# Patient Record
Sex: Female | Born: 1950 | Race: Black or African American | Hispanic: No | Marital: Single | Smoking: Never smoker
Health system: Southern US, Community
[De-identification: ages and names within clinical notes are randomized; demographics above are authoritative.]

## PROBLEM LIST (undated history)

## (undated) DIAGNOSIS — R197 Diarrhea, unspecified: Secondary | ICD-10-CM

## (undated) DIAGNOSIS — E669 Obesity, unspecified: Secondary | ICD-10-CM

## (undated) DIAGNOSIS — G960 Cerebrospinal fluid leak: Secondary | ICD-10-CM

## (undated) DIAGNOSIS — I509 Heart failure, unspecified: Secondary | ICD-10-CM

## (undated) DIAGNOSIS — J189 Pneumonia, unspecified organism: Secondary | ICD-10-CM

## (undated) DIAGNOSIS — E7800 Pure hypercholesterolemia, unspecified: Secondary | ICD-10-CM

## (undated) DIAGNOSIS — G9601 Cranial cerebrospinal fluid leak, spontaneous: Secondary | ICD-10-CM

## (undated) DIAGNOSIS — D3A00 Benign carcinoid tumor of unspecified site: Secondary | ICD-10-CM

## (undated) DIAGNOSIS — D509 Iron deficiency anemia, unspecified: Secondary | ICD-10-CM

## (undated) DIAGNOSIS — N289 Disorder of kidney and ureter, unspecified: Secondary | ICD-10-CM

## (undated) DIAGNOSIS — E119 Type 2 diabetes mellitus without complications: Secondary | ICD-10-CM

## (undated) DIAGNOSIS — R6510 Systemic inflammatory response syndrome (SIRS) of non-infectious origin without acute organ dysfunction: Secondary | ICD-10-CM

## (undated) DIAGNOSIS — I10 Essential (primary) hypertension: Secondary | ICD-10-CM

## (undated) HISTORY — PX: KNEE SURGERY: SHX244

## (undated) HISTORY — PX: ABDOMINAL HYSTERECTOMY: SHX81

## (undated) HISTORY — PX: KNEE RECONSTRUCTION, MEDIAL PATELLAR FEMORAL LIGAMENT: SHX1898

---

## 2010-07-09 ENCOUNTER — Emergency Department (INDEPENDENT_AMBULATORY_CARE_PROVIDER_SITE_OTHER): Payer: Medicare Other

## 2010-07-09 ENCOUNTER — Emergency Department (HOSPITAL_BASED_OUTPATIENT_CLINIC_OR_DEPARTMENT_OTHER)
Admission: EM | Admit: 2010-07-09 | Discharge: 2010-07-09 | Disposition: A | Discharge status: Home / Self Care | Payer: Medicare Other | Attending: Emergency Medicine | Admitting: Emergency Medicine

## 2010-07-09 DIAGNOSIS — M25569 Pain in unspecified knee: Secondary | ICD-10-CM

## 2010-07-09 DIAGNOSIS — I10 Essential (primary) hypertension: Secondary | ICD-10-CM | POA: Insufficient documentation

## 2010-07-09 DIAGNOSIS — M1710 Unilateral primary osteoarthritis, unspecified knee: Secondary | ICD-10-CM

## 2010-07-09 DIAGNOSIS — E119 Type 2 diabetes mellitus without complications: Secondary | ICD-10-CM | POA: Insufficient documentation

## 2010-07-09 DIAGNOSIS — E785 Hyperlipidemia, unspecified: Secondary | ICD-10-CM | POA: Insufficient documentation

## 2010-07-09 DIAGNOSIS — Y92009 Unspecified place in unspecified non-institutional (private) residence as the place of occurrence of the external cause: Secondary | ICD-10-CM | POA: Insufficient documentation

## 2010-07-09 DIAGNOSIS — Z79899 Other long term (current) drug therapy: Secondary | ICD-10-CM | POA: Insufficient documentation

## 2010-07-09 DIAGNOSIS — S8000XA Contusion of unspecified knee, initial encounter: Secondary | ICD-10-CM | POA: Insufficient documentation

## 2010-07-09 DIAGNOSIS — W19XXXA Unspecified fall, initial encounter: Secondary | ICD-10-CM | POA: Insufficient documentation

## 2011-09-24 ENCOUNTER — Encounter (HOSPITAL_COMMUNITY): Payer: Self-pay | Admitting: Emergency Medicine

## 2011-09-24 ENCOUNTER — Emergency Department (HOSPITAL_COMMUNITY): Payer: Medicare (Managed Care)

## 2011-09-24 ENCOUNTER — Emergency Department (HOSPITAL_COMMUNITY)
Admission: EM | Admit: 2011-09-24 | Discharge: 2011-09-24 | Disposition: A | Discharge status: Home / Self Care | Payer: Medicare (Managed Care) | Attending: Emergency Medicine | Admitting: Emergency Medicine

## 2011-09-24 DIAGNOSIS — E119 Type 2 diabetes mellitus without complications: Secondary | ICD-10-CM | POA: Insufficient documentation

## 2011-09-24 DIAGNOSIS — Z794 Long term (current) use of insulin: Secondary | ICD-10-CM | POA: Insufficient documentation

## 2011-09-24 DIAGNOSIS — R609 Edema, unspecified: Secondary | ICD-10-CM | POA: Insufficient documentation

## 2011-09-24 DIAGNOSIS — I10 Essential (primary) hypertension: Secondary | ICD-10-CM | POA: Insufficient documentation

## 2011-09-24 DIAGNOSIS — R0602 Shortness of breath: Secondary | ICD-10-CM | POA: Insufficient documentation

## 2011-09-24 HISTORY — DX: Pure hypercholesterolemia, unspecified: E78.00

## 2011-09-24 HISTORY — DX: Disorder of kidney and ureter, unspecified: N28.9

## 2011-09-24 HISTORY — DX: Essential (primary) hypertension: I10

## 2011-09-24 HISTORY — DX: Pneumonia, unspecified organism: J18.9

## 2011-09-24 HISTORY — DX: Obesity, unspecified: E66.9

## 2011-09-24 LAB — CBC
HCT: 28.1 % — ABNORMAL LOW (ref 36.0–46.0)
Hemoglobin: 8.6 g/dL — ABNORMAL LOW (ref 12.0–15.0)
MCH: 27.8 pg (ref 26.0–34.0)
MCHC: 30.6 g/dL (ref 30.0–36.0)
MCV: 90.9 fL (ref 78.0–100.0)
Platelets: 341 10*3/uL (ref 150–400)
RBC: 3.09 MIL/uL — ABNORMAL LOW (ref 3.87–5.11)
RDW: 14.3 % (ref 11.5–15.5)
WBC: 10 10*3/uL (ref 4.0–10.5)

## 2011-09-24 LAB — BASIC METABOLIC PANEL
BUN: 19 mg/dL (ref 6–23)
CO2: 26 mEq/L (ref 19–32)
Calcium: 9.8 mg/dL (ref 8.4–10.5)
Chloride: 107 mEq/L (ref 96–112)
Creatinine, Ser: 1.01 mg/dL (ref 0.50–1.10)
GFR calc Af Amer: 68 mL/min — ABNORMAL LOW (ref >90)
GFR calc non Af Amer: 58 mL/min — ABNORMAL LOW (ref >90)
Glucose, Bld: 94 mg/dL (ref 70–99)
Potassium: 4.5 mEq/L (ref 3.5–5.1)
Sodium: 145 mEq/L (ref 135–145)

## 2011-09-24 LAB — URINALYSIS, ROUTINE W REFLEX MICROSCOPIC
Bilirubin Urine: NEGATIVE
Glucose, UA: NEGATIVE mg/dL
Hgb urine dipstick: NEGATIVE
Ketones, ur: NEGATIVE mg/dL
Leukocytes, UA: NEGATIVE
Nitrite: NEGATIVE
Protein, ur: NEGATIVE mg/dL
Specific Gravity, Urine: 1.017 (ref 1.005–1.030)
Urobilinogen, UA: 0.2 mg/dL (ref 0.0–1.0)
pH: 5.5 (ref 5.0–8.0)

## 2011-09-24 LAB — POCT I-STAT TROPONIN I: Troponin i, poc: 0 ng/mL (ref 0.00–0.08)

## 2011-09-24 MED ORDER — MOXIFLOXACIN HCL 400 MG PO TABS
400.0000 mg | ORAL_TABLET | Freq: Once | ORAL | Status: AC
  Administered 2011-09-24: 400 mg via ORAL
  Filled 2011-09-24: qty 1

## 2011-09-24 MED ORDER — IPRATROPIUM BROMIDE 0.02 % IN SOLN
0.5000 mg | Freq: Once | RESPIRATORY_TRACT | Status: AC
  Administered 2011-09-24: 0.5 mg via RESPIRATORY_TRACT
  Filled 2011-09-24: qty 2.5

## 2011-09-24 MED ORDER — ALBUTEROL SULFATE (5 MG/ML) 0.5% IN NEBU
5.0000 mg | INHALATION_SOLUTION | Freq: Once | RESPIRATORY_TRACT | Status: AC
  Administered 2011-09-24: 5 mg via RESPIRATORY_TRACT
  Filled 2011-09-24: qty 1

## 2011-09-24 MED ORDER — FUROSEMIDE 10 MG/ML IJ SOLN
80.0000 mg | Freq: Once | INTRAMUSCULAR | Status: AC
  Administered 2011-09-24: 80 mg via INTRAMUSCULAR
  Filled 2011-09-24: qty 8

## 2011-09-24 MED ORDER — ALBUTEROL SULFATE (5 MG/ML) 0.5% IN NEBU
INHALATION_SOLUTION | RESPIRATORY_TRACT | Status: AC
  Administered 2011-09-24: 12:00:00
  Filled 2011-09-24: qty 1

## 2011-09-24 MED ORDER — MOXIFLOXACIN HCL 400 MG PO TABS: 400.0000 mg | ORAL_TABLET | Freq: Every day | ORAL | Status: DC

## 2011-09-24 NOTE — ED Provider Notes (Signed)
History     CSN: [id number]  Arrival date & time 09/24/11  1114   First MD Initiated Contact with Patient 09/24/11 1121      Chief Complaint  Patient presents with  . Shortness of Breath    (Consider location/radiation/quality/duration/timing/severity/associated sxs/prior treatment) HPI  The patient is a 61 yo morbidly obese female that presents with SOB.  The patient is normally on a oxygen tank at home which stopped working this morning.  The patient has a history of diabetes, hypertension, CHF, and recent acute renal failure.  The patient has only taken her long acting insulin medication today.  Patient reports some abdominal "pressure" today and leg swelling.   She denies fever, chest pain, headache, nausea, vomiting,cough, constipation, urinary symptoms, and leg pain.   Past Medical History  Diagnosis Date  . Pneumonia   . Renal disorder     renal failure  . Obesity   . Hypertension   . Diabetes mellitus   . High cholesterol     History reviewed. No pertinent past surgical history.  History reviewed. No pertinent family history.  History  Substance Use Topics  . Smoking status: Not on file  . Smokeless tobacco: Not on file  . Alcohol Use:     OB History    Grav Para Term Preterm Abortions TAB SAB Ect Mult Living                  Review of Systems  All other systems negative except as documented in the HPI. All pertinent positives and negatives as reviewed in the HPI.   Allergies  Penicillins  Home Medications  No current outpatient prescriptions on file.  There were no vitals taken for this visit.  Physical Exam  Constitutional: She is oriented to person, place, and time.       The patient is morbidly obese.   HENT:  Head: Normocephalic and atraumatic.  Nose: Nose normal.  Mouth/Throat: Oropharynx is clear and moist.  Eyes: Conjunctivae normal and EOM are normal. Pupils are equal, round, and reactive to light.  Neck: Normal range of motion. No  thyromegaly present.  Cardiovascular: Normal rate, regular rhythm and normal heart sounds.   Pulses:      Carotid pulses are 2+ on the right side, and 2+ on the left side.      Radial pulses are 2+ on the right side, and 2+ on the left side.       Posterior tibial pulses are 1+ on the right side, and 1+ on the left side.  Pulmonary/Chest: No accessory muscle usage. Not tachypneic. No respiratory distress.       Unable to auscultate posterior chest due to lack of patient mobility.  Abdominal: Soft. Bowel sounds are normal. She exhibits no distension and no mass. There is no tenderness. There is no rebound and no guarding.  Musculoskeletal:       2+ pitting edema in bilateral lower extremities  Lymphadenopathy:    She has no cervical adenopathy.  Neurological: She is alert and oriented to person, place, and time.  Skin: Skin is warm. No rash noted. No erythema.  Psychiatric: She has a normal mood and affect. Her behavior is normal. Judgment and thought content normal.      ED Course  Procedures (including critical care time)  Patient has been stable during her ER visit.  The patient is given Avelox due to the fact that there is concern that this could be atelectasis versus pneumonia.  Patient, states she feels, that she is at her normal baseline.  Patient's on 3 L of oxygen at home with rest and 5 L when walking.  Patient family is advised to return to the emergency department for any worsening in her condition.    MDM  MDM Reviewed: nursing note and vitals Interpretation: labs, ECG and x-ray    Date: 09/24/2011  Rate:66  Rhythm: normal sinus rhythm  QRS Axis: normal  Intervals: normal  ST/T Wave abnormalities: nonspecific T wave changes  Conduction Disutrbances:none  Narrative Interpretation:   Old EKG Reviewed: none available           Carlyle Dolly, PA-C 09/24/11 2031

## 2011-09-24 NOTE — ED Notes (Signed)
Main lab phlebotomy called to assess pt for lab draw.

## 2011-09-24 NOTE — ED Notes (Signed)
Main lab collected blood. PA aware of delay.

## 2011-09-24 NOTE — ED Notes (Signed)
IV team unable to start IV. PA to be notified.

## 2011-09-24 NOTE — ED Notes (Signed)
Unsuccessfully attempted to obtain blood for labs x2.RN Stark Jock made aware

## 2011-09-24 NOTE — ED Notes (Signed)
EMS reports pt O2 tank at hom  Not working. saO2 at 86%. Pt complaint of  SOB.

## 2011-09-24 NOTE — ED Notes (Signed)
IV team at bedside. Pt having conversation with family at bedside. States that breathing is better. States it gets worse with slight activity.

## 2011-09-24 NOTE — ED Notes (Signed)
ZOX:[id number]<AV> Expected date:<BR> Expected time:<BR> Means of arrival:Ambulance<BR> Comments:<BR> O2 not working this am; sob; pitting edema to BLE; &gt;500lbs

## 2011-09-24 NOTE — ED Notes (Signed)
Pt beginning to have increasing SOB, Thayer Ohm PA notified. Will continue to monitor pt for changes. IV team paged to start IV due to 2 unsuccessful attempts by RN.

## 2011-09-24 NOTE — ED Notes (Signed)
[facility] contacted to transport pt home.  

## 2011-09-24 NOTE — ED Notes (Signed)
IV team with return page. IV RN on way to assess pt for IV.

## 2011-09-26 NOTE — ED Provider Notes (Signed)
Medical screening examination/treatment/procedure(s) were performed by non-physician practitioner and as supervising physician I was immediately available for consultation/collaboration.   Gavin Pound. Oletta Lamas, MD 09/26/11 2133

## 2011-10-29 ENCOUNTER — Emergency Department (HOSPITAL_COMMUNITY): Payer: Medicare (Managed Care)

## 2011-10-29 ENCOUNTER — Emergency Department (HOSPITAL_COMMUNITY)
Admission: EM | Admit: 2011-10-29 | Discharge: 2011-10-29 | Disposition: A | Discharge status: Home / Self Care | Payer: Medicare (Managed Care) | Attending: Emergency Medicine | Admitting: Emergency Medicine

## 2011-10-29 ENCOUNTER — Encounter (HOSPITAL_COMMUNITY): Payer: Self-pay | Admitting: *Deleted

## 2011-10-29 DIAGNOSIS — I10 Essential (primary) hypertension: Secondary | ICD-10-CM | POA: Insufficient documentation

## 2011-10-29 DIAGNOSIS — Z794 Long term (current) use of insulin: Secondary | ICD-10-CM | POA: Insufficient documentation

## 2011-10-29 DIAGNOSIS — Z9889 Other specified postprocedural states: Secondary | ICD-10-CM | POA: Insufficient documentation

## 2011-10-29 DIAGNOSIS — Z862 Personal history of diseases of the blood and blood-forming organs and certain disorders involving the immune mechanism: Secondary | ICD-10-CM | POA: Insufficient documentation

## 2011-10-29 DIAGNOSIS — R609 Edema, unspecified: Secondary | ICD-10-CM | POA: Insufficient documentation

## 2011-10-29 DIAGNOSIS — E7800 Pure hypercholesterolemia, unspecified: Secondary | ICD-10-CM | POA: Insufficient documentation

## 2011-10-29 DIAGNOSIS — Z79899 Other long term (current) drug therapy: Secondary | ICD-10-CM | POA: Insufficient documentation

## 2011-10-29 DIAGNOSIS — Z8701 Personal history of pneumonia (recurrent): Secondary | ICD-10-CM | POA: Insufficient documentation

## 2011-10-29 DIAGNOSIS — Z90710 Acquired absence of both cervix and uterus: Secondary | ICD-10-CM | POA: Insufficient documentation

## 2011-10-29 DIAGNOSIS — R0602 Shortness of breath: Secondary | ICD-10-CM | POA: Insufficient documentation

## 2011-10-29 DIAGNOSIS — Z8639 Personal history of other endocrine, nutritional and metabolic disease: Secondary | ICD-10-CM | POA: Insufficient documentation

## 2011-10-29 DIAGNOSIS — Z9981 Dependence on supplemental oxygen: Secondary | ICD-10-CM | POA: Insufficient documentation

## 2011-10-29 DIAGNOSIS — E119 Type 2 diabetes mellitus without complications: Secondary | ICD-10-CM | POA: Insufficient documentation

## 2011-10-29 LAB — URINALYSIS, MICROSCOPIC ONLY
Leukocytes, UA: NEGATIVE
Nitrite: NEGATIVE
Specific Gravity, Urine: 1.018 (ref 1.005–1.030)
Urobilinogen, UA: 1 mg/dL (ref 0.0–1.0)

## 2011-10-29 MED ORDER — FUROSEMIDE 10 MG/ML IJ SOLN
100.0000 mg | Freq: Once | INTRAMUSCULAR | Status: DC
  Filled 2011-10-29: qty 10

## 2011-10-29 MED ORDER — FUROSEMIDE 40 MG PO TABS: 40.0000 mg | ORAL_TABLET | Freq: Every day | ORAL | Status: AC

## 2011-10-29 MED ORDER — FUROSEMIDE 80 MG PO TABS
100.0000 mg | ORAL_TABLET | Freq: Once | ORAL | Status: AC
  Administered 2011-10-29: 100 mg via ORAL
  Filled 2011-10-29: qty 1

## 2011-10-29 NOTE — ED Notes (Signed)
[facility] here to transfer pt. 5-6 staff assist

## 2011-10-29 NOTE — ED Provider Notes (Signed)
History     CSN: [id number]  Arrival date & time 10/29/11  1145   First MD Initiated Contact with Patient 10/29/11 1310      Chief Complaint  Patient presents with  . Shortness of Breath    (Consider location/radiation/quality/duration/timing/severity/associated sxs/prior treatment) Patient is a 60 y.o. female presenting with shortness of breath. The history is provided by the patient and a friend. No language interpreter was used.  Shortness of Breath  The current episode started 2 days ago. The onset was gradual. The problem occurs continuously. The problem has been gradually worsening. The problem is moderate. The symptoms are relieved by rest. The symptoms are aggravated by activity. Associated symptoms include shortness of breath. Pertinent negatives include no chest pain, no chest pressure, no fever, no cough and no wheezing. She was not exposed to toxic fumes. Urine output has been normal. The last void occurred less than 6 hours ago. Recently, medical care has been given at another facility (home health ).   61 year old morbidly obese female coming in with increased shortness of breath and no chest pain. States that she is on 40 of Lasix daily and reports edema is worsened over the last 4 days. States that she has had none of her Lasix today. States that Dr. Redmond School is her doctor he came to her house 3 weeks ago. She has had PT OT home health  3 times a week. She does have a nurse to come to the house. No acute distress noted we will order labs and x-ray. Past medical history listed below. O2 sats 90 % on r/a.  Patient is on 3L  at home.    Past Medical History  Diagnosis Date  . Pneumonia   . Renal disorder     renal failure  . Obesity   . Hypertension   . Diabetes mellitus   . High cholesterol     Past Surgical History  Procedure Date  . Abdominal hysterectomy   . Knee reconstruction, medial patellar femoral ligament     No family history on file.  History    Substance Use Topics  . Smoking status: Never Smoker   . Smokeless tobacco: Never Used  . Alcohol Use: No    OB History    Grav Para Term Preterm Abortions TAB SAB Ect Mult Living                  Review of Systems  Constitutional: Negative.  Negative for fever.  HENT: Negative.   Eyes: Negative.   Respiratory: Positive for shortness of breath. Negative for cough and wheezing.   Cardiovascular: Positive for leg swelling. Negative for chest pain and palpitations.  Gastrointestinal: Negative.  Negative for abdominal distention.  Musculoskeletal: Negative.   Skin: Negative.   Neurological: Negative.  Negative for dizziness and light-headedness.  Psychiatric/Behavioral: Negative.   All other systems reviewed and are negative.    Allergies  Penicillins  Home Medications   Current Outpatient Rx  Name Route Sig Dispense Refill  . ALBUTEROL SULFATE HFA 108 (90 BASE) MCG/ACT IN AERS Inhalation Inhale 2 puffs into the lungs every 6 (six) hours as needed. breathing    . AMLODIPINE BESY-BENAZEPRIL HCL 10-20 MG PO CAPS Oral Take 1 capsule by mouth daily.    . ATENOLOL 50 MG PO TABS Oral Take 50 mg by mouth daily.    . BECLOMETHASONE DIPROPIONATE 80 MCG/ACT IN AERS Inhalation Inhale 1 puff into the lungs as needed. breathing    . VITAMIN  D3 2000 UNITS PO TABS Oral Take 1 tablet by mouth daily.    . FUROSEMIDE 40 MG PO TABS Oral Take 40 mg by mouth daily.    Marland Kitchen GABAPENTIN 400 MG PO CAPS Oral Take 400 mg by mouth 3 (three) times daily.    Marland Kitchen HYDRALAZINE HCL 25 MG PO TABS Oral Take 25 mg by mouth 3 (three) times daily.    . INSULIN ASPART 100 UNIT/ML [place] SOLN Subcutaneous Inject 40-50 Units into the skin 3 (three) times daily before meals. Inject 50 units at breakfast and 40 units at  lunch and 40 units at dinner. Pt on sliding scale    . INSULIN GLARGINE 100 UNIT/ML [place] SOLN Subcutaneous Inject 50 Units into the skin 2 (two) times daily. 50 units at breakfast and 50 units at bed time    .  PRAVASTATIN SODIUM 20 MG PO TABS Oral Take 20 mg by mouth daily.    Marland Kitchen SPIRONOLACTONE 25 MG PO TABS Oral Take 25 mg by mouth daily.      BP 133/105  Pulse 51  SpO2 96%  Physical Exam  Nursing note and vitals reviewed. Constitutional: She is oriented to person, place, and time. She appears well-developed and well-nourished.       Morbid obese  HENT:  Head: Normocephalic and atraumatic.  Eyes: Conjunctivae normal and EOM are normal. Pupils are equal, round, and reactive to light.  Neck: Normal range of motion. Neck supple.  Cardiovascular: Normal rate.   Pulmonary/Chest: Effort normal and breath sounds normal.  Abdominal: Soft. Bowel sounds are normal.  Musculoskeletal: Normal range of motion. She exhibits no edema and no tenderness.  Neurological: She is alert and oriented to person, place, and time. She has normal reflexes.  Skin: Skin is warm and dry.  Psychiatric: She has a normal mood and affect.    ED Course  Procedures (including critical care time) Patient is maintaining on 3L  in the ER.  100mg  lasix po with good results.  Case manager at bedside discussing home care options.  Will notify Dr. Redmond School of patients visit to er and followup tomorrow.   Labs Reviewed  URINALYSIS, MICROSCOPIC ONLY - Abnormal; Notable for the following:    APPearance CLOUDY (*)     Bacteria, UA FEW (*)     All other components within normal limits   Dg Chest 2 View  10/29/2011  *RADIOLOGY REPORT*  Clinical Data: Short of breath  CHEST - 2 VIEW  Comparison: 09/24/2011  Findings: The study is significantly limited by obesity.  The heart is enlarged.  There is vascular congestion and bilateral airspace disease compatible with pulmonary edema.  This is unchanged.  No significant pleural effusion  IMPRESSION: .  Congestive heart failure with edema, unchanged   Original Report Authenticated By: Camelia Phenes, M.D.      No diagnosis found.    MDM   61yo mobildy obese with SOB.  Chest x-ray  reviewed by myself and Dr. Adriana Simas shows CHF that is unchanged since last ER visit.  Unable to draw labs or insert IV due to body habitus.  Will increase her lasix.  100mg  po in the ER today ( pharmacist does not recommend 100mg  IM) with insertion of foley catheter per patient request.  Home health notified to see tomorrow and dc foley. Orders faxed to home health nurse.  Dr. Redmond School to see tomorrow. Patient and daughter Spoke with Case manager and discussed need for Nursing home placement and possible CHF clinic.  Will increase her home dose of lasix to  80mg  am and 40mg  pm.  U/a unremarkable. EKG unchanged.    Date: 10/30/2011  Rate: 51  Rhythm: sinus bradycardia  QRS Axis: normal  Intervals: normal  ST/T Wave abnormalities: normal  Conduction Disutrbances:none  Narrative Interpretation:   Old EKG Reviewed: unchanged Labs Reviewed  URINALYSIS, MICROSCOPIC ONLY - Abnormal; Notable for the following:    APPearance CLOUDY (*)     Bacteria, UA FEW (*)     All other components within normal limits  LAB REPORT - SCANNED            Remi Haggard, NP 10/30/11 1111

## 2011-10-29 NOTE — ED Notes (Signed)
Daughter has to go back to work and wishes to be contacted at [phone]-Cell Celine Ahr or work-- [phone] ext [phone] with any updates

## 2011-10-29 NOTE — ED Notes (Signed)
ZOX:WR60<AV> Expected date:<BR> Expected time:<BR> Means of arrival:<BR> Comments:<BR> ems 40

## 2011-10-29 NOTE — Progress Notes (Signed)
[facility] ED CM received a call from Rich Fuchs home care coordinator, about concerns with pt d/c home status Debbie reports [place] received referral for pt from [facility] without a pcp [place] assisted pt to get a pcp, henry tripp.  Pt has not been weighed by [place] CM spoke with debbie, Lesli Albee and [facility] therapistat [phone] to hear the concerns about pt. Reviewed EDP impression/evaluation of pt (not finding a reason for admission)  Cm inquired about pt solid and fluid intake and inquired about whether pt in need of higher level of care.  The therapist states pt was walking with minimal to supervision assist but now pt not able to bring hand up to her face or lift arm. Cm was informed that pt has a daughter that works 7a-7p and visits pt in evening [place] staff reporting pt living alone with [facility], PT/OT, aide and SW.  Genevieve Norlander States HH SW has worked with Dr tripp with coordination of care but prsent staff unaware if snf placement/higher level of care has been address [facility] ED Cm request attempt to weigh pt prior to d/c by nursing staff (CNA), informed incoming EDP, Effie Shy and will place a call to Dr tripp office requesting assistance from community for higher level of care

## 2011-10-29 NOTE — Progress Notes (Signed)
Cm consulted by patient advocate and CNA to speak with pt and her daughter.  Ann, NP present during CM interaction with pt and daughter Dewayne Hatch reviewed ED d/c plan of care- increase of lasix removal of foley on 10/30/11 (order written to give to Vernona Rieger, RN) Daughter and Dewayne Hatch reports speaking with Vernona Rieger in pt's room CM reviewed with them the concerns voiced by Eliberto Ivory and [place] including possible need for increase level of care Pt stated she did not want to go to snf.  Cm reviewed different length of stay levels of snf (2-3 weeks, 2-3 months and residential)  Cm reviewed wnl labs and secondary review of labs, imaging by EDP.  Discussed changes and recommendations for nutrition in regards to possible cause of increase in edema Daughter stated [facility] appointment for CHF clinic was cancelled CM and NP encouraged re scheduling this appointment for management of CHF, decrease in edema, medication management and nutrition management.  Daughter confirms she works 7am to 7 pm CM and NP encouraged pt to consider snf placement level of care for brief stay until better.  Pt states when last weigh she was >400 lbs.  Confirmed Dr Redmond School, home visit provider is pcp Cm will send ED visit clinicals and leave message providing ED visit information Cm will request Pcp staff contact Cm for further questions

## 2011-10-29 NOTE — Progress Notes (Signed)
CM received return call from Dr Redmond School and Dondra Spry to review pt information Clinicals faxed to [phone] Dr Redmond School states he will f/u

## 2011-10-29 NOTE — ED Notes (Signed)
[facility] called for pt transportation home 

## 2011-10-29 NOTE — ED Notes (Signed)
Attempted IV start x1. Unsuccessful. Due to extremity swelling, IV team paged. Awaiting return response.

## 2011-10-29 NOTE — ED Notes (Signed)
Pt reports exertional sob and extremity swelling, worse over past weekend. Hx COPD, when asked about CHF "they mentioned something about it last time I was here, but never told me for sure if I had it." Normally on 3L O2 at home, daughter increased it to 4L yesterday. Pt requiring 5L on arrival to dept during moving from ems to ED stretcher.

## 2011-10-29 NOTE — ED Notes (Addendum)
IV RN x2 unable to obtain IV start. Yesenia Lyons, EDP made aware. Verbal order given for PICC line insertion. Order placed. IV RN made aware.

## 2011-10-31 NOTE — ED Provider Notes (Signed)
Medical screening examination/treatment/procedure(s) were conducted as a shared visit with non-physician practitioner(s) and myself.  I personally evaluated the patient during the encounter.  Patient is morbidly obese.  Chest x-ray shows stable congestive heart failure.  IV access difficult to obtain.  Patient is high risk for central line placement.  Will give IM Lasix and increase by mouth Lasix.  Patient is not in extremis  Donnetta Hutching, MD 10/31/11 2309

## 2011-11-12 ENCOUNTER — Encounter (HOSPITAL_COMMUNITY): Payer: Medicare (Managed Care)

## 2013-03-07 DIAGNOSIS — R6510 Systemic inflammatory response syndrome (SIRS) of non-infectious origin without acute organ dysfunction: Secondary | ICD-10-CM

## 2013-03-07 DIAGNOSIS — D509 Iron deficiency anemia, unspecified: Secondary | ICD-10-CM

## 2013-03-07 HISTORY — DX: Iron deficiency anemia, unspecified: D50.9

## 2013-03-07 HISTORY — DX: Systemic inflammatory response syndrome (sirs) of non-infectious origin without acute organ dysfunction: R65.10

## 2013-03-29 ENCOUNTER — Encounter (HOSPITAL_COMMUNITY): Payer: Self-pay | Admitting: Emergency Medicine

## 2013-03-29 ENCOUNTER — Emergency Department (HOSPITAL_COMMUNITY): Payer: Medicare (Managed Care)

## 2013-03-29 ENCOUNTER — Inpatient Hospital Stay (HOSPITAL_COMMUNITY)
Admission: EM | Admit: 2013-03-29 | Discharge: 2013-04-01 | DRG: 643 | Disposition: A | Discharge status: Home Health | Payer: Medicare (Managed Care) | Attending: Internal Medicine | Admitting: Internal Medicine

## 2013-03-29 ENCOUNTER — Inpatient Hospital Stay (HOSPITAL_COMMUNITY): Payer: Medicare (Managed Care)

## 2013-03-29 DIAGNOSIS — R1909 Other intra-abdominal and pelvic swelling, mass and lump: Secondary | ICD-10-CM | POA: Diagnosis present

## 2013-03-29 DIAGNOSIS — R599 Enlarged lymph nodes, unspecified: Secondary | ICD-10-CM | POA: Diagnosis present

## 2013-03-29 DIAGNOSIS — K7689 Other specified diseases of liver: Secondary | ICD-10-CM | POA: Diagnosis present

## 2013-03-29 DIAGNOSIS — I509 Heart failure, unspecified: Secondary | ICD-10-CM

## 2013-03-29 DIAGNOSIS — E669 Obesity, unspecified: Secondary | ICD-10-CM | POA: Diagnosis present

## 2013-03-29 DIAGNOSIS — D509 Iron deficiency anemia, unspecified: Secondary | ICD-10-CM | POA: Diagnosis present

## 2013-03-29 DIAGNOSIS — Z79899 Other long term (current) drug therapy: Secondary | ICD-10-CM

## 2013-03-29 DIAGNOSIS — Z6841 Body Mass Index (BMI) 40.0 and over, adult: Secondary | ICD-10-CM

## 2013-03-29 DIAGNOSIS — E43 Unspecified severe protein-calorie malnutrition: Secondary | ICD-10-CM | POA: Diagnosis present

## 2013-03-29 DIAGNOSIS — R634 Abnormal weight loss: Secondary | ICD-10-CM | POA: Diagnosis present

## 2013-03-29 DIAGNOSIS — Z880 Allergy status to penicillin: Secondary | ICD-10-CM

## 2013-03-29 DIAGNOSIS — Z794 Long term (current) use of insulin: Secondary | ICD-10-CM

## 2013-03-29 DIAGNOSIS — R6510 Systemic inflammatory response syndrome (SIRS) of non-infectious origin without acute organ dysfunction: Secondary | ICD-10-CM | POA: Diagnosis present

## 2013-03-29 DIAGNOSIS — R509 Fever, unspecified: Secondary | ICD-10-CM

## 2013-03-29 DIAGNOSIS — E119 Type 2 diabetes mellitus without complications: Secondary | ICD-10-CM

## 2013-03-29 DIAGNOSIS — I10 Essential (primary) hypertension: Secondary | ICD-10-CM

## 2013-03-29 DIAGNOSIS — R109 Unspecified abdominal pain: Secondary | ICD-10-CM

## 2013-03-29 DIAGNOSIS — E279 Disorder of adrenal gland, unspecified: Principal | ICD-10-CM | POA: Diagnosis present

## 2013-03-29 DIAGNOSIS — D72829 Elevated white blood cell count, unspecified: Secondary | ICD-10-CM | POA: Diagnosis present

## 2013-03-29 DIAGNOSIS — E1169 Type 2 diabetes mellitus with other specified complication: Secondary | ICD-10-CM | POA: Diagnosis present

## 2013-03-29 DIAGNOSIS — E7800 Pure hypercholesterolemia, unspecified: Secondary | ICD-10-CM | POA: Diagnosis present

## 2013-03-29 HISTORY — DX: Heart failure, unspecified: I50.9

## 2013-03-29 LAB — BASIC METABOLIC PANEL
BUN: 19 mg/dL (ref 6–23)
CHLORIDE: 96 meq/L (ref 96–112)
CO2: 29 mEq/L (ref 19–32)
CREATININE: 0.99 mg/dL (ref 0.50–1.10)
Calcium: 9.3 mg/dL (ref 8.4–10.5)
GFR calc Af Amer: 69 mL/min — ABNORMAL LOW (ref >90)
GFR, EST NON AFRICAN AMERICAN: 59 mL/min — AB (ref >90)
GLUCOSE: 272 mg/dL — AB (ref 70–99)
POTASSIUM: 4.4 meq/L (ref 3.7–5.3)
Sodium: 138 mEq/L (ref 137–147)

## 2013-03-29 LAB — I-STAT CG4 LACTIC ACID, ED: Lactic Acid, Venous: 1.13 mmol/L (ref 0.5–2.2)

## 2013-03-29 LAB — CBC WITH DIFFERENTIAL/PLATELET
Basophils Absolute: 0 10*3/uL (ref 0.0–0.1)
Basophils Relative: 0 % (ref 0–1)
EOS ABS: 0 10*3/uL (ref 0.0–0.7)
EOS PCT: 0 % (ref 0–5)
HEMATOCRIT: 28.7 % — AB (ref 36.0–46.0)
HEMOGLOBIN: 8.5 g/dL — AB (ref 12.0–15.0)
LYMPHS ABS: 1.3 10*3/uL (ref 0.7–4.0)
LYMPHS PCT: 7 % — AB (ref 12–46)
MCH: 24.6 pg — AB (ref 26.0–34.0)
MCHC: 29.6 g/dL — ABNORMAL LOW (ref 30.0–36.0)
MCV: 82.9 fL (ref 78.0–100.0)
MONO ABS: 2.6 10*3/uL — AB (ref 0.1–1.0)
MONOS PCT: 14 % — AB (ref 3–12)
Neutro Abs: 14.4 10*3/uL — ABNORMAL HIGH (ref 1.7–7.7)
Neutrophils Relative %: 79 % — ABNORMAL HIGH (ref 43–77)
PLATELETS: 463 10*3/uL — AB (ref 150–400)
RBC: 3.46 MIL/uL — AB (ref 3.87–5.11)
RDW: 15.2 % (ref 11.5–15.5)
WBC: 18.4 10*3/uL — AB (ref 4.0–10.5)

## 2013-03-29 LAB — URINALYSIS, ROUTINE W REFLEX MICROSCOPIC
Glucose, UA: NEGATIVE mg/dL
HGB URINE DIPSTICK: NEGATIVE
KETONES UR: NEGATIVE mg/dL
Leukocytes, UA: NEGATIVE
Nitrite: NEGATIVE
PH: 6 (ref 5.0–8.0)
Protein, ur: NEGATIVE mg/dL
SPECIFIC GRAVITY, URINE: 1.015 (ref 1.005–1.030)
Urobilinogen, UA: 0.2 mg/dL (ref 0.0–1.0)

## 2013-03-29 LAB — GLUCOSE, CAPILLARY
Glucose-Capillary: 197 mg/dL — ABNORMAL HIGH (ref 70–99)
Glucose-Capillary: 202 mg/dL — ABNORMAL HIGH (ref 70–99)

## 2013-03-29 LAB — PRO B NATRIURETIC PEPTIDE: Pro B Natriuretic peptide (BNP): 399.1 pg/mL — ABNORMAL HIGH (ref 0–125)

## 2013-03-29 MED ORDER — GABAPENTIN 400 MG PO CAPS
400.0000 mg | ORAL_CAPSULE | Freq: Three times a day (TID) | ORAL | Status: DC
  Administered 2013-03-29 – 2013-04-01 (×6): 400 mg via ORAL
  Filled 2013-03-29 (×10): qty 1

## 2013-03-29 MED ORDER — FUROSEMIDE 10 MG/ML IJ SOLN
40.0000 mg | Freq: Every day | INTRAMUSCULAR | Status: DC
  Administered 2013-03-29: 40 mg via INTRAVENOUS
  Filled 2013-03-29 (×2): qty 4

## 2013-03-29 MED ORDER — FLUTICASONE PROPIONATE HFA 44 MCG/ACT IN AERO
1.0000 | INHALATION_SPRAY | Freq: Two times a day (BID) | RESPIRATORY_TRACT | Status: DC
  Administered 2013-03-29 – 2013-04-01 (×5): 1 via RESPIRATORY_TRACT
  Filled 2013-03-29: qty 10.6

## 2013-03-29 MED ORDER — ALBUTEROL SULFATE HFA 108 (90 BASE) MCG/ACT IN AERS: 2.0000 | INHALATION_SPRAY | Freq: Four times a day (QID) | RESPIRATORY_TRACT | Status: DC | PRN

## 2013-03-29 MED ORDER — IOHEXOL 300 MG/ML  SOLN
50.0000 mL | Freq: Once | INTRAMUSCULAR | Status: AC | PRN
  Administered 2013-03-29: 50 mL via ORAL

## 2013-03-29 MED ORDER — INSULIN ASPART 100 UNIT/ML ~~LOC~~ SOLN
0.0000 [IU] | Freq: Three times a day (TID) | SUBCUTANEOUS | Status: DC
  Administered 2013-03-29: 3 [IU] via SUBCUTANEOUS
  Administered 2013-03-30: 2 [IU] via SUBCUTANEOUS
  Administered 2013-03-30: 8 [IU] via SUBCUTANEOUS
  Administered 2013-03-31 – 2013-04-01 (×4): 3 [IU] via SUBCUTANEOUS

## 2013-03-29 MED ORDER — ONDANSETRON HCL 4 MG/2ML IJ SOLN
4.0000 mg | Freq: Four times a day (QID) | INTRAMUSCULAR | Status: DC | PRN
Start: 2013-03-29 — End: 2013-04-01

## 2013-03-29 MED ORDER — HEPARIN SODIUM (PORCINE) 5000 UNIT/ML IJ SOLN
5000.0000 [IU] | Freq: Three times a day (TID) | INTRAMUSCULAR | Status: DC
  Administered 2013-03-29 – 2013-04-01 (×3): 5000 [IU] via SUBCUTANEOUS
  Filled 2013-03-29 (×11): qty 1

## 2013-03-29 MED ORDER — ONDANSETRON HCL 4 MG PO TABS
4.0000 mg | ORAL_TABLET | Freq: Four times a day (QID) | ORAL | Status: DC | PRN
Start: 2013-03-29 — End: 2013-04-01

## 2013-03-29 MED ORDER — MORPHINE SULFATE 2 MG/ML IJ SOLN: 2.0000 mg | INTRAMUSCULAR | Status: DC | PRN

## 2013-03-29 MED ORDER — BENAZEPRIL HCL 20 MG PO TABS
20.0000 mg | ORAL_TABLET | Freq: Every day | ORAL | Status: DC
  Administered 2013-03-29 – 2013-04-01 (×3): 20 mg via ORAL
  Filled 2013-03-29 (×4): qty 1

## 2013-03-29 MED ORDER — ACETAMINOPHEN 325 MG PO TABS
650.0000 mg | ORAL_TABLET | Freq: Four times a day (QID) | ORAL | Status: DC | PRN
Start: 2013-03-29 — End: 2013-04-01
  Administered 2013-03-29 – 2013-03-30 (×2): 650 mg via ORAL
  Filled 2013-03-29 (×2): qty 2

## 2013-03-29 MED ORDER — ATENOLOL 50 MG PO TABS
50.0000 mg | ORAL_TABLET | Freq: Every day | ORAL | Status: DC
  Administered 2013-03-29 – 2013-04-01 (×3): 50 mg via ORAL
  Filled 2013-03-29 (×4): qty 1

## 2013-03-29 MED ORDER — AMLODIPINE BESYLATE 10 MG PO TABS
10.0000 mg | ORAL_TABLET | Freq: Every day | ORAL | Status: DC
  Administered 2013-03-29 – 2013-04-01 (×3): 10 mg via ORAL
  Filled 2013-03-29 (×4): qty 1

## 2013-03-29 MED ORDER — ACETAMINOPHEN 650 MG RE SUPP: 650.0000 mg | Freq: Four times a day (QID) | RECTAL | Status: DC | PRN

## 2013-03-29 NOTE — H&P (Signed)
[facility] History and Physical  Yesenia Lyons XHB:[id number] DOB: 09-Dec-1950 DOA: 03/29/2013  Referring physician: Emergency Department PCP: Reymundo Poll, MD  Specialists:   Chief Complaint: Abd pain, fever, weight loss  HPI: Yesenia Lyons is a 63 y.o. female  With a hx of chf, HTN, DM, and obesity who presents to the ED with a hx of progressive wt loss, decreased appetite, and generalized abd pain. In the ed, the patient was noted to have a temp of 100.7 with a leukocytosis of 18k. Urinalysis was unremarkable. A cxr was suggestive of pulm vascular congestion. A ct abd was ordered, currently pending, and the hospitalist service consulted for consideration for admission. Yesenia Lyons's daughter is at bedside, who is POA. Per POA, Yesenia Lyons does appear more confused. Suspicion that Yesenia Lyons has not been taking her meds as she should (including lasix). Yesenia Lyons otherwise reports early satiety, intermittent constipation, decreased appetite, and increased sob from baseline.  Review of Systems:  Per above, the remainder of the 10pt ros reviewed and are neg  Past Medical History  Diagnosis Date  . Pneumonia   . Renal disorder     renal failure  . Obesity   . Hypertension   . Diabetes mellitus   . High cholesterol   . CHF (congestive heart failure)    Past Surgical History  Procedure Laterality Date  . Abdominal hysterectomy    . Knee reconstruction, medial patellar femoral ligament     Social History:  reports that she has never smoked. She has never used smokeless tobacco. She reports that she does not drink alcohol or use illicit drugs.  where does patient live--home, ALF, SNF? and with whom if at home?  Can patient participate in ADLs?  Allergies  Allergen Reactions  . Penicillins Itching and Swelling    History reviewed. No pertinent family history.  (be sure to complete)  Prior to Admission medications   Medication Sig Start Date End Date Taking? Authorizing Provider  acetaminophen (TYLENOL) 650 MG  CR tablet Take 650 mg by mouth every 8 (eight) hours as needed for pain.   Yes Historical Provider, MD  amLODipine-benazepril (LOTREL) 10-20 MG per capsule Take 1 capsule by mouth daily.   Yes Historical Provider, MD  atenolol (TENORMIN) 50 MG tablet Take 50 mg by mouth daily.   Yes Historical Provider, MD  beclomethasone (QVAR) 80 MCG/ACT inhaler Inhale 1 puff into the lungs as needed. breathing   Yes Historical Provider, MD  Chromium-Cinnamon (CINNAMON PLUS CHROMIUM) [phone] MCG-MG CAPS Take 1 capsule by mouth daily.   Yes Historical Provider, MD  Cranberry (HM CRANBERRY SUPER STRENGTH) 300 MG tablet Take 300 mg by mouth 2 (two) times daily.   Yes Historical Provider, MD  furosemide (LASIX) 40 MG tablet Take 1 tablet (40 mg total) by mouth daily. 10/29/11  Yes Julieta Bellini, NP  gabapentin (NEURONTIN) 400 MG capsule Take 400 mg by mouth 3 (three) times daily.   Yes Historical Provider, MD  Ginger, Zingiber officinalis, (GINGER ROOT) 550 MG CAPS Take 1 capsule by mouth daily.   Yes Historical Provider, MD  insulin aspart (NOVOLOG) 100 UNIT/ML injection Inject 40-50 Units into the skin 3 (three) times daily before meals. Inject 50 units at breakfast and 40 units at  lunch and 40 units at dinner. Yesenia Lyons on sliding scale   Yes Historical Provider, MD  spironolactone (ALDACTONE) 25 MG tablet Take 25 mg by mouth daily.   Yes Historical Provider, MD  albuterol (PROVENTIL HFA;VENTOLIN HFA) 108 (90 BASE) MCG/ACT inhaler  Inhale 2 puffs into the lungs every 6 (six) hours as needed for wheezing.     Historical Provider, MD   Physical Exam: Filed Vitals:   03/29/13 1200 03/29/13 1300 03/29/13 1420 03/29/13 1430  BP: 146/84 131/106 151/70 151/69  Pulse: 84 95 82 80  Temp:      TempSrc:      Resp:      SpO2: 100% 98% 100% 100%     General:  Awake, in nad  Eyes:  PERRL B   ENT: membranes moist, dentition fair  Neck: trachea midline, neck supple  Cardiovascular: regular, s1, s2  Respiratory: normal  resp effort, no wheezing  Abdomen: pos bs, diffusely tender, palpable stool  Skin: no abnormal skin lesions seen  Musculoskeletal: perfused, no clubbing  Psychiatric: mood/affect normal//appears slightly confused  Neurologic: cn2-12 grossly intact, strength/sensation intact  Labs on Admission:  Basic Metabolic Panel:  Recent Labs Lab 03/29/13 1340  NA 138  K 4.4  CL 96  CO2 29  GLUCOSE 272*  BUN 19  CREATININE 0.99  CALCIUM 9.3   Liver Function Tests: No results found for this basename: AST, ALT, ALKPHOS, BILITOT, PROT, ALBUMIN,  in the last 168 hours No results found for this basename: LIPASE, AMYLASE,  in the last 168 hours No results found for this basename: AMMONIA,  in the last 168 hours CBC:  Recent Labs Lab 03/29/13 1340  WBC 18.4*  NEUTROABS 14.4*  HGB 8.5*  HCT 28.7*  MCV 82.9  PLT 463*   Cardiac Enzymes: No results found for this basename: CKTOTAL, CKMB, CKMBINDEX, TROPONINI,  in the last 168 hours  BNP (last 3 results) No results found for this basename: PROBNP,  in the last 8760 hours CBG: No results found for this basename: GLUCAP,  in the last 168 hours  Radiological Exams on Admission: Dg Chest Port 1 View  03/29/2013   CLINICAL DATA:  History CHF, diabetes and high blood pressure. Shortness breath and chest tightness.  EXAM: PORTABLE CHEST - 1 VIEW  COMPARISON:  10/29/2011  FINDINGS: Poor inspiration.  Elevated right hemidiaphragm.  Cardiomegaly.  Pulmonary vascular congestion/ mild congestive heart failure.  No segmental consolidation or mass identified. Evaluation of the right lung base limited by the elevated right hemidiaphragm.  The patient would eventually benefit from two view chest.  IMPRESSION: Cardiomegaly.  Pulmonary vascular congestion/mild congestive heart failure.  Please see above.   Electronically Signed   By: Chauncey Cruel M.D.   On: 03/29/2013 13:02    Assessment/Plan Active Problems:   Abdominal pain   CHF (congestive heart  failure)   Diabetes mellitus   Weight loss, non-intentional   HTN (hypertension)   Obesity   1. SIRS 1. Fever with leukocytosis 2. Unclear source 3. Urinalysis unremarkable 4. CXR with findings suggestive of vascular congestion (see below) 5. CT abd pending 6. Consider empiric broad spec abx 7. Admit to med-surg 2. CHF 1. Pulm vascular congestion noted on cxr 2. Cont with IV lasix for now 3. Follow i/o and daily wts closely 3. Abdominal pain/unintentional weight loss 1. Unclear etiology 2. CT abd pending 4. Weakness 1. Will consult Yesenia Lyons/OT 5. DVT prophylaxis 1. Heparin subQ   Code Status: Full (must indicate code status--if unknown or must be presumed, indicate so) Family Communication: Yesenia Lyons in room (indicate person spoken with, if applicable, with phone number if by telephone) Disposition Plan: Pending (indicate anticipated LOS)  Time spent: 18min  Lamichael Youkhana, [facility] Pager [phone]  If 7PM-7AM,  please contact night-coverage [email or web address] Password [facility] 03/29/2013, 3:27 PM

## 2013-03-29 NOTE — ED Notes (Signed)
Report given to [facility]. Awaiting bariatric bed then will transport patient.

## 2013-03-29 NOTE — ED Notes (Signed)
PT from home. Reports 1 week history of generalized weakness, lethargy and lack of appetite. Pt states urine smells different. Denies cough/cold like symptoms. Pt states she threw up last night, but is able to keep down fluids.

## 2013-03-29 NOTE — Progress Notes (Signed)
Utilization Review completed.  Rolen Conger RN CM  

## 2013-03-29 NOTE — ED Notes (Signed)
Unable to obtain IV after two attempts.  Will seek another RN for IV start.

## 2013-03-29 NOTE — ED Provider Notes (Addendum)
CSN: [id number]     Arrival date & time 03/29/13  1104 History   First MD Initiated Contact with Patient 03/29/13 1144     Chief Complaint  Patient presents with  . Fatigue      HPI  Patient presents from home via EMS. Accompanied by her daughters. She is a family. She normally is able to be up and around. Although, this does take her some effort. She weighs 450 pounds. She's not been able to eat for the last several days. Family states she has lost 40 pounds over the last month despite not attempting to. She gets abdominal discomfort when she attempts to eat. For the last week she's had progressive weakness. She was unable to even sit on the edge of the bed today. Temperature 102 yesterday per family.  No specific abdominal pain. Although she states she gets "knots" in her abdomen when she eats . Normal urine output. She has chronic swelling of extremities, this is unchanged. She does not feel any more short of breath and she does on a normal day. Continues to take Lasix, with normal urine output. No falls. No injuries. No trauma. Denies dyspnea. Denies dysuria.  Past Medical History  Diagnosis Date  . Pneumonia   . Renal disorder     renal failure  . Obesity   . Hypertension   . Diabetes mellitus   . High cholesterol   . CHF (congestive heart failure)    Past Surgical History  Procedure Laterality Date  . Abdominal hysterectomy    . Knee reconstruction, medial patellar femoral ligament     History reviewed. No pertinent family history. History  Substance Use Topics  . Smoking status: Never Smoker   . Smokeless tobacco: Never Used  . Alcohol Use: No   OB History   Grav Para Term Preterm Abortions TAB SAB Ect Mult Living                 Review of Systems  Constitutional: Positive for fever, activity change, fatigue and unexpected weight change. Negative for chills, diaphoresis and appetite change.  HENT: Negative for mouth sores, sore throat and trouble swallowing.   Eyes:  Negative for visual disturbance.  Respiratory: Negative for cough, chest tightness, shortness of breath and wheezing.   Cardiovascular: Negative for chest pain.  Gastrointestinal: Negative for nausea, vomiting, abdominal pain, diarrhea and abdominal distention.  Endocrine: Negative for polydipsia, polyphagia and polyuria.  Genitourinary: Negative for dysuria, frequency and hematuria.  Musculoskeletal: Negative for gait problem.  Skin: Negative for color change, pallor and rash.  Neurological: Negative for dizziness, syncope, light-headedness and headaches.  Hematological: Does not bruise/bleed easily.  Psychiatric/Behavioral: Negative for behavioral problems and confusion.      Allergies  Penicillins  Home Medications   Current Outpatient Rx  Name  Route  Sig  Dispense  Refill  . acetaminophen (TYLENOL) 650 MG CR tablet   Oral   Take 650 mg by mouth every 8 (eight) hours as needed for pain.         Marland Kitchen amLODipine-benazepril (LOTREL) 10-20 MG per capsule   Oral   Take 1 capsule by mouth daily.         Marland Kitchen atenolol (TENORMIN) 50 MG tablet   Oral   Take 50 mg by mouth daily.         . beclomethasone (QVAR) 80 MCG/ACT inhaler   Inhalation   Inhale 1 puff into the lungs as needed. breathing         .  Chromium-Cinnamon (CINNAMON PLUS CHROMIUM) [phone] MCG-MG CAPS   Oral   Take 1 capsule by mouth daily.         . Cranberry (HM CRANBERRY SUPER STRENGTH) 300 MG tablet   Oral   Take 300 mg by mouth 2 (two) times daily.         . furosemide (LASIX) 40 MG tablet   Oral   Take 1 tablet (40 mg total) by mouth daily.   10 tablet   0   . gabapentin (NEURONTIN) 400 MG capsule   Oral   Take 400 mg by mouth 3 (three) times daily.         . Ginger, Zingiber officinalis, (GINGER ROOT) 550 MG CAPS   Oral   Take 1 capsule by mouth daily.         . insulin aspart (NOVOLOG) 100 UNIT/ML injection   Subcutaneous   Inject 40-50 Units into the skin 3 (three) times daily  before meals. Inject 50 units at breakfast and 40 units at  lunch and 40 units at dinner. Pt on sliding scale         . spironolactone (ALDACTONE) 25 MG tablet   Oral   Take 25 mg by mouth daily.         Marland Kitchen albuterol (PROVENTIL HFA;VENTOLIN HFA) 108 (90 BASE) MCG/ACT inhaler   Inhalation   Inhale 2 puffs into the lungs every 6 (six) hours as needed for wheezing.           BP 151/69  Pulse 80  Temp(Src) 100.7 F (38.2 C) (Oral)  Resp 25  SpO2 100% Physical Exam  Constitutional: She is oriented to person, place, and time.  Morbidly obese female supine on a hospital bed. She is awake alert.  HENT:  Head: Normocephalic.  Her conjunctiva are not pale  Eyes: Conjunctivae are normal. Pupils are equal, round, and reactive to light. No scleral icterus.  Neck: Normal range of motion. Neck supple. No thyromegaly present.  Cardiovascular: Normal rate and regular rhythm.  Exam reveals no gallop and no friction rub.   No murmur heard. Pulmonary/Chest: Effort normal and breath sounds normal. No respiratory distress. She has no wheezes. She has no rales.  No abnormal breath sounds. No wheezing rales rhonchi or prolongation.  Abdominal: Soft. Bowel sounds are normal. She exhibits no distension. There is no tenderness. There is no rebound.  Obese. Protuberant. Complains of a feeling of "knot" in her upper abdomen is palpated. No peritoneal irritation or localizing tenderness  Musculoskeletal: Normal range of motion.  Neurological: She is alert and oriented to person, place, and time.  Nonfocal weakness. Is not lateralized. No hemiparesis.   Skin: Skin is warm and dry. No rash noted.  No Rash. No erythema.  Psychiatric: She has a normal mood and affect. Her behavior is normal.    ED Course  Procedures (including critical care time) Labs Review Labs Reviewed  CBC WITH DIFFERENTIAL - Abnormal; Notable for the following:    WBC 18.4 (*)    RBC 3.46 (*)    Hemoglobin 8.5 (*)    HCT 28.7  (*)    MCH 24.6 (*)    MCHC 29.6 (*)    Platelets 463 (*)    Neutrophils Relative % 79 (*)    Neutro Abs 14.4 (*)    Lymphocytes Relative 7 (*)    Monocytes Relative 14 (*)    Monocytes Absolute 2.6 (*)    All other components within normal limits  BASIC  METABOLIC PANEL - Abnormal; Notable for the following:    Glucose, Bld 272 (*)    GFR calc non Af Amer 59 (*)    GFR calc Af Amer 69 (*)    All other components within normal limits  URINALYSIS, ROUTINE W REFLEX MICROSCOPIC - Abnormal; Notable for the following:    Bilirubin Urine SMALL (*)    All other components within normal limits  CULTURE, BLOOD (ROUTINE X 2)  CULTURE, BLOOD (ROUTINE X 2)  URINE CULTURE  PRO B NATRIURETIC PEPTIDE  I-STAT CG4 LACTIC ACID, ED   Imaging Review Dg Chest Port 1 View  03/29/2013   CLINICAL DATA:  History CHF, diabetes and high blood pressure. Shortness breath and chest tightness.  EXAM: PORTABLE CHEST - 1 VIEW  COMPARISON:  10/29/2011  FINDINGS: Poor inspiration.  Elevated right hemidiaphragm.  Cardiomegaly.  Pulmonary vascular congestion/ mild congestive heart failure.  No segmental consolidation or mass identified. Evaluation of the right lung base limited by the elevated right hemidiaphragm.  The patient would eventually benefit from two view chest.  IMPRESSION: Cardiomegaly.  Pulmonary vascular congestion/mild congestive heart failure.  Please see above.   Electronically Signed   By: Chauncey Cruel M.D.   On: 03/29/2013 13:02     EKG Interpretation None      MDM   Final diagnoses:  None   Low-grade temp 100.7 on arrival. She is on her typical 3 L of nasal cannula O2 and saturating her baseline. Body habitus changes versus mild CHF on chest x-ray. Urine does not appear infected. No obvious pneumonia. Has a leukocytosis of 18,000, no lactic acidosis.   Abdominal exam remains benign. However, must admit this is a limited exam based on the size of her abdomen. She has abdominal complaints  although her abdomen is palpate with specific tenderness. Orders CT scan of her abdomen considering her symptoms or weight loss and her fever leukocytosis without obvious cause.    Tanna Furry, MD 03/29/13 1500  Tanna Furry, MD 03/29/13 [phone]

## 2013-03-29 NOTE — ED Notes (Signed)
Bed: EK80 Expected date:  Expected time:  Means of arrival:  Comments: ems- 63 yo F, UTI, bariatric

## 2013-03-30 ENCOUNTER — Inpatient Hospital Stay (HOSPITAL_COMMUNITY): Payer: Medicare (Managed Care)

## 2013-03-30 ENCOUNTER — Encounter (HOSPITAL_COMMUNITY): Payer: Self-pay

## 2013-03-30 DIAGNOSIS — E43 Unspecified severe protein-calorie malnutrition: Secondary | ICD-10-CM | POA: Diagnosis present

## 2013-03-30 LAB — CBC
HCT: 27.6 % — ABNORMAL LOW (ref 36.0–46.0)
Hemoglobin: 8.2 g/dL — ABNORMAL LOW (ref 12.0–15.0)
MCH: 24.8 pg — ABNORMAL LOW (ref 26.0–34.0)
MCHC: 29.7 g/dL — ABNORMAL LOW (ref 30.0–36.0)
MCV: 83.6 fL (ref 78.0–100.0)
Platelets: 449 10*3/uL — ABNORMAL HIGH (ref 150–400)
RBC: 3.3 MIL/uL — ABNORMAL LOW (ref 3.87–5.11)
RDW: 15.3 % (ref 11.5–15.5)
WBC: 19.8 10*3/uL — ABNORMAL HIGH (ref 4.0–10.5)

## 2013-03-30 LAB — COMPREHENSIVE METABOLIC PANEL
ALT: 16 U/L (ref 0–35)
AST: 23 U/L (ref 0–37)
Albumin: 2.4 g/dL — ABNORMAL LOW (ref 3.5–5.2)
Alkaline Phosphatase: 232 U/L — ABNORMAL HIGH (ref 39–117)
BUN: 16 mg/dL (ref 6–23)
CALCIUM: 9.3 mg/dL (ref 8.4–10.5)
CO2: 33 meq/L — AB (ref 19–32)
CREATININE: 0.97 mg/dL (ref 0.50–1.10)
Chloride: 100 mEq/L (ref 96–112)
GFR calc Af Amer: 71 mL/min — ABNORMAL LOW (ref >90)
GFR, EST NON AFRICAN AMERICAN: 61 mL/min — AB (ref >90)
Glucose, Bld: 182 mg/dL — ABNORMAL HIGH (ref 70–99)
Potassium: 4.2 mEq/L (ref 3.7–5.3)
SODIUM: 142 meq/L (ref 137–147)
TOTAL PROTEIN: 7.4 g/dL (ref 6.0–8.3)
Total Bilirubin: 0.8 mg/dL (ref 0.3–1.2)

## 2013-03-30 LAB — GLUCOSE, CAPILLARY
GLUCOSE-CAPILLARY: 138 mg/dL — AB (ref 70–99)
GLUCOSE-CAPILLARY: 267 mg/dL — AB (ref 70–99)
Glucose-Capillary: 143 mg/dL — ABNORMAL HIGH (ref 70–99)
Glucose-Capillary: 184 mg/dL — ABNORMAL HIGH (ref 70–99)

## 2013-03-30 LAB — URINE CULTURE
COLONY COUNT: NO GROWTH
CULTURE: NO GROWTH
SPECIAL REQUESTS: NORMAL

## 2013-03-30 MED ORDER — IOHEXOL 300 MG/ML  SOLN: 100.0000 mL | Freq: Once | INTRAMUSCULAR | Status: AC | PRN

## 2013-03-30 NOTE — Consult Note (Signed)
HPI: Yesenia Lyons is an 63 y.o. female with recent hx of fevers, weight loss, weakness, and progressive abdominal pain. She has been admitted and workup has found evidence of significant mesenteric and retroperitoneal adenopathy. She also has suspicious lesion in her liver. This is all concerning for malignant process. IR is asked to eval imaging for possible tissue sampling biopsy. Chart, PMHx, current meds reviewed. She is on chronic nasal canula O2 for 'pulmonary problems' per daughter. Sleeps with one pillow but with head of bed elevated.  Past Medical History:  Past Medical History  Diagnosis Date  . Pneumonia   . Renal disorder     renal failure  . Obesity   . Hypertension   . Diabetes mellitus   . High cholesterol   . CHF (congestive heart failure)     Past Surgical History:  Past Surgical History  Procedure Laterality Date  . Abdominal hysterectomy    . Knee reconstruction, medial patellar femoral ligament      Family History: History reviewed. No pertinent family history.  Social History:  reports that she has never smoked. She has never used smokeless tobacco. She reports that she does not drink alcohol or use illicit drugs.  Allergies:  Allergies  Allergen Reactions  . Penicillins Itching and Swelling    Medications:   Medication List    ASK your doctor about these medications       acetaminophen 650 MG CR tablet  Commonly known as:  TYLENOL  Take 650 mg by mouth every 8 (eight) hours as needed for pain.     albuterol 108 (90 BASE) MCG/ACT inhaler  Commonly known as:  PROVENTIL HFA;VENTOLIN HFA  Inhale 2 puffs into the lungs every 6 (six) hours as needed for wheezing.     amLODipine-benazepril 10-20 MG per capsule  Commonly known as:  LOTREL  Take 1 capsule by mouth daily.     atenolol 50 MG tablet  Commonly known as:  TENORMIN  Take 50 mg by mouth daily.     beclomethasone 80 MCG/ACT inhaler  Commonly known as:  QVAR  Inhale 1 puff into the  lungs as needed. breathing     CINNAMON PLUS CHROMIUM [phone] MCG-MG Caps  Generic drug:  Chromium-Cinnamon  Take 1 capsule by mouth daily.     furosemide 40 MG tablet  Commonly known as:  LASIX  Take 1 tablet (40 mg total) by mouth daily.     gabapentin 400 MG capsule  Commonly known as:  NEURONTIN  Take 400 mg by mouth 3 (three) times daily.     Ginger Root 550 MG Caps  Take 1 capsule by mouth daily.     HM CRANBERRY SUPER STRENGTH 300 MG tablet  Generic drug:  Cranberry  Take 300 mg by mouth 2 (two) times daily.     insulin aspart 100 UNIT/ML injection  Commonly known as:  novoLOG  Inject 40-50 Units into the skin 3 (three) times daily before meals. Inject 50 units at breakfast and 40 units at  lunch and 40 units at dinner. Pt on sliding scale     spironolactone 25 MG tablet  Commonly known as:  ALDACTONE  Take 25 mg by mouth daily.        Please HPI for pertinent positives, otherwise complete 10 system ROS negative.  Physical Exam: BP 138/80  Pulse 69  Temp(Src) 97.9 F (36.6 C) (Oral)  Resp 18  Ht 5' 11" (1.803 m)  Wt 369 lb (167.377 kg)  BMI  51.49 kg/m2  SpO2 96% Body mass index is 51.49 kg/(m^2).   General Appearance:  Morbidly obese AA female, no acute distress. Cooperative.  Head:  Normocephalic, without obvious abnormality, atraumatic  ENT: Unremarkable. [place] O2  Neck: Supple, symmetrical, trachea midline  Lungs:   Clear to auscultation bilaterally, no w/r/r.  Chest Wall:  No tenderness or deformity  Heart:  Regular rate and rhythm, S1, S2 normal, no murmur, rub or gallop.  Abdomen:   Soft, non-tender, non distended.  Extremities: Extremities normal, atraumatic, no cyanosis or edema  Neurologic: Normal affect, no gross deficits.   Results for orders placed during the hospital encounter of 03/29/13 (from the past 48 hour(s))  URINALYSIS, ROUTINE W REFLEX MICROSCOPIC     Status: Abnormal   Collection Time    03/29/13  1:05 PM      Result Value Ref  Range   Color, Urine YELLOW  YELLOW   APPearance CLEAR  CLEAR   Specific Gravity, Urine 1.015  1.005 - 1.030   pH 6.0  5.0 - 8.0   Glucose, UA NEGATIVE  NEGATIVE mg/dL   Hgb urine dipstick NEGATIVE  NEGATIVE   Bilirubin Urine SMALL (*) NEGATIVE   Ketones, ur NEGATIVE  NEGATIVE mg/dL   Protein, ur NEGATIVE  NEGATIVE mg/dL   Urobilinogen, UA 0.2  0.0 - 1.0 mg/dL   Nitrite NEGATIVE  NEGATIVE   Leukocytes, UA NEGATIVE  NEGATIVE   Comment: MICROSCOPIC NOT DONE ON URINES WITH NEGATIVE PROTEIN, BLOOD, LEUKOCYTES, NITRITE, OR GLUCOSE <1000 mg/dL.  URINE CULTURE     Status: None   Collection Time    03/29/13  1:05 PM      Result Value Ref Range   Specimen Description URINE, CATHETERIZED     Special Requests Normal     Culture  Setup Time       Value: 03/29/2013 16:44     Performed at [facility]       Value: NO GROWTH     Performed at [facility]   Culture       Value: NO GROWTH     Performed at [facility]   Report Status 03/30/2013 FINAL    CBC WITH DIFFERENTIAL     Status: Abnormal   Collection Time    03/29/13  1:40 PM      Result Value Ref Range   WBC 18.4 (*) 4.0 - 10.5 K/uL   RBC 3.46 (*) 3.87 - 5.11 MIL/uL   Hemoglobin 8.5 (*) 12.0 - 15.0 g/dL   HCT 28.7 (*) 36.0 - 46.0 %   MCV 82.9  78.0 - 100.0 fL   MCH 24.6 (*) 26.0 - 34.0 pg   MCHC 29.6 (*) 30.0 - 36.0 g/dL   RDW 15.2  11.5 - 15.5 %   Platelets 463 (*) 150 - 400 K/uL   Neutrophils Relative % 79 (*) 43 - 77 %   Neutro Abs 14.4 (*) 1.7 - 7.7 K/uL   Lymphocytes Relative 7 (*) 12 - 46 %   Lymphs Abs 1.3  0.7 - 4.0 K/uL   Monocytes Relative 14 (*) 3 - 12 %   Monocytes Absolute 2.6 (*) 0.1 - 1.0 K/uL   Eosinophils Relative 0  0 - 5 %   Eosinophils Absolute 0.0  0.0 - 0.7 K/uL   Basophils Relative 0  0 - 1 %   Basophils Absolute 0.0  0.0 - 0.1 K/uL  CULTURE, BLOOD (ROUTINE X 2)  Status: None   Collection Time    03/29/13  1:40 PM      Result Value Ref Range   Specimen  Description BLOOD RIGHT ANTECUBITAL     Special Requests BOTTLES DRAWN AEROBIC AND ANAEROBIC 5 CC EA     Culture  Setup Time       Value: 03/29/2013 16:28     Performed at [facility]   Culture       Value:        BLOOD CULTURE RECEIVED NO GROWTH TO DATE CULTURE WILL BE HELD FOR 5 DAYS BEFORE ISSUING A FINAL NEGATIVE REPORT     Performed at [facility]   Report Status PENDING    BASIC METABOLIC PANEL     Status: Abnormal   Collection Time    03/29/13  1:40 PM      Result Value Ref Range   Sodium 138  137 - 147 mEq/L   Potassium 4.4  3.7 - 5.3 mEq/L   Chloride 96  96 - 112 mEq/L   CO2 29  19 - 32 mEq/L   Glucose, Bld 272 (*) 70 - 99 mg/dL   BUN 19  6 - 23 mg/dL   Creatinine, Ser 0.99  0.50 - 1.10 mg/dL   Calcium 9.3  8.4 - 10.5 mg/dL   GFR calc non Af Amer 59 (*) >90 mL/min   GFR calc Af Amer 69 (*) >90 mL/min   Comment: (NOTE)     The eGFR has been calculated using the CKD EPI equation.     This calculation has not been validated in all clinical situations.     eGFR's persistently <90 mL/min signify possible Chronic Kidney     Disease.  PRO B NATRIURETIC PEPTIDE     Status: Abnormal   Collection Time    03/29/13  1:40 PM      Result Value Ref Range   Pro B Natriuretic peptide (BNP) 399.1 (*) 0 - 125 pg/mL  I-STAT CG4 LACTIC ACID, ED     Status: None   Collection Time    03/29/13  1:49 PM      Result Value Ref Range   Lactic Acid, Venous 1.13  0.5 - 2.2 mmol/L  CULTURE, BLOOD (ROUTINE X 2)     Status: None   Collection Time    03/29/13  1:50 PM      Result Value Ref Range   Specimen Description BLOOD LEFT ANTECUBITAL     Special Requests BOTTLES DRAWN AEROBIC AND ANAEROBIC 5ML     Culture  Setup Time       Value: 03/29/2013 16:29     Performed at [facility]   Culture       Value:        BLOOD CULTURE RECEIVED NO GROWTH TO DATE CULTURE WILL BE HELD FOR 5 DAYS BEFORE ISSUING A FINAL NEGATIVE REPORT     Performed at [facility]    Report Status PENDING    GLUCOSE, CAPILLARY     Status: Abnormal   Collection Time    03/29/13  5:30 PM      Result Value Ref Range   Glucose-Capillary 197 (*) 70 - 99 mg/dL  GLUCOSE, CAPILLARY     Status: Abnormal   Collection Time    03/29/13 11:02 PM      Result Value Ref Range   Glucose-Capillary 202 (*) 70 - 99 mg/dL  COMPREHENSIVE METABOLIC PANEL     Status: Abnormal  Collection Time    03/30/13  4:35 AM      Result Value Ref Range   Sodium 142  137 - 147 mEq/L   Potassium 4.2  3.7 - 5.3 mEq/L   Chloride 100  96 - 112 mEq/L   CO2 33 (*) 19 - 32 mEq/L   Glucose, Bld 182 (*) 70 - 99 mg/dL   BUN 16  6 - 23 mg/dL   Creatinine, Ser 0.97  0.50 - 1.10 mg/dL   Calcium 9.3  8.4 - 10.5 mg/dL   Total Protein 7.4  6.0 - 8.3 g/dL   Albumin 2.4 (*) 3.5 - 5.2 g/dL   AST 23  0 - 37 U/L   ALT 16  0 - 35 U/L   Alkaline Phosphatase 232 (*) 39 - 117 U/L   Total Bilirubin 0.8  0.3 - 1.2 mg/dL   GFR calc non Af Amer 61 (*) >90 mL/min   GFR calc Af Amer 71 (*) >90 mL/min   Comment: (NOTE)     The eGFR has been calculated using the CKD EPI equation.     This calculation has not been validated in all clinical situations.     eGFR's persistently <90 mL/min signify possible Chronic Kidney     Disease.  CBC     Status: Abnormal   Collection Time    03/30/13  4:35 AM      Result Value Ref Range   WBC 19.8 (*) 4.0 - 10.5 K/uL   Comment: WHITE COUNT CONFIRMED ON SMEAR   RBC 3.30 (*) 3.87 - 5.11 MIL/uL   Hemoglobin 8.2 (*) 12.0 - 15.0 g/dL   HCT 27.6 (*) 36.0 - 46.0 %   MCV 83.6  78.0 - 100.0 fL   MCH 24.8 (*) 26.0 - 34.0 pg   MCHC 29.7 (*) 30.0 - 36.0 g/dL   RDW 15.3  11.5 - 15.5 %   Platelets 449 (*) 150 - 400 K/uL  GLUCOSE, CAPILLARY     Status: Abnormal   Collection Time    03/30/13  7:53 AM      Result Value Ref Range   Glucose-Capillary 143 (*) 70 - 99 mg/dL  GLUCOSE, CAPILLARY     Status: Abnormal   Collection Time    03/30/13 11:51 AM      Result Value Ref Range    Glucose-Capillary 138 (*) 70 - 99 mg/dL   Ct Abdomen Pelvis Wo Contrast  03/29/2013   CLINICAL DATA:  Shortness of breath. Weight loss. Abdominal pain. Fever. Leukocytosis.  EXAM: CT ABDOMEN AND PELVIS WITHOUT CONTRAST  TECHNIQUE: Multidetector CT imaging of the abdomen and pelvis was performed following the standard protocol without intravenous contrast.  COMPARISON:  None.  FINDINGS: Body habitus reduces diagnostic sensitivity and specificity.  Subsegmental atelectasis and indistinct ground-glass opacities in both lower lobes. Mild cardiomegaly. Indistinct abnormal areas of hypodensity are present in the liver, including a potential 5.1 cm region of hypodensity in the right hepatic lobe on image 22 of series 2.  The spleen, pancreas, and right adrenal gland appear normal. In the expected vicinity of the left adrenal gland, a 4.7 x 4.9 by 4.2 cm mildly hyperdense mass is present. Internal density 38 Hounsfield units. This is tangential to the upper pole of the left kidney.  The kidneys appear otherwise unremarkable. Gastrohepatic ligament lymph node short axis diameter 1.4 cm. Suspected porta hepatis adenopathy. Celiac trunk node 1.1 cm, image 32 of series 2.  Left perirenal lymph node along the  retroperitoneal, 1.4 cm in short axis, image 41 of series 2. Aortic caval lymph node 1.4 cm in short axis, image 42 of series 2.  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 Multiple loops of adjacent small bowel are present along the margin of this lesion.  Trace free pelvic fluid eccentric to the right. Mild pelvic floor laxity with the anorectal junction well below the pubococcygeal line. Lower lumbar facet arthropathy.  No renal calculi or hydronephrosis.  No ureteral calculus observed.  IMPRESSION: 1. Right lower quadrant mass with  internal calcification and probable cicatricial response, confluent with central mesenteric adenopathy. Scattered retroperitoneal, mesenteric, porta hepatis, and gastrohepatic ligament adenopathy. Faint hypodensities in the liver suspicious for liver masses. There is also a mass in the vicinity of the left adrenal gland measuring up to 4.9 cm. Top differential diagnostic considerations include the carcinoid tumor with adenopathy and hepatic metastatic disease; lymphoma; colon cancer ; or gastrointestinal stromal tumor. Consider correlation with markers such is 5-HIAA and chromogranin A. Tissue diagnosis suggested. 2. The left adrenal / suprarenal lesion is nonspecific and could represent confluent adenopathy, adrenal mass, or less likely exophytic renal cell carcinoma from the upper pole of the left kidney. 3. Ground-glass opacities in both lower lobes suggest edema. Mild cardiomegaly noted. There is also mild bibasilar subsegmental atelectasis. 4. Trace free pelvic fluid.  Mild pelvic floor laxity.   Electronically Signed   By: Sherryl Barters M.D.   On: 03/29/2013 17:24   Dg Chest Port 1 View  03/29/2013   CLINICAL DATA:  History CHF, diabetes and high blood pressure. Shortness breath and chest tightness.  EXAM: PORTABLE CHEST - 1 VIEW  COMPARISON:  10/29/2011  FINDINGS: Poor inspiration.  Elevated right hemidiaphragm.  Cardiomegaly.  Pulmonary vascular congestion/ mild congestive heart failure.  No segmental consolidation or mass identified. Evaluation of the right lung base limited by the elevated right hemidiaphragm.  The patient would eventually benefit from two view chest.  IMPRESSION: Cardiomegaly.  Pulmonary vascular congestion/mild congestive heart failure.  Please see above.   Electronically Signed   By: Chauncey Cruel M.D.   On: 03/29/2013 13:02    Assessment/Plan Mesenteric adenopathy. Retroperitoneal adenopathy and left peri-adrenal mass of uncertain etiology. Liver lesions noted on CT. Dr.  Anselm Pancoast has reviewed imaging, can try for US guided liver lesion biopsy. But given body habitus, there is possibility this might be visualized on [place]. May need to convert to CT guided biopsy of liver vs retroperitoneal mass. May also need to proceed with CT chest first to see if there is axillary or neck adenopathy amenable to biopsy. Discussed the above, including procedure details, risks, complications, use of sedation with pt and daughter. She hasn't eaten today and is very hungry, She elects to defer procedure until tomorrow. Will make her NPO p MN. Hold am Heparin SQ Checking coags. Consent signed in front of chart   Ascencion Dike PA-C 03/30/2013, 2:16 PM

## 2013-03-30 NOTE — Clinical Documentation Improvement (Signed)
PLEASE SPECIFY TYPE AND ACUITY OF CHF: Possible Clinical Conditions?  Acute Systolic Congestive Heart Failure Acute Diastolic Congestive Heart Failure Acute Systolic & Diastolic Congestive Heart Failure Acute on Chronic Systolic Congestive Heart Failure Acute on Chronic Diastolic Congestive Heart Failure Acute on Chronic Systolic & Diastolic Congestive Heart Failure Chronic Systolic Congestive Heart Failure Chronic Diastolic Congestive Heart Failure Chronic Systolic & Diastolic Congestive Heart Failure Other Condition Cannot Clinically Determine  Supporting Information:(As per notes) "CHF Pulm vascular congestion noted on cxr " 1. Cont with IV lasix for now  2. Follow i/o and daily wts closely   Thank You, Alessandra Grout, RN, BSN, CCDS, Clinical Documentation Specialist:  [phone]   Cell= [phone] [facility]

## 2013-03-30 NOTE — Progress Notes (Signed)
Clinical Social Work Department BRIEF PSYCHOSOCIAL ASSESSMENT 03/30/2013  Patient:  Yesenia Lyons, Yesenia Lyons     Account Number:  [id number]     Admit date:  03/29/2013  Clinical Social Worker:  Earlie Server  Date/Time:  03/30/2013 02:00 PM  Referred by:  Physician  Date Referred:  03/30/2013 Referred for  SNF Placement   Other Referral:   Interview type:  Family Other interview type:    PSYCHOSOCIAL DATA Living Status:  FAMILY Admitted from facility:   Level of care:   Primary support name:  [name] Primary support relationship to patient:  CHILD, ADULT Degree of support available:   Strong    CURRENT CONCERNS Current Concerns  Post-Acute Placement   Other Concerns:    SOCIAL WORK ASSESSMENT / PLAN CSW received referral in order to complete psychosocial assessment. CSW reviewed chart which stated that PT/OT completed evaluations and recommend HH vs SNF depending on patient's progress. CSW went to room in order to meet with patient. When CSW arrived patient was out of room for procedure but dtr present. CSW introduced myself and explained role.    Patient was living in [place] but moved to [place] in order to live with dtr. Dtr reports move went well and she assists patient as needed. Dtr reports that she works during the day so patient is home alone. Dtr is aware of PT/OT recommendations and reports that patient has used [place] in the past and has been to SNF in [facility]. Dtr aware and agreeable for CSW to continue to follow in order to assist with DC planning.   Assessment/plan status:  Psychosocial Support/Ongoing Assessment of Needs Other assessment/ plan:   Information/referral to community resources:   SNF vs [facility] information    PATIENT'S/FAMILY'S RESPONSE TO PLAN OF CARE: Patient not in room for assessment. Dtr present and engaged. Dtr reports she is trying to care for patient but has to continue to work. Dtr reports she is juggling work and family life but  trying to remain positive. Dtr thanked CSW for visit and agreeable for CSW to follow up at later time in order to talk with patient re: her feelings for care at DC.       [place], [place] [phone]

## 2013-03-30 NOTE — Progress Notes (Signed)
INITIAL NUTRITION ASSESSMENT  Pt meets criteria for severe MALNUTRITION in the context of chronic illness as evidenced by <75% estimated energy intake with 9.8% weight loss in the past month per pt report.  DOCUMENTATION CODES Per approved criteria  -Severe malnutrition in the context of chronic illness -Morbid Obesity   INTERVENTION: - Resource Breeze TID - Diet advancement per MD - Spiritual care consult  - Will continue to monitor   NUTRITION DIAGNOSIS: Inadequate oral intake related to clear liquid diet as evidenced by diet order.    Goal: Advance diet as tolerated to diabetic diet  Monitor:  Weights, labs, diet advancement  Reason for Assessment: Malnutrition screening tool   63 y.o. female  Admitting Dx: Abdominal pain, fever, and weight loss  ASSESSMENT: Pt discussed during multidisciplinary rounds. Pt with hx of chf, HTN, DM, and obesity who presents to the ED with a hx of progressive wt loss, decreased appetite, and generalized abdominal pain. CT of abdomen/pelvis yesterday suspicious of metastatic CA per RN report that MD discussed with pt yesterday.   Met with pt who reports poor appetite for the past week with pt consuming only 2 Ensure Plus/day. Reports 40 pound unintended weight loss in the past month. C/o abdominal pain and bloating after eating/drinking that has been going on for the past week.   Alk phos elevated  Height: Ht Readings from Last 1 Encounters:  03/29/13 _0  (1.803 m)    Weight: Wt Readings from Last 1 Encounters:  03/30/13 369 lb (167.377 kg)    Ideal Body Weight: 155 lb   % Ideal Body Weight: 238%  Wt Readings from Last 10 Encounters:  03/30/13 369 lb (167.377 kg)  10/29/11 453 lb 4.3 oz (205.6 kg)    Usual Body Weight: 409 lb 1 month ago per pt  % Usual Body Weight: 90%  BMI:  Body mass index is 51.49 kg/(m^2). Class III extreme obesity   Estimated Nutritional Needs: Kcal: 1750-1950 Protein: 85-100g Fluid:  1.7-1.9L/day  Skin: Non-pitting RLE, LLE edema  Diet Order: Clear Liquid  EDUCATION NEEDS: -No education needs identified at this time   Intake/Output Summary (Last 24 hours) at 03/30/13 1112 Last data filed at 03/29/13 1900  Gross per 24 hour  Intake     10 ml  Output    400 ml  Net   -390 ml    Last BM: 3/23  Labs:   Recent Labs Lab 03/29/13 1340 03/30/13 0435  NA 138 142  K 4.4 4.2  CL 96 100  CO2 29 33*  BUN 19 16  CREATININE 0.99 0.97  CALCIUM 9.3 9.3  GLUCOSE 272* 182*    CBG (last 3)   Recent Labs  03/29/13 1730 03/29/13 2302 03/30/13 0753  GLUCAP 197* 202* 143*    Scheduled Meds: . amLODipine  10 mg Oral Daily  . atenolol  50 mg Oral Daily  . benazepril  20 mg Oral Daily  . fluticasone  1 puff Inhalation BID  . furosemide  40 mg Intravenous Daily  . gabapentin  400 mg Oral TID  . heparin  5,000 Units Subcutaneous 3 times per day  . insulin aspart  0-15 Units Subcutaneous TID WC    Continuous Infusions:   Past Medical History  Diagnosis Date  . Pneumonia   . Renal disorder     renal failure  . Obesity   . Hypertension   . Diabetes mellitus   . High cholesterol   . CHF (congestive heart failure)  Past Surgical History  Procedure Laterality Date  . Abdominal hysterectomy    . Knee reconstruction, medial patellar femoral ligament      Mikey College MS, RD, LDN [phone] Pager [phone] After Hours Pager

## 2013-03-30 NOTE — Progress Notes (Addendum)
PATIENT DETAILS Name: Yesenia Lyons Age: 63 y.o. Sex: female Date of Birth: [birth year] Admit Date: 03/29/2013 Admitting Physician Donne Hazel, MD [id number], Mallie Mussel, MD  Subjective: Admitted with several months hx of weakness, weight loss, subjective fever with night sweats. Started having worsening abdominal pain a few weeks prior to admission.  Assessment/Plan: Active Problems:   Abdominal pain  -likely secondary to Right lower quadrant mass that was seen on the CT Abdomen done on admission -Supportive care for now-will consult IR to see if we can biopsy liver lesion-hold further CT's work up till seen by radiology  Fever -given overall clinical picture- with weight loss, Abd mass and lymphadenopathy-highly suspicion for Lymphoma with B symptoms -no foci of infection apparent-however leukocytosis increasing-but looks non toxic-continue to monitor off antibiotics-blood and urine culture on 3/23 negative. If any worsening, will start antibiotics.  Abd Mass, Lymphadenopathy, fever with weight loss -malignancy unless proven otherwise-await IR eval-for liver mass bx. -suspect Lymphoma at this time-check LDH  SIR's -as above. -?B symptoms-all cultures neg so far  ?Pul Edema on CXR -not sure if this CHF at this time-lying flat-hold Lasix-clinically euvolemic-check Echo -if needed will pursue CT Chest to delineate if any chest lesions-await IR eval first  Anemia -likely secondary to chronic illness-?malignancy -check anemia panel  HTN -controlled with Atenolol, Benazepril and Amlodipine  DM -CBG's controlled with SSI  Morbid Obesity -counseled regarding weight loss-apparently has lost 40 lbs since Aug 2014!!  Severe malnutrition - in the context of chronic illness -c/w supplements  Disposition: Remain inpatient  DVT Prophylaxis: Prophylactic Heparin  Code Status: Full code  Family Communication Daughter at bedside  Procedures:  None  CONSULTS:   IR  Time spent 40 minutes-which includes 50% of the time with face-to-face with patient/ family and coordinating care related to the above assessment and plan.    MEDICATIONS: Scheduled Meds: . amLODipine  10 mg Oral Daily  . atenolol  50 mg Oral Daily  . benazepril  20 mg Oral Daily  . fluticasone  1 puff Inhalation BID  . gabapentin  400 mg Oral TID  . heparin  5,000 Units Subcutaneous 3 times per day  . insulin aspart  0-15 Units Subcutaneous TID WC   Continuous Infusions:  PRN Meds:.acetaminophen, acetaminophen, albuterol, iohexol, morphine injection, ondansetron (ZOFRAN) IV, ondansetron  Antibiotics: Anti-infectives   None       PHYSICAL EXAM: Vital signs in last 24 hours: Filed Vitals:   03/30/13 0301 03/30/13 0647 03/30/13 0746 03/30/13 1100  BP:  146/82  138/80  Pulse:  72  69  Temp: 98 F (36.7 C) 98.7 F (37.1 C)  97.9 F (36.6 C)  TempSrc: Oral Oral  Oral  Resp:  20  18  Height:      Weight:  167.377 kg (369 lb)    SpO2:  99% 93% 96%    Weight change:  Filed Weights   03/29/13 1712 03/30/13 0647  Weight: 167.831 kg (370 lb) 167.377 kg (369 lb)   Body mass index is 51.49 kg/(m^2).   Gen Exam: Awake and alert with clear speech.   Neck: Supple, No JVD.   Chest: B/L Clear.   CVS: S1 S2 Regular, no murmurs.  Abdomen: soft, BS +, non tender, non distended.  Extremities: no edema, lower extremities warm to touch. Neurologic: Non Focal.  Skin: No Rash.   Wounds: N/A.    Intake/Output from previous day:  Intake/Output Summary (Last 24 hours) at  03/30/13 1339 Last data filed at 03/29/13 1900  Gross per 24 hour  Intake     10 ml  Output      0 ml  Net     10 ml     LAB RESULTS: CBC  Recent Labs Lab 03/29/13 1340 03/30/13 0435  WBC 18.4* 19.8*  HGB 8.5* 8.2*  HCT 28.7* 27.6*  PLT 463* 449*  MCV 82.9 83.6  MCH 24.6* 24.8*  MCHC 29.6* 29.7*  RDW 15.2 15.3  LYMPHSABS 1.3  --   MONOABS 2.6*  --   EOSABS 0.0  --   BASOSABS 0.0  --      Chemistries   Recent Labs Lab 03/29/13 1340 03/30/13 0435  NA 138 142  K 4.4 4.2  CL 96 100  CO2 29 33*  GLUCOSE 272* 182*  BUN 19 16  CREATININE 0.99 0.97  CALCIUM 9.3 9.3    CBG:  Recent Labs Lab 03/29/13 1730 03/29/13 2302 03/30/13 0753 03/30/13 1151  GLUCAP 197* 202* 143* 138*    GFR Estimated Creatinine Clearance: 102.5 ml/min (by C-G formula based on Cr of 0.97).  Coagulation profile No results found for this basename: INR, PROTIME,  in the last 168 hours  Cardiac Enzymes No results found for this basename: CK, CKMB, TROPONINI, MYOGLOBIN,  in the last 168 hours  No components found with this basename: POCBNP,  No results found for this basename: DDIMER,  in the last 72 hours No results found for this basename: HGBA1C,  in the last 72 hours No results found for this basename: CHOL, HDL, LDLCALC, TRIG, CHOLHDL, LDLDIRECT,  in the last 72 hours No results found for this basename: TSH, T4TOTAL, FREET3, T3FREE, THYROIDAB,  in the last 72 hours No results found for this basename: VITAMINB12, FOLATE, FERRITIN, TIBC, IRON, RETICCTPCT,  in the last 72 hours No results found for this basename: LIPASE, AMYLASE,  in the last 72 hours  Urine Studies No results found for this basename: UACOL, UAPR, USPG, UPH, UTP, UGL, UKET, UBIL, UHGB, UNIT, UROB, ULEU, UEPI, UWBC, URBC, UBAC, CAST, CRYS, UCOM, BILUA,  in the last 72 hours  MICROBIOLOGY: Recent Results (from the past 240 hour(s))  URINE CULTURE     Status: None   Collection Time    03/29/13  1:05 PM      Result Value Ref Range Status   Specimen Description URINE, CATHETERIZED   Final   Special Requests Normal   Final   Culture  Setup Time     Final   Value: 03/29/2013 16:44     Performed at [place]     Final   Value: NO GROWTH     Performed at [facility]   Culture     Final   Value: NO GROWTH     Performed at [facility]   Report Status 03/30/2013 FINAL    Final  CULTURE, BLOOD (ROUTINE X 2)     Status: None   Collection Time    03/29/13  1:40 PM      Result Value Ref Range Status   Specimen Description BLOOD RIGHT ANTECUBITAL   Final   Special Requests BOTTLES DRAWN AEROBIC AND ANAEROBIC 5 CC EA   Final   Culture  Setup Time     Final   Value: 03/29/2013 16:28     Performed at [facility]   Culture     Final   Value:  BLOOD CULTURE RECEIVED NO GROWTH TO DATE CULTURE WILL BE HELD FOR 5 DAYS BEFORE ISSUING A FINAL NEGATIVE REPORT     Performed at [facility]   Report Status PENDING   Incomplete  CULTURE, BLOOD (ROUTINE X 2)     Status: None   Collection Time    03/29/13  1:50 PM      Result Value Ref Range Status   Specimen Description BLOOD LEFT ANTECUBITAL   Final   Special Requests BOTTLES DRAWN AEROBIC AND ANAEROBIC 5ML   Final   Culture  Setup Time     Final   Value: 03/29/2013 16:29     Performed at [facility]   Culture     Final   Value:        BLOOD CULTURE RECEIVED NO GROWTH TO DATE CULTURE WILL BE HELD FOR 5 DAYS BEFORE ISSUING A FINAL NEGATIVE REPORT     Performed at [facility]   Report Status PENDING   Incomplete    RADIOLOGY STUDIES/RESULTS: Ct Abdomen Pelvis Wo Contrast  03/29/2013   CLINICAL DATA:  Shortness of breath. Weight loss. Abdominal pain. Fever. Leukocytosis.  EXAM: CT ABDOMEN AND PELVIS WITHOUT CONTRAST  TECHNIQUE: Multidetector CT imaging of the abdomen and pelvis was performed following the standard protocol without intravenous contrast.  COMPARISON:  None.  FINDINGS: Body habitus reduces diagnostic sensitivity and specificity.  Subsegmental atelectasis and indistinct ground-glass opacities in both lower lobes. Mild cardiomegaly. Indistinct abnormal areas of hypodensity are present in the liver, including a potential 5.1 cm region of hypodensity in the right hepatic lobe on image 22 of series 2.  The spleen, pancreas, and right adrenal gland appear normal. In the  expected vicinity of the left adrenal gland, a 4.7 x 4.9 by 4.2 cm mildly hyperdense mass is present. Internal density 38 Hounsfield units. This is tangential to the upper pole of the left kidney.  The kidneys appear otherwise unremarkable. Gastrohepatic ligament lymph node short axis diameter 1.4 cm. Suspected porta hepatis adenopathy. Celiac trunk node 1.1 cm, image 32 of series 2.  Left perirenal lymph node along the retroperitoneal, 1.4 cm in short axis, image 41 of series 2. Aortic caval lymph node 1.4 cm in short axis, image 42 of series 2.  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 Multiple loops of adjacent small bowel are present along the margin of this lesion.  Trace free pelvic fluid eccentric to the right. Mild pelvic floor laxity with the anorectal junction well below the pubococcygeal line. Lower lumbar facet arthropathy.  No renal calculi or hydronephrosis.  No ureteral calculus observed.  IMPRESSION: 1. Right lower quadrant mass with internal calcification and probable cicatricial response, confluent with central mesenteric adenopathy. Scattered retroperitoneal, mesenteric, porta hepatis, and gastrohepatic ligament adenopathy. Faint hypodensities in the liver suspicious for liver masses. There is also a mass in the vicinity of the left adrenal gland measuring up to 4.9 cm. Top differential diagnostic considerations include the carcinoid tumor with adenopathy and hepatic metastatic disease; lymphoma; colon cancer ; or gastrointestinal stromal tumor. Consider correlation with markers such is 5-HIAA and chromogranin A. Tissue diagnosis suggested. 2. The left adrenal / suprarenal lesion is nonspecific and could represent confluent adenopathy, adrenal mass, or less likely exophytic renal cell carcinoma from the upper  pole of the left kidney. 3.  Ground-glass opacities in both lower lobes suggest edema. Mild cardiomegaly noted. There is also mild bibasilar subsegmental atelectasis. 4. Trace free pelvic fluid.  Mild pelvic floor laxity.   Electronically Signed   By: Sherryl Barters M.D.   On: 03/29/2013 17:24   Dg Chest Port 1 View  03/29/2013   CLINICAL DATA:  History CHF, diabetes and high blood pressure. Shortness breath and chest tightness.  EXAM: PORTABLE CHEST - 1 VIEW  COMPARISON:  10/29/2011  FINDINGS: Poor inspiration.  Elevated right hemidiaphragm.  Cardiomegaly.  Pulmonary vascular congestion/ mild congestive heart failure.  No segmental consolidation or mass identified. Evaluation of the right lung base limited by the elevated right hemidiaphragm.  The patient would eventually benefit from two view chest.  IMPRESSION: Cardiomegaly.  Pulmonary vascular congestion/mild congestive heart failure.  Please see above.   Electronically Signed   By: Chauncey Cruel M.D.   On: 03/29/2013 13:02    Oren Binet, MD  [facility] Pager:[phone]  If 7PM-7AM, please contact night-coverage [email or web address] Password [email or web address] 03/30/2013, 1:39 PM   LOS: 1 day

## 2013-03-30 NOTE — Evaluation (Signed)
Occupational Therapy Evaluation Patient Details Name: Yesenia Lyons MRN: [id number] DOB: January 19, 1950 Today's Date: 03/30/2013    History of Present Illness 63 yo female admitted with abdominal pain, UTI. hx of morbid obesity, HTN, DM, CHF.    Clinical Impression   Pt was admitted for the above.  She will benefit from skilled OT with goals focusing on toilet transfers/toileting. Pt does have RUE shoulder pain which has been going on for a few weeks.  She is right dominant.  Will introduce AE for toileting, if needed.    Follow Up Recommendations  Home health OT (pt does not have 24/7.  needs to be mod I with toileting) If pt does not reach this level, she may need STSNF   Equipment Recommendations    none recommended by OT   Recommendations for Other Services       Precautions / Restrictions Precautions Precautions: Fall Restrictions Weight Bearing Restrictions: No      Mobility Bed Mobility Overal bed mobility: Needs Assistance Bed Mobility: Supine to Sit     Supine to sit: Min assist;HOB elevated     General bed mobility comments: Increased time. Small amount of assist for trunk.   Transfers Overall transfer level: Needs assistance Equipment used: Rolling walker (2 wheeled) Transfers: Sit to/from [facility] Sit to Stand: Min assist;From elevated surface Stand pivot transfers: Min assist       General transfer comment: Assist to rise, stabilize, control descent. VCs safety, technique, hand placement    Balance                                    ADL           Lower Body Dressing: Maximal assistance;Sit to/from stand Toilet Transfer: +2 for physical assistance;Minimal assistance;Stand-pivot Toileting- Clothing Manipulation and Hygiene: Maximal assistance;Sit to/from stand     General ADL Comments: Had planned to stand at sink for teeth, but pt had urgency so performed while sitting on commode.  Pt had difficulty wiping but  states at home, she doesn't have any difficulty.  Pt needs to be able to get to bathroom and complete toileting at mod I level.  Pt can perform UB adls with set up      Vision                     Perception     Praxis      Pertinent Vitals/Pain R shoulder with movement; modified evaluation     Hand Dominance     Extremity/Trunk Assessment Upper Extremity Assessment Upper Extremity Assessment: RUE deficits/detail RUE Deficits / Details: painful R shoulder for several weeks.  AAROM to 90   Lower Extremity Assessment Lower Extremity Assessment: Generalized weakness   Cervical / Trunk Assessment Cervical / Trunk Assessment: Normal   Communication Communication Communication: No difficulties   Cognition Arousal/Alertness: Awake/alert Behavior During Therapy: WFL for tasks assessed/performed Overall Cognitive Status: Within Functional Limits for tasks assessed                     General Comments       Exercises      Home Living Family/patient expects to be discharged to:: Private residence Living Arrangements: Children Available Help at Discharge: Available PRN/intermittently (daughter works days) Type of Home: Apartment Home Access: Level entry     Home Layout: One level     Bathroom Shower/Tub:  Tub/shower unit Shower/tub characteristics: Architectural technologist: Standard     Home Equipment: Bedside commode;Walker - 4 wheels;Shower seat          Prior Functioning/Environment Level of Independence: Independent with assistive device(s)        Comments: doesn't wear socks    OT Diagnosis:     OT Problem List: Decreased strength;Pain;Obesity;Decreased knowledge of use of DME or AE   OT Treatment/Interventions: Self-care/ADL training;DME and/or AE instruction;Patient/family education    OT Goals(Current goals can be found in the care plan section) Acute Rehab OT Goals Patient Stated Goal: home OT Goal Formulation: With patient Time  For Goal Achievement: 04/13/13 Potential to Achieve Goals: Good ADL Goals Pt Will Transfer to Toilet: with modified independence;bedside commode;ambulating Pt Will Perform Toileting - Clothing Manipulation and hygiene: with modified independence;with adaptive equipment;sit to/from stand  OT Frequency: Min 2X/week   Barriers to D/C:            End of Session:    Activity Tolerance: Patient tolerated treatment well Patient left: in chair;with family/visitor present (maximove pad placed)   Time: 2836-6294 OT Time Calculation (min): 40 min Charges:  OT General Charges $OT Visit: 1 Procedure OT Evaluation $Initial OT Evaluation Tier I: 1 Procedure OT Treatments $Self Care/Home Management : 8-22 mins G-Codes:    Kayle Passarelli 04/14/13, 1:16 PM   Lesle Chris, OTR/L [phone] 04/14/13

## 2013-03-30 NOTE — Evaluation (Signed)
Physical Therapy Evaluation Patient Details Name: Yesenia Lyons MRN: [id number] DOB: November 12, 1950 Today's Date: 03/30/2013   History of Present Illness  63 yo female admitted with abdominal pain, UTI. hx of morbid obesity, HTN, DM, CHF.   Clinical Impression  On eval, pt required Min-Mod assist for mobility (+2 for safety due to size)-able to take a few steps in room and pivot to recliner with walker. Pt and daughter state plan is for home however daughter works during day.    Follow Up Recommendations Home health PT;SNF (depending on progress. HHPT, HHOT, home health aide if pt discharges home. )    Equipment Recommendations  None recommended by PT    Recommendations for Other Services OT consult     Precautions / Restrictions Precautions Precautions: Fall Restrictions Weight Bearing Restrictions: No      Mobility  Bed Mobility Overal bed mobility: Needs Assistance Bed Mobility: Supine to Sit     Supine to sit: Min assist;HOB elevated     General bed mobility comments: Increased time. Small amount of assist for trunk.   Transfers Overall transfer level: Needs assistance Equipment used: Rolling walker (2 wheeled) Transfers: Sit to/from [facility] Sit to Stand: Mod assist;From elevated surface Stand pivot transfers: Min assist       General transfer comment: Assist to rise, stabilize, control descent. VCs safety, technique, hand placement  Ambulation/Gait Ambulation/Gait assistance: Min assist Ambulation Distance (Feet): 4 Feet Assistive device: Rolling walker (2 wheeled) Gait Pattern/deviations: Step-through pattern;Decreased stride length     General Gait Details: slow gait speed. Remained on O2 during session-pt is O2 dependent. 4 steps fwd, 3 steps backward.   Stairs            Wheelchair Mobility    Modified Rankin (Stroke Patients Only)       Balance                                     Pertinent Vitals/Pain No  c/o pain    Home Living Family/patient expects to be discharged to:: Private residence Living Arrangements: Children Available Help at Discharge: Available PRN/intermittently (daughter works days) Type of Home: Apartment Home Access: Level entry     Home Layout: One level [place]: Bedside commode;Walker - 4 wheels;Shower seat      Prior Function Level of Independence: Independent with assistive device(s)         Comments: doesn't wear socks     Hand Dominance        Extremity/Trunk Assessment   Upper Extremity Assessment: Defer to OT evaluation           Lower Extremity Assessment: Generalized weakness      Cervical / Trunk Assessment: Normal  Communication   Communication: No difficulties  Cognition Arousal/Alertness: Awake/alert Behavior During Therapy: WFL for tasks assessed/performed Overall Cognitive Status: Within Functional Limits for tasks assessed                      General Comments      Exercises        Assessment/Plan    PT Assessment Patient needs continued PT services  PT Diagnosis Difficulty walking;Generalized weakness   PT Problem List Decreased strength;Decreased activity tolerance;Decreased balance;Obesity;Decreased knowledge of use of DME;Decreased mobility  PT Treatment Interventions DME instruction;Gait training;Functional mobility training;Therapeutic activities;Therapeutic exercise;Patient/family education;Balance training   PT Goals (Current goals can be found in the Care  Plan section) Acute Rehab PT Goals Patient Stated Goal: home PT Goal Formulation: With patient/family Time For Goal Achievement: 04/13/13 Potential to Achieve Goals: Good    Frequency Min 3X/week   Barriers to discharge        End of Session Equipment Utilized During Treatment: Oxygen Activity Tolerance: Patient tolerated treatment well Patient left: in chair;with call bell/phone within reach         Time: 1107-1147 PT Time  Calculation (min): 40 min   Charges:   PT Evaluation $Initial PT Evaluation Tier I: 1 Procedure PT Treatments $Gait Training: 8-22 mins   PT G Codes:          Weston Anna, MPT Pager: [phone]

## 2013-03-31 ENCOUNTER — Inpatient Hospital Stay (HOSPITAL_COMMUNITY): Payer: Medicare (Managed Care)

## 2013-03-31 ENCOUNTER — Encounter (HOSPITAL_COMMUNITY): Payer: Self-pay | Admitting: Radiology

## 2013-03-31 LAB — COMPREHENSIVE METABOLIC PANEL
ALBUMIN: 2.4 g/dL — AB (ref 3.5–5.2)
ALT: 15 U/L (ref 0–35)
AST: 16 U/L (ref 0–37)
Alkaline Phosphatase: 245 U/L — ABNORMAL HIGH (ref 39–117)
BUN: 14 mg/dL (ref 6–23)
CO2: 34 mEq/L — ABNORMAL HIGH (ref 19–32)
CREATININE: 0.99 mg/dL (ref 0.50–1.10)
Calcium: 9.2 mg/dL (ref 8.4–10.5)
Chloride: 100 mEq/L (ref 96–112)
GFR calc Af Amer: 69 mL/min — ABNORMAL LOW (ref >90)
GFR calc non Af Amer: 59 mL/min — ABNORMAL LOW (ref >90)
Glucose, Bld: 145 mg/dL — ABNORMAL HIGH (ref 70–99)
Potassium: 4.2 mEq/L (ref 3.7–5.3)
Sodium: 143 mEq/L (ref 137–147)
Total Bilirubin: 0.7 mg/dL (ref 0.3–1.2)
Total Protein: 7.5 g/dL (ref 6.0–8.3)

## 2013-03-31 LAB — RETICULOCYTES
RBC.: 3.59 MIL/uL — ABNORMAL LOW (ref 3.87–5.11)
Retic Count, Absolute: 39.5 10*3/uL (ref 19.0–186.0)
Retic Ct Pct: 1.1 % (ref 0.4–3.1)

## 2013-03-31 LAB — GLUCOSE, CAPILLARY
GLUCOSE-CAPILLARY: 158 mg/dL — AB (ref 70–99)
Glucose-Capillary: 143 mg/dL — ABNORMAL HIGH (ref 70–99)
Glucose-Capillary: 191 mg/dL — ABNORMAL HIGH (ref 70–99)
Glucose-Capillary: 192 mg/dL — ABNORMAL HIGH (ref 70–99)

## 2013-03-31 LAB — CBC WITH DIFFERENTIAL/PLATELET
Basophils Absolute: 0 10*3/uL (ref 0.0–0.1)
Basophils Relative: 0 % (ref 0–1)
EOS PCT: 1 % (ref 0–5)
Eosinophils Absolute: 0.2 10*3/uL (ref 0.0–0.7)
HEMATOCRIT: 30.7 % — AB (ref 36.0–46.0)
Hemoglobin: 8.8 g/dL — ABNORMAL LOW (ref 12.0–15.0)
LYMPHS ABS: 2 10*3/uL (ref 0.7–4.0)
LYMPHS PCT: 13 % (ref 12–46)
MCH: 24.5 pg — ABNORMAL LOW (ref 26.0–34.0)
MCHC: 28.7 g/dL — ABNORMAL LOW (ref 30.0–36.0)
MCV: 85.5 fL (ref 78.0–100.0)
MONO ABS: 1.5 10*3/uL — AB (ref 0.1–1.0)
Monocytes Relative: 10 % (ref 3–12)
Neutro Abs: 12 10*3/uL — ABNORMAL HIGH (ref 1.7–7.7)
Neutrophils Relative %: 76 % (ref 43–77)
Platelets: 402 10*3/uL — ABNORMAL HIGH (ref 150–400)
RBC: 3.59 MIL/uL — AB (ref 3.87–5.11)
RDW: 15.2 % (ref 11.5–15.5)
WBC: 15.8 10*3/uL — AB (ref 4.0–10.5)

## 2013-03-31 LAB — PROTIME-INR
INR: 1.29 (ref 0.00–1.49)
PROTHROMBIN TIME: 15.8 s — AB (ref 11.6–15.2)

## 2013-03-31 LAB — IRON AND TIBC
IRON: 10 ug/dL — AB (ref 42–135)
Saturation Ratios: 6 % — ABNORMAL LOW (ref 20–55)
TIBC: 161 ug/dL — ABNORMAL LOW (ref 250–470)
UIBC: 151 ug/dL (ref 125–400)

## 2013-03-31 LAB — FOLATE: Folate: 6 ng/mL

## 2013-03-31 LAB — FERRITIN: FERRITIN: 313 ng/mL — AB (ref 10–291)

## 2013-03-31 LAB — VITAMIN B12: Vitamin B-12: 507 pg/mL (ref 211–911)

## 2013-03-31 LAB — LACTATE DEHYDROGENASE: LDH: 194 U/L (ref 94–250)

## 2013-03-31 LAB — APTT: aPTT: 41 seconds — ABNORMAL HIGH (ref 24–37)

## 2013-03-31 MED ORDER — MIDAZOLAM HCL 2 MG/2ML IJ SOLN
INTRAMUSCULAR | Status: AC
  Filled 2013-03-31: qty 6

## 2013-03-31 MED ORDER — FENTANYL CITRATE 0.05 MG/ML IJ SOLN
INTRAMUSCULAR | Status: AC | PRN
  Administered 2013-03-31: 50 ug via INTRAVENOUS

## 2013-03-31 MED ORDER — HYDROCODONE-ACETAMINOPHEN 5-325 MG PO TABS: 1.0000 | ORAL_TABLET | ORAL | Status: DC | PRN

## 2013-03-31 MED ORDER — MIDAZOLAM HCL 2 MG/2ML IJ SOLN
INTRAMUSCULAR | Status: DC | PRN
  Administered 2013-03-31: 1 mg via INTRAVENOUS

## 2013-03-31 MED ORDER — MIDAZOLAM HCL 2 MG/2ML IJ SOLN
INTRAMUSCULAR | Status: AC
  Filled 2013-03-31: qty 8

## 2013-03-31 MED ORDER — FENTANYL CITRATE 0.05 MG/ML IJ SOLN
INTRAMUSCULAR | Status: AC
  Filled 2013-03-31: qty 6

## 2013-03-31 MED ORDER — MIDAZOLAM HCL 2 MG/2ML IJ SOLN
INTRAMUSCULAR | Status: AC | PRN
  Administered 2013-03-31: 1 mg via INTRAVENOUS

## 2013-03-31 NOTE — Procedures (Signed)
L adrenal mass Bx 18 g core times 5 No comp

## 2013-03-31 NOTE — Progress Notes (Signed)
[facility] PROGRESS NOTE  Yesenia Lyons ERX:[id number] DOB: 08-05-1950 DOA: 03/29/2013 PCP: Reymundo Poll, MD   Brief narrative 63 year old morbidly obese female with history of hypertension, diabetes mellitus admitted to the hospital with several months of weakness, subjective weight loss with fever and night sweats along with a right lower quadrant abdominal pain. Patient found to have right lower quadrant mass seen on CT scan of the abdomen.   Assessment/Plan: Abdominal pain Secondary to right lower quadrant mass seen on CT scan of the abdomen. Symptoms improving. Discussed with IR. Liver lesions are difficult to be his allies and ultrasound. Recommended getting biopsy of the left adrenal which was done today. Tolerated procedure well. Followup with biopsy results as outpatient.  Fever with night sweats and weight loss Given constitutional symptoms with weight loss and lymphadenopathy on imaging symptoms are highly suspicious for lymphoma. She has leukocytosis but without any source of infection. Cultures are so far negative. Patient afebrile for 24 hours and leukocytosis improving. Need to follow biopsy results as outpatient  Pulmonary vascular congestion on chest x-ray Appears euvolemic clinically ProBNP mildly elevated. asymptomatic at present. Hold  further workup.  Anemia Iron panel suggestive of iron deficiency. Possibly secondary to ? underlying malignancy. Check stool for occult blood. Needs GI eval as outpatient.  Hypertension Stable. Continue home medications  Diabetes mellitus Monitor with sliding scale insulin  Morbid obesity Patient reports losing almost 40 pounds since August of 2014  Malnutrition Continue  supplements   Diet: Diabetic DVT prophylaxis: Subcutaneous heparin  Code Status: Full code Family Communication: Daughter at bedside Disposition Plan: home  in a.m.if no further symptoms   Consultants:  IR  Procedures:  Biopsy of left  adrenal mass on 3/25  Antibiotics:  None  HPI/Subjective: Patient seen and examined this morning. Denies any abdominal pain at this time.  Objective: Filed Vitals:   03/31/13 1524  BP: 135/68  Pulse: 68  Temp:   Resp: 20    Intake/Output Summary (Last 24 hours) at 03/31/13 1630 Last data filed at 03/31/13 1440  Gross per 24 hour  Intake    660 ml  Output    753 ml  Net    -93 ml   Filed Weights   03/30/13 0647 03/31/13 0500 03/31/13 0810  Weight: 167.377 kg (369 lb) 168.284 kg (371 lb) 157.081 kg (346 lb 4.8 oz)    Exam:   General:  Elderly morbidly obese female in no acute distress  HEENT: No pallor, moist oral mucosa  Chest: Clear to auscultation bilaterally, no added sounds  CVS: Normal S1 and S2, no murmurs or gallop  Abdomen: Soft, nontender, nondistended, bowel sounds present  Extremities: Warm, no edema  CNS: AAO x3  Data Reviewed: Basic Metabolic Panel:  Recent Labs Lab 03/29/13 1340 03/30/13 0435 03/31/13 0440  NA 138 142 143  K 4.4 4.2 4.2  CL 96 100 100  CO2 29 33* 34*  GLUCOSE 272* 182* 145*  BUN 19 16 14   CREATININE 0.99 0.97 0.99  CALCIUM 9.3 9.3 9.2   Liver Function Tests:  Recent Labs Lab 03/30/13 0435 03/31/13 0440  AST 23 16  ALT 16 15  ALKPHOS 232* 245*  BILITOT 0.8 0.7  PROT 7.4 7.5  ALBUMIN 2.4* 2.4*   No results found for this basename: LIPASE, AMYLASE,  in the last 168 hours No results found for this basename: AMMONIA,  in the last 168 hours CBC:  Recent Labs Lab 03/29/13 1340 03/30/13 0435 03/31/13 0440  WBC 18.4*  19.8* 15.8*  NEUTROABS 14.4*  --  12.0*  HGB 8.5* 8.2* 8.8*  HCT 28.7* 27.6* 30.7*  MCV 82.9 83.6 85.5  PLT 463* 449* 402*   Cardiac Enzymes: No results found for this basename: CKTOTAL, CKMB, CKMBINDEX, TROPONINI,  in the last 168 hours BNP (last 3 results)  Recent Labs  03/29/13 1340  PROBNP 399.1*   CBG:  Recent Labs Lab 03/30/13 1151 03/30/13 1720 03/30/13 2132  03/31/13 0746 03/31/13 1143  GLUCAP 138* 267* 184* 158* 143*    Recent Results (from the past 240 hour(s))  URINE CULTURE     Status: None   Collection Time    03/29/13  1:05 PM      Result Value Ref Range Status   Specimen Description URINE, CATHETERIZED   Final   Special Requests Normal   Final   Culture  Setup Time     Final   Value: 03/29/2013 16:44     Performed at [facility]     Final   Value: NO GROWTH     Performed at [facility]   Culture     Final   Value: NO GROWTH     Performed at [facility]   Report Status 03/30/2013 FINAL   Final  CULTURE, BLOOD (ROUTINE X 2)     Status: None   Collection Time    03/29/13  1:40 PM      Result Value Ref Range Status   Specimen Description BLOOD RIGHT ANTECUBITAL   Final   Special Requests BOTTLES DRAWN AEROBIC AND ANAEROBIC 5 CC EA   Final   Culture  Setup Time     Final   Value: 03/29/2013 16:28     Performed at [facility]   Culture     Final   Value:        BLOOD CULTURE RECEIVED NO GROWTH TO DATE CULTURE WILL BE HELD FOR 5 DAYS BEFORE ISSUING A FINAL NEGATIVE REPORT     Performed at [facility]   Report Status PENDING   Incomplete  CULTURE, BLOOD (ROUTINE X 2)     Status: None   Collection Time    03/29/13  1:50 PM      Result Value Ref Range Status   Specimen Description BLOOD LEFT ANTECUBITAL   Final   Special Requests BOTTLES DRAWN AEROBIC AND ANAEROBIC 5ML   Final   Culture  Setup Time     Final   Value: 03/29/2013 16:29     Performed at [facility]   Culture     Final   Value:        BLOOD CULTURE RECEIVED NO GROWTH TO DATE CULTURE WILL BE HELD FOR 5 DAYS BEFORE ISSUING A FINAL NEGATIVE REPORT     Performed at [facility]   Report Status PENDING   Incomplete     Studies: Ct Abdomen Pelvis Wo Contrast  03/29/2013   CLINICAL DATA:  Shortness of breath. Weight loss. Abdominal pain. Fever. Leukocytosis.  EXAM: CT ABDOMEN AND PELVIS  WITHOUT CONTRAST  TECHNIQUE: Multidetector CT imaging of the abdomen and pelvis was performed following the standard protocol without intravenous contrast.  COMPARISON:  None.  FINDINGS: Body habitus reduces diagnostic sensitivity and specificity.  Subsegmental atelectasis and indistinct ground-glass opacities in both lower lobes. Mild cardiomegaly. Indistinct abnormal areas of hypodensity are present in the liver, including a potential 5.1 cm region of hypodensity in the right hepatic lobe  on image 22 of series 2.  The spleen, pancreas, and right adrenal gland appear normal. In the expected vicinity of the left adrenal gland, a 4.7 x 4.9 by 4.2 cm mildly hyperdense mass is present. Internal density 38 Hounsfield units. This is tangential to the upper pole of the left kidney.  The kidneys appear otherwise unremarkable. Gastrohepatic ligament lymph node short axis diameter 1.4 cm. Suspected porta hepatis adenopathy. Celiac trunk node 1.1 cm, image 32 of series 2.  Left perirenal lymph node along the retroperitoneal, 1.4 cm in short axis, image 41 of series 2. Aortic caval lymph node 1.4 cm in short axis, image 42 of series 2.  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 Multiple loops of adjacent small bowel are present along the margin of this lesion.  Trace free pelvic fluid eccentric to the right. Mild pelvic floor laxity with the anorectal junction well below the pubococcygeal line. Lower lumbar facet arthropathy.  No renal calculi or hydronephrosis.  No ureteral calculus observed.  IMPRESSION: 1. Right lower quadrant mass with internal calcification and probable cicatricial response, confluent with central mesenteric adenopathy. Scattered retroperitoneal, mesenteric, porta hepatis, and gastrohepatic ligament adenopathy. Faint  hypodensities in the liver suspicious for liver masses. There is also a mass in the vicinity of the left adrenal gland measuring up to 4.9 cm. Top differential diagnostic considerations include the carcinoid tumor with adenopathy and hepatic metastatic disease; lymphoma; colon cancer ; or gastrointestinal stromal tumor. Consider correlation with markers such is 5-HIAA and chromogranin A. Tissue diagnosis suggested. 2. The left adrenal / suprarenal lesion is nonspecific and could represent confluent adenopathy, adrenal mass, or less likely exophytic renal cell carcinoma from the upper pole of the left kidney. 3. Ground-glass opacities in both lower lobes suggest edema. Mild cardiomegaly noted. There is also mild bibasilar subsegmental atelectasis. 4. Trace free pelvic fluid.  Mild pelvic floor laxity.   Electronically Signed   By: Sherryl Barters M.D.   On: 03/29/2013 17:24    Scheduled Meds: . amLODipine  10 mg Oral Daily  . atenolol  50 mg Oral Daily  . benazepril  20 mg Oral Daily  . fluticasone  1 puff Inhalation BID  . gabapentin  400 mg Oral TID  . heparin  5,000 Units Subcutaneous 3 times per day  . insulin aspart  0-15 Units Subcutaneous TID WC   Continuous Infusions:     Time spent: 25 minutes    Yesenia Lyons  [facility] Pager [phone] If 7PM-7AM, please contact night-coverage at [email or web address], password [email or web address] 03/31/2013, 4:30 PM  LOS: 2 days

## 2013-03-31 NOTE — Progress Notes (Signed)
PT Cancellation Note  Patient Details Name: Yesenia Lyons MRN: [id number] DOB: 08-09-1950   Cancelled Treatment:    Reason Eval/Treat Not Completed: Patient at procedure or test/unavailable (will check back another time/day. )   Weston Anna, MPT Pager: [phone]

## 2013-03-31 NOTE — Progress Notes (Signed)
Patient ID: Yesenia Lyons, female   DOB: [birth year], 63 y.o.   MRN: [id number] Pt's recent imaging studies were reviewed by Dr. Barbie Banner today. Liver lesions are difficult to visualize by [place]. Will plan to perform CT guided left RP/? adrenal mass biopsy today instead. Above d/w pt/daughter with their understanding and consent.

## 2013-03-31 NOTE — Progress Notes (Signed)
OT Cancellation Note  Patient Details Name: Sophira Rumler MRN: [id number] DOB: Aug 17, 1950   Cancelled Treatment:    Reason Eval/Treat Not Completed: Other (comment)  Noted biopsy planned for today.  Will check back tomorrow.  Laveah Gloster 03/31/2013, 2:02 PM Lesle Chris, OTR/L [phone] 03/31/2013

## 2013-04-01 DIAGNOSIS — R6510 Systemic inflammatory response syndrome (SIRS) of non-infectious origin without acute organ dysfunction: Secondary | ICD-10-CM

## 2013-04-01 LAB — CBC
HCT: 27.6 % — ABNORMAL LOW (ref 36.0–46.0)
Hemoglobin: 7.8 g/dL — ABNORMAL LOW (ref 12.0–15.0)
MCH: 24.5 pg — ABNORMAL LOW (ref 26.0–34.0)
MCHC: 28.3 g/dL — AB (ref 30.0–36.0)
MCV: 86.5 fL (ref 78.0–100.0)
Platelets: 446 10*3/uL — ABNORMAL HIGH (ref 150–400)
RBC: 3.19 MIL/uL — ABNORMAL LOW (ref 3.87–5.11)
RDW: 15.3 % (ref 11.5–15.5)
WBC: 11.1 10*3/uL — AB (ref 4.0–10.5)

## 2013-04-01 LAB — GLUCOSE, CAPILLARY
Glucose-Capillary: 159 mg/dL — ABNORMAL HIGH (ref 70–99)
Glucose-Capillary: 183 mg/dL — ABNORMAL HIGH (ref 70–99)
Glucose-Capillary: 180 mg/dL — ABNORMAL HIGH (ref 70–99)

## 2013-04-01 MED ORDER — HYDROCODONE-ACETAMINOPHEN 5-325 MG PO TABS: 2.0000 | ORAL_TABLET | Freq: Four times a day (QID) | ORAL | Status: DC | PRN

## 2013-04-01 MED ORDER — ENSURE COMPLETE PO LIQD
237.0000 mL | Freq: Two times a day (BID) | ORAL | Status: DC
  Administered 2013-04-01: 237 mL via ORAL

## 2013-04-01 MED ORDER — FERROUS GLUCONATE 324 (38 FE) MG PO TABS
324.0000 mg | ORAL_TABLET | Freq: Two times a day (BID) | ORAL | Status: DC
  Administered 2013-04-01 (×2): 324 mg via ORAL
  Filled 2013-04-01 (×3): qty 1

## 2013-04-01 MED ORDER — FERROUS GLUCONATE 324 (38 FE) MG PO TABS: 324.0000 mg | ORAL_TABLET | Freq: Two times a day (BID) | ORAL | Status: DC

## 2013-04-01 MED ORDER — ENSURE COMPLETE PO LIQD
237.0000 mL | Freq: Two times a day (BID) | ORAL | Status: AC
Start: 2013-04-01 — End: ?

## 2013-04-01 NOTE — Progress Notes (Signed)
Notified that patient and daughter upset regarding discharge. Upon entering room, patient stated that she was fine. Daughter states that her concerns were that the cover sheet from information that she'd previously sent was on the desk of one of the computer stations. In addition, her mother was being discharged and no one explained any findings to her. She also stated that the MD had called medications in to [facility] in [place]. I apologized for their experience and answered all questions that they had regarding discharge plans. Pt. Still feels that she should not go home tonight because they are awaiting biopsy results. MD text paged, awaiting response.

## 2013-04-01 NOTE — Discharge Summary (Signed)
Physician Discharge Summary  Yesenia Lyons [id number] DOB: [birth year] DOA: 03/29/2013  PCP: Reymundo Poll, MD  Admit date: 03/29/2013 Discharge date: 04/01/2013  Time spent: 40 minutes  Recommendations for Outpatient Follow-up:  1. Home with HHPT and RN. Follow up with PCP in 1 week 2. Please folow left adrenal biopsy result and make necessary follow up and test arrangements as outpatient. 3. Please refer patient to outpatient GI for evaluation of her iron deficiency anemia.  Discharge Diagnoses:  Principal Problem:   SIRS (systemic inflammatory response syndrome)  Active Problems:   Abdominal pain   CHF (congestive heart failure)   Diabetes mellitus   Weight loss, non-intentional   HTN (hypertension)   Obesity   Fever   Protein-calorie malnutrition, severe   Discharge Condition: FAIR  Diet recommendation: diabetic  Filed Weights   03/31/13 0500 03/31/13 0810 04/01/13 0600  Weight: 168.284 kg (371 lb) 157.081 kg (346 lb 4.8 oz) 346.9 kg (764 lb 12.4 oz)    History of present illness:  Please refer to admission H&P for details, but in brief, 63 year old morbidly obese female with history of hypertension, diabetes mellitus admitted to the hospital with several months of weakness, subjective weight loss with fever and night sweats along with a right lower quadrant abdominal pain. Patient found to have right lower quadrant mass seen on CT scan of the abdomen.   Hospital Course:  Abdominal pain  Secondary to right lower quadrant mass seen on CT scan of the abdomen. Symptoms improving. Discussed with IR. Liver lesions are difficult to visualize ultrasound. Recommended getting biopsy of the left adrenal which was done on 3/25 by IR. Tolerated procedure well.  Followup with biopsy results as outpatient.    Fever with night sweats and weight loss  Given constitutional symptoms with weight loss and lymphadenopathy on imaging symptoms are highly suspicious for lymphoma.  She has  leukocytosis but without any source of infection.  . Cultures are so far negative.  Patient afebrile for past 48 hours and leukocytosis improving to normal .  Need to follow biopsy results as outpatient   Pulmonary vascular congestion on chest x-ray  Appears euvolemic clinically  ProBNP mildly elevated. asymptomatic at present. Hold further workup.   Anemia  Iron panel suggestive of iron deficiency. Possibly secondary to ? underlying malignancy. Needs GI eval as outpatient. Started on ferrous gluconate.  Hypertension  Stable. Continue home medications   Diabetes mellitus  Resume home insulin  Morbid obesity  Patient reports losing almost 40 pounds since August of 2014   Malnutrition  Continue supplements   Diet: Diabetic   Code Status: Full code  Family Communication: Daughter Adonis Huguenin over the phone  Disposition Plan: recommended SNF by PT given patient;s poor ambulatory status adn being homebound alone all day while daughter is at work. Patient and daughter refused SNF. Will discharge home with [facility] and PT  Consultants:  IR   Procedures:  Biopsy of left adrenal mass on 3/25   Antibiotics:  None   Discharge Exam: Filed Vitals:   04/01/13 1013  BP: 113/73  Pulse: 66  Temp:   Resp:     General: Elderly morbidly obese female in no acute distress  HEENT: No pallor, moist oral mucosa  Chest: Clear to auscultation bilaterally, no added sounds  CVS: Normal S1 and S2, no murmurs or gallop  Abdomen: Soft, nontender, nondistended, bowel sounds present  Extremities: Warm, no edema  CNS: AAO x3   Discharge Instructions     Medication List  acetaminophen 650 MG CR tablet  Commonly known as:  TYLENOL  Take 650 mg by mouth every 8 (eight) hours as needed for pain.     albuterol 108 (90 BASE) MCG/ACT inhaler  Commonly known as:  PROVENTIL HFA;VENTOLIN HFA  Inhale 2 puffs into the lungs every 6 (six) hours as needed for wheezing.      amLODipine-benazepril 10-20 MG per capsule  Commonly known as:  LOTREL  Take 1 capsule by mouth daily.     atenolol 50 MG tablet  Commonly known as:  TENORMIN  Take 50 mg by mouth daily.     beclomethasone 80 MCG/ACT inhaler  Commonly known as:  QVAR  Inhale 1 puff into the lungs as needed. breathing     CINNAMON PLUS CHROMIUM [phone] MCG-MG Caps  Generic drug:  Chromium-Cinnamon  Take 1 capsule by mouth daily.     feeding supplement (ENSURE COMPLETE) Liqd  Take 237 mLs by mouth 2 (two) times daily between meals.     ferrous gluconate 324 MG tablet  Commonly known as:  FERGON  Take 1 tablet (324 mg total) by mouth 2 (two) times daily with a meal.     furosemide 40 MG tablet  Commonly known as:  LASIX  Take 1 tablet (40 mg total) by mouth daily.     gabapentin 400 MG capsule  Commonly known as:  NEURONTIN  Take 400 mg by mouth 3 (three) times daily.     Ginger Root 550 MG Caps  Take 1 capsule by mouth daily.     HM CRANBERRY SUPER STRENGTH 300 MG tablet  Generic drug:  Cranberry  Take 300 mg by mouth 2 (two) times daily.     HYDROcodone-acetaminophen 5-325 MG per tablet  Commonly known as:  NORCO/VICODIN  Take 2 tablets by mouth every 6 (six) hours as needed for moderate pain.     insulin aspart 100 UNIT/ML injection  Commonly known as:  novoLOG  Inject 40-50 Units into the skin 3 (three) times daily before meals. Inject 50 units at breakfast and 40 units at  lunch and 40 units at dinner. Pt on sliding scale     spironolactone 25 MG tablet  Commonly known as:  ALDACTONE  Take 25 mg by mouth daily.       Allergies  Allergen Reactions  . Penicillins Itching and Swelling       Follow-up Information   Follow up with Reymundo Poll, MD In 1 week. (ned to follow biopsy result)    Specialty:  Family Medicine   Contact information:   [address] [place] [address] [phone]        The results of significant diagnostics from this  hospitalization (including imaging, microbiology, ancillary and laboratory) are listed below for reference.    Significant Diagnostic Studies: Ct Abdomen Pelvis Wo Contrast  03/29/2013   CLINICAL DATA:  Shortness of breath. Weight loss. Abdominal pain. Fever. Leukocytosis.  EXAM: CT ABDOMEN AND PELVIS WITHOUT CONTRAST  TECHNIQUE: Multidetector CT imaging of the abdomen and pelvis was performed following the standard protocol without intravenous contrast.  COMPARISON:  None.  FINDINGS: Body habitus reduces diagnostic sensitivity and specificity.  Subsegmental atelectasis and indistinct ground-glass opacities in both lower lobes. Mild cardiomegaly. Indistinct abnormal areas of hypodensity are present in the liver, including a potential 5.1 cm region of hypodensity in the right hepatic lobe on image 22 of series 2.  The spleen, pancreas, and right adrenal gland appear normal. In the  expected vicinity of the left adrenal gland, a 4.7 x 4.9 by 4.2 cm mildly hyperdense mass is present. Internal density 38 Hounsfield units. This is tangential to the upper pole of the left kidney.  The kidneys appear otherwise unremarkable. Gastrohepatic ligament lymph node short axis diameter 1.4 cm. Suspected porta hepatis adenopathy. Celiac trunk node 1.1 cm, image 32 of series 2.  Left perirenal lymph node along the retroperitoneal, 1.4 cm in short axis, image 41 of series 2. Aortic caval lymph node 1.4 cm in short axis, image 42 of series 2.  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 Multiple loops of adjacent small bowel are present along the margin of this lesion.  Trace free pelvic fluid eccentric to the right. Mild pelvic floor laxity with the anorectal junction well below the pubococcygeal line. Lower lumbar facet arthropathy.  No renal  calculi or hydronephrosis.  No ureteral calculus observed.  IMPRESSION: 1. Right lower quadrant mass with internal calcification and probable cicatricial response, confluent with central mesenteric adenopathy. Scattered retroperitoneal, mesenteric, porta hepatis, and gastrohepatic ligament adenopathy. Faint hypodensities in the liver suspicious for liver masses. There is also a mass in the vicinity of the left adrenal gland measuring up to 4.9 cm. Top differential diagnostic considerations include the carcinoid tumor with adenopathy and hepatic metastatic disease; lymphoma; colon cancer ; or gastrointestinal stromal tumor. Consider correlation with markers such is 5-HIAA and chromogranin A. Tissue diagnosis suggested. 2. The left adrenal / suprarenal lesion is nonspecific and could represent confluent adenopathy, adrenal mass, or less likely exophytic renal cell carcinoma from the upper pole of the left kidney. 3. Ground-glass opacities in both lower lobes suggest edema. Mild cardiomegaly noted. There is also mild bibasilar subsegmental atelectasis. 4. Trace free pelvic fluid.  Mild pelvic floor laxity.   Electronically Signed   By: Sherryl Barters M.D.   On: 03/29/2013 17:24   US Abdomen Limited  03/31/2013   CLINICAL DATA:  Possible liver lesions  EXAM: LIMITED ABDOMINAL ULTRASOUND  COMPARISON:  CT ABD/PELV WO CM dated 03/29/2013  FINDINGS: Liver lesions cannot be identified. The liver is diffusely heterogeneous.  IMPRESSION: Focal liver lesions could not be identified by ultrasound.   Electronically Signed   By: Maryclare Bean M.D.   On: 03/31/2013 16:41   Ct Biopsy  03/31/2013   CLINICAL DATA:  Left adrenal mass  EXAM: CT-GUIDED BIOPSY OF A LEFT ADRENAL MASS.  CORE.  MEDICATIONS AND MEDICAL HISTORY: Versed two mg, Fentanyl 50 mcg.  Additional Medications: None.  ANESTHESIA/SEDATION: Moderate sedation time: 8 minutes  PROCEDURE: The procedure, risks, benefits, and alternatives were explained to the patient.  Questions regarding the procedure were encouraged and answered. The patient understands and consents to the procedure.  The back was prepped with beta in a sterile fashion, and a sterile drape was applied covering the operative field. A sterile gown and sterile gloves were used for the procedure.  Under CT guidance, a 17 gauge needle was inserted into the left adrenal mass. Five 18 gauge core biopsies were obtained. The guide needle was rim. Final imaging was performed.  Patient tolerated the procedure well without complication. Vital sign monitoring by nursing staff during the procedure will continue as patient is in the special procedures unit for post procedure observation.  FINDINGS: The images document guide needle placement within the left  adrenal mass. Post biopsy images demonstrate no hemorrhage.  IMPRESSION: Successful CT-guided core biopsy of a left adrenal mass.   Electronically Signed   By: Maryclare Bean M.D.   On: 03/31/2013 16:55   Dg Chest Port 1 View  03/29/2013   CLINICAL DATA:  History CHF, diabetes and high blood pressure. Shortness breath and chest tightness.  EXAM: PORTABLE CHEST - 1 VIEW  COMPARISON:  10/29/2011  FINDINGS: Poor inspiration.  Elevated right hemidiaphragm.  Cardiomegaly.  Pulmonary vascular congestion/ mild congestive heart failure.  No segmental consolidation or mass identified. Evaluation of the right lung base limited by the elevated right hemidiaphragm.  The patient would eventually benefit from two view chest.  IMPRESSION: Cardiomegaly.  Pulmonary vascular congestion/mild congestive heart failure.  Please see above.   Electronically Signed   By: Chauncey Cruel M.D.   On: 03/29/2013 13:02    Microbiology: Recent Results (from the past 240 hour(s))  URINE CULTURE     Status: None   Collection Time    03/29/13  1:05 PM      Result Value Ref Range Status   Specimen Description URINE, CATHETERIZED   Final   Special Requests Normal   Final   Culture  Setup Time     Final    Value: 03/29/2013 16:44     Performed at [facility]     Final   Value: NO GROWTH     Performed at [facility]   Culture     Final   Value: NO GROWTH     Performed at [facility]   Report Status 03/30/2013 FINAL   Final  CULTURE, BLOOD (ROUTINE X 2)     Status: None   Collection Time    03/29/13  1:40 PM      Result Value Ref Range Status   Specimen Description BLOOD RIGHT ANTECUBITAL   Final   Special Requests BOTTLES DRAWN AEROBIC AND ANAEROBIC 5 CC EA   Final   Culture  Setup Time     Final   Value: 03/29/2013 16:28     Performed at [facility]   Culture     Final   Value:        BLOOD CULTURE RECEIVED NO GROWTH TO DATE CULTURE WILL BE HELD FOR 5 DAYS BEFORE ISSUING A FINAL NEGATIVE REPORT     Performed at [facility]   Report Status PENDING   Incomplete  CULTURE, BLOOD (ROUTINE X 2)     Status: None   Collection Time    03/29/13  1:50 PM      Result Value Ref Range Status   Specimen Description BLOOD LEFT ANTECUBITAL   Final   Special Requests BOTTLES DRAWN AEROBIC AND ANAEROBIC 5ML   Final   Culture  Setup Time     Final   Value: 03/29/2013 16:29     Performed at [facility]   Culture     Final   Value:        BLOOD CULTURE RECEIVED NO GROWTH TO DATE CULTURE WILL BE HELD FOR 5 DAYS BEFORE ISSUING A FINAL NEGATIVE REPORT     Performed at [facility]   Report Status PENDING   Incomplete     Labs: Basic Metabolic Panel:  Recent Labs Lab 03/29/13 1340 03/30/13 0435 03/31/13 0440  NA 138 142 143  K 4.4 4.2 4.2  CL 96 100 100  CO2 29 33* 34*  GLUCOSE 272* 182* 145*  BUN 19 16 14   CREATININE 0.99 0.97 0.99  CALCIUM 9.3 9.3 9.2   Liver Function Tests:  Recent Labs Lab 03/30/13 0435 03/31/13 0440  AST 23 16  ALT 16 15  ALKPHOS 232* 245*  BILITOT 0.8 0.7  PROT 7.4 7.5  ALBUMIN 2.4* 2.4*   No results found for this basename: LIPASE, AMYLASE,  in the last 168 hours No results  found for this basename: AMMONIA,  in the last 168 hours CBC:  Recent Labs Lab 03/29/13 1340 03/30/13 0435 03/31/13 0440 04/01/13 0450  WBC 18.4* 19.8* 15.8* 11.1*  NEUTROABS 14.4*  --  12.0*  --   HGB 8.5* 8.2* 8.8* 7.8*  HCT 28.7* 27.6* 30.7* 27.6*  MCV 82.9 83.6 85.5 86.5  PLT 463* 449* 402* 446*   Cardiac Enzymes: No results found for this basename: CKTOTAL, CKMB, CKMBINDEX, TROPONINI,  in the last 168 hours BNP: BNP (last 3 results)  Recent Labs  03/29/13 1340  PROBNP 399.1*   CBG:  Recent Labs Lab 03/31/13 1143 03/31/13 1709 03/31/13 2235 04/01/13 0739 04/01/13 1137  GLUCAP 143* 191* 192* 159* 183*       Signed:  Zephyr Lyons  [facility] 04/01/2013, 3:23 PM

## 2013-04-01 NOTE — Progress Notes (Signed)
Clinical Social Work  CSW met with patient at bedside. CSW introduced myself and explained that CSW had met with dtr when patient was out of room. Patient reports she is feeling better and hopeful to get to DC home soon. Patient reports that she has been living with dtr and things are going well. Patient reports she does well during the day when dtr is at work and uses her walker to ambulate. Patient states that she would never return to SNF again and does not need HH. Patient states that she feels most comfortable at home and does not need placement again. CSW inquired about patient reconsidering HH to at least have evaluation. Patient reports she will consider HH. CSW made CM aware of possible needs.    , LCSW [phone] 

## 2013-04-01 NOTE — Care Management Note (Signed)
    Page 1 of 1   04/01/2013     1:48:24 PM   CARE MANAGEMENT NOTE 04/01/2013  Patient:  Yesenia Lyons, Yesenia Lyons   Account Number:  [id number]  Date Initiated:  04/01/2013  Documentation initiated by:  [facility]  Subjective/Objective Assessment:   63 year old female admitted with abd pain, fever and weight loss.     Action/Plan:   From home with daughter. Final PT evaluation pending to help determine HH vs SNF at d/c.   Anticipated DC Date:  04/04/2013   Anticipated DC Plan:  [place]  CM consult      Choice offered to / List presented to:             Status of service:  In process, will continue to follow Medicare Important Message given?  NA - LOS <3 / Initial given by admissions (If response is "NO", the following Medicare IM given date fields will be blank) Date Medicare IM given:   Date Additional Medicare IM given:    Discharge Disposition:    Per UR Regulation:  Reviewed for med. necessity/level of care/duration of stay  If discussed at [place] of Stay Meetings, dates discussed:    Comments:  04/01/13 Allene Dillon RN BSN Met with pt at bedside to discuss d/c needs. She is alert and oriented. PT recommended SNF but she declined.  She stated she feels she is functioning at baseline and does not feel she has any d/c needs. I informed her that [facility] services can be ordered by her PCP once she gets home and needs them.

## 2013-04-01 NOTE — Progress Notes (Signed)
Physical Therapy Treatment Patient Details Name: Yesenia Lyons MRN: [id number] DOB: 1950/08/13 Today's Date: 19-Apr-2013    History of Present Illness 63 yo female admitted with abdominal pain, UTI. hx of morbid obesity, HTN, DM, CHF.  s/p biopsy for L adrenal mass 04/20/22    PT Comments    Pt assisted into bathroom and then ambulated short distance in hallway.  Pt required one seated rest break due to fatigue.  Follow Up Recommendations  SNF (pt prefers home however, so HHPT if home)     Equipment Recommendations  None recommended by PT    Recommendations for Other Services       Precautions / Restrictions Precautions Precautions: Fall    Mobility  Bed Mobility Overal bed mobility: Needs Assistance Bed Mobility: Supine to Sit     Supine to sit: Modified independent (Device/Increase time);HOB elevated     General bed mobility comments: increased time, use of bed rail  Transfers Overall transfer level: Needs assistance Equipment used: Rolling walker (2 wheeled) Transfers: Sit to/from Stand Sit to Stand: Min guard         General transfer comment: min/guard for safety, verbal cues for safe technique  Ambulation/Gait Ambulation/Gait assistance: Min guard Ambulation Distance (Feet): 50 Feet Assistive device: Rolling walker (2 wheeled) Gait Pattern/deviations: Step-through pattern;Decreased stride length;Wide base of support     General Gait Details: decreased gait speed, remained on O2 [place] during session, pt ambulated into bathroom to use commode and then ambulated in hallway with seated rest break after 25 feet   Stairs            Wheelchair Mobility    Modified Rankin (Stroke Patients Only)       Balance                                    Cognition Arousal/Alertness: Awake/alert Behavior During Therapy: WFL for tasks assessed/performed Overall Cognitive Status: Within Functional Limits for tasks assessed                      Exercises General Exercises - Lower Extremity Ankle Circles/Pumps: AROM;Both;20 reps Heel Slides: AROM;Both;10 reps    General Comments        Pertinent Vitals/Pain Remained on O2 [place], reports chronic back pain, repositioned in [place]                      Prior Function            PT Goals (current goals can now be found in the care plan section) Progress towards PT goals: Progressing toward goals    Frequency  Min 3X/week    PT Plan Current plan remains appropriate    End of Session Equipment Utilized During Treatment: Oxygen;Gait belt Activity Tolerance: Patient tolerated treatment well Patient left: in chair;with call bell/phone within reach     Time: 0931-0959 PT Time Calculation (min): 28 min  Charges:  $Gait Training: 23-37 mins                    G Codes:      Yesenia Lyons,Yesenia Lyons Apr 19, 2013, 12:46 PM Yesenia Lyons, PT, DPT 04/19/13 Pager: [phone]

## 2013-04-01 NOTE — Progress Notes (Signed)
04/01/2013 A. Sydnee Lamour RNCM 1832pm EDCM consulted by [place] for home health needs at home.  EDCM spoke to patient at bedside on [place].  Patient reports she lives at home with her daughter.  Patient currently does not have home health services at this time.  Patient wears oxygen at home which is supplied by [facility].  Patient reports she has had [facility] in the past and has chosen [facility] for home health RN, PT, OT.  Patient reports she has a walker at home and does not require any more equipment.  EDCM also provided list of private duty nursing services and information regarding senior resources of [place].  Patient is agreeablr to home health services.  [facility] faxed home health orders and patient information  to [place] at 1903pm withi confirmation of receipt at 1906pm.  Patient confirmed her address and phone numbert o be correct on the face sheet.  Patient ocnfirmed her pcp is Dr. Reymundo Poll.  Patient and family thankful for services.  EDCM discussed patient with [place].  No further EDCM needs at this time.

## 2013-04-01 NOTE — Progress Notes (Signed)
DC instructions reviewed with patient and her daughter.  Home med rec carefully reviewed.  AVS marked with the time of next medications due.  Patient to f/u with PCP in 1 week for Biopsy results.  CM came and set pt up with [facility].  3 Rx given- Vicodin, Iron, and Ensure.  Daughter did not bring patient's home O2 tank, however, she reported that she did not have far to go and that the patient would be okay without it until she got home.  RN offered to let daughter go home to get the tank prior to her leaving.  Daughter refused.  Patient to be dc home with daughter.  Patient taken to the car with our O2 tank and walker to assist her in getting in the car.  No changes noted since am assessment.  IV DC'ed.  Dressing to back removed and site appears unremarkable.  Patient and daughter denied questions and reported understanding of all teachings.

## 2013-04-01 NOTE — Progress Notes (Signed)
Chaplain introduced spiritual care as resource and provided support with pt around impending biopsy results.  Pt describes having peace about whatever her next steps are.  Pt's daughter and grandson not present.  Chaplain and pt discussed how pt saw her role in supporting her loved ones.  Will continue to follow for support as needed.  Will attempt to make contact with pt's daughter.   Please page as needs arise.  Jerene Pitch [place]

## 2013-04-01 NOTE — Progress Notes (Signed)
Occupational Therapy Treatment Patient Details Name: Yesenia Lyons MRN: [id number] DOB: [birth year] Today's Date: 04/01/2013    History of present illness 63 yo female admitted with abdominal pain, UTI. hx of morbid obesity, HTN, DM, CHF.  s/p biopsy for L adrenal mass 3/26   OT comments  Pt was mod I ambulating to bathroom with RW.  No cues needed.  Demonstrated sidestepping through tight space, but this was difficult with wider walker (double wheels).  Pt needs to be able to get to bathroom when daughter is working and feels near baseline  Follow Up Recommendations  No OT follow up    Equipment Recommendations  None recommended by OT    Recommendations for Other Services      Precautions / Restrictions Precautions Precautions: Fall       Mobility Bed Mobility Overal bed mobility: Needs Assistance Bed Mobility: Supine to Sit      Sit to supine: Min assist (assist for LLE back into bed.  Mostly lifted RLE herself.  )   General bed mobility comments: increased time flat bed no rails  Transfers Overall transfer level: Needs assistance Equipment used: Rolling walker (2 wheeled) Transfers: Sit to/from Stand Sit to Stand: Modified independent (Device/Increase time)         General transfer comment: safe technique with sit to stand from recliner and bed    Balance                                   ADL             Toilet Transfer: Modified Independent;BSC;Ambulation Toileting- Clothing Manipulation and Hygiene: Set up;Sit to/from stand            Vision                     Perception     Praxis      Cognition   Behavior During Therapy: [facility] for tasks assessed/performed Overall Cognitive Status: Within Functional Limits for tasks assessed                       Extremity/Trunk Assessment               Exercises      General Comments      Pertinent Vitals/ Pain       No c/o pain  Home Living                                           Prior Functioning/Environment              Frequency       Progress Toward Goals  OT Goals(current goals can now be found in the care plan section)  Progress towards OT goals: Goals met/education completed, patient discharged from OT (Pt states she has cloths within reach at home for hygiene)     Plan      End of Session    Activity Tolerance Patient tolerated treatment well   Patient Left in bed;with call bell/phone within reach   Nurse Communication          Time: 7829-5621 OT Time Calculation (min): 28 min  Charges: OT General Charges $OT Visit: 1 Procedure OT Treatments $Self Care/Home Management : 23-37 mins  Daeron Carreno 04/01/2013,  3:49 PM   Lesle Chris, OTR/L [phone] 04/01/2013

## 2013-04-02 NOTE — Progress Notes (Signed)
04/02/2013 A. Carlisle Enke RNCM 1518pm EDCM spoke to [place] home transition specialist from [place] who reports they have received patient referral but didnot receive patient's insurance information.  [facility] faxed patient's face sheet with insurance information again to fax number [phone].  No further EDCM needs at this time.

## 2013-04-04 LAB — CULTURE, BLOOD (ROUTINE X 2)
Culture: NO GROWTH
Culture: NO GROWTH

## 2013-08-07 DIAGNOSIS — D3A00 Benign carcinoid tumor of unspecified site: Secondary | ICD-10-CM

## 2013-08-07 HISTORY — DX: Benign carcinoid tumor of unspecified site: D3A.00

## 2013-08-23 ENCOUNTER — Emergency Department (HOSPITAL_COMMUNITY): Payer: Medicare (Managed Care)

## 2013-08-23 ENCOUNTER — Inpatient Hospital Stay (HOSPITAL_COMMUNITY)
Admission: EM | Admit: 2013-08-23 | Discharge: 2013-08-28 | DRG: 436 | Disposition: A | Discharge status: Home / Self Care | Payer: Medicare (Managed Care) | Attending: Family Medicine | Admitting: Family Medicine

## 2013-08-23 ENCOUNTER — Encounter (HOSPITAL_COMMUNITY): Payer: Self-pay | Admitting: Emergency Medicine

## 2013-08-23 DIAGNOSIS — E785 Hyperlipidemia, unspecified: Secondary | ICD-10-CM | POA: Diagnosis present

## 2013-08-23 DIAGNOSIS — I5030 Unspecified diastolic (congestive) heart failure: Secondary | ICD-10-CM | POA: Diagnosis present

## 2013-08-23 DIAGNOSIS — E119 Type 2 diabetes mellitus without complications: Secondary | ICD-10-CM | POA: Diagnosis present

## 2013-08-23 DIAGNOSIS — Z880 Allergy status to penicillin: Secondary | ICD-10-CM

## 2013-08-23 DIAGNOSIS — R109 Unspecified abdominal pain: Secondary | ICD-10-CM

## 2013-08-23 DIAGNOSIS — Z803 Family history of malignant neoplasm of breast: Secondary | ICD-10-CM

## 2013-08-23 DIAGNOSIS — I498 Other specified cardiac arrhythmias: Secondary | ICD-10-CM | POA: Diagnosis present

## 2013-08-23 DIAGNOSIS — J449 Chronic obstructive pulmonary disease, unspecified: Secondary | ICD-10-CM | POA: Diagnosis present

## 2013-08-23 DIAGNOSIS — K5900 Constipation, unspecified: Secondary | ICD-10-CM | POA: Diagnosis present

## 2013-08-23 DIAGNOSIS — Z6841 Body Mass Index (BMI) 40.0 and over, adult: Secondary | ICD-10-CM

## 2013-08-23 DIAGNOSIS — E1165 Type 2 diabetes mellitus with hyperglycemia: Secondary | ICD-10-CM

## 2013-08-23 DIAGNOSIS — R599 Enlarged lymph nodes, unspecified: Secondary | ICD-10-CM | POA: Diagnosis present

## 2013-08-23 DIAGNOSIS — E7800 Pure hypercholesterolemia, unspecified: Secondary | ICD-10-CM | POA: Diagnosis present

## 2013-08-23 DIAGNOSIS — R6510 Systemic inflammatory response syndrome (SIRS) of non-infectious origin without acute organ dysfunction: Secondary | ICD-10-CM

## 2013-08-23 DIAGNOSIS — Z79899 Other long term (current) drug therapy: Secondary | ICD-10-CM | POA: Diagnosis not present

## 2013-08-23 DIAGNOSIS — G4730 Sleep apnea, unspecified: Secondary | ICD-10-CM | POA: Diagnosis present

## 2013-08-23 DIAGNOSIS — Z9981 Dependence on supplemental oxygen: Secondary | ICD-10-CM

## 2013-08-23 DIAGNOSIS — I10 Essential (primary) hypertension: Secondary | ICD-10-CM

## 2013-08-23 DIAGNOSIS — E860 Dehydration: Secondary | ICD-10-CM | POA: Diagnosis present

## 2013-08-23 DIAGNOSIS — N179 Acute kidney failure, unspecified: Secondary | ICD-10-CM | POA: Diagnosis present

## 2013-08-23 DIAGNOSIS — C7B8 Other secondary neuroendocrine tumors: Secondary | ICD-10-CM | POA: Diagnosis present

## 2013-08-23 DIAGNOSIS — K659 Peritonitis, unspecified: Secondary | ICD-10-CM

## 2013-08-23 DIAGNOSIS — R509 Fever, unspecified: Secondary | ICD-10-CM

## 2013-08-23 DIAGNOSIS — C787 Secondary malignant neoplasm of liver and intrahepatic bile duct: Principal | ICD-10-CM | POA: Diagnosis present

## 2013-08-23 DIAGNOSIS — R1010 Upper abdominal pain, unspecified: Secondary | ICD-10-CM

## 2013-08-23 DIAGNOSIS — D649 Anemia, unspecified: Secondary | ICD-10-CM | POA: Diagnosis present

## 2013-08-23 DIAGNOSIS — E279 Disorder of adrenal gland, unspecified: Secondary | ICD-10-CM | POA: Diagnosis present

## 2013-08-23 DIAGNOSIS — I509 Heart failure, unspecified: Secondary | ICD-10-CM

## 2013-08-23 DIAGNOSIS — E669 Obesity, unspecified: Secondary | ICD-10-CM

## 2013-08-23 DIAGNOSIS — E43 Unspecified severe protein-calorie malnutrition: Secondary | ICD-10-CM

## 2013-08-23 DIAGNOSIS — D490 Neoplasm of unspecified behavior of digestive system: Secondary | ICD-10-CM

## 2013-08-23 DIAGNOSIS — J9819 Other pulmonary collapse: Secondary | ICD-10-CM | POA: Diagnosis present

## 2013-08-23 DIAGNOSIS — K769 Liver disease, unspecified: Secondary | ICD-10-CM

## 2013-08-23 DIAGNOSIS — Z91199 Patient's noncompliance with other medical treatment and regimen due to unspecified reason: Secondary | ICD-10-CM

## 2013-08-23 DIAGNOSIS — E1169 Type 2 diabetes mellitus with other specified complication: Secondary | ICD-10-CM | POA: Diagnosis present

## 2013-08-23 DIAGNOSIS — Z9119 Patient's noncompliance with other medical treatment and regimen: Secondary | ICD-10-CM

## 2013-08-23 DIAGNOSIS — Z794 Long term (current) use of insulin: Secondary | ICD-10-CM | POA: Diagnosis not present

## 2013-08-23 DIAGNOSIS — R634 Abnormal weight loss: Secondary | ICD-10-CM | POA: Diagnosis present

## 2013-08-23 DIAGNOSIS — D72829 Elevated white blood cell count, unspecified: Secondary | ICD-10-CM

## 2013-08-23 DIAGNOSIS — C7A098 Malignant carcinoid tumors of other sites: Secondary | ICD-10-CM | POA: Diagnosis present

## 2013-08-23 DIAGNOSIS — D3A098 Benign carcinoid tumors of other sites: Secondary | ICD-10-CM

## 2013-08-23 DIAGNOSIS — J4489 Other specified chronic obstructive pulmonary disease: Secondary | ICD-10-CM | POA: Diagnosis present

## 2013-08-23 LAB — COMPREHENSIVE METABOLIC PANEL
ALT: 17 U/L (ref 0–35)
AST: 50 U/L — AB (ref 0–37)
Albumin: 2.9 g/dL — ABNORMAL LOW (ref 3.5–5.2)
Alkaline Phosphatase: 264 U/L — ABNORMAL HIGH (ref 39–117)
Anion gap: 18 — ABNORMAL HIGH (ref 5–15)
BILIRUBIN TOTAL: 1.6 mg/dL — AB (ref 0.3–1.2)
BUN: 37 mg/dL — ABNORMAL HIGH (ref 6–23)
CHLORIDE: 94 meq/L — AB (ref 96–112)
CO2: 21 meq/L (ref 19–32)
CREATININE: 1.43 mg/dL — AB (ref 0.50–1.10)
Calcium: 9.5 mg/dL (ref 8.4–10.5)
GFR, EST AFRICAN AMERICAN: 44 mL/min — AB (ref >90)
GFR, EST NON AFRICAN AMERICAN: 38 mL/min — AB (ref >90)
Glucose, Bld: 208 mg/dL — ABNORMAL HIGH (ref 70–99)
Potassium: 3.7 mEq/L (ref 3.7–5.3)
Sodium: 133 mEq/L — ABNORMAL LOW (ref 137–147)
Total Protein: 8.6 g/dL — ABNORMAL HIGH (ref 6.0–8.3)

## 2013-08-23 LAB — CBC WITH DIFFERENTIAL/PLATELET
BASOS ABS: 0 10*3/uL (ref 0.0–0.1)
Basophils Relative: 0 % (ref 0–1)
EOS PCT: 0 % (ref 0–5)
Eosinophils Absolute: 0 10*3/uL (ref 0.0–0.7)
HEMATOCRIT: 35.5 % — AB (ref 36.0–46.0)
Hemoglobin: 11.7 g/dL — ABNORMAL LOW (ref 12.0–15.0)
LYMPHS ABS: 0.9 10*3/uL (ref 0.7–4.0)
Lymphocytes Relative: 4 % — ABNORMAL LOW (ref 12–46)
MCH: 27.4 pg (ref 26.0–34.0)
MCHC: 33 g/dL (ref 30.0–36.0)
MCV: 83.1 fL (ref 78.0–100.0)
MONO ABS: 2.3 10*3/uL — AB (ref 0.1–1.0)
Monocytes Relative: 11 % (ref 3–12)
NEUTROS ABS: 18.6 10*3/uL — AB (ref 1.7–7.7)
Neutrophils Relative %: 85 % — ABNORMAL HIGH (ref 43–77)
Platelets: 253 10*3/uL (ref 150–400)
RBC: 4.27 MIL/uL (ref 3.87–5.11)
RDW: 13.5 % (ref 11.5–15.5)
WBC: 21.9 10*3/uL — AB (ref 4.0–10.5)

## 2013-08-23 LAB — URINALYSIS, ROUTINE W REFLEX MICROSCOPIC
GLUCOSE, UA: NEGATIVE mg/dL
Hgb urine dipstick: NEGATIVE
KETONES UR: NEGATIVE mg/dL
Nitrite: NEGATIVE
PH: 5 (ref 5.0–8.0)
PROTEIN: 30 mg/dL — AB
Specific Gravity, Urine: 1.026 (ref 1.005–1.030)
Urobilinogen, UA: 1 mg/dL (ref 0.0–1.0)

## 2013-08-23 LAB — CREATININE, SERUM
Creatinine, Ser: 1.39 mg/dL — ABNORMAL HIGH (ref 0.50–1.10)
GFR calc Af Amer: 45 mL/min — ABNORMAL LOW (ref >90)
GFR calc non Af Amer: 39 mL/min — ABNORMAL LOW (ref >90)

## 2013-08-23 LAB — CBC
HEMATOCRIT: 35.3 % — AB (ref 36.0–46.0)
Hemoglobin: 11.2 g/dL — ABNORMAL LOW (ref 12.0–15.0)
MCH: 27 pg (ref 26.0–34.0)
MCHC: 31.7 g/dL (ref 30.0–36.0)
MCV: 85.1 fL (ref 78.0–100.0)
Platelets: 238 10*3/uL (ref 150–400)
RBC: 4.15 MIL/uL (ref 3.87–5.11)
RDW: 13.6 % (ref 11.5–15.5)
WBC: 20.2 10*3/uL — ABNORMAL HIGH (ref 4.0–10.5)

## 2013-08-23 LAB — URINE MICROSCOPIC-ADD ON

## 2013-08-23 LAB — LIPASE, BLOOD: LIPASE: 15 U/L (ref 11–59)

## 2013-08-23 LAB — HEMOGLOBIN A1C
Hgb A1c MFr Bld: 7.7 % — ABNORMAL HIGH (ref <5.7)
MEAN PLASMA GLUCOSE: 174 mg/dL — AB (ref <117)

## 2013-08-23 LAB — GLUCOSE, CAPILLARY: GLUCOSE-CAPILLARY: 165 mg/dL — AB (ref 70–99)

## 2013-08-23 MED ORDER — MORPHINE SULFATE 4 MG/ML IJ SOLN
4.0000 mg | Freq: Once | INTRAMUSCULAR | Status: AC
Start: 2013-08-23 — End: 2013-08-23
  Administered 2013-08-23: 4 mg via INTRAVENOUS
  Filled 2013-08-23: qty 1

## 2013-08-23 MED ORDER — GUAIFENESIN-DM 100-10 MG/5ML PO SYRP: 5.0000 mL | ORAL_SOLUTION | ORAL | Status: DC | PRN

## 2013-08-23 MED ORDER — PIPERACILLIN-TAZOBACTAM 3.375 G IVPB: 3.3750 g | Freq: Once | INTRAVENOUS | Status: DC

## 2013-08-23 MED ORDER — ATENOLOL 50 MG PO TABS
50.0000 mg | ORAL_TABLET | Freq: Every day | ORAL | Status: DC
  Administered 2013-08-23 – 2013-08-26 (×3): 50 mg via ORAL
  Filled 2013-08-23 (×4): qty 1

## 2013-08-23 MED ORDER — ACETAMINOPHEN 325 MG PO TABS
650.0000 mg | ORAL_TABLET | Freq: Four times a day (QID) | ORAL | Status: DC | PRN
  Administered 2013-08-24: 650 mg via ORAL
  Filled 2013-08-23: qty 2

## 2013-08-23 MED ORDER — POLYETHYLENE GLYCOL 3350 17 G PO PACK
17.0000 g | PACK | Freq: Every day | ORAL | Status: DC | PRN
  Filled 2013-08-23: qty 1

## 2013-08-23 MED ORDER — IOHEXOL 300 MG/ML  SOLN
50.0000 mL | Freq: Once | INTRAMUSCULAR | Status: AC | PRN
  Administered 2013-08-23: 50 mL via ORAL

## 2013-08-23 MED ORDER — ONDANSETRON HCL 4 MG/2ML IJ SOLN
4.0000 mg | Freq: Once | INTRAMUSCULAR | Status: AC
  Administered 2013-08-23: 4 mg via INTRAVENOUS
  Filled 2013-08-23: qty 2

## 2013-08-23 MED ORDER — SODIUM CHLORIDE 0.9 % IJ SOLN: 3.0000 mL | Freq: Two times a day (BID) | INTRAMUSCULAR | Status: DC

## 2013-08-23 MED ORDER — MORPHINE SULFATE 2 MG/ML IJ SOLN
2.0000 mg | INTRAMUSCULAR | Status: DC | PRN
  Administered 2013-08-24 (×2): 2 mg via INTRAVENOUS
  Filled 2013-08-23 (×2): qty 1

## 2013-08-23 MED ORDER — HEPARIN SODIUM (PORCINE) 5000 UNIT/ML IJ SOLN
5000.0000 [IU] | Freq: Three times a day (TID) | INTRAMUSCULAR | Status: DC
  Administered 2013-08-23: 5000 [IU] via SUBCUTANEOUS
  Filled 2013-08-23 (×5): qty 1

## 2013-08-23 MED ORDER — SODIUM CHLORIDE 0.9 % IV BOLUS (SEPSIS)
1000.0000 mL | Freq: Once | INTRAVENOUS | Status: AC
  Administered 2013-08-23: 1000 mL via INTRAVENOUS

## 2013-08-23 MED ORDER — ONDANSETRON HCL 4 MG/2ML IJ SOLN: 4.0000 mg | Freq: Four times a day (QID) | INTRAMUSCULAR | Status: DC | PRN

## 2013-08-23 MED ORDER — HYDROMORPHONE HCL PF 1 MG/ML IJ SOLN: 1.0000 mg | INTRAMUSCULAR | Status: DC | PRN

## 2013-08-23 MED ORDER — SODIUM CHLORIDE 0.9 % IV SOLN
1.0000 g | Freq: Three times a day (TID) | INTRAVENOUS | Status: DC
  Administered 2013-08-24 – 2013-08-25 (×5): 1 g via INTRAVENOUS
  Filled 2013-08-23 (×6): qty 1

## 2013-08-23 MED ORDER — ACETAMINOPHEN 650 MG RE SUPP: 650.0000 mg | Freq: Four times a day (QID) | RECTAL | Status: DC | PRN

## 2013-08-23 MED ORDER — KETOROLAC TROMETHAMINE 30 MG/ML IJ SOLN
30.0000 mg | Freq: Once | INTRAMUSCULAR | Status: AC
  Administered 2013-08-23: 30 mg via INTRAVENOUS
  Filled 2013-08-23: qty 1

## 2013-08-23 MED ORDER — SODIUM CHLORIDE 0.9 % IV SOLN
INTRAVENOUS | Status: DC
  Administered 2013-08-23 – 2013-08-24 (×3): via INTRAVENOUS

## 2013-08-23 MED ORDER — PIPERACILLIN-TAZOBACTAM 3.375 G IVPB 30 MIN
3.3750 g | Freq: Once | INTRAVENOUS | Status: DC
  Administered 2013-08-23: 3.375 g via INTRAVENOUS
  Filled 2013-08-23: qty 50

## 2013-08-23 MED ORDER — GABAPENTIN 400 MG PO CAPS
400.0000 mg | ORAL_CAPSULE | Freq: Three times a day (TID) | ORAL | Status: DC
  Administered 2013-08-23 – 2013-08-28 (×14): 400 mg via ORAL
  Filled 2013-08-23 (×17): qty 1

## 2013-08-23 MED ORDER — FLUTICASONE PROPIONATE HFA 44 MCG/ACT IN AERO
2.0000 | INHALATION_SPRAY | Freq: Two times a day (BID) | RESPIRATORY_TRACT | Status: DC
  Administered 2013-08-24 – 2013-08-28 (×9): 2 via RESPIRATORY_TRACT
  Filled 2013-08-23 (×2): qty 10.6

## 2013-08-23 MED ORDER — MEROPENEM 1 G IV SOLR
1.0000 g | INTRAVENOUS | Status: AC
  Administered 2013-08-23: 1 g via INTRAVENOUS
  Filled 2013-08-23: qty 1

## 2013-08-23 MED ORDER — INSULIN ASPART 100 UNIT/ML ~~LOC~~ SOLN
0.0000 [IU] | Freq: Four times a day (QID) | SUBCUTANEOUS | Status: DC
  Administered 2013-08-23: 4 [IU] via SUBCUTANEOUS
  Administered 2013-08-24: 7 [IU] via SUBCUTANEOUS
  Administered 2013-08-24 – 2013-08-25 (×3): 3 [IU] via SUBCUTANEOUS
  Administered 2013-08-25: 4 [IU] via SUBCUTANEOUS
  Administered 2013-08-25: 3 [IU] via SUBCUTANEOUS
  Administered 2013-08-26: 7 [IU] via SUBCUTANEOUS
  Administered 2013-08-26: 4 [IU] via SUBCUTANEOUS
  Administered 2013-08-27: 3 [IU] via SUBCUTANEOUS
  Administered 2013-08-27: 4 [IU] via SUBCUTANEOUS
  Administered 2013-08-27: 3 [IU] via SUBCUTANEOUS
  Administered 2013-08-27 – 2013-08-28 (×2): 4 [IU] via SUBCUTANEOUS
  Administered 2013-08-28 (×2): 3 [IU] via SUBCUTANEOUS

## 2013-08-23 MED ORDER — ONDANSETRON HCL 4 MG PO TABS
4.0000 mg | ORAL_TABLET | Freq: Four times a day (QID) | ORAL | Status: DC | PRN
  Filled 2013-08-23: qty 1

## 2013-08-23 MED ORDER — ONDANSETRON HCL 4 MG/2ML IJ SOLN: 4.0000 mg | Freq: Three times a day (TID) | INTRAMUSCULAR | Status: DC | PRN

## 2013-08-23 MED ORDER — HYDROCODONE-ACETAMINOPHEN 5-325 MG PO TABS
1.0000 | ORAL_TABLET | ORAL | Status: DC | PRN
  Administered 2013-08-25 (×2): 1 via ORAL
  Administered 2013-08-26 – 2013-08-28 (×5): 2 via ORAL
  Filled 2013-08-23: qty 2
  Filled 2013-08-23: qty 1
  Filled 2013-08-23 (×2): qty 2
  Filled 2013-08-23: qty 1
  Filled 2013-08-23 (×2): qty 2

## 2013-08-23 MED ORDER — IOHEXOL 300 MG/ML  SOLN
100.0000 mL | Freq: Once | INTRAMUSCULAR | Status: AC | PRN
  Administered 2013-08-23: 100 mL via INTRAVENOUS

## 2013-08-23 NOTE — ED Provider Notes (Signed)
CSN: [id number]     Arrival date & time 08/23/13  0915 History   First MD Initiated Contact with Patient 08/23/13 [phone]     Chief Complaint  Patient presents with  . Abdominal Pain     (Consider location/radiation/quality/duration/timing/severity/associated sxs/prior Treatment) HPI Comments: Patient is a 63 year old female with a past medical history of hypertension, diabetes, obesity, and CHF who presents with abdominal pain for 1 week. The pain is located in her upper abdomen and does not radiate. The pain is described as aching and severe. The pain started gradually and progressively worsened since the onset. No alleviating/aggravating factors. The patient has tried nothing for symptoms without relief. Associated symptoms include nausea and vomiting. Patient denies fever, headache, NVD, chest pain, SOB, dysuria, constipation, abnormal vaginal bleeding/discharge. Patient has a abdominal surgical history of total hysterectomy. She has a history of adrenal mass that was biopsied 5 months ago-patient is unsure of the results.      Past Medical History  Diagnosis Date  . Pneumonia   . Renal disorder     renal failure  . Obesity   . Hypertension   . Diabetes mellitus   . High cholesterol   . CHF (congestive heart failure)    Past Surgical History  Procedure Laterality Date  . Abdominal hysterectomy    . Knee reconstruction, medial patellar femoral ligament     No family history on file. History  Substance Use Topics  . Smoking status: Never Smoker   . Smokeless tobacco: Never Used  . Alcohol Use: No   OB History   Grav Para Term Preterm Abortions TAB SAB Ect Mult Living                 Review of Systems  Constitutional: Negative for fever, chills and fatigue.  HENT: Negative for trouble swallowing.   Eyes: Negative for visual disturbance.  Respiratory: Negative for shortness of breath.   Cardiovascular: Negative for chest pain and palpitations.  Gastrointestinal: Positive  for nausea, vomiting and abdominal pain. Negative for diarrhea.  Genitourinary: Negative for dysuria and difficulty urinating.  Musculoskeletal: Negative for arthralgias and neck pain.  Skin: Negative for color change.  Neurological: Negative for dizziness and weakness.  Psychiatric/Behavioral: Negative for dysphoric mood.      Allergies  Penicillins  Home Medications   Prior to Admission medications   Medication Sig Start Date End Date Taking? Authorizing Provider  acetaminophen (TYLENOL) 650 MG CR tablet Take 650 mg by mouth every 8 (eight) hours as needed for pain.    Historical Provider, MD  albuterol (PROVENTIL HFA;VENTOLIN HFA) 108 (90 BASE) MCG/ACT inhaler Inhale 2 puffs into the lungs every 6 (six) hours as needed for wheezing.     Historical Provider, MD  amLODipine-benazepril (LOTREL) 10-20 MG per capsule Take 1 capsule by mouth daily.    Historical Provider, MD  atenolol (TENORMIN) 50 MG tablet Take 50 mg by mouth daily.    Historical Provider, MD  beclomethasone (QVAR) 80 MCG/ACT inhaler Inhale 1 puff into the lungs as needed. breathing    Historical Provider, MD  Chromium-Cinnamon (CINNAMON PLUS CHROMIUM) [phone] MCG-MG CAPS Take 1 capsule by mouth daily.    Historical Provider, MD  Cranberry (HM CRANBERRY SUPER STRENGTH) 300 MG tablet Take 300 mg by mouth 2 (two) times daily.    Historical Provider, MD  feeding supplement, ENSURE COMPLETE, (ENSURE COMPLETE) LIQD Take 237 mLs by mouth 2 (two) times daily between meals. 04/01/13   Louellen Molder, MD  ferrous gluconate (FERGON) 324 MG tablet Take 1 tablet (324 mg total) by mouth 2 (two) times daily with a meal. 04/01/13   Thurnell Lose, MD  furosemide (LASIX) 40 MG tablet Take 1 tablet (40 mg total) by mouth daily. 10/29/11   Julieta Bellini, NP  gabapentin (NEURONTIN) 400 MG capsule Take 400 mg by mouth 3 (three) times daily.    Historical Provider, MD  Ginger, Zingiber officinalis, (GINGER ROOT) 550 MG CAPS Take 1 capsule by  mouth daily.    Historical Provider, MD  HYDROcodone-acetaminophen (NORCO/VICODIN) 5-325 MG per tablet Take 2 tablets by mouth every 6 (six) hours as needed for moderate pain. 04/01/13   Nishant Dhungel, MD  insulin aspart (NOVOLOG) 100 UNIT/ML injection Inject 40-50 Units into the skin 3 (three) times daily before meals. Inject 50 units at breakfast and 40 units at  lunch and 40 units at dinner. Pt on sliding scale    Historical Provider, MD  spironolactone (ALDACTONE) 25 MG tablet Take 25 mg by mouth daily.    Historical Provider, MD   BP 128/78  Pulse 100  Temp(Src) 101.9 F (38.8 C) (Oral)  Resp 20  Ht 5\' 10"  (1.778 m)  Wt 325 lb (147.419 kg)  BMI 46.63 kg/m2  SpO2 93% Physical Exam  Nursing note and vitals reviewed. Constitutional: She is oriented to person, place, and time. She appears well-developed and well-nourished. No distress.  HENT:  Head: Normocephalic and atraumatic.  Eyes: Conjunctivae and EOM are normal.  Neck: Normal range of motion.  Cardiovascular: Normal rate and regular rhythm.  Exam reveals no gallop and no friction rub.   No murmur heard. Pulmonary/Chest: Effort normal and breath sounds normal. She has no wheezes. She has no rales. She exhibits no tenderness.  Abdominal: Soft. She exhibits no distension. There is tenderness. There is no rebound and no guarding.  Epigastric tenderness to palpation. No other focal abdominal tenderness.   Musculoskeletal: Normal range of motion.  Neurological: She is alert and oriented to person, place, and time.  Speech is goal-oriented. Moves limbs without ataxia.   Skin: Skin is warm and dry.  Psychiatric: She has a normal mood and affect. Her behavior is normal.    ED Course  Procedures (including critical care time) Labs Review Labs Reviewed  CBC WITH DIFFERENTIAL - Abnormal; Notable for the following:    WBC 21.9 (*)    Hemoglobin 11.7 (*)    HCT 35.5 (*)    Neutrophils Relative % 85 (*)    Neutro Abs 18.6 (*)     Lymphocytes Relative 4 (*)    Monocytes Absolute 2.3 (*)    All other components within normal limits  COMPREHENSIVE METABOLIC PANEL - Abnormal; Notable for the following:    Sodium 133 (*)    Chloride 94 (*)    Glucose, Bld 208 (*)    BUN 37 (*)    Creatinine, Ser 1.43 (*)    Total Protein 8.6 (*)    Albumin 2.9 (*)    AST 50 (*)    Alkaline Phosphatase 264 (*)    Total Bilirubin 1.6 (*)    GFR calc non Af Amer 38 (*)    GFR calc Af Amer 44 (*)    Anion gap 18 (*)    All other components within normal limits  URINALYSIS, ROUTINE W REFLEX MICROSCOPIC - Abnormal; Notable for the following:    Color, Urine ORANGE (*)    APPearance CLOUDY (*)    Bilirubin Urine MODERATE (*)  Protein, ur 30 (*)    Leukocytes, UA TRACE (*)    All other components within normal limits  URINE MICROSCOPIC-ADD ON - Abnormal; Notable for the following:    Squamous Epithelial / LPF MANY (*)    Bacteria, UA MANY (*)    Casts HYALINE CASTS (*)    All other components within normal limits  CULTURE, BLOOD (ROUTINE X 2)  CULTURE, BLOOD (ROUTINE X 2)  LIPASE, BLOOD    Imaging Review Dg Chest 2 View  08/23/2013   CLINICAL DATA:  Cough and congestion.  Shortness of breath.  EXAM: CHEST  2 VIEW  COMPARISON:  03/29/2013.  10/29/2011.  FINDINGS: Mediastinum and hilar structures are normal. Mild cardiomegaly and pulmonary interstitial prominence. Mild component of congestive heart failure cannot be excluded. Mild pneumonitis cannot be excluded. No pleural effusion or pneumothorax. No acute osseus abnormality.  IMPRESSION: Cardiomegaly with mild interstitial prominence. Mild component of congestive heart failure may be present. Interstitial pneumonitis cannot be excluded. These findings are less prominent than on prior chest x-ray 03/29/2013.   Electronically Signed   By: Marcello Moores  Register   On: 08/23/2013 17:05   US Abdomen Complete  08/23/2013   CLINICAL DATA:  Abdominal pain for 1 week, vomiting, question  cholecystitis, history hypertension  EXAM: ULTRASOUND ABDOMEN COMPLETE  COMPARISON:  Ultrasound abdomen 03/31/2013, CT abdomen and pelvis 03/29/2013  FINDINGS: Gallbladder:  Normally distended without stones or wall thickening.  No pericholecystic fluid or sonographic Murphy sign.  Common bile duct:  Diameter: 4 mm diameter, normal  Liver:  Slightly heterogeneous echogenicity. No definite focal mass lesions. Hepatopetal portal venous flow.  IVC:  Normal appearance  Pancreas:  Tail incompletely visualized, visualized portions normal appearance  Spleen:  Normal appearance, 6.6 cm length  Right Kidney:  Length: 10.3 cm.  Normal morphology without mass or hydronephrosis.  Left Kidney:  Length: 11.3 cm.  Normal morphology without mass or hydronephrosis.  Abdominal aorta:  Bifurcation obscured by bowel gas. Visualized portion normal caliber.  Other findings:  No free-fluid  IMPRESSION: Incomplete visualization of distal aorta and pancreatic tail.  Mildly inhomogeneous hepatic echogenicity without discrete mass.  No evidence of cholecystitis or cholelithiasis.   Electronically Signed   By: Lavonia Dana M.D.   On: 08/23/2013 12:26   Ct Abdomen Pelvis W Contrast  08/23/2013   CLINICAL DATA:  Mid abdominal pain.  Vomiting.  EXAM: CT ABDOMEN AND PELVIS WITH CONTRAST  TECHNIQUE: Multidetector CT imaging of the abdomen and pelvis was performed using the standard protocol following bolus administration of intravenous contrast.  CONTRAST:  31mL OMNIPAQUE IOHEXOL 300 MG/ML SOLN, 163mL OMNIPAQUE IOHEXOL 300 MG/ML SOLN  COMPARISON:  Multiple exams, including 08/23/2013 and 03/29/2013  FINDINGS: Volume loss and hazy opacities in both lower lobes favoring atelectasis over aspiration pneumonitis. Cardiomegaly observed favoring the ventricles.  Abnormal heterogeneous lesions throughout the liver favoring diffuse hepatic metastatic disease. Index peripheral right hepatic lobe lesion 4.9 x 3.7 cm, difficult to measure on the prior exam  due the lack of IV contrast but probably the same.  Spleen grossly unremarkable. Left adrenal mass 5.4 by 4.9 cm, previously 4.9 x 4.6 cm.  Mildly progressive porta hepatis sudden adenopathy. An aortocaval node measures 1.5 cm in short axis, previously 1.4 cm.  Mesenteric mass is again noted with some calcification. At the level with the calcification, the mass measures 4.6 x 3.0 cm. When I measure this mass in the same fashion on the prior exam, I get 4.7 x 3.0 cm.  Kidneys and proximal ureters unremarkable. Aside from a mesenteric mass, no pelvic adenopathy is observed.  There is pelvic floor laxity with the urinary bladder extending below the pubococcygeal line and the anorectal junction well below the pubococcygeal line.  IMPRESSION: 1. Partially calcified mesenteric mass with innumerable metastatic lesions in the liver. I am unable to find the pathology report from the prior adrenal biopsy in the [facility] system. 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 Conceivably this could be metastatic lung disease, lymphoma, or other gastrointestinal primary cancer. Correlate with biopsy results. 2. Atelectasis in both lung bases. 3. Cardiomegaly. 4. Pelvic floor laxity.   Electronically Signed   By: Sherryl Barters M.D.   On: 08/23/2013 14:32     EKG Interpretation None      MDM   Final diagnoses:  Carcinoid tumor of abdomen  Pain of upper abdomen  Other specified fever    9:31 AM Labs and urinalysis pending. Patient is febrile at 101.30F and hear trate at 100. Patient will have morphine, toradol, zofran and fluids.   [place] ordered due to elevated liver enzymes and elevated WBC. Will rule out cholecystitis.   3:51 PM US unremarkable. CT abdomen pelvis shows carcinoid tumor of abdomen with mets to the liver. Blood cultures ordered. Zosyn ordered. General surgery will see the  patient. Dr. Merita Norton will admit.     Alvina Chou, PA-C 08/23/13 [place], PA-C 08/23/13 [place], [place] 08/24/13 [phone]

## 2013-08-23 NOTE — H&P (Signed)
Patient Demographics  Yesenia Lyons, is a 63 y.o. female  MRN: [id number]   DOB - 12-Aug-1950  Admit Date - 08/23/2013  Outpatient Primary MD for the patient is Secundino Ginger, PA-C   With History of -  Past Medical History  Diagnosis Date  . Pneumonia   . Renal disorder     renal failure  . Obesity   . Hypertension   . Diabetes mellitus   . High cholesterol   . CHF (congestive heart failure)       Past Surgical History  Procedure Laterality Date  . Abdominal hysterectomy    . Knee reconstruction, medial patellar femoral ligament      in for   Chief Complaint  Patient presents with  . Abdominal Pain     HPI  Yesenia Lyons  is a 63 y.o. female, the epididymitis type II non-insulin-dependent, chronic CHF unknown type no echogram in the chart, morbid obesity, hypertension, unintentional weight loss for the last 3-4 months, abdominal pain for the last 3-4 months, patient was admitted her 4 months ago for abdominal pain at that time her workup was consistent with right lower quadrant mass on CT scan with liver and adrenal lesions, patient underwent CT-guided leftward renal biopsy on 03/31/2013 by IR however patient never received the biopsy report and I am unable to locate the biopsy report in the system as well. She was discharged home and initially felt better but for the last week her abdominal pain has come back it is generally diffuse over the lower quadrants, or abdominal pain is dull, constant, no aggravating relieving factors it is associated with diarrhea, weight loss and low-grade fevers and chills.  In the ER today CT scan again consistent with large mesenteric mass with multiple lesions in the liver, ARF, leukocytosis, low-grade fever, surgery was consulted and I was called to admit the  patient.  Patient denies any headache, no chest pain palpitations or cough, no shortness of breath, no blood or mucus in stool or urine, no focal weakness.    Review of Systems    In addition to the HPI above,  No Fever-chills, No Headache, No changes with Vision or hearing, No problems swallowing food or Liquids, No Chest pain, Cough or Shortness of Breath, Diffuse lower quadrant abdominal pain associated with nausea and diarrhea  No Blood in stool or Urine, No dysuria, No new skin rashes or bruises, No new joints pains-aches,  No new weakness, tingling, numbness in any extremity, No recent weight gain , does have unintentional weight loss No polyuria, polydypsia or polyphagia, No significant Mental Stressors.  A full 10 point Review of Systems was done, except as stated above, all other Review of Systems were negative.   Social History History  Substance Use Topics  . Smoking status: Never Smoker   . Smokeless tobacco: Never Used  . Alcohol Use: No      Family History Breast cancer in sister  Prior  to Admission medications   Medication Sig Start Date End Date Taking? Authorizing Provider  acetaminophen (TYLENOL) 650 MG CR tablet Take 650 mg by mouth every 8 (eight) hours as needed for pain.   Yes Historical Provider, MD  albuterol (PROVENTIL HFA;VENTOLIN HFA) 108 (90 BASE) MCG/ACT inhaler Inhale 2 puffs into the lungs every 6 (six) hours as needed for wheezing.    Yes Historical Provider, MD  amLODipine-benazepril (LOTREL) 10-20 MG per capsule Take 1 capsule by mouth daily.   Yes Historical Provider, MD  atenolol (TENORMIN) 50 MG tablet Take 50 mg by mouth daily.   Yes Historical Provider, MD  beclomethasone (QVAR) 80 MCG/ACT inhaler Inhale 1 puff into the lungs as needed (sob). breathing   Yes Historical Provider, MD  Chromium-Cinnamon (CINNAMON PLUS CHROMIUM) [phone] MCG-MG CAPS Take 1 capsule by mouth daily.   Yes Historical Provider, MD  Cranberry (HM CRANBERRY  SUPER STRENGTH) 300 MG tablet Take 300 mg by mouth 2 (two) times daily.   Yes Historical Provider, MD  feeding supplement, ENSURE COMPLETE, (ENSURE COMPLETE) LIQD Take 237 mLs by mouth 2 (two) times daily between meals. 04/01/13  Yes Nishant Dhungel, MD  ferrous gluconate (FERGON) 324 MG tablet Take 1 tablet (324 mg total) by mouth 2 (two) times daily with a meal. 04/01/13  Yes Thurnell Lose, MD  furosemide (LASIX) 40 MG tablet Take 1 tablet (40 mg total) by mouth daily. 10/29/11  Yes Julieta Bellini, NP  gabapentin (NEURONTIN) 400 MG capsule Take 400 mg by mouth 3 (three) times daily.   Yes Historical Provider, MD  Ginger, Zingiber officinalis, (GINGER ROOT) 550 MG CAPS Take 1 capsule by mouth daily.   Yes Historical Provider, MD  HYDROcodone-acetaminophen (NORCO/VICODIN) 5-325 MG per tablet Take 2 tablets by mouth every 6 (six) hours as needed for moderate pain. 04/01/13  Yes Nishant Dhungel, MD  insulin aspart (NOVOLOG) 100 UNIT/ML injection Inject 40-50 Units into the skin 3 (three) times daily before meals. Inject 50 units at breakfast and 40 units at  lunch and 40 units at dinner. Pt on sliding scale   Yes Historical Provider, MD  spironolactone (ALDACTONE) 25 MG tablet Take 25 mg by mouth daily.   Yes Historical Provider, MD    Allergies  Allergen Reactions  . Penicillins Itching and Swelling    Physical Exam  Vitals  Blood pressure 145/78, pulse 81, temperature 101.9 F (38.8 C), temperature source Oral, resp. rate 17, height 5\' 10"  (1.778 m), weight 147.419 kg (325 lb), SpO2 93.00%.   1. General elderly obese African American female lying in bed in NAD,    2. Normal affect and insight, Not Suicidal or Homicidal, Awake Alert, Oriented X 3.  3. No F.N deficits, ALL C.Nerves Intact, Strength 5/5 all 4 extremities, Sensation intact all 4 extremities, Plantars down going.  4. Ears and Eyes appear Normal, Conjunctivae clear, PERRLA. Moist Oral Mucosa.  5. Supple Neck, No JVD, No  cervical lymphadenopathy appriciated, No Carotid Bruits.  6. Symmetrical Chest wall movement, Good air movement bilaterally, CTAB.  7. RRR, No Gallops, Rubs or Murmurs, No Parasternal Heave.  8. Positive Bowel Sounds, Abdomen Soft, minimal lower quadrant tenderness, No organomegaly appriciated,No rebound -guarding or rigidity. Old suprapubic midline hysterectomy scar stable  9.  No Cyanosis, Normal Skin Turgor, No Skin Rash or Bruise.  10. Good muscle tone,  joints appear normal , no effusions, Normal ROM.  11. No Palpable Lymph Nodes in Neck or Axillae     Data Review  CBC  Recent Labs Lab 08/23/13 1016  WBC 21.9*  HGB 11.7*  HCT 35.5*  PLT 253  MCV 83.1  MCH 27.4  MCHC 33.0  RDW 13.5  LYMPHSABS 0.9  MONOABS 2.3*  EOSABS 0.0  BASOSABS 0.0   ------------------------------------------------------------------------------------------------------------------  Chemistries   Recent Labs Lab 08/23/13 1016  NA 133*  K 3.7  CL 94*  CO2 21  GLUCOSE 208*  BUN 37*  CREATININE 1.43*  CALCIUM 9.5  AST 50*  ALT 17  ALKPHOS 264*  BILITOT 1.6*   ------------------------------------------------------------------------------------------------------------------ estimated creatinine clearance is 62.8 ml/min (by C-G formula based on Cr of 1.43). ------------------------------------------------------------------------------------------------------------------ No results found for this basename: TSH, T4TOTAL, FREET3, T3FREE, THYROIDAB,  in the last 72 hours   Coagulation profile No results found for this basename: INR, PROTIME,  in the last 168 hours ------------------------------------------------------------------------------------------------------------------- No results found for this basename: DDIMER,  in the last 72 hours -------------------------------------------------------------------------------------------------------------------  Cardiac Enzymes No results  found for this basename: CK, CKMB, TROPONINI, MYOGLOBIN,  in the last 168 hours ------------------------------------------------------------------------------------------------------------------ No components found with this basename: POCBNP,    ---------------------------------------------------------------------------------------------------------------  Urinalysis    Component Value Date/Time   COLORURINE ORANGE* 08/23/2013 1244   APPEARANCEUR CLOUDY* 08/23/2013 1244   LABSPEC 1.026 08/23/2013 1244   PHURINE 5.0 08/23/2013 [place] 08/23/2013 [place] 08/23/2013 [place]* 08/23/2013 [place] 08/23/2013 1244   PROTEINUR 30* 08/23/2013 1244   UROBILINOGEN 1.0 08/23/2013 1244   NITRITE NEGATIVE 08/23/2013 1244   LEUKOCYTESUR TRACE* 08/23/2013 1244    ----------------------------------------------------------------------------------------------------------------  Imaging results:   US Abdomen Complete  08/23/2013   CLINICAL DATA:  Abdominal pain for 1 week, vomiting, question cholecystitis, history hypertension  EXAM: ULTRASOUND ABDOMEN COMPLETE  COMPARISON:  Ultrasound abdomen 03/31/2013, CT abdomen and pelvis 03/29/2013  FINDINGS: Gallbladder:  Normally distended without stones or wall thickening.  No pericholecystic fluid or sonographic Murphy sign.  Common bile duct:  Diameter: 4 mm diameter, normal  Liver:  Slightly heterogeneous echogenicity. No definite focal mass lesions. Hepatopetal portal venous flow.  IVC:  Normal appearance  Pancreas:  Tail incompletely visualized, visualized portions normal appearance  Spleen:  Normal appearance, 6.6 cm length  Right Kidney:  Length: 10.3 cm.  Normal morphology without mass or hydronephrosis.  Left Kidney:  Length: 11.3 cm.  Normal morphology without mass or hydronephrosis.  Abdominal aorta:  Bifurcation obscured by bowel gas. Visualized portion normal caliber.  Other findings:  No  free-fluid  IMPRESSION: Incomplete visualization of distal aorta and pancreatic tail.  Mildly inhomogeneous hepatic echogenicity without discrete mass.  No evidence of cholecystitis or cholelithiasis.   Electronically Signed   By: Lavonia Dana M.D.   On: 08/23/2013 12:26   Ct Abdomen Pelvis W Contrast  08/23/2013   CLINICAL DATA:  Mid abdominal pain.  Vomiting.  EXAM: CT ABDOMEN AND PELVIS WITH CONTRAST  TECHNIQUE: Multidetector CT imaging of the abdomen and pelvis was performed using the standard protocol following bolus administration of intravenous contrast.  CONTRAST:  40mL OMNIPAQUE IOHEXOL 300 MG/ML SOLN, 185mL OMNIPAQUE IOHEXOL 300 MG/ML SOLN  COMPARISON:  Multiple exams, including 08/23/2013 and 03/29/2013  FINDINGS: Volume loss and hazy opacities in both lower lobes favoring atelectasis over aspiration pneumonitis. Cardiomegaly observed favoring the ventricles.  Abnormal heterogeneous lesions throughout the liver favoring diffuse hepatic metastatic disease. Index peripheral right hepatic lobe lesion 4.9 x 3.7 cm, difficult to measure on the prior exam due the lack of IV  contrast but probably the same.  Spleen grossly unremarkable. Left adrenal mass 5.4 by 4.9 cm, previously 4.9 x 4.6 cm.  Mildly progressive porta hepatis sudden adenopathy. An aortocaval node measures 1.5 cm in short axis, previously 1.4 cm.  Mesenteric mass is again noted with some calcification. At the level with the calcification, the mass measures 4.6 x 3.0 cm. When I measure this mass in the same fashion on the prior exam, I get 4.7 x 3.0 cm.  Kidneys and proximal ureters unremarkable. Aside from a mesenteric mass, no pelvic adenopathy is observed.  There is pelvic floor laxity with the urinary bladder extending below the pubococcygeal line and the anorectal junction well below the pubococcygeal line.  IMPRESSION: 1. Partially calcified mesenteric mass with innumerable metastatic lesions in the liver. I am unable to find the pathology  report from the prior adrenal biopsy in the [facility] system. 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 Conceivably this could be metastatic lung disease, lymphoma, or other gastrointestinal primary cancer. Correlate with biopsy results. 2. Atelectasis in both lung bases. 3. Cardiomegaly. 4. Pelvic floor laxity.   Electronically Signed   By: Sherryl Barters M.D.   On: 08/23/2013 14:32    Line EKG and echo gram ordered.    Assessment & Plan   1. 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 Based on the biopsy results and clinical course she might require another surgery or evaluation by oncology.   2. DM type II. Takes 25 of NovoLog 3 times a day, for now she is n.p.o. we'll place her on every 6 hour moderate sliding scale check A1c and monitor CBGs.   3. ARF. Likely due to dehydration from diarrhea. Hold diuretics, avoid ACE/ARB and NSAIDs. IV fluids repeat BMP in the morning.   4. Chronically elevated alkaline phosphatase. Number ultrasound nonacute. Outpatient monitor.   5. Hypertension. We'll place on as needed IV hydralazine.   6. History of CHF. Obtain baseline EKG and echogram. For now holding diuretics monitor fluid status closely.   7. History of COPD. Continue nebulizer treatments as needed on a when necessary basis. Compensated no wheezing on exam.     DVT Prophylaxis Heparin    AM Labs Ordered, also please review Full Orders  Family Communication: Admission, patients condition and plan of care including tests being ordered have been discussed  with the patient and daughter who indicate understanding and agree with the plan and Code Status.  Code Status full  Likely DC to  Home  Condition GUARDED     Time spent in minutes : 35    Tupac Jeffus K M.D on 08/23/2013 at 4:15 PM  Between 7am to 7pm - Pager - [phone]  After 7pm go to [email or web address] - password [email or web address]  And look for the night coverage person covering me after hours  [facility] Office  [phone]   **Disclaimer: This note may have been dictated with voice recognition software. Similar sounding words can inadvertently be transcribed and this note may contain transcription errors which may not have been corrected upon publication of note.**

## 2013-08-23 NOTE — ED Notes (Addendum)
Unable to collect second set of cultures. Lab asked to come attempt.

## 2013-08-23 NOTE — ED Notes (Signed)
Took patient to the restroom, was unable to give urine sample. Will try again later

## 2013-08-23 NOTE — ED Notes (Signed)
Will get urine once IV is put in

## 2013-08-23 NOTE — ED Notes (Signed)
Pt c/o abd pain x 1 week, Pt states she vomited a couple of times. Denies diarrhea.

## 2013-08-23 NOTE — ED Notes (Signed)
Unsuccessfully attempted to obtain blood for labs.RN made aware 

## 2013-08-23 NOTE — Progress Notes (Signed)
UR completed 

## 2013-08-23 NOTE — ED Notes (Signed)
Came to get urine sample was unable

## 2013-08-23 NOTE — Progress Notes (Signed)
ANTIBIOTIC CONSULT NOTE - INITIAL  Pharmacy Consult for Meropenem Indication: Intra-abdominal infection  Allergies  Allergen Reactions  . Penicillins Itching and Swelling    Patient Measurements: Height: 5\' 10"  (177.8 cm) Weight: 325 lb (147.419 kg) IBW/kg (Calculated) : 68.5 Adjusted Body Weight:   Vital Signs: Temp: 101.9 F (38.8 C) (08/17 0925) Temp src: Oral (08/17 0925) BP: 145/78 mmHg (08/17 1422) Pulse Rate: Yesenia (08/17 1422) Intake/Output from previous day:   Intake/Output from this shift:    Labs:  Recent Labs  08/23/13 1016  WBC 21.9*  HGB 11.7*  PLT 253  CREATININE 1.43*   Estimated Creatinine Clearance: 62.8 ml/min (by C-G formula based on Cr of 1.43). No results found for this basename: VANCOTROUGH, VANCOPEAK, VANCORANDOM, GENTTROUGH, GENTPEAK, GENTRANDOM, TOBRATROUGH, TOBRAPEAK, TOBRARND, AMIKACINPEAK, AMIKACINTROU, AMIKACIN,  in the last 72 hours   Microbiology: No results found for this or any previous visit (from the past 720 hour(s)).  Medical History: Past Medical History  Diagnosis Date  . Pneumonia   . Renal disorder     renal failure  . Obesity   . Hypertension   . Diabetes mellitus   . High cholesterol   . CHF (congestive heart failure)    Assessment: Yesenia Lyons with PMHx HTN, DM, obesity, and CHF presents with abdominal pain for 1 week, fever, and tachycardia. U/S unremarkable but CT abdomen shows carcinoid tumor with mets to liver. Pharmacy intially consulted to dose zoysn for possible intra-abdominal infection however due to  allergy to penicillins (itching/swelling), MD changing to meropenem.   8/17 >> meropenem >>  Tmax: 101.9 WBCs: Elevated 21.9K Renal: SCr 1.43, CrCl 63 CG (N45)  8/17 blood: ordered  Goal of Therapy:  Eradication of infection  Plan:  Meropenem 1g q8h F/u renal function, clinical course  Ralene Bathe, PharmD, BCPS 08/23/2013, 4:25 PM  Pager: [phone]

## 2013-08-23 NOTE — Consult Note (Signed)
[facility] Surgery Consult Note  Yesenia Lyons 10-30-1950  [id number].    Requesting MD: Dr. Candiss Norse Chief Complaint/Reason for Consult:  Mesenteric mass with liver involvement  HPI:  63 y.o. female, the DM Type 2 on insulin, chronic CHF (cards workup in last 94mo- Dr. DLennice Sites, morbid obesity, [facility] dull abdominal pain over the last 1 week.  The is located over the central abdomen.  She's also experienced unintentional weight loss of 20-30lbs, nausea, anorexia, diarrhea, fatigue, weakness, low grade fevers and chills.  She is able to keep down some liquids, but no real solid food for several days.  Also ROS positive for chronic cough and SOB.  Patient denies any dysuria, headache, no chest pain palpitations, no blood or mucus in stool or urine, no focal weakness.  [facility] at that time her workup was consistent with right lower quadrant mass on CT scan with liver and adrenal lesions, patient underwent CT-guided leftward renal biopsy on 03/31/2013 by IR and result showed rare atypical cell ans abundant blood and fibrin.  She was discharged home and felt fine for the last 5 months, but for the last week her abdominal pain worsened again and is now diffuse over the central abdomen, no aggravating or alleviating factors.  No radicular symptoms.  In the ER today CT scan again consistent with large (5.4cm x 3.0cm) mesenteric mass with multiple lesions in the liver (4.9cm x 4.6cm), ARF, leukocytosis, low-grade fever.   ROS: All systems reviewed and otherwise negative except for as above  History reviewed. No pertinent family history.  Past Medical History  Diagnosis Date  . Pneumonia   . Renal disorder     renal failure  . Obesity   . Hypertension   . Diabetes mellitus   . High cholesterol   . CHF (congestive heart failure)     Past Surgical History  Procedure Laterality Date  .  Abdominal hysterectomy    . Knee reconstruction, medial patellar femoral ligament      Social History:  reports that she has never smoked. She has never used smokeless tobacco. She reports that she does not drink alcohol or use illicit drugs.  Allergies:  Allergies  Allergen Reactions  . Penicillins Itching and Swelling     (Not in a hospital admission)  Blood pressure 145/78, pulse 81, temperature 101.9 F (38.8 C), temperature source Oral, resp. rate 17, height '5\' 10"'  (1.778 m), weight 325 lb (147.419 kg), SpO2 93.00%. Physical Exam: General: Obese, pleasant, WD/WN AA female who is laying in bed in NAD HEENT: head is normocephalic, atraumatic.  Sclera are noninjected.  PERRL.  Ears and nose without any masses or lesions.  Mouth is pink and moist Heart: regular, rate, and rhythm.  No obvious murmurs, gallops, or rubs noted.  Palpable pedal pulses bilaterally Lungs: CTAB, no wheezes, rhonchi, or rales noted.  Respiratory effort nonlabored Abd: Obese, soft, ND, mild tenderness diffusely >central abdomen, +BS, no masses, hernias, or organomegaly, low vertical scar noted in the lower abdomen (hysterectomy scar) MS: all 4 extremities are symmetrical with no cyanosis, clubbing, +1 b/l LE edema Skin: warm and dry with no masses, lesions, or rashes Psych: A&Ox3 with an appropriate affect.   Results for orders placed during the hospital encounter of 08/23/13 (from the past 48 hour(s))  CBC WITH DIFFERENTIAL     Status: Abnormal   Collection Time    08/23/13  10:16 AM      Result Value Ref Range   WBC 21.9 (*) 4.0 - 10.5 K/uL   RBC 4.27  3.87 - 5.11 MIL/uL   Hemoglobin 11.7 (*) 12.0 - 15.0 g/dL   HCT 35.5 (*) 36.0 - 46.0 %   MCV 83.1  78.0 - 100.0 fL   MCH 27.4  26.0 - 34.0 pg   MCHC 33.0  30.0 - 36.0 g/dL   RDW 13.5  11.5 - 15.5 %   Platelets 253  150 - 400 K/uL   Neutrophils Relative % 85 (*) 43 - 77 %   Neutro Abs 18.6 (*) 1.7 - 7.7 K/uL   Lymphocytes Relative 4 (*) 12 - 46 %    Lymphs Abs 0.9  0.7 - 4.0 K/uL   Monocytes Relative 11  3 - 12 %   Monocytes Absolute 2.3 (*) 0.1 - 1.0 K/uL   Eosinophils Relative 0  0 - 5 %   Eosinophils Absolute 0.0  0.0 - 0.7 K/uL   Basophils Relative 0  0 - 1 %   Basophils Absolute 0.0  0.0 - 0.1 K/uL  COMPREHENSIVE METABOLIC PANEL     Status: Abnormal   Collection Time    08/23/13 10:16 AM      Result Value Ref Range   Sodium 133 (*) 137 - 147 mEq/L   Potassium 3.7  3.7 - 5.3 mEq/L   Chloride 94 (*) 96 - 112 mEq/L   CO2 21  19 - 32 mEq/L   Glucose, Bld 208 (*) 70 - 99 mg/dL   BUN 37 (*) 6 - 23 mg/dL   Creatinine, Ser 1.43 (*) 0.50 - 1.10 mg/dL   Calcium 9.5  8.4 - 10.5 mg/dL   Total Protein 8.6 (*) 6.0 - 8.3 g/dL   Albumin 2.9 (*) 3.5 - 5.2 g/dL   AST 50 (*) 0 - 37 U/L   ALT 17  0 - 35 U/L   Alkaline Phosphatase 264 (*) 39 - 117 U/L   Total Bilirubin 1.6 (*) 0.3 - 1.2 mg/dL   GFR calc non Af Amer 38 (*) >90 mL/min   GFR calc Af Amer 44 (*) >90 mL/min   Comment: (NOTE)     The eGFR has been calculated using the CKD EPI equation.     This calculation has not been validated in all clinical situations.     eGFR's persistently <90 mL/min signify possible Chronic Kidney     Disease.   Anion gap 18 (*) 5 - 15  LIPASE, BLOOD     Status: None   Collection Time    08/23/13 10:16 AM      Result Value Ref Range   Lipase 15  11 - 59 U/L  URINALYSIS, ROUTINE W REFLEX MICROSCOPIC     Status: Abnormal   Collection Time    08/23/13 12:44 PM      Result Value Ref Range   Color, Urine ORANGE (*) YELLOW   Comment: BIOCHEMICALS MAY BE AFFECTED BY COLOR   APPearance CLOUDY (*) CLEAR   Specific Gravity, Urine 1.026  1.005 - 1.030   pH 5.0  5.0 - 8.0   Glucose, UA NEGATIVE  NEGATIVE mg/dL   Hgb urine dipstick NEGATIVE  NEGATIVE   Bilirubin Urine MODERATE (*) NEGATIVE   Ketones, ur NEGATIVE  NEGATIVE mg/dL   Protein, ur 30 (*) NEGATIVE mg/dL   Urobilinogen, UA 1.0  0.0 - 1.0 mg/dL   Nitrite NEGATIVE  NEGATIVE   Leukocytes, UA  TRACE (*) NEGATIVE  URINE MICROSCOPIC-ADD ON     Status: Abnormal   Collection Time    08/23/13 12:44 PM      Result Value Ref Range   Squamous Epithelial / LPF MANY (*) RARE   WBC, UA 3-6  <3 WBC/hpf   RBC / HPF 0-2  <3 RBC/hpf   Bacteria, UA MANY (*) RARE   Casts HYALINE CASTS (*) NEGATIVE   Urine-Other MUCOUS PRESENT     US Abdomen Complete  08/23/2013   CLINICAL DATA:  Abdominal pain for 1 week, vomiting, question cholecystitis, history hypertension  EXAM: ULTRASOUND ABDOMEN COMPLETE  COMPARISON:  Ultrasound abdomen 03/31/2013, CT abdomen and pelvis 03/29/2013  FINDINGS: Gallbladder:  Normally distended without stones or wall thickening.  No pericholecystic fluid or sonographic Murphy sign.  Common bile duct:  Diameter: 4 mm diameter, normal  Liver:  Slightly heterogeneous echogenicity. No definite focal mass lesions. Hepatopetal portal venous flow.  IVC:  Normal appearance  Pancreas:  Tail incompletely visualized, visualized portions normal appearance  Spleen:  Normal appearance, 6.6 cm length  Right Kidney:  Length: 10.3 cm.  Normal morphology without mass or hydronephrosis.  Left Kidney:  Length: 11.3 cm.  Normal morphology without mass or hydronephrosis.  Abdominal aorta:  Bifurcation obscured by bowel gas. Visualized portion normal caliber.  Other findings:  No free-fluid  IMPRESSION: Incomplete visualization of distal aorta and pancreatic tail.  Mildly inhomogeneous hepatic echogenicity without discrete mass.  No evidence of cholecystitis or cholelithiasis.   Electronically Signed   By: Lavonia Dana M.D.   On: 08/23/2013 12:26   Ct Abdomen Pelvis W Contrast  08/23/2013   CLINICAL DATA:  Mid abdominal pain.  Vomiting.  EXAM: CT ABDOMEN AND PELVIS WITH CONTRAST  TECHNIQUE: Multidetector CT imaging of the abdomen and pelvis was performed using the standard protocol following bolus administration of intravenous contrast.  CONTRAST:  4m OMNIPAQUE IOHEXOL 300 MG/ML SOLN, 1056mOMNIPAQUE IOHEXOL  300 MG/ML SOLN  COMPARISON:  Multiple exams, including 08/23/2013 and 03/29/2013  FINDINGS: Volume loss and hazy opacities in both lower lobes favoring atelectasis over aspiration pneumonitis. Cardiomegaly observed favoring the ventricles.  Abnormal heterogeneous lesions throughout the liver favoring diffuse hepatic metastatic disease. Index peripheral right hepatic lobe lesion 4.9 x 3.7 cm, difficult to measure on the prior exam due the lack of IV contrast but probably the same.  Spleen grossly unremarkable. Left adrenal mass 5.4 by 4.9 cm, previously 4.9 x 4.6 cm.  Mildly progressive porta hepatis sudden adenopathy. An aortocaval node measures 1.5 cm in short axis, previously 1.4 cm.  Mesenteric mass is again noted with some calcification. At the level with the calcification, the mass measures 4.6 x 3.0 cm. When I measure this mass in the same fashion on the prior exam, I get 4.7 x 3.0 cm.  Kidneys and proximal ureters unremarkable. Aside from a mesenteric mass, no pelvic adenopathy is observed.  There is pelvic floor laxity with the urinary bladder extending below the pubococcygeal line and the anorectal junction well below the pubococcygeal line.  IMPRESSION: 1. Partially calcified mesenteric mass with innumerable metastatic lesions in the liver. I am unable to find the pathology report from the prior adrenal biopsy in the EPIC system. 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 Conceivably this could be metastatic lung disease, lymphoma, or other gastrointestinal primary cancer. Correlate with biopsy results. 2. Atelectasis in both lung  bases. 3. Cardiomegaly. 4. Pelvic floor laxity.   Electronically Signed   By: Sherryl Barters M.D.   On: 08/23/2013 14:32      Assessment/Plan Calcified mesenteric mass with liver involvement ? Lymphoma vs carcinoid Left adrenal gland mass  s/p biopsy - Rare atypical cells and abundant blood and fibrin Elevated alk phos - 264 Elevated bilirubin - 1.6 Leukocytosis - 21.9 Cough/SOB CHF - seen by Dr. Lennice Sites at [facility] DM on Insulin Obesity - Body mass index is 46.63 kg/(m^2). HLD Renal insuff - Cr 1.43   Plan: 1.  Admit to medicine, we will consult 2.  NPO, bowel rest, IVF, pain control, antiemetics 3.  Ambulate and IS 4.  Adrenal biopsy showed rare atypical cells back in March.  Will need image guided biopsy of liver lesions, if malignant will likely Oncology consult 5.  No need for acute surgery at this time 6.  Discussed with the daughter at bedside who is her [place], [place], [facility] 08/23/2013, 4:16 PM Pager: [phone]

## 2013-08-23 NOTE — ED Notes (Signed)
Patient tried to give urine sample again and was to weak

## 2013-08-24 ENCOUNTER — Inpatient Hospital Stay (HOSPITAL_COMMUNITY): Payer: Medicare (Managed Care)

## 2013-08-24 ENCOUNTER — Encounter (HOSPITAL_COMMUNITY): Payer: Self-pay | Admitting: Radiology

## 2013-08-24 DIAGNOSIS — I517 Cardiomegaly: Secondary | ICD-10-CM

## 2013-08-24 DIAGNOSIS — I509 Heart failure, unspecified: Secondary | ICD-10-CM

## 2013-08-24 DIAGNOSIS — R109 Unspecified abdominal pain: Secondary | ICD-10-CM

## 2013-08-24 LAB — CA 125: CA 125: 25.7 U/mL (ref 0.0–30.2)

## 2013-08-24 LAB — GLUCOSE, CAPILLARY
GLUCOSE-CAPILLARY: 216 mg/dL — AB (ref 70–99)
GLUCOSE-CAPILLARY: 128 mg/dL — AB (ref 70–99)
Glucose-Capillary: 123 mg/dL — ABNORMAL HIGH (ref 70–99)
Glucose-Capillary: 123 mg/dL — ABNORMAL HIGH (ref 70–99)
Glucose-Capillary: 145 mg/dL — ABNORMAL HIGH (ref 70–99)
Glucose-Capillary: 89 mg/dL (ref 70–99)

## 2013-08-24 LAB — CEA: CEA: 1.6 ng/mL (ref 0.0–5.0)

## 2013-08-24 LAB — CBC
HCT: 33.6 % — ABNORMAL LOW (ref 36.0–46.0)
HEMOGLOBIN: 10.5 g/dL — AB (ref 12.0–15.0)
MCH: 26.8 pg (ref 26.0–34.0)
MCHC: 31.3 g/dL (ref 30.0–36.0)
MCV: 85.7 fL (ref 78.0–100.0)
Platelets: 237 10*3/uL (ref 150–400)
RBC: 3.92 MIL/uL (ref 3.87–5.11)
RDW: 13.5 % (ref 11.5–15.5)
WBC: 17.1 10*3/uL — AB (ref 4.0–10.5)

## 2013-08-24 LAB — BASIC METABOLIC PANEL
Anion gap: 12 (ref 5–15)
BUN: 33 mg/dL — AB (ref 6–23)
CHLORIDE: 99 meq/L (ref 96–112)
CO2: 24 mEq/L (ref 19–32)
Calcium: 9 mg/dL (ref 8.4–10.5)
Creatinine, Ser: 1.34 mg/dL — ABNORMAL HIGH (ref 0.50–1.10)
GFR calc Af Amer: 47 mL/min — ABNORMAL LOW (ref >90)
GFR calc non Af Amer: 41 mL/min — ABNORMAL LOW (ref >90)
GLUCOSE: 112 mg/dL — AB (ref 70–99)
POTASSIUM: 4 meq/L (ref 3.7–5.3)
SODIUM: 135 meq/L — AB (ref 137–147)

## 2013-08-24 LAB — CANCER ANTIGEN 19-9: CA 19-9: 23.6 U/mL — ABNORMAL LOW (ref <35.0)

## 2013-08-24 LAB — PROTIME-INR
INR: 1.34 (ref 0.00–1.49)
PROTHROMBIN TIME: 16.6 s — AB (ref 11.6–15.2)

## 2013-08-24 MED ORDER — FENTANYL CITRATE 0.05 MG/ML IJ SOLN
INTRAMUSCULAR | Status: AC
  Filled 2013-08-24: qty 6

## 2013-08-24 MED ORDER — HEPARIN SODIUM (PORCINE) 5000 UNIT/ML IJ SOLN
5000.0000 [IU] | Freq: Three times a day (TID) | INTRAMUSCULAR | Status: DC
  Administered 2013-08-24 – 2013-08-28 (×12): 5000 [IU] via SUBCUTANEOUS
  Filled 2013-08-24 (×14): qty 1

## 2013-08-24 MED ORDER — MIDAZOLAM HCL 2 MG/2ML IJ SOLN
INTRAMUSCULAR | Status: AC
  Filled 2013-08-24: qty 6

## 2013-08-24 MED ORDER — FENTANYL CITRATE 0.05 MG/ML IJ SOLN
INTRAMUSCULAR | Status: AC | PRN
  Administered 2013-08-24 (×2): 50 ug via INTRAVENOUS

## 2013-08-24 MED ORDER — MIDAZOLAM HCL 2 MG/2ML IJ SOLN
INTRAMUSCULAR | Status: AC | PRN
  Administered 2013-08-24: 1 mg via INTRAVENOUS
  Administered 2013-08-24 (×2): 0.5 mg via INTRAVENOUS

## 2013-08-24 NOTE — Discharge Instructions (Signed)
Liver Biopsy, Care After °Refer to this sheet in the next few weeks. These instructions provide you with information on caring for yourself after your procedure. Your health care provider may also give you more specific instructions. Your treatment has been planned according to current medical practices, but problems sometimes occur. Call your health care provider if you have any problems or questions after your procedure. °WHAT TO EXPECT AFTER THE PROCEDURE °After your procedure, it is typical to have the following: °· A small amount of discomfort in the area where the biopsy was done and in the right shoulder or shoulder blade. °· A small amount of bruising around the area where the biopsy was done and on the skin over the liver. °· Sleepiness and fatigue for the rest of the day. °HOME CARE INSTRUCTIONS  °· Rest at home for 1-2 days or as directed by your health care provider. °· Have a friend or family member stay with you for at least 24 hours. °· Because of the medicines used during the procedure, you should not do the following things in the first 24 hours: °¨ Drive. °¨ Use machinery. °¨ Be responsible for the care of other people. °¨ Sign legal documents. °¨ Take a bath or shower. °· There are many different ways to close and cover an incision, including stitches, skin glue, and adhesive strips. Follow your health care provider's instructions on: °¨ Incision care. °¨ Bandage (dressing) changes and removal. °¨ Incision closure removal. °· Do not drink alcohol in the first week. °· Do not lift more than 5 pounds or play contact sports for 2 weeks after this test. °· Take medicines only as directed by your health care provider. Do not take medicine containing aspirin or non-steroidal anti-inflammatory medicines such as ibuprofen for 1 week after this test. °· It is your responsibility to get your test results. °SEEK MEDICAL CARE IF:  °· You have increased bleeding from an incision that results in more than a  small spot of blood. °· You have redness, swelling, or increasing pain in any incisions. °· You notice a discharge or a bad smell coming from any of your incisions. °· You have a fever or chills. °SEEK IMMEDIATE MEDICAL CARE IF:  °· You develop swelling, bloating, or pain in your abdomen. °· You become dizzy or faint. °· You develop a rash. °· You are nauseous or vomit. °· You have difficulty breathing, feel short of breath, or feel faint. °· You develop chest pain. °· You have problems with your speech or vision. °· You have trouble balancing or moving your arms or legs. °Document Released: 07/13/2004 Document Revised: 05/10/2013 Document Reviewed: 02/19/2013 °ExitCare® Patient Information ©2015 [employer]. This information is not intended to replace advice given to you by your health care provider. Make sure you discuss any questions you have with your health care provider. ° °

## 2013-08-24 NOTE — Progress Notes (Signed)
Patient ID: Yesenia Lyons, female   DOB: August 28, 1950, 63 y.o.   MRN: [id number]    Subjective: Patient having some abdominal discomfort.  Objective: Vital signs in last 24 hours: Temp:  [99.1 F (37.3 C)-99.6 F (37.6 C)] 99.4 F (37.4 C) (08/18 0504) Pulse Rate:  [63-102] 67 (08/18 0504) Resp:  [17-18] 18 (08/18 0504) BP: (120-145)/(64-82) 135/68 mmHg (08/18 0504) SpO2:  [88 %-100 %] 95 % (08/18 0938) Weight:  [322 lb 6.4 oz (146.24 kg)] 322 lb 6.4 oz (146.24 kg) (08/18 0625) Last BM Date: 08/23/13  Intake/Output from previous day: 08/17 0701 - 08/18 0700 In: 868.8 [I.V.:768.8; IV Piggyback:100] Out: 250 [Urine:250] Intake/Output this shift:    PE: Abd: Soft, tender in the right upper cautery, active bowel sounds, obese  Lab Results:   Recent Labs  08/23/13 1827 08/24/13 0500  WBC 20.2* 17.1*  HGB 11.2* 10.5*  HCT 35.3* 33.6*  PLT 238 237   BMET  Recent Labs  08/23/13 1016 08/23/13 1827 08/24/13 0500  NA 133*  --  135*  K 3.7  --  4.0  CL 94*  --  99  CO2 21  --  24  GLUCOSE 208*  --  112*  BUN 37*  --  33*  CREATININE 1.43* 1.39* 1.34*  CALCIUM 9.5  --  9.0   PT/INR  Recent Labs  08/24/13 0845  LABPROT 16.6*  INR 1.34   CMP     Component Value Date/Time   NA 135* 08/24/2013 0500   K 4.0 08/24/2013 0500   CL 99 08/24/2013 0500   CO2 24 08/24/2013 0500   GLUCOSE 112* 08/24/2013 0500   BUN 33* 08/24/2013 0500   CREATININE 1.34* 08/24/2013 0500   CALCIUM 9.0 08/24/2013 0500   PROT 8.6* 08/23/2013 1016   ALBUMIN 2.9* 08/23/2013 1016   AST 50* 08/23/2013 1016   ALT 17 08/23/2013 1016   ALKPHOS 264* 08/23/2013 1016   BILITOT 1.6* 08/23/2013 1016   GFRNONAA 41* 08/24/2013 0500   GFRAA 47* 08/24/2013 0500   Lipase     Component Value Date/Time   LIPASE 15 08/23/2013 1016       Studies/Results: Dg Chest 2 View  08/23/2013   CLINICAL DATA:  Cough and congestion.  Shortness of breath.  EXAM: CHEST  2 VIEW  COMPARISON:  03/29/2013.  10/29/2011.  FINDINGS:  Mediastinum and hilar structures are normal. Mild cardiomegaly and pulmonary interstitial prominence. Mild component of congestive heart failure cannot be excluded. Mild pneumonitis cannot be excluded. No pleural effusion or pneumothorax. No acute osseus abnormality.  IMPRESSION: Cardiomegaly with mild interstitial prominence. Mild component of congestive heart failure may be present. Interstitial pneumonitis cannot be excluded. These findings are less prominent than on prior chest x-ray 03/29/2013.   Electronically Signed   By: Marcello Moores  Register   On: 08/23/2013 17:05   US Abdomen Complete  08/23/2013   CLINICAL DATA:  Abdominal pain for 1 week, vomiting, question cholecystitis, history hypertension  EXAM: ULTRASOUND ABDOMEN COMPLETE  COMPARISON:  Ultrasound abdomen 03/31/2013, CT abdomen and pelvis 03/29/2013  FINDINGS: Gallbladder:  Normally distended without stones or wall thickening.  No pericholecystic fluid or sonographic Murphy sign.  Common bile duct:  Diameter: 4 mm diameter, normal  Liver:  Slightly heterogeneous echogenicity. No definite focal mass lesions. Hepatopetal portal venous flow.  IVC:  Normal appearance  Pancreas:  Tail incompletely visualized, visualized portions normal appearance  Spleen:  Normal appearance, 6.6 cm length  Right Kidney:  Length: 10.3 cm.  Normal  morphology without mass or hydronephrosis.  Left Kidney:  Length: 11.3 cm.  Normal morphology without mass or hydronephrosis.  Abdominal aorta:  Bifurcation obscured by bowel gas. Visualized portion normal caliber.  Other findings:  No free-fluid  IMPRESSION: Incomplete visualization of distal aorta and pancreatic tail.  Mildly inhomogeneous hepatic echogenicity without discrete mass.  No evidence of cholecystitis or cholelithiasis.   Electronically Signed   By: Lavonia Dana M.D.   On: 08/23/2013 12:26   Ct Abdomen Pelvis W Contrast  08/23/2013   CLINICAL DATA:  Mid abdominal pain.  Vomiting.  EXAM: CT ABDOMEN AND PELVIS WITH  CONTRAST  TECHNIQUE: Multidetector CT imaging of the abdomen and pelvis was performed using the standard protocol following bolus administration of intravenous contrast.  CONTRAST:  59mL OMNIPAQUE IOHEXOL 300 MG/ML SOLN, 1100mL OMNIPAQUE IOHEXOL 300 MG/ML SOLN  COMPARISON:  Multiple exams, including 08/23/2013 and 03/29/2013  FINDINGS: Volume loss and hazy opacities in both lower lobes favoring atelectasis over aspiration pneumonitis. Cardiomegaly observed favoring the ventricles.  Abnormal heterogeneous lesions throughout the liver favoring diffuse hepatic metastatic disease. Index peripheral right hepatic lobe lesion 4.9 x 3.7 cm, difficult to measure on the prior exam due the lack of IV contrast but probably the same.  Spleen grossly unremarkable. Left adrenal mass 5.4 by 4.9 cm, previously 4.9 x 4.6 cm.  Mildly progressive porta hepatis sudden adenopathy. An aortocaval node measures 1.5 cm in short axis, previously 1.4 cm.  Mesenteric mass is again noted with some calcification. At the level with the calcification, the mass measures 4.6 x 3.0 cm. When I measure this mass in the same fashion on the prior exam, I get 4.7 x 3.0 cm.  Kidneys and proximal ureters unremarkable. Aside from a mesenteric mass, no pelvic adenopathy is observed.  There is pelvic floor laxity with the urinary bladder extending below the pubococcygeal line and the anorectal junction well below the pubococcygeal line.  IMPRESSION: 1. Partially calcified mesenteric mass with innumerable metastatic lesions in the liver. I am unable to find the pathology report from the prior adrenal biopsy in the EPIC system. 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 Conceivably this could be metastatic lung disease, lymphoma, or other gastrointestinal primary cancer. Correlate with biopsy results. 2. Atelectasis in both lung  bases. 3. Cardiomegaly. 4. Pelvic floor laxity.   Electronically Signed   By: Sherryl Barters M.D.   On: 08/23/2013 14:32    Anti-infectives: Anti-infectives   Start     Dose/Rate Route Frequency Ordered Stop   08/24/13 0200  meropenem (MERREM) 1 g in sodium chloride 0.9 % 100 mL IVPB     1 g 200 mL/hr over 30 Minutes Intravenous Every 8 hours 08/23/13 1626     08/23/13 1630  meropenem (MERREM) 1 g in sodium chloride 0.9 % 100 mL IVPB     1 g 200 mL/hr over 30 Minutes Intravenous STAT 08/23/13 1625 08/23/13 2115   08/23/13 1545  piperacillin-tazobactam (ZOSYN) IVPB 3.375 g  Status:  Discontinued     3.375 g 12.5 mL/hr over 240 Minutes Intravenous  Once 08/23/13 1536 08/23/13 1539   08/23/13 1545  piperacillin-tazobactam (ZOSYN) IVPB 3.375 g  Status:  Discontinued     3.375 g 100 mL/hr over 30 Minutes Intravenous  Once 08/23/13 1539 08/23/13 1825       Assessment/Plan  1. Mesenteric mass with liver lesions  Plan: 1. I  have contacted interventional radiology today. I have written for an ultrasound-guided liver biopsy to determine the etiology of these masses. After their pathology is determined, if they are malignant, oncology will need to be called.   LOS: 1 day    Addylin Manke E 08/24/2013, 9:56 AM Pager: [phone]

## 2013-08-24 NOTE — Procedures (Signed)
Interventional Radiology Procedure Note  Procedure: US guided core biopsy of liver lesion Complications: None immediate Recommendations: - Bedrest x 3 hrs - Resume diet - Path pending  Signed,  Criselda Peaches, MD [facility]

## 2013-08-24 NOTE — Consult Note (Signed)
Patient seen and examined on 8/17.  Plan as above.

## 2013-08-24 NOTE — H&P (Signed)
Chief Complaint: "Abdominal pain." Referring Physician: CCS HPI: Yesenia Lyons is an 63 y.o. female who is s/p left adrenal mass biopsy 3/15 which revealed atypical cells and inadequate tissue for further testing. The patient presented 8/17 to ED c/o worsening abdominal pain, CT revealed mesenteric mass and multiple liver lesions. IR received request for image guided liver lesion biopsy. The patient admits to mid to LLQ abdominal pain currently. She denies any chest pain, shortness of breath or palpitations. She denies any active signs of bleeding or excessive bruising. The patient admits to history of sleep apnea and chronic oxygen use, however is non-compliant. She has previously tolerated sedation without complications.   Past Medical History:  Past Medical History  Diagnosis Date  . Pneumonia   . Renal disorder     renal failure  . Obesity   . Hypertension   . Diabetes mellitus   . High cholesterol   . CHF (congestive heart failure)     Past Surgical History:  Past Surgical History  Procedure Laterality Date  . Abdominal hysterectomy    . Knee reconstruction, medial patellar femoral ligament      Family History: History reviewed. No pertinent family history.  Social History:  reports that she has never smoked. She has never used smokeless tobacco. She reports that she does not drink alcohol or use illicit drugs.  Allergies:  Allergies  Allergen Reactions  . Penicillins Itching and Swelling    Medications:   Medication List    ASK your doctor about these medications       acetaminophen 650 MG CR tablet  Commonly known as:  TYLENOL  Take 650 mg by mouth every 8 (eight) hours as needed for pain.     albuterol 108 (90 BASE) MCG/ACT inhaler  Commonly known as:  PROVENTIL HFA;VENTOLIN HFA  Inhale 2 puffs into the lungs every 6 (six) hours as needed for wheezing.     amLODipine-benazepril 10-20 MG per capsule  Commonly known as:  LOTREL  Take 1 capsule by mouth daily.      atenolol 50 MG tablet  Commonly known as:  TENORMIN  Take 50 mg by mouth daily.     beclomethasone 80 MCG/ACT inhaler  Commonly known as:  QVAR  Inhale 1 puff into the lungs as needed (sob). breathing     CINNAMON PLUS CHROMIUM [phone] MCG-MG Caps  Generic drug:  Chromium-Cinnamon  Take 1 capsule by mouth daily.     feeding supplement (ENSURE COMPLETE) Liqd  Take 237 mLs by mouth 2 (two) times daily between meals.     ferrous gluconate 324 MG tablet  Commonly known as:  FERGON  Take 1 tablet (324 mg total) by mouth 2 (two) times daily with a meal.     furosemide 40 MG tablet  Commonly known as:  LASIX  Take 1 tablet (40 mg total) by mouth daily.     gabapentin 400 MG capsule  Commonly known as:  NEURONTIN  Take 400 mg by mouth 3 (three) times daily.     Ginger Root 550 MG Caps  Take 1 capsule by mouth daily.     HM CRANBERRY SUPER STRENGTH 300 MG tablet  Generic drug:  Cranberry  Take 300 mg by mouth 2 (two) times daily.     HYDROcodone-acetaminophen 5-325 MG per tablet  Commonly known as:  NORCO/VICODIN  Take 2 tablets by mouth every 6 (six) hours as needed for moderate pain.     insulin aspart 100 UNIT/ML injection  Commonly  known as:  novoLOG  Inject 40-50 Units into the skin 3 (three) times daily before meals. Inject 50 units at breakfast and 40 units at  lunch and 40 units at dinner. Pt on sliding scale     spironolactone 25 MG tablet  Commonly known as:  ALDACTONE  Take 25 mg by mouth daily.       Please HPI for pertinent positives, otherwise complete 10 system ROS negative.  Physical Exam: BP 135/68  Pulse 67  Temp(Src) 99.4 F (37.4 C) (Oral)  Resp 18  Ht 5' 10" (1.778 m)  Wt 322 lb 6.4 oz (146.24 kg)  BMI 46.26 kg/m2  SpO2 95% Body mass index is 46.26 kg/(m^2).  General Appearance:  Alert, cooperative, no distress  Head:  Normocephalic, without obvious abnormality, atraumatic  Neck: Supple, symmetrical, trachea midline  Lungs:   Clear to  auscultation bilaterally, no w/r/r, respirations unlabored without use of accessory muscles.  Chest Wall:  No tenderness or deformity  Heart:  Regular rate and rhythm, S1, S2 normal, no murmur, rub or gallop.  Abdomen:   Soft, LLQ tenderness to soft palpation, RUQ tenderness to deep palpation, non distended, (+) BS  Extremities: Extremities normal, atraumatic, no cyanosis or edema  Neurologic: Normal affect, no gross deficits.   Results for orders placed during the hospital encounter of 08/23/13 (from the past 48 hour(s))  CBC WITH DIFFERENTIAL     Status: Abnormal   Collection Time    08/23/13 10:16 AM      Result Value Ref Range   WBC 21.9 (*) 4.0 - 10.5 K/uL   RBC 4.27  3.87 - 5.11 MIL/uL   Hemoglobin 11.7 (*) 12.0 - 15.0 g/dL   HCT 35.5 (*) 36.0 - 46.0 %   MCV 83.1  78.0 - 100.0 fL   MCH 27.4  26.0 - 34.0 pg   MCHC 33.0  30.0 - 36.0 g/dL   RDW 13.5  11.5 - 15.5 %   Platelets 253  150 - 400 K/uL   Neutrophils Relative % 85 (*) 43 - 77 %   Neutro Abs 18.6 (*) 1.7 - 7.7 K/uL   Lymphocytes Relative 4 (*) 12 - 46 %   Lymphs Abs 0.9  0.7 - 4.0 K/uL   Monocytes Relative 11  3 - 12 %   Monocytes Absolute 2.3 (*) 0.1 - 1.0 K/uL   Eosinophils Relative 0  0 - 5 %   Eosinophils Absolute 0.0  0.0 - 0.7 K/uL   Basophils Relative 0  0 - 1 %   Basophils Absolute 0.0  0.0 - 0.1 K/uL  COMPREHENSIVE METABOLIC PANEL     Status: Abnormal   Collection Time    08/23/13 10:16 AM      Result Value Ref Range   Sodium 133 (*) 137 - 147 mEq/L   Potassium 3.7  3.7 - 5.3 mEq/L   Chloride 94 (*) 96 - 112 mEq/L   CO2 21  19 - 32 mEq/L   Glucose, Bld 208 (*) 70 - 99 mg/dL   BUN 37 (*) 6 - 23 mg/dL   Creatinine, Ser 1.43 (*) 0.50 - 1.10 mg/dL   Calcium 9.5  8.4 - 10.5 mg/dL   Total Protein 8.6 (*) 6.0 - 8.3 g/dL   Albumin 2.9 (*) 3.5 - 5.2 g/dL   AST 50 (*) 0 - 37 U/L   ALT 17  0 - 35 U/L   Alkaline Phosphatase 264 (*) 39 - 117 U/L   Total Bilirubin  1.6 (*) 0.3 - 1.2 mg/dL   GFR calc non Af Amer  38 (*) >90 mL/min   GFR calc Af Amer 44 (*) >90 mL/min   Comment: (NOTE)     The eGFR has been calculated using the CKD EPI equation.     This calculation has not been validated in all clinical situations.     eGFR's persistently <90 mL/min signify possible Chronic Kidney     Disease.   Anion gap 18 (*) 5 - 15  LIPASE, BLOOD     Status: None   Collection Time    08/23/13 10:16 AM      Result Value Ref Range   Lipase 15  11 - 59 U/L  URINALYSIS, ROUTINE W REFLEX MICROSCOPIC     Status: Abnormal   Collection Time    08/23/13 12:44 PM      Result Value Ref Range   Color, Urine ORANGE (*) YELLOW   Comment: BIOCHEMICALS MAY BE AFFECTED BY COLOR   APPearance CLOUDY (*) CLEAR   Specific Gravity, Urine 1.026  1.005 - 1.030   pH 5.0  5.0 - 8.0   Glucose, UA NEGATIVE  NEGATIVE mg/dL   Hgb urine dipstick NEGATIVE  NEGATIVE   Bilirubin Urine MODERATE (*) NEGATIVE   Ketones, ur NEGATIVE  NEGATIVE mg/dL   Protein, ur 30 (*) NEGATIVE mg/dL   Urobilinogen, UA 1.0  0.0 - 1.0 mg/dL   Nitrite NEGATIVE  NEGATIVE   Leukocytes, UA TRACE (*) NEGATIVE  URINE MICROSCOPIC-ADD ON     Status: Abnormal   Collection Time    08/23/13 12:44 PM      Result Value Ref Range   Squamous Epithelial / LPF MANY (*) RARE   WBC, UA 3-6  <3 WBC/hpf   RBC / HPF 0-2  <3 RBC/hpf   Bacteria, UA MANY (*) RARE   Casts HYALINE CASTS (*) NEGATIVE   Urine-Other MUCOUS PRESENT    CULTURE, BLOOD (ROUTINE X 2)     Status: None   Collection Time    08/23/13  3:52 PM      Result Value Ref Range   Specimen Description BLOOD LEFT ANTECUBITAL     Special Requests BOTTLES DRAWN AEROBIC AND ANAEROBIC 3 ML     Culture  Setup Time       Value: 08/23/2013 18:39     Performed at [facility]   Culture       Value:        BLOOD CULTURE RECEIVED NO GROWTH TO DATE CULTURE WILL BE HELD FOR 5 DAYS BEFORE ISSUING A FINAL NEGATIVE REPORT     Performed at [facility]   Report Status PENDING    GLUCOSE, CAPILLARY      Status: Abnormal   Collection Time    08/23/13  6:10 PM      Result Value Ref Range   Glucose-Capillary 165 (*) 70 - 99 mg/dL  CULTURE, BLOOD (ROUTINE X 2)     Status: None   Collection Time    08/23/13  6:27 PM      Result Value Ref Range   Specimen Description BLOOD RIGHT ARM     Special Requests BOTTLES DRAWN AEROBIC AND ANAEROBIC 6CC BOTH     Culture  Setup Time       Value: 08/23/2013 22:36     Performed at [facility]   Culture       Value:        BLOOD CULTURE RECEIVED  NO GROWTH TO DATE CULTURE WILL BE HELD FOR 5 DAYS BEFORE ISSUING A FINAL NEGATIVE REPORT     Performed at [facility]   Report Status PENDING    CANCER ANTIGEN 19-9     Status: Abnormal   Collection Time    08/23/13  6:27 PM      Result Value Ref Range   CA 19-9 23.6 (*) <35.0 U/mL   Comment: Performed at [facility]  CEA     Status: None   Collection Time    08/23/13  6:27 PM      Result Value Ref Range   CEA 1.6  0.0 - 5.0 ng/mL   Comment: Performed at [facility]  CA 125     Status: None   Collection Time    08/23/13  6:27 PM      Result Value Ref Range   CA 125 25.7  0.0 - 30.2 U/mL   Comment: Performed at [facility]  CBC     Status: Abnormal   Collection Time    08/23/13  6:27 PM      Result Value Ref Range   WBC 20.2 (*) 4.0 - 10.5 K/uL   RBC 4.15  3.87 - 5.11 MIL/uL   Hemoglobin 11.2 (*) 12.0 - 15.0 g/dL   HCT 35.3 (*) 36.0 - 46.0 %   MCV 85.1  78.0 - 100.0 fL   MCH 27.0  26.0 - 34.0 pg   MCHC 31.7  30.0 - 36.0 g/dL   RDW 13.6  11.5 - 15.5 %   Platelets 238  150 - 400 K/uL  CREATININE, SERUM     Status: Abnormal   Collection Time    08/23/13  6:27 PM      Result Value Ref Range   Creatinine, Ser 1.39 (*) 0.50 - 1.10 mg/dL   GFR calc non Af Amer 39 (*) >90 mL/min   GFR calc Af Amer 45 (*) >90 mL/min   Comment: (NOTE)     The eGFR has been calculated using the CKD EPI equation.     This calculation has not been validated in all clinical  situations.     eGFR's persistently <90 mL/min signify possible Chronic Kidney     Disease.  HEMOGLOBIN A1C     Status: Abnormal   Collection Time    08/23/13  6:27 PM      Result Value Ref Range   Hemoglobin A1C 7.7 (*) <5.7 %   Comment: (NOTE)                                                                               According to the ADA Clinical Practice Recommendations for 2011, when     HbA1c is used as a screening test:      >=6.5%   Diagnostic of Diabetes Mellitus               (if abnormal result is confirmed)     5.7-6.4%   Increased risk of developing Diabetes Mellitus     References:Diagnosis and Classification of Diabetes Mellitus,Diabetes     ONGE,9528,41(LKGMW 1):S62-S69 and Standards of Medical Care in  Diabetes - 2011,Diabetes Care,2011,34 (Suppl 1):S11-S61.   Mean Plasma Glucose 174 (*) <117 mg/dL   Comment: Performed at [facility], CAPILLARY     Status: Abnormal   Collection Time    08/24/13 12:09 AM      Result Value Ref Range   Glucose-Capillary 123 (*) 70 - 99 mg/dL   Comment 1 Documented in Chart     Comment 2 Notify RN    BASIC METABOLIC PANEL     Status: Abnormal   Collection Time    08/24/13  5:00 AM      Result Value Ref Range   Sodium 135 (*) 137 - 147 mEq/L   Potassium 4.0  3.7 - 5.3 mEq/L   Chloride 99  96 - 112 mEq/L   CO2 24  19 - 32 mEq/L   Glucose, Bld 112 (*) 70 - 99 mg/dL   BUN 33 (*) 6 - 23 mg/dL   Creatinine, Ser 1.34 (*) 0.50 - 1.10 mg/dL   Calcium 9.0  8.4 - 10.5 mg/dL   GFR calc non Af Amer 41 (*) >90 mL/min   GFR calc Af Amer 47 (*) >90 mL/min   Comment: (NOTE)     The eGFR has been calculated using the CKD EPI equation.     This calculation has not been validated in all clinical situations.     eGFR's persistently <90 mL/min signify possible Chronic Kidney     Disease.   Anion gap 12  5 - 15  CBC     Status: Abnormal   Collection Time    08/24/13  5:00 AM      Result Value Ref Range   WBC  17.1 (*) 4.0 - 10.5 K/uL   RBC 3.92  3.87 - 5.11 MIL/uL   Hemoglobin 10.5 (*) 12.0 - 15.0 g/dL   HCT 33.6 (*) 36.0 - 46.0 %   MCV 85.7  78.0 - 100.0 fL   MCH 26.8  26.0 - 34.0 pg   MCHC 31.3  30.0 - 36.0 g/dL   RDW 13.5  11.5 - 15.5 %   Platelets 237  150 - 400 K/uL  GLUCOSE, CAPILLARY     Status: Abnormal   Collection Time    08/24/13  6:19 AM      Result Value Ref Range   Glucose-Capillary 123 (*) 70 - 99 mg/dL   Comment 1 Notify RN     Comment 2 Documented in Chart    PROTIME-INR     Status: Abnormal   Collection Time    08/24/13  8:45 AM      Result Value Ref Range   Prothrombin Time 16.6 (*) 11.6 - 15.2 seconds   INR 1.34  0.00 - 1.49   Dg Chest 2 View  08/23/2013   CLINICAL DATA:  Cough and congestion.  Shortness of breath.  EXAM: CHEST  2 VIEW  COMPARISON:  03/29/2013.  10/29/2011.  FINDINGS: Mediastinum and hilar structures are normal. Mild cardiomegaly and pulmonary interstitial prominence. Mild component of congestive heart failure cannot be excluded. Mild pneumonitis cannot be excluded. No pleural effusion or pneumothorax. No acute osseus abnormality.  IMPRESSION: Cardiomegaly with mild interstitial prominence. Mild component of congestive heart failure may be present. Interstitial pneumonitis cannot be excluded. These findings are less prominent than on prior chest x-ray 03/29/2013.   Electronically Signed   By: [place]   On: 08/23/2013 17:05   US Abdomen Complete  08/23/2013   CLINICAL DATA:  Abdominal  pain for 1 week, vomiting, question cholecystitis, history hypertension  EXAM: ULTRASOUND ABDOMEN COMPLETE  COMPARISON:  Ultrasound abdomen 03/31/2013, CT abdomen and pelvis 03/29/2013  FINDINGS: Gallbladder:  Normally distended without stones or wall thickening.  No pericholecystic fluid or sonographic Murphy sign.  Common bile duct:  Diameter: 4 mm diameter, normal  Liver:  Slightly heterogeneous echogenicity. No definite focal mass lesions. Hepatopetal portal venous  flow.  IVC:  Normal appearance  Pancreas:  Tail incompletely visualized, visualized portions normal appearance  Spleen:  Normal appearance, 6.6 cm length  Right Kidney:  Length: 10.3 cm.  Normal morphology without mass or hydronephrosis.  Left Kidney:  Length: 11.3 cm.  Normal morphology without mass or hydronephrosis.  Abdominal aorta:  Bifurcation obscured by bowel gas. Visualized portion normal caliber.  Other findings:  No free-fluid  IMPRESSION: Incomplete visualization of distal aorta and pancreatic tail.  Mildly inhomogeneous hepatic echogenicity without discrete mass.  No evidence of cholecystitis or cholelithiasis.   Electronically Signed   By: Lavonia Dana M.D.   On: 08/23/2013 12:26   Ct Abdomen Pelvis W Contrast  08/23/2013   CLINICAL DATA:  Mid abdominal pain.  Vomiting.  EXAM: CT ABDOMEN AND PELVIS WITH CONTRAST  TECHNIQUE: Multidetector CT imaging of the abdomen and pelvis was performed using the standard protocol following bolus administration of intravenous contrast.  CONTRAST:  87m OMNIPAQUE IOHEXOL 300 MG/ML SOLN, 1029mOMNIPAQUE IOHEXOL 300 MG/ML SOLN  COMPARISON:  Multiple exams, including 08/23/2013 and 03/29/2013  FINDINGS: Volume loss and hazy opacities in both lower lobes favoring atelectasis over aspiration pneumonitis. Cardiomegaly observed favoring the ventricles.  Abnormal heterogeneous lesions throughout the liver favoring diffuse hepatic metastatic disease. Index peripheral right hepatic lobe lesion 4.9 x 3.7 cm, difficult to measure on the prior exam due the lack of IV contrast but probably the same.  Spleen grossly unremarkable. Left adrenal mass 5.4 by 4.9 cm, previously 4.9 x 4.6 cm.  Mildly progressive porta hepatis sudden adenopathy. An aortocaval node measures 1.5 cm in short axis, previously 1.4 cm.  Mesenteric mass is again noted with some calcification. At the level with the calcification, the mass measures 4.6 x 3.0 cm. When I measure this mass in the same fashion on the  prior exam, I get 4.7 x 3.0 cm.  Kidneys and proximal ureters unremarkable. Aside from a mesenteric mass, no pelvic adenopathy is observed.  There is pelvic floor laxity with the urinary bladder extending below the pubococcygeal line and the anorectal junction well below the pubococcygeal line.  IMPRESSION: 1. Partially calcified mesenteric mass with innumerable metastatic lesions in the liver. I am unable to find the pathology report from the prior adrenal biopsy in the EPIC system. 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 Conceivably this could be metastatic lung disease, lymphoma, or other gastrointestinal primary cancer. Correlate with biopsy results. 2. Atelectasis in both lung bases. 3. Cardiomegaly. 4. Pelvic floor laxity.   Electronically Signed   By: WaSherryl Barters.D.   On: 08/23/2013 14:32    Assessment/Plan Abdominal pain Multiple liver lesions Mesenteric mass S/p left adrenal mass biopsy 3/15, findings atypical cells with inadequate tissue.  Request for image guided liver lesion biopsy with moderate sedation. Patient has been NPO, no blood thinners taken, labs and imaging reviewed.  Risks and Benefits discussed with the patient. All of the patient's questions were answered, patient is agreeable to proceed. Consent signed  and in chart.    Tsosie Billing D PA-C 08/24/2013, 10:50 AM

## 2013-08-24 NOTE — Progress Notes (Signed)
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 -liver biopsy to be attempted today; will follow pathology -continue supportive care -base on biopsy results will need to involve oncology  2-diabetes type 2: continue SSI -A1C 7.7  3-ARF: improving with IVF's -continue holding diuretics and ACE -follow Cr trend  4-HTN: holding diuretics and ACE given renal failure -will continue hydralazine for now -low sodium diet  5-CHF: chronic and compensated at this time, diastolic according to patient; but not echo on records -will follow 2-d echo -currently compensated -follow strict I's and O's and daily weights -no SOB or orthopnea currently  6- COPD: Continue nebulizer treatments as needed on a when necessary basis. Compensated no wheezing on exam.   Code Status: Full Family Communication: no family at bedside Disposition Plan: to be determined   Consultants:  IR  CCS  Procedures:  Liver biopsy (8/18)  Antibiotics:  Meropenem   HPI/Subjective: No fever; AAOX3, denies SOB. Patient complaining of abd pain  Objective: Filed Vitals:   08/24/13 1250  BP: 118/60  Pulse: 72  Temp: 98.6 F (37 C)  Resp: 20    Intake/Output Summary (Last 24 hours) at 08/24/13 1322 Last data filed at 08/24/13 0900  Gross per 24 hour  Intake 868.75 ml  Output    250 ml  Net 618.75 ml   Filed Weights   08/23/13 0925 08/24/13 0625  Weight: 147.419 kg (325 lb) 146.24 kg (322 lb 6.4 oz)    Exam:   General:  AAOX3, no fever; complaining of abd pain  Cardiovascular: S1 and S2, no rubs or gallops  Respiratory: CTA bilaterally  Abdomen: soft, no guarding; tender to palpation (mainly right side, mid-section and RUQ); positive BS  Musculoskeletal: no cyanosis or clubbing   Data Reviewed: Basic Metabolic  Panel:  Recent Labs Lab 08/23/13 1016 08/23/13 1827 08/24/13 0500  NA 133*  --  135*  K 3.7  --  4.0  CL 94*  --  99  CO2 21  --  24  GLUCOSE 208*  --  112*  BUN 37*  --  33*  CREATININE 1.43* 1.39* 1.34*  CALCIUM 9.5  --  9.0   Liver Function Tests:  Recent Labs Lab 08/23/13 1016  AST 50*  ALT 17  ALKPHOS 264*  BILITOT 1.6*  PROT 8.6*  ALBUMIN 2.9*    Recent Labs Lab 08/23/13 1016  LIPASE 15   CBC:  Recent Labs Lab 08/23/13 1016 08/23/13 1827 08/24/13 0500  WBC 21.9* 20.2* 17.1*  NEUTROABS 18.6*  --   --   HGB 11.7* 11.2* 10.5*  HCT 35.5* 35.3* 33.6*  MCV 83.1 85.1 85.7  PLT 253 238 237   BNP (last 3 results)  Recent Labs  03/29/13 1340  PROBNP 399.1*   CBG:  Recent Labs Lab 08/23/13 1810 08/24/13 0009 08/24/13 0619 08/24/13 1230  GLUCAP 165* 123* 123* 89    Recent Results (from the past 240 hour(s))  CULTURE, BLOOD (ROUTINE X 2)     Status: None   Collection Time    08/23/13  3:52 PM      Result Value Ref Range Status   Specimen Description BLOOD LEFT ANTECUBITAL   Final   Special Requests BOTTLES DRAWN AEROBIC AND ANAEROBIC 3 ML   Final   Culture  Setup Time     Final   Value: 08/23/2013 18:39  Performed at [facility]     Final   Value:        BLOOD CULTURE RECEIVED NO GROWTH TO DATE CULTURE WILL BE HELD FOR 5 DAYS BEFORE ISSUING A FINAL NEGATIVE REPORT     Performed at [facility]   Report Status PENDING   Incomplete  CULTURE, BLOOD (ROUTINE X 2)     Status: None   Collection Time    08/23/13  6:27 PM      Result Value Ref Range Status   Specimen Description BLOOD RIGHT ARM   Final   Special Requests BOTTLES DRAWN AEROBIC AND ANAEROBIC [facility] BOTH   Final   Culture  Setup Time     Final   Value: 08/23/2013 22:36     Performed at [facility]   Culture     Final   Value:        BLOOD CULTURE RECEIVED NO GROWTH TO DATE CULTURE WILL BE HELD FOR 5 DAYS BEFORE ISSUING A FINAL NEGATIVE REPORT      Performed at [facility]   Report Status PENDING   Incomplete     Studies: Dg Chest 2 View  08/23/2013   CLINICAL DATA:  Cough and congestion.  Shortness of breath.  EXAM: CHEST  2 VIEW  COMPARISON:  03/29/2013.  10/29/2011.  FINDINGS: Mediastinum and hilar structures are normal. Mild cardiomegaly and pulmonary interstitial prominence. Mild component of congestive heart failure cannot be excluded. Mild pneumonitis cannot be excluded. No pleural effusion or pneumothorax. No acute osseus abnormality.  IMPRESSION: Cardiomegaly with mild interstitial prominence. Mild component of congestive heart failure may be present. Interstitial pneumonitis cannot be excluded. These findings are less prominent than on prior chest x-ray 03/29/2013.   Electronically Signed   By: Marcello Moores  Register   On: 08/23/2013 17:05   US Abdomen Complete  08/23/2013   CLINICAL DATA:  Abdominal pain for 1 week, vomiting, question cholecystitis, history hypertension  EXAM: ULTRASOUND ABDOMEN COMPLETE  COMPARISON:  Ultrasound abdomen 03/31/2013, CT abdomen and pelvis 03/29/2013  FINDINGS: Gallbladder:  Normally distended without stones or wall thickening.  No pericholecystic fluid or sonographic Murphy sign.  Common bile duct:  Diameter: 4 mm diameter, normal  Liver:  Slightly heterogeneous echogenicity. No definite focal mass lesions. Hepatopetal portal venous flow.  IVC:  Normal appearance  Pancreas:  Tail incompletely visualized, visualized portions normal appearance  Spleen:  Normal appearance, 6.6 cm length  Right Kidney:  Length: 10.3 cm.  Normal morphology without mass or hydronephrosis.  Left Kidney:  Length: 11.3 cm.  Normal morphology without mass or hydronephrosis.  Abdominal aorta:  Bifurcation obscured by bowel gas. Visualized portion normal caliber.  Other findings:  No free-fluid  IMPRESSION: Incomplete visualization of distal aorta and pancreatic tail.  Mildly inhomogeneous hepatic echogenicity without discrete  mass.  No evidence of cholecystitis or cholelithiasis.   Electronically Signed   By: Lavonia Dana M.D.   On: 08/23/2013 12:26   Ct Abdomen Pelvis W Contrast  08/23/2013   CLINICAL DATA:  Mid abdominal pain.  Vomiting.  EXAM: CT ABDOMEN AND PELVIS WITH CONTRAST  TECHNIQUE: Multidetector CT imaging of the abdomen and pelvis was performed using the standard protocol following bolus administration of intravenous contrast.  CONTRAST:  7mL OMNIPAQUE IOHEXOL 300 MG/ML SOLN, 136mL OMNIPAQUE IOHEXOL 300 MG/ML SOLN  COMPARISON:  Multiple exams, including 08/23/2013 and 03/29/2013  FINDINGS: Volume loss and hazy opacities in both lower lobes favoring atelectasis over aspiration pneumonitis.  Cardiomegaly observed favoring the ventricles.  Abnormal heterogeneous lesions throughout the liver favoring diffuse hepatic metastatic disease. Index peripheral right hepatic lobe lesion 4.9 x 3.7 cm, difficult to measure on the prior exam due the lack of IV contrast but probably the same.  Spleen grossly unremarkable. Left adrenal mass 5.4 by 4.9 cm, previously 4.9 x 4.6 cm.  Mildly progressive porta hepatis sudden adenopathy. An aortocaval node measures 1.5 cm in short axis, previously 1.4 cm.  Mesenteric mass is again noted with some calcification. At the level with the calcification, the mass measures 4.6 x 3.0 cm. When I measure this mass in the same fashion on the prior exam, I get 4.7 x 3.0 cm.  Kidneys and proximal ureters unremarkable. Aside from a mesenteric mass, no pelvic adenopathy is observed.  There is pelvic floor laxity with the urinary bladder extending below the pubococcygeal line and the anorectal junction well below the pubococcygeal line.  IMPRESSION: 1. Partially calcified mesenteric mass with innumerable metastatic lesions in the liver. I am unable to find the pathology report from the prior adrenal biopsy in the EPIC system. The appearance in the abdomen would be most compatible with metastatic carcinoid tumor  with extends of liver involvement, although the adrenal involvement is mildly atypical. Conceivably this could be metastatic lung disease, lymphoma, or other gastrointestinal primary cancer. Correlate with biopsy results. 2. Atelectasis in both lung bases. 3. Cardiomegaly. 4. Pelvic floor laxity.   Electronically Signed   By: Sherryl Barters M.D.   On: 08/23/2013 14:32    Scheduled Meds: . atenolol  50 mg Oral Daily  . fluticasone  2 puff Inhalation BID  . gabapentin  400 mg Oral TID  . heparin  5,000 Units Subcutaneous 3 times per day  . insulin aspart  0-20 Units Subcutaneous Q6H  . meropenem (MERREM) IV  1 g Intravenous Q8H  . sodium chloride  3 mL Intravenous Q12H   Continuous Infusions: . sodium chloride 75 mL/hr at 08/24/13 1032    Active Problems:   Abdominal pain   CHF (congestive heart failure)   Diabetes mellitus   Weight loss, non-intentional   HTN (hypertension)   Obesity   SIRS (systemic inflammatory response syndrome)   Carcinoid tumor of abdomen   Abdominal infection    Time spent:< 30 minutes    Barton Dubois  [facility] Pager [phone]. If 7PM-7AM, please contact night-coverage at [email or web address], password [email or web address] 08/24/2013, 1:22 PM  LOS: 1 day

## 2013-08-24 NOTE — Progress Notes (Signed)
Patient seen and examined.  Agree with PA's note.  

## 2013-08-24 NOTE — Progress Notes (Signed)
Echocardiogram 2D Echocardiogram has been performed.  Yesenia Lyons 08/24/2013, 2:32 PM

## 2013-08-24 NOTE — ED Provider Notes (Signed)
Medical screening examination/treatment/procedure(s) were conducted as a shared visit with non-physician practitioner(s) and myself.  I personally evaluated the patient during the encounter   .Face to face Exam:  General:  A&Ox3 HEENT:  Atraumatic Resp:  Normal effort Abd:  Nondistended Neuro:No focal deficits     Dot Lanes, MD 08/24/13 [phone]

## 2013-08-25 LAB — GLUCOSE, CAPILLARY
Glucose-Capillary: 158 mg/dL — ABNORMAL HIGH (ref 70–99)
Glucose-Capillary: 150 mg/dL — ABNORMAL HIGH (ref 70–99)
Glucose-Capillary: 117 mg/dL — ABNORMAL HIGH (ref 70–99)

## 2013-08-25 MED ORDER — CIPROFLOXACIN HCL 500 MG PO TABS
500.0000 mg | ORAL_TABLET | Freq: Two times a day (BID) | ORAL | Status: DC
  Administered 2013-08-25 – 2013-08-27 (×4): 500 mg via ORAL
  Filled 2013-08-25 (×7): qty 1

## 2013-08-25 MED ORDER — METRONIDAZOLE 500 MG PO TABS
500.0000 mg | ORAL_TABLET | Freq: Three times a day (TID) | ORAL | Status: DC
  Administered 2013-08-25 – 2013-08-27 (×6): 500 mg via ORAL
  Filled 2013-08-25 (×10): qty 1

## 2013-08-25 NOTE — Progress Notes (Signed)
Pt's IV infiltrated. IV team paged. Unable to get IV access, even when using guided [place]. MD made aware. IV abx switched to po antibiotics. Will continue to monitor pt.

## 2013-08-25 NOTE — Progress Notes (Signed)
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 -liver biopsy completed. Will await results.  -continue supportive care -base on biopsy results will need to involve oncology  2-diabetes type 2:  - continue SSI - A1C 7.7  3-ARF: improving with IVF's -continue holding diuretics and ACE -follow Cr trend, currently trending down  4-HTN: holding diuretics and ACE given renal failure -will continue hydralazine for now -low sodium diet  5-CHF: chronic and compensated at this time, diastolic according to patient; but not echo on records -will follow 2-d echo -currently compensated -follow strict I's and O's and daily weights -no SOB or orthopnea currently  6- COPD: Continue nebulizer treatments as needed on a when necessary basis. Compensated no wheezing on exam.  7. Leukocytosis - Will cover with oral antibiotics cipro and flagyl as patient has lost IV access - Could be related to mesenteric mass listed above or stress reaction. Etiology uncertain at this juncture as such will continue antibiotic regimen currently.  Code Status: Full Family Communication: no family at bedside Disposition Plan: to be determined   Consultants:  IR  CCS  Procedures:  Liver biopsy (8/18)  Antibiotics:  Meropenem   HPI/Subjective: No new complaints. No acute issues reported overnight.  Objective: Filed Vitals:   08/25/13 1512  BP: 140/76  Pulse:   Temp:   Resp:     Intake/Output Summary (Last 24 hours) at 08/25/13 1626 Last data filed at 08/25/13 1300  Gross per 24 hour  Intake 2878.75 ml  Output    350 ml  Net 2528.75 ml   Filed Weights   08/23/13 0925 08/24/13 0625 08/25/13 0553  Weight: 147.419 kg (325 lb) 146.24 kg (322 lb 6.4 oz) 146.603 kg (323 lb 3.2 oz)    Exam:   General:  AAOX3, no fever;  comfortably currently  Cardiovascular: S1 and S2, no rubs or gallops  Respiratory: CTA bilaterally, no wheezes  Abdomen: soft, no guarding; tender to palpation (mainly right side, mid-section and RUQ); positive BS  Musculoskeletal: no cyanosis or clubbing   Data Reviewed: Basic Metabolic Panel:  Recent Labs Lab 08/23/13 1016 08/23/13 1827 08/24/13 0500  NA 133*  --  135*  K 3.7  --  4.0  CL 94*  --  99  CO2 21  --  24  GLUCOSE 208*  --  112*  BUN 37*  --  33*  CREATININE 1.43* 1.39* 1.34*  CALCIUM 9.5  --  9.0   Liver Function Tests:  Recent Labs Lab 08/23/13 1016  AST 50*  ALT 17  ALKPHOS 264*  BILITOT 1.6*  PROT 8.6*  ALBUMIN 2.9*    Recent Labs Lab 08/23/13 1016  LIPASE 15   CBC:  Recent Labs Lab 08/23/13 1016 08/23/13 1827 08/24/13 0500  WBC 21.9* 20.2* 17.1*  NEUTROABS 18.6*  --   --   HGB 11.7* 11.2* 10.5*  HCT 35.5* 35.3* 33.6*  MCV 83.1 85.1 85.7  PLT 253 238 237   BNP (last 3 results)  Recent Labs  03/29/13 1340  PROBNP 399.1*   CBG:  Recent Labs Lab 08/24/13 1745 08/24/13 2105 08/24/13 2350 08/25/13 0528 08/25/13 1203  GLUCAP 216* 128* 145* 158* 150*    Recent Results (from the past 240 hour(s))  CULTURE, BLOOD (ROUTINE X 2)     Status: None   Collection Time    08/23/13  3:52 PM  Result Value Ref Range Status   Specimen Description BLOOD LEFT ANTECUBITAL   Final   Special Requests BOTTLES DRAWN AEROBIC AND ANAEROBIC 3 ML   Final   Culture  Setup Time     Final   Value: 08/23/2013 18:39     Performed at [facility]   Culture     Final   Value:        BLOOD CULTURE RECEIVED NO GROWTH TO DATE CULTURE WILL BE HELD FOR 5 DAYS BEFORE ISSUING A FINAL NEGATIVE REPORT     Performed at [facility]   Report Status PENDING   Incomplete  CULTURE, BLOOD (ROUTINE X 2)     Status: None   Collection Time    08/23/13  6:27 PM      Result Value Ref Range Status   Specimen Description BLOOD RIGHT ARM   Final    Special Requests BOTTLES DRAWN AEROBIC AND ANAEROBIC 6CC BOTH   Final   Culture  Setup Time     Final   Value: 08/23/2013 22:36     Performed at [facility]   Culture     Final   Value:        BLOOD CULTURE RECEIVED NO GROWTH TO DATE CULTURE WILL BE HELD FOR 5 DAYS BEFORE ISSUING A FINAL NEGATIVE REPORT     Performed at [facility]   Report Status PENDING   Incomplete     Studies: Dg Chest 2 View  08/23/2013   CLINICAL DATA:  Cough and congestion.  Shortness of breath.  EXAM: CHEST  2 VIEW  COMPARISON:  03/29/2013.  10/29/2011.  FINDINGS: Mediastinum and hilar structures are normal. Mild cardiomegaly and pulmonary interstitial prominence. Mild component of congestive heart failure cannot be excluded. Mild pneumonitis cannot be excluded. No pleural effusion or pneumothorax. No acute osseus abnormality.  IMPRESSION: Cardiomegaly with mild interstitial prominence. Mild component of congestive heart failure may be present. Interstitial pneumonitis cannot be excluded. These findings are less prominent than on prior chest x-ray 03/29/2013.   Electronically Signed   By: Marcello Moores  Register   On: 08/23/2013 17:05   Ct Biopsy  08/24/2013   CLINICAL DATA:  63 year old female with partially calcified mesenteric mass, numerous hepatic lesions and a left adrenal lesion. Overall, the imaging findings are concerning for metastatic carcinoid. The patient underwent percutaneous biopsy of the left adrenal mass on 03/31/2013. The biopsy demonstrated atypical cells but yielded insufficient material for diagnosis. Patient presents for attempted CT-guided biopsy of one of her hepatic lesions.  EXAM: CT BIOPSY  Date: 08/24/2013  PROCEDURE: 1. CT-guided fine-needle aspiration of segment 2 hepatic lesion 2. CT-guided core biopsy of segment 2 hepatic lesion Interventional Radiologist:  Criselda Peaches, MD  ANESTHESIA/SEDATION: Moderate (conscious) sedation was used. 21 mg Versed, 100 mcg Fentanyl were  administered intravenously. The patient's vital signs were monitored continuously by radiology nursing throughout the procedure.  Sedation Time: 34 minutes  TECHNIQUE: Informed consent was obtained from the patient following explanation of the procedure, risks, benefits and alternatives. The patient understands, agrees and consents for the procedure. All questions were addressed. A time out was performed.  A planning axial CT scan was performed. The hypodense lesion in the posterior aspect of hepatic segment tube was identified. A suitable skin entry site was selected and marked. The region was then sterilely prepped and draped in the standard fashion using Betadine skin prep.  Local anesthesia was attained by infiltration with 1% lidocaine. A small dermatotomy  was made. Using intermittent CT guidance, a 17 gauge trocar needle was advanced into the left hepatic lobe and positioned at the margin of the mass. Given the patient's prior nondiagnostic biopsy, fine needle aspiration biopsies were initially obtained using a 20 gauge Francine needle. On-site cytopathologic evaluation confirmed diagnostic material. To facilitate additional immunohistochemical staining, several 18 gauge core biopsies were then coaxially obtained using the BioPince automated biopsy device.  As the 17 gauge trocar needle was removed, the biopsy tract was embolized with a Gel-Foam slurry. Post biopsy axial CT imaging demonstrates no hematoma or complication. The patient tolerated the procedure well.  COMPLICATIONS: None.  IMPRESSION: 1. Technically successful CT-guided fine needle aspiration of left hepatic lesion. 2. CT-guided core biopsy of left hepatic lesion. Signed,  Criselda Peaches, MD  [facility]  [facility]   Electronically Signed   By: Jacqulynn Cadet M.D.   On: 08/24/2013 14:14    Scheduled Meds: . atenolol  50 mg Oral Daily  . ciprofloxacin  500 mg Oral BID  . fluticasone  2  puff Inhalation BID  . gabapentin  400 mg Oral TID  . heparin  5,000 Units Subcutaneous 3 times per day  . insulin aspart  0-20 Units Subcutaneous Q6H  . metroNIDAZOLE  500 mg Oral 3 times per day  . sodium chloride  3 mL Intravenous Q12H   Continuous Infusions: . sodium chloride Stopped (08/25/13 1606)    Time spent:< 35 minutes   Velvet Bathe  [facility] Pager [phone] If 7PM-7AM, please contact night-coverage at [email or web address], password [facility] 08/25/2013, 4:26 PM  LOS: 2 days

## 2013-08-25 NOTE — Progress Notes (Signed)
Subjective: Still with abdominal pain.  Objective: Vital signs in last 24 hours: Temp:  [98.1 F (36.7 C)-101.2 F (38.4 C)] 98.1 F (36.7 C) (08/19 0517) Pulse Rate:  [59-86] 86 (08/19 0517) Resp:  [18-28] 19 (08/19 0517) BP: (91-135)/(41-77) 103/69 mmHg (08/19 0517) SpO2:  [94 %-100 %] 100 % (08/19 0517) Weight:  [323 lb 3.2 oz (146.603 kg)] 323 lb 3.2 oz (146.603 kg) (08/19 0553) Last BM Date: 08/24/13  Intake/Output from previous day: 08/18 0701 - 08/19 0700 In: 2398.8 [P.O.:690; I.V.:1708.8] Out: 350 [Urine:350] Intake/Output this shift:    PE: General- In NAD Abdomen-soft, tender over needle biopsy site and in mid abdomen  Lab Results:   Recent Labs  08/23/13 1827 08/24/13 0500  WBC 20.2* 17.1*  HGB 11.2* 10.5*  HCT 35.3* 33.6*  PLT 238 237   BMET  Recent Labs  08/23/13 1016 08/23/13 1827 08/24/13 0500  NA 133*  --  135*  K 3.7  --  4.0  CL 94*  --  99  CO2 21  --  24  GLUCOSE 208*  --  112*  BUN 37*  --  33*  CREATININE 1.43* 1.39* 1.34*  CALCIUM 9.5  --  9.0   PT/INR  Recent Labs  08/24/13 0845  LABPROT 16.6*  INR 1.34   Comprehensive Metabolic Panel:    Component Value Date/Time   NA 135* 08/24/2013 0500   NA 133* 08/23/2013 1016   K 4.0 08/24/2013 0500   K 3.7 08/23/2013 1016   CL 99 08/24/2013 0500   CL 94* 08/23/2013 1016   CO2 24 08/24/2013 0500   CO2 21 08/23/2013 1016   BUN 33* 08/24/2013 0500   BUN 37* 08/23/2013 1016   CREATININE 1.34* 08/24/2013 0500   CREATININE 1.39* 08/23/2013 1827   GLUCOSE 112* 08/24/2013 0500   GLUCOSE 208* 08/23/2013 1016   CALCIUM 9.0 08/24/2013 0500   CALCIUM 9.5 08/23/2013 1016   AST 50* 08/23/2013 1016   AST 16 03/31/2013 0440   ALT 17 08/23/2013 1016   ALT 15 03/31/2013 0440   ALKPHOS 264* 08/23/2013 1016   ALKPHOS 245* 03/31/2013 0440   BILITOT 1.6* 08/23/2013 1016   BILITOT 0.7 03/31/2013 0440   PROT 8.6* 08/23/2013 1016   PROT 7.5 03/31/2013 0440   ALBUMIN 2.9* 08/23/2013 1016   ALBUMIN 2.4*  03/31/2013 0440     Studies/Results: Dg Chest 2 View  08/23/2013   CLINICAL DATA:  Cough and congestion.  Shortness of breath.  EXAM: CHEST  2 VIEW  COMPARISON:  03/29/2013.  10/29/2011.  FINDINGS: Mediastinum and hilar structures are normal. Mild cardiomegaly and pulmonary interstitial prominence. Mild component of congestive heart failure cannot be excluded. Mild pneumonitis cannot be excluded. No pleural effusion or pneumothorax. No acute osseus abnormality.  IMPRESSION: Cardiomegaly with mild interstitial prominence. Mild component of congestive heart failure may be present. Interstitial pneumonitis cannot be excluded. These findings are less prominent than on prior chest x-ray 03/29/2013.   Electronically Signed   By: Marcello Moores  Register   On: 08/23/2013 17:05   US Abdomen Complete  08/23/2013   CLINICAL DATA:  Abdominal pain for 1 week, vomiting, question cholecystitis, history hypertension  EXAM: ULTRASOUND ABDOMEN COMPLETE  COMPARISON:  Ultrasound abdomen 03/31/2013, CT abdomen and pelvis 03/29/2013  FINDINGS: Gallbladder:  Normally distended without stones or wall thickening.  No pericholecystic fluid or sonographic Murphy sign.  Common bile duct:  Diameter: 4 mm diameter, normal  Liver:  Slightly heterogeneous echogenicity. No definite focal mass lesions.  Hepatopetal portal venous flow.  IVC:  Normal appearance  Pancreas:  Tail incompletely visualized, visualized portions normal appearance  Spleen:  Normal appearance, 6.6 cm length  Right Kidney:  Length: 10.3 cm.  Normal morphology without mass or hydronephrosis.  Left Kidney:  Length: 11.3 cm.  Normal morphology without mass or hydronephrosis.  Abdominal aorta:  Bifurcation obscured by bowel gas. Visualized portion normal caliber.  Other findings:  No free-fluid  IMPRESSION: Incomplete visualization of distal aorta and pancreatic tail.  Mildly inhomogeneous hepatic echogenicity without discrete mass.  No evidence of cholecystitis or cholelithiasis.    Electronically Signed   By: Lavonia Dana M.D.   On: 08/23/2013 12:26   Ct Abdomen Pelvis W Contrast  08/23/2013   CLINICAL DATA:  Mid abdominal pain.  Vomiting.  EXAM: CT ABDOMEN AND PELVIS WITH CONTRAST  TECHNIQUE: Multidetector CT imaging of the abdomen and pelvis was performed using the standard protocol following bolus administration of intravenous contrast.  CONTRAST:  60mL OMNIPAQUE IOHEXOL 300 MG/ML SOLN, 142mL OMNIPAQUE IOHEXOL 300 MG/ML SOLN  COMPARISON:  Multiple exams, including 08/23/2013 and 03/29/2013  FINDINGS: Volume loss and hazy opacities in both lower lobes favoring atelectasis over aspiration pneumonitis. Cardiomegaly observed favoring the ventricles.  Abnormal heterogeneous lesions throughout the liver favoring diffuse hepatic metastatic disease. Index peripheral right hepatic lobe lesion 4.9 x 3.7 cm, difficult to measure on the prior exam due the lack of IV contrast but probably the same.  Spleen grossly unremarkable. Left adrenal mass 5.4 by 4.9 cm, previously 4.9 x 4.6 cm.  Mildly progressive porta hepatis sudden adenopathy. An aortocaval node measures 1.5 cm in short axis, previously 1.4 cm.  Mesenteric mass is again noted with some calcification. At the level with the calcification, the mass measures 4.6 x 3.0 cm. When I measure this mass in the same fashion on the prior exam, I get 4.7 x 3.0 cm.  Kidneys and proximal ureters unremarkable. Aside from a mesenteric mass, no pelvic adenopathy is observed.  There is pelvic floor laxity with the urinary bladder extending below the pubococcygeal line and the anorectal junction well below the pubococcygeal line.  IMPRESSION: 1. Partially calcified mesenteric mass with innumerable metastatic lesions in the liver. I am unable to find the pathology report from the prior adrenal biopsy in the EPIC system. 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 Conceivably this could be metastatic lung disease, lymphoma, or other gastrointestinal primary cancer. Correlate with biopsy results. 2. Atelectasis in both lung bases. 3. Cardiomegaly. 4. Pelvic floor laxity.   Electronically Signed   By: Sherryl Barters M.D.   On: 08/23/2013 14:32   Ct Biopsy  08/24/2013   CLINICAL DATA:  63 year old female with partially calcified mesenteric mass, numerous hepatic lesions and a left adrenal lesion. Overall, the imaging findings are concerning for metastatic carcinoid. The patient underwent percutaneous biopsy of the left adrenal mass on 03/31/2013. The biopsy demonstrated atypical cells but yielded insufficient material for diagnosis. Patient presents for attempted CT-guided biopsy of one of her hepatic lesions.  EXAM: CT BIOPSY  Date: 08/24/2013  PROCEDURE: 1. CT-guided fine-needle aspiration of segment 2 hepatic lesion 2. CT-guided core biopsy of segment 2 hepatic lesion Interventional Radiologist:  Criselda Peaches, MD  ANESTHESIA/SEDATION: Moderate (conscious) sedation was used. 21 mg Versed, 100 mcg Fentanyl were administered intravenously. The patient's vital signs were monitored continuously by radiology nursing throughout the procedure.  Sedation Time: 34 minutes  TECHNIQUE: Informed consent was obtained from the patient following explanation of the procedure, risks, benefits and alternatives. The patient understands, agrees and consents for the procedure. All questions were addressed. A time out was performed.  A planning axial CT scan was performed. The hypodense lesion in the posterior aspect of hepatic segment tube was identified. A suitable skin entry site was selected and marked. The region was then sterilely prepped and draped in the standard fashion using Betadine skin prep.  Local anesthesia was attained by infiltration with 1% lidocaine. A small dermatotomy was made. Using intermittent CT guidance, a 17 gauge trocar needle  was advanced into the left hepatic lobe and positioned at the margin of the mass. Given the patient's prior nondiagnostic biopsy, fine needle aspiration biopsies were initially obtained using a 20 gauge Francine needle. On-site cytopathologic evaluation confirmed diagnostic material. To facilitate additional immunohistochemical staining, several 18 gauge core biopsies were then coaxially obtained using the BioPince automated biopsy device.  As the 17 gauge trocar needle was removed, the biopsy tract was embolized with a Gel-Foam slurry. Post biopsy axial CT imaging demonstrates no hematoma or complication. The patient tolerated the procedure well.  COMPLICATIONS: None.  IMPRESSION: 1. Technically successful CT-guided fine needle aspiration of left hepatic lesion. 2. CT-guided core biopsy of left hepatic lesion. Signed,  Criselda Peaches, MD  [facility]  [facility]   Electronically Signed   By: Jacqulynn Cadet M.D.   On: 08/24/2013 14:14    Anti-infectives: Anti-infectives   Start     Dose/Rate Route Frequency Ordered Stop   08/24/13 0200  meropenem (MERREM) 1 g in sodium chloride 0.9 % 100 mL IVPB     1 g 200 mL/hr over 30 Minutes Intravenous Every 8 hours 08/23/13 1626     08/23/13 1630  meropenem (MERREM) 1 g in sodium chloride 0.9 % 100 mL IVPB     1 g 200 mL/hr over 30 Minutes Intravenous STAT 08/23/13 1625 08/23/13 2115   08/23/13 1545  piperacillin-tazobactam (ZOSYN) IVPB 3.375 g  Status:  Discontinued     3.375 g 12.5 mL/hr over 240 Minutes Intravenous  Once 08/23/13 1536 08/23/13 1539   08/23/13 1545  piperacillin-tazobactam (ZOSYN) IVPB 3.375 g  Status:  Discontinued     3.375 g 100 mL/hr over 30 Minutes Intravenous  Once 08/23/13 1539 08/23/13 1825      Assessment Active Problems:   Abdominal pain-with multiple lesions in liver, intraabdominal adenopathy and mesenteric tumor-needle biopsy of liver lesion done yesterday.   CHF  (congestive heart failure)   Diabetes mellitus   Weight loss, non-intentional   HTN (hypertension)   Obesity       LOS: 2 days   Plan: Await result of biopsy.   Jasmon Mattice J 08/25/2013

## 2013-08-26 DIAGNOSIS — C787 Secondary malignant neoplasm of liver and intrahepatic bile duct: Principal | ICD-10-CM

## 2013-08-26 DIAGNOSIS — D72829 Elevated white blood cell count, unspecified: Secondary | ICD-10-CM

## 2013-08-26 DIAGNOSIS — C7A029 Malignant carcinoid tumor of the large intestine, unspecified portion: Secondary | ICD-10-CM

## 2013-08-26 DIAGNOSIS — D649 Anemia, unspecified: Secondary | ICD-10-CM

## 2013-08-26 DIAGNOSIS — C7B8 Other secondary neuroendocrine tumors: Secondary | ICD-10-CM

## 2013-08-26 LAB — GLUCOSE, CAPILLARY
GLUCOSE-CAPILLARY: 206 mg/dL — AB (ref 70–99)
Glucose-Capillary: 159 mg/dL — ABNORMAL HIGH (ref 70–99)
Glucose-Capillary: 162 mg/dL — ABNORMAL HIGH (ref 70–99)
Glucose-Capillary: 162 mg/dL — ABNORMAL HIGH (ref 70–99)
Glucose-Capillary: 174 mg/dL — ABNORMAL HIGH (ref 70–99)

## 2013-08-26 LAB — CBC
HEMATOCRIT: 32.5 % — AB (ref 36.0–46.0)
Hemoglobin: 10.2 g/dL — ABNORMAL LOW (ref 12.0–15.0)
MCH: 27 pg (ref 26.0–34.0)
MCHC: 31.4 g/dL (ref 30.0–36.0)
MCV: 86 fL (ref 78.0–100.0)
Platelets: 306 10*3/uL (ref 150–400)
RBC: 3.78 MIL/uL — ABNORMAL LOW (ref 3.87–5.11)
RDW: 13.7 % (ref 11.5–15.5)
WBC: 12.6 10*3/uL — ABNORMAL HIGH (ref 4.0–10.5)

## 2013-08-26 LAB — BASIC METABOLIC PANEL
Anion gap: 11 (ref 5–15)
BUN: 18 mg/dL (ref 6–23)
CALCIUM: 8.8 mg/dL (ref 8.4–10.5)
CO2: 24 mEq/L (ref 19–32)
CREATININE: 0.83 mg/dL (ref 0.50–1.10)
Chloride: 103 mEq/L (ref 96–112)
GFR calc Af Amer: 85 mL/min — ABNORMAL LOW (ref >90)
GFR calc non Af Amer: 73 mL/min — ABNORMAL LOW (ref >90)
Glucose, Bld: 165 mg/dL — ABNORMAL HIGH (ref 70–99)
Potassium: 3.6 mEq/L — ABNORMAL LOW (ref 3.7–5.3)
Sodium: 138 mEq/L (ref 137–147)

## 2013-08-26 MED ORDER — ATENOLOL 25 MG PO TABS
25.0000 mg | ORAL_TABLET | Freq: Every day | ORAL | Status: DC
  Administered 2013-08-27 – 2013-08-28 (×2): 25 mg via ORAL
  Filled 2013-08-26 (×2): qty 1

## 2013-08-26 NOTE — Progress Notes (Addendum)
Patient ID: Yesenia Lyons, female   DOB: 06/12/1950, 63 y.o.   MRN: [id number] Pathology still pending.  Will continue to follow and await pathology results for further recommendations.  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  No role for surgery.  We would recommend an oncology consultation for further recommendations.  We will sign off.

## 2013-08-26 NOTE — Progress Notes (Signed)
I spoke with her regarding her pathology.

## 2013-08-26 NOTE — Progress Notes (Signed)
Agree 

## 2013-08-26 NOTE — Progress Notes (Signed)
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  3-ARF: improving with IVF's - continue holding diuretics and ACE - follow Cr trend, currently trending down  4-HTN: holding diuretics and ACE given renal failure - will continue hydralazine for now - low sodium diet - Given bradycardia we'll decrease atenolol dose  5-CHF: chronic and compensated at this time, diastolic according to patient; but not echo on records - will follow 2-d echo - currently compensated - follow strict I's and O's and daily weights - no SOB or orthopnea currently  6- COPD - Continue nebulizer treatments as needed on a when necessary basis. Compensated no wheezing on exam.  7. Leukocytosis - Will cover with oral antibiotics cipro and flagyl as patient has lost IV access - Could be related to mesenteric mass listed above or stress reaction. Etiology uncertain at this juncture as such will continue antibiotic regimen currently.  Code Status: Full Family Communication: no family at bedside Disposition Plan: to be determined   Consultants:  IR  CCS  Procedures:  Liver biopsy (8/18)  Antibiotics:  Meropenem   HPI/Subjective: No new complaints. No acute issues reported overnight.  Objective: Filed Vitals:   08/26/13 1400  BP: 140/68  Pulse: 56  Temp: 98.2 F (36.8 C)  Resp: 18    Intake/Output Summary (Last 24 hours) at 08/26/13 1434 Last data filed at 08/26/13 1400  Gross per 24 hour  Intake   1080 ml  Output    250 ml  Net    830 ml   Filed Weights   08/24/13 0625 08/25/13 0553 08/26/13 0501   Weight: 146.24 kg (322 lb 6.4 oz) 146.603 kg (323 lb 3.2 oz) 147.9 kg (326 lb 1 oz)    Exam:   General:  AAOX3, no fever; comfortably currently  Cardiovascular: S1 and S2, no rubs or gallops  Respiratory: CTA bilaterally, no wheezes  Abdomen: soft, discomfort with palpation over right side, no rebound tenderness  Musculoskeletal: no cyanosis or clubbing   Data Reviewed: Basic Metabolic Panel:  Recent Labs Lab 08/23/13 1016 08/23/13 1827 08/24/13 0500 08/26/13 0110  NA 133*  --  135* 138  K 3.7  --  4.0 3.6*  CL 94*  --  99 103  CO2 21  --  24 24  GLUCOSE 208*  --  112* 165*  BUN 37*  --  33* 18  CREATININE 1.43* 1.39* 1.34* 0.83  CALCIUM 9.5  --  9.0 8.8   Liver Function Tests:  Recent Labs Lab 08/23/13 1016  AST 50*  ALT 17  ALKPHOS 264*  BILITOT 1.6*  PROT 8.6*  ALBUMIN 2.9*    Recent Labs Lab 08/23/13 1016  LIPASE 15   CBC:  Recent Labs Lab 08/23/13 1016 08/23/13 1827 08/24/13 0500 08/26/13 0110  WBC 21.9* 20.2* 17.1* 12.6*  NEUTROABS 18.6*  --   --   --   HGB 11.7* 11.2* 10.5* 10.2*  HCT 35.5* 35.3* 33.6* 32.5*  MCV 83.1 85.1 85.7 86.0  PLT 253 238 237 306   BNP (last 3 results)  Recent Labs  03/29/13 1340  PROBNP 399.1*   CBG:  Recent Labs Lab 08/25/13 1203 08/25/13 1713 08/25/13  2354 08/26/13 0600 08/26/13 1131  GLUCAP 150* 117* 162* 162* 206*    Recent Results (from the past 240 hour(s))  CULTURE, BLOOD (ROUTINE X 2)     Status: None   Collection Time    08/23/13  3:52 PM      Result Value Ref Range Status   Specimen Description BLOOD LEFT ANTECUBITAL   Final   Special Requests BOTTLES DRAWN AEROBIC AND ANAEROBIC 3 ML   Final   Culture  Setup Time     Final   Value: 08/23/2013 18:39     Performed at [facility]   Culture     Final   Value:        BLOOD CULTURE RECEIVED NO GROWTH TO DATE CULTURE WILL BE HELD FOR 5 DAYS BEFORE ISSUING A FINAL NEGATIVE REPORT     Performed at [facility]    Report Status PENDING   Incomplete  CULTURE, BLOOD (ROUTINE X 2)     Status: None   Collection Time    08/23/13  6:27 PM      Result Value Ref Range Status   Specimen Description BLOOD RIGHT ARM   Final   Special Requests BOTTLES DRAWN AEROBIC AND ANAEROBIC 6CC BOTH   Final   Culture  Setup Time     Final   Value: 08/23/2013 22:36     Performed at [facility]   Culture     Final   Value:        BLOOD CULTURE RECEIVED NO GROWTH TO DATE CULTURE WILL BE HELD FOR 5 DAYS BEFORE ISSUING A FINAL NEGATIVE REPORT     Performed at [facility]   Report Status PENDING   Incomplete     Studies: No results found.  Scheduled Meds: . atenolol  50 mg Oral Daily  . ciprofloxacin  500 mg Oral BID  . fluticasone  2 puff Inhalation BID  . gabapentin  400 mg Oral TID  . heparin  5,000 Units Subcutaneous 3 times per day  . insulin aspart  0-20 Units Subcutaneous Q6H  . metroNIDAZOLE  500 mg Oral 3 times per day  . sodium chloride  3 mL Intravenous Q12H   Continuous Infusions: . sodium chloride Stopped (08/25/13 1606)    Time spent:< 35 minutes   Velvet Bathe  [facility] Pager [phone] If 7PM-7AM, please contact night-coverage at [email or web address], password [facility] 08/26/2013, 2:34 PM  LOS: 3 days

## 2013-08-26 NOTE — Consult Note (Signed)
[place]  [phone]) [phone]   HEMATOLOGY ONCOLOGY CONSULTATION   Yesenia Lyons  DOB: Apr 21, 1950  MR#: [id number]  CSN#: [id number]    Requesting Physician: [facility]      Reason for Consult: Neuroendocrine Cancer of Gastrointestinal Origin  History of present illness: Yesenia Lyons is a  63 y.o. diabetic female who lives in [place], [place]. 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 In review, patient had been seen at the ED on 03/29/2013  with increasing abdominal pain and shortness of breath accompanied by weight loss and fevers. She also had early satiety, intermittent constipation, decreased appetite. Workup included a CT of the abdomen and pelvis without contrast on the same day, revealing a partially calcified mesenteric mass with innumerable metastatic lesions in the liver suspicious for  metastatic carcinoid tumor with extends of liver involvement.  Mesenteric Mass below 5.2 x 4.7 x 3.3 cm     Mesenteric mass with associated retroperitoneal/mesenteric/portahepatis/gastrohepatic adenopathy    Liver lesions Biggest on Right Lobe 5.1 cm seen below    Mass seen in vicinity of left adrenal gland below    She underwent a CT-guided core biopsy of the left adrenal gland which was essentially non -diagnostic ([id number]). The overall material was very limited and composed of rare atypical cells in a background of blood and fibrin.     She was discharged on 3/26 after symptoms were controlled with HHPT and RN, and was instructed to follow up as outpatient with PCP and GI specialty. She failed to do so. She was admitted on 08/23/2013 with progressive, similar symptoms described above, worsening over the last week, with dull,central abdominal pain, a 30 lb unintentional weight loss, nausea, anorexia, diarrhea, fatigue, weakness, low grade fevers and chills. She is able tolerate some liquids, but no real solid  food. She was short of breath, but no chest pain. She denied any GI or GU bleed. She had no confusion .No other symptoms are reported. A new CT scan on 8/18 showed again a mesenteric mass 4.6 x 3 cm (previously 4.7 x 3 cm) with multiple lesions in the liver, largest  4.9cm x 3.7 cm (stable), left adrenal mass 5.4 x 4.9 cm (previously 4.9 x 4.6 cm) Atelectasis in lung bases bilaterally, cardiomegaly, pelvic floor laxity.aortocaval lymph node 1.5 cm (1.4 cm before).  She also had acute renal failure, leukocytosis with WBC of 21.9, and mild anemia with a Hb of 11.7. Her bili was 1.6.        Biopsy of the liver lesion on 8/18 was positive for metastatic well differentiated neuroendocrine tumor (carcinoid)     CXR 08/23/2013 shows Cardiomegaly with mild interstitial prominence. Mild component of congestive heart failure may be present. Interstitial pneumonitis cannot be excluded. These findings are less prominent than on prior chest x-ray 03/29/2013.  Marland Kitchen   The biopsy reveals nests of cells with moderate cytoplasm and round nuclei with neuroendocrine chromatin.There is necrosis. Immunohistochemistry reveals the cells are positive for CD56,chromogranin,synaptophysin, and CDX-2. They are negative for TTF-1. These findings are consistent with a well differentiated neuroendocrine tumor (carcinoid) of gastrointestinal origin.([id number])    Patient has been evaluated by surgery Dr Zella Richer, concluding that patient is not a candidate for resection.  Based on these findings we were requested to see the patient in consultation regarding therapeutic options.  Past medical history:      Past Medical History  Diagnosis Date  . Pneumonia   . Renal  disorder     renal failure  . Obesity   . Hypertension   . Diabetes mellitus 2 1998  . High cholesterol   . CHF (congestive heart failure)     Past surgical history:      Past Surgical History  Procedure Laterality Date  . Abdominal hysterectomy with  oophorectomy secondary to fibroids in 1998    . Knee reconstruction, medial patellar femoral ligament      Medications:  Scheduled Meds: . [START ON 08/27/2013] atenolol  25 mg Oral Daily  . ciprofloxacin  500 mg Oral BID  . fluticasone  2 puff Inhalation BID  . gabapentin  400 mg Oral TID  . heparin  5,000 Units Subcutaneous 3 times per day  . insulin aspart  0-20 Units Subcutaneous Q6H  . metroNIDAZOLE  500 mg Oral 3 times per day  . sodium chloride  3 mL Intravenous Q12H   Continuous Infusions: . sodium chloride Stopped (08/25/13 1606)   PRN Meds:.acetaminophen, acetaminophen, guaiFENesin-dextromethorphan, HYDROcodone-acetaminophen, morphine injection, ondansetron (ZOFRAN) IV, ondansetron, polyethylene glycol Allergies:  Allergies  Allergen Reactions  . Penicillins Itching and Swelling    Family history:  Patient is adopted, family history is not known, except that she has 1 sister with breast cancer.   1 daughter, 1 grandson 50 years old.                             Social history:    Retired. Originally from [place], [place]. Retired. 1 daughter whom she lives now here in [facility]. Never smoked cigarettes. Denies history of ETOH or recreational drugs.   ROS: See HPI for significant positives, rest of the review of systems is negative.  Physical Exam    ECOG PERFORMANCE STATUS: 3  Filed Vitals:   08/26/13 1400  BP: 140/68  Pulse: 56  Temp: 98.2 F (36.8 C)  Resp: 18   Filed Weights   08/24/13 0625 08/25/13 0553 08/26/13 0501  Weight: 322 lb 6.4 oz (146.24 kg) 323 lb 3.2 oz (146.603 kg) 326 lb 1 oz (147.9 kg)    GENERAL:alert, no distress, alert and oriented x 3 SKIN: skin color, texture, turgor are normal, no rashes or significant lesions. No jaundice EYES: normal, conjunctiva are pink and non-injected, sclera clear Head: mild cushingoid appearance. OROPHARYNX:no exudate, no erythema and lips, buccal mucosa, and tongue normal  NECK: supple, thyroid normal size,  non-tender, without nodularity LYMPH:  no palpable lymphadenopathy in the cervical, axillary or inguinal LUNGS: clear to auscultation and percussion with normal breathing effort HEART: regular rate & rhythm and no murmurs and 1 + lower extremity edema ABDOMEN:Obese, soft, with  mild tenderness diffuse, especially at the right to central abdomen, bowel sounds present, no masses, hernias, or organomegaly are palpable,  A well healed hysterectomy scar seen. Musculoskeletal:no cyanosis of digits and no clubbing  PSYCH: alert & oriented x 3 with fluent speech NEURO: no focal motor/sensory deficits EXT: +2 pedal edema. PP+ b/l   Lab results:     CBC                         Studies:     Dg Chest 2 View  08/23/2013 COMPARISON:  03/29/2013.  10/29/2011.  FINDINGS: Mediastinum and hilar structures are normal. Mild cardiomegaly and pulmonary interstitial prominence. Mild component of congestive heart failure cannot be excluded. Mild pneumonitis cannot be excluded. No pleural effusion or pneumothorax. No  acute osseus abnormality.  IMPRESSION: Cardiomegaly with mild interstitial prominence. Mild component of congestive heart failure may be present. Interstitial pneumonitis cannot be excluded. These findings are less prominent than on prior chest x-ray 03/29/2013.   Electronically Signed   By: Marcello Moores  Register   On: 08/23/2013 17:05   US Abdomen Complete  08/23/2013   COMPARISON:  Ultrasound abdomen 03/31/2013, CT abdomen and pelvis 03/29/2013  FINDINGS: Gallbladder:  Normally distended without stones or wall thickening.  No pericholecystic fluid or sonographic Murphy sign.  Common bile duct:  Diameter: 4 mm diameter, normal  Liver:  Slightly heterogeneous echogenicity. No definite focal mass lesions. Hepatopetal portal venous flow.  IVC:  Normal appearance  Pancreas:  Tail incompletely visualized, visualized portions normal appearance  Spleen:  Normal appearance, 6.6 cm length  Right  Kidney:  Length: 10.3 cm.  Normal morphology without mass or hydronephrosis.  Left Kidney:  Length: 11.3 cm.  Normal morphology without mass or hydronephrosis.  Abdominal aorta:  Bifurcation obscured by bowel gas. Visualized portion normal caliber.  Other findings:  No free-fluid  IMPRESSION: Incomplete visualization of distal aorta and pancreatic tail.  Mildly inhomogeneous hepatic echogenicity without discrete mass.  No evidence of cholecystitis or cholelithiasis.   Electronically Signed   By: Lavonia Dana M.D.   On: 08/23/2013 12:26   Ct Abdomen Pelvis W Contrast  08/23/2013   COMPARISON:  Multiple exams, including 08/23/2013 and 03/29/2013  FINDINGS: Volume loss and hazy opacities in both lower lobes favoring atelectasis over aspiration pneumonitis. Cardiomegaly observed favoring the ventricles.  Abnormal heterogeneous lesions throughout the liver favoring diffuse hepatic metastatic disease. Index peripheral right hepatic lobe lesion 4.9 x 3.7 cm, difficult to measure on the prior exam due the lack of IV contrast but probably the same.  Spleen grossly unremarkable. Left adrenal mass 5.4 by 4.9 cm, previously 4.9 x 4.6 cm.  Mildly progressive porta hepatis sudden adenopathy. An aortocaval node measures 1.5 cm in short axis, previously 1.4 cm.  Mesenteric mass is again noted with some calcification. At the level with the calcification, the mass measures 4.6 x 3.0 cm. When I measure this mass in the same fashion on the prior exam, I get 4.7 x 3.0 cm.  Kidneys and proximal ureters unremarkable. Aside from a mesenteric mass, no pelvic adenopathy is observed.  There is pelvic floor laxity with the urinary bladder extending below the pubococcygeal line and the anorectal junction well below the pubococcygeal line.  IMPRESSION: 1. Partially calcified mesenteric mass with innumerable metastatic lesions in the liver. I am unable to find the pathology report from the prior adrenal biopsy in the EPIC system. The appearance  in the abdomen would be most compatible with metastatic carcinoid tumor with extends of liver involvement, although the adrenal involvement is mildly atypical. Conceivably this could be metastatic lung disease, lymphoma, or other gastrointestinal primary cancer. Correlate with biopsy results. 2. Atelectasis in both lung bases. 3. Cardiomegaly. 4. Pelvic floor laxity.   Electronically Signed   By: Sherryl Barters M.D.   On: 08/23/2013 14:32   Ct Biopsy  08/24/2013   CLINICAL DATA:  63 year old female with partially calcified mesenteric mass, numerous hepatic lesions and a left adrenal lesion. Overall, the imaging findings are concerning for metastatic carcinoid. The patient underwent percutaneous biopsy of the left adrenal mass on 03/31/2013. The biopsy demonstrated atypical cells but yielded insufficient material for diagnosis. Patient presents for attempted CT-guided biopsy of one of her hepatic lesions.  EXAM: CT BIOPSY  Date: 08/24/2013  PROCEDURE: 1. CT-guided fine-needle aspiration of segment 2 hepatic lesion 2. CT-guided core biopsy of segment 2 hepatic lesion Interventional Radiologist:  Criselda Peaches, MD  ANESTHESIA/SEDATION: Moderate (conscious) sedation was used. 21 mg Versed, 100 mcg Fentanyl were administered intravenously. The patient's vital signs were monitored continuously by radiology nursing throughout the procedure.  Sedation Time: 34 minutes  TECHNIQUE: Informed consent was obtained from the patient following explanation of the procedure, risks, benefits and alternatives. The patient understands, agrees and consents for the procedure. All questions were addressed. A time out was performed.  A planning axial CT scan was performed. The hypodense lesion in the posterior aspect of hepatic segment tube was identified. A suitable skin entry site was selected and marked. The region was then sterilely prepped and draped in the standard fashion using Betadine skin prep.  Local anesthesia was  attained by infiltration with 1% lidocaine. A small dermatotomy was made. Using intermittent CT guidance, a 17 gauge trocar needle was advanced into the left hepatic lobe and positioned at the margin of the mass. Given the patient's prior nondiagnostic biopsy, fine needle aspiration biopsies were initially obtained using a 20 gauge Francine needle. On-site cytopathologic evaluation confirmed diagnostic material. To facilitate additional immunohistochemical staining, several 18 gauge core biopsies were then coaxially obtained using the BioPince automated biopsy device.  As the 17 gauge trocar needle was removed, the biopsy tract was embolized with a Gel-Foam slurry. Post biopsy axial CT imaging demonstrates no hematoma or complication. The patient tolerated the procedure well.  COMPLICATIONS: None.  IMPRESSION: 1. Technically successful CT-guided fine needle aspiration of left hepatic lesion. 2. CT-guided core biopsy of left hepatic lesion. Signed,  Criselda Peaches, MD  [facility]  [facility]   Electronically Signed   By: Jacqulynn Cadet M.D.   On: 08/24/2013 14:14     Ct Abdomen Pelvis Wo Contrast  03/29/2013   COMPARISON: None. FINDINGS: Body habitus reduces diagnostic sensitivity and specificity. Subsegmental atelectasis and indistinct ground-glass opacities in both lower lobes. Mild cardiomegaly. Indistinct abnormal areas of hypodensity are present in the liver, including a potential 5.1 cm region of hypodensity in the right hepatic lobe on image 22 of series 2. The spleen, pancreas, and right adrenal gland appear normal. In the expected vicinity of the left adrenal gland, a 4.7 x 4.9 by 4.2 cm mildly hyperdense mass is present. Internal density 38 Hounsfield units. This is tangential to the upper pole of the left kidney. The kidneys appear otherwise unremarkable. Gastrohepatic ligament lymph node short axis diameter 1.4 cm. Suspected porta hepatis  adenopathy. Celiac trunk node 1.1 cm, image 32 of series 2. Left perirenal lymph node along the retroperitoneal, 1.4 cm in short axis, image 41 of series 2. Aortic caval lymph node 1.4 cm in short axis, image 42 of series 2. 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 Multiple loops of adjacent small bowel are present along the margin of this lesion. Trace free pelvic fluid eccentric to the right. Mild pelvic floor laxity with the anorectal junction well below the pubococcygeal line. Lower lumbar facet arthropathy. No renal calculi or hydronephrosis. No ureteral calculus observed. IMPRESSION: 1. Right lower quadrant mass with internal calcification and probable cicatricial response, confluent with central mesenteric adenopathy. Scattered retroperitoneal, mesenteric, porta hepatis, and gastrohepatic ligament adenopathy. Faint hypodensities in the  liver suspicious for liver masses. There is also a mass in the vicinity of the left adrenal gland measuring up to 4.9 cm. Top differential diagnostic considerations include the carcinoid tumor with adenopathy and hepatic metastatic disease; lymphoma; colon cancer ; or gastrointestinal stromal tumor. Consider correlation with markers such is 5-HIAA and chromogranin A. Tissue diagnosis suggested. 2. The left adrenal / suprarenal lesion is nonspecific and could represent confluent adenopathy, adrenal mass, or less likely exophytic renal cell carcinoma from the upper pole of the left kidney. 3. Ground-glass opacities in both lower lobes suggest edema. Mild cardiomegaly noted. There is also mild bibasilar subsegmental atelectasis. 4. Trace free pelvic fluid. Mild pelvic floor laxity. Electronically Signed By: Sherryl Barters M.D. On: 03/29/2013 17:24    US Abdomen Limited  03/31/2013   COMPARISON: CT ABD/PELV WO CM dated 03/29/2013 FINDINGS: Liver lesions cannot be identified. The liver is diffusely heterogeneous. IMPRESSION: Focal liver lesions could not be identified by ultrasound. Electronically Signed By: Maryclare Bean M.D. On: 03/31/2013 16:41   Ct Biopsy  03/31/2013 CLINICAL DATA: Left adrenal mass EXAM: CT-GUIDED BIOPSY OF A LEFT ADRENAL MASS. CORE. MEDICATIONS AND MEDICAL HISTORY: Versed two mg, Fentanyl 50 mcg. Additional Medications: None. ANESTHESIA/SEDATION: Moderate sedation time: 8 minutes PROCEDURE: The procedure, risks, benefits, and alternatives were explained to the patient. Questions regarding the procedure were encouraged and answered. The patient understands and consents to the procedure. The back was prepped with beta in a sterile fashion, and a sterile drape was applied covering the operative field. A sterile gown and sterile gloves were used for the procedure. Under CT guidance, a 17 gauge needle was inserted into the left adrenal mass. Five 18 gauge core biopsies were obtained. The guide needle was rim. Final imaging was performed. Patient tolerated the procedure well without complication. Vital sign monitoring by nursing staff during the procedure will continue as patient is in the special procedures unit for post procedure observation. FINDINGS: The images document guide needle placement within the left adrenal mass. Post biopsy images demonstrate no hemorrhage. IMPRESSION: Successful CT-guided core biopsy of a left adrenal mass. Electronically Signed By: Maryclare Bean M.D. On: 03/31/2013 16:55    Dg Chest Port 1 View  03/29/2013 COMPARISON: 10/29/2011 FINDINGS: Poor inspiration. Elevated right hemidiaphragm. Cardiomegaly. Pulmonary vascular congestion/ mild congestive heart failure. No segmental consolidation or mass identified. Evaluation of the right lung base limited by the elevated right hemidiaphragm. The patient would eventually benefit from two view chest. IMPRESSION:  Cardiomegaly. Pulmonary vascular congestion/mild congestive heart failure. Please see above. Electronically Signed By: Chauncey Cruel M.D. On: 03/29/2013 13:02    en Gross and Clinical Information  Assessment/Plan:63 y.o. female from [place]  1. 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  Metastasis includes the liver and  left adrenal gland  with porta hepatis adenopathy.   Looking at the clinical picture, radiologic findings and pathology, we are dealing with a slow growing carcinoid tumor which is positive for neuroendocrine markers. The primary tumor, right hepatic index lesion and adrenal lesion have not changed much in 4-5 months. I will recommend checking her serum Chromogranin level which can serve as a useful prognostic marker if elevated. We can also do a 24 hour urine 5-HIAA test which if elevated can be a useful marker too. I will start her treatment outpatient when I see her in clinic and initial therapy will be Octreotide (Sandostatin) LAR single agent therapy. Although it is advanced stage tumor, patients with  Observation alone or octreotide therapy can still survive for a long time. It also has a predilection to go to bones. It can also cause symptoms of flushing, diarrhea, vasomotor symptoms, fatigue, weight loss and right sided heart failure if it involves the Tricuspid valve.   2. Leukocytosis Likely reactive in the setting of tumor and inflammation. Patient on oral antibiotics as well with Cipro and Flagyl.   3. Anemia: Will recommend checking the iron studies, ferritin, copper,zinc,vitamin C level and LDH/citrate in am  3. Full Code  Other medical issues including DM2, Renal insufficiency, HTN  and COPD as per admitting team  [facility] E, PA-C 08/26/2013  Attending Note:  I have seen and examined this patient and agree with above note. I edited the above note and included labs and imaging studies in that.  This appears to be low grade Carcinoid disease of mesentery with liver and adrenal involvement and retroperitoneal adenopathy as well. We have measurable disease which we can follow on subsequent imaging. I would favor checking some serum and urine tests which I will order for tomorrow  AM and then follow her in outpatient oncology clinic and starting her systemic therapy with single agent Octreotide. For more aggressive tumors, we have other options also available like Xeloda, 5FU, folfox, folfiri regimens, Avastin, Afinitor, temodar+xeloda, option of liver directed therapy with microspheres like Yttrium. Radiotherapy or chemotherapy with streptozotocin, cisplatin, etoposide, and doxorubicin, either alone or in combination, has been used, and reports show some success; a good response occurs in only 20-30% of cases. ONE SHOULD ALSO BE CAREFUL IN PATIENTS LIKE THIS WHERE A SURGICAL PROCEDURE OR EVEN A BIOPSY CAN RESULT IN CARCINOID CRISIS and should be managed by preoperative use of octreotide bolus or octreotide infusion as per protocol.  Bernadene Bell, MD Medical Hematologist/Oncologist [place] Pager: [phone] Office No: [phone]

## 2013-08-27 LAB — FOLATE: FOLATE: 8.4 ng/mL

## 2013-08-27 LAB — GLUCOSE, CAPILLARY
GLUCOSE-CAPILLARY: 158 mg/dL — AB (ref 70–99)
Glucose-Capillary: 141 mg/dL — ABNORMAL HIGH (ref 70–99)
Glucose-Capillary: 127 mg/dL — ABNORMAL HIGH (ref 70–99)

## 2013-08-27 LAB — IRON AND TIBC
Iron: 21 ug/dL — ABNORMAL LOW (ref 42–135)
Saturation Ratios: 11 % — ABNORMAL LOW (ref 20–55)
TIBC: 190 ug/dL — AB (ref 250–470)
UIBC: 169 ug/dL (ref 125–400)

## 2013-08-27 LAB — LACTATE DEHYDROGENASE: LDH: 229 U/L (ref 94–250)

## 2013-08-27 LAB — SEDIMENTATION RATE: SED RATE: 123 mm/h — AB (ref 0–22)

## 2013-08-27 LAB — FERRITIN: Ferritin: 442 ng/mL — ABNORMAL HIGH (ref 10–291)

## 2013-08-27 NOTE — Plan of Care (Signed)
Problem: Phase III Progression Outcomes Goal: IV/normal saline lock discontinued Outcome: Not Applicable Date Met:  92/78/00 Pt has no IV access. MD aware.

## 2013-08-27 NOTE — Progress Notes (Signed)
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  - Oncology consulted for further evaluation and recommendations moving forward. Patient currently undergoing further workup.  2-diabetes type 2:  - continue SSI - A1C 7.7 - diabetic diet.  3-ARF: improving with IVF's - continue holding diuretics and ACE - On last check within normal limits  4-HTN: holding diuretics and ACE given renal failure - will continue hydralazine for now - low sodium diet - Given bradycardia we'll decrease atenolol dose  5-CHF: chronic and compensated at this time, diastolic according to patient; but not echo on records - will follow 2-d echo - currently compensated - follow strict I's and O's and daily weights - no SOB or orthopnea currently  6- COPD - Continue nebulizer treatments as needed on a when necessary basis. Compensated no wheezing on exam.  7. Leukocytosis - Leukocytosis most likely related to oncological process. Will discontinue antibiotic regimen  Code Status: Full Family Communication: no family at bedside Disposition Plan: Once workup completed by oncology we'll plan on discharging with plans for outpatient followup at oncologist clinic for further recommendations are   Consultants:  IR  CCS  Procedures:  Liver biopsy (8/18)  Antibiotics:  Meropenem   HPI/Subjective: No new complaints. No acute issues reported overnight.  Objective: Filed Vitals:   08/27/13 0452  BP: 158/74  Pulse: 52  Temp: 97.9 F (36.6 C)  Resp: 20    Intake/Output Summary (Last 24 hours) at 08/27/13 1331 Last data filed at 08/27/13 0900  Gross per 24 hour  Intake    660 ml  Output    300 ml  Net    360 ml   Filed Weights   08/25/13 0553  08/26/13 0501 08/27/13 0452  Weight: 146.603 kg (323 lb 3.2 oz) 147.9 kg (326 lb 1 oz) 148.7 kg (327 lb 13.2 oz)    Exam:   General:  AAOX3, no fever; comfortably currently  Cardiovascular: S1 and S2, no rubs or gallops  Respiratory: CTA bilaterally, no wheezes, no increased work of breathing, nasal cannula in place  Abdomen: soft, discomfort with palpation over right side, no rebound tenderness  Musculoskeletal: no cyanosis or clubbing   Data Reviewed: Basic Metabolic Panel:  Recent Labs Lab 08/23/13 1016 08/23/13 1827 08/24/13 0500 08/26/13 0110  NA 133*  --  135* 138  K 3.7  --  4.0 3.6*  CL 94*  --  99 103  CO2 21  --  24 24  GLUCOSE 208*  --  112* 165*  BUN 37*  --  33* 18  CREATININE 1.43* 1.39* 1.34* 0.83  CALCIUM 9.5  --  9.0 8.8   Liver Function Tests:  Recent Labs Lab 08/23/13 1016  AST 50*  ALT 17  ALKPHOS 264*  BILITOT 1.6*  PROT 8.6*  ALBUMIN 2.9*    Recent Labs Lab 08/23/13 1016  LIPASE 15   CBC:  Recent Labs Lab 08/23/13 1016 08/23/13 1827 08/24/13 0500 08/26/13 0110  WBC 21.9* 20.2* 17.1* 12.6*  NEUTROABS 18.6*  --   --   --   HGB 11.7* 11.2* 10.5* 10.2*  HCT 35.5* 35.3* 33.6* 32.5*  MCV 83.1 85.1 85.7 86.0  PLT 253 238 237 306   BNP (last 3 results)  Recent Labs  03/29/13 1340  PROBNP 399.1*   CBG:  Recent Labs Lab 08/26/13 0600 08/26/13 1131 08/26/13 1718 08/26/13 2345 08/27/13 0523  GLUCAP 162* 206* 174* 159* 141*    Recent Results (from the past 240 hour(s))  CULTURE, BLOOD (ROUTINE X 2)     Status: None   Collection Time    08/23/13  3:52 PM      Result Value Ref Range Status   Specimen Description BLOOD LEFT ANTECUBITAL   Final   Special Requests BOTTLES DRAWN AEROBIC AND ANAEROBIC 3 ML   Final   Culture  Setup Time     Final   Value: 08/23/2013 18:39     Performed at [facility]   Culture     Final   Value:        BLOOD CULTURE RECEIVED NO GROWTH TO DATE CULTURE WILL BE HELD FOR 5 DAYS  BEFORE ISSUING A FINAL NEGATIVE REPORT     Performed at [facility]   Report Status PENDING   Incomplete  CULTURE, BLOOD (ROUTINE X 2)     Status: None   Collection Time    08/23/13  6:27 PM      Result Value Ref Range Status   Specimen Description BLOOD RIGHT ARM   Final   Special Requests BOTTLES DRAWN AEROBIC AND ANAEROBIC 6CC BOTH   Final   Culture  Setup Time     Final   Value: 08/23/2013 22:36     Performed at [facility]   Culture     Final   Value:        BLOOD CULTURE RECEIVED NO GROWTH TO DATE CULTURE WILL BE HELD FOR 5 DAYS BEFORE ISSUING A FINAL NEGATIVE REPORT     Performed at [facility]   Report Status PENDING   Incomplete     Studies: No results found.  Scheduled Meds: . atenolol  25 mg Oral Daily  . fluticasone  2 puff Inhalation BID  . gabapentin  400 mg Oral TID  . heparin  5,000 Units Subcutaneous 3 times per day  . insulin aspart  0-20 Units Subcutaneous Q6H  . sodium chloride  3 mL Intravenous Q12H   Continuous Infusions: . sodium chloride Stopped (08/25/13 1606)    Time spent:< 35 minutes   Velvet Bathe  [facility] Pager [phone] If 7PM-7AM, please contact night-coverage at [email or web address], password [facility] 08/27/2013, 1:31 PM  LOS: 4 days

## 2013-08-28 LAB — GLUCOSE, CAPILLARY
GLUCOSE-CAPILLARY: 133 mg/dL — AB (ref 70–99)
GLUCOSE-CAPILLARY: 149 mg/dL — AB (ref 70–99)
Glucose-Capillary: 155 mg/dL — ABNORMAL HIGH (ref 70–99)

## 2013-08-28 MED ORDER — HYDROCODONE-ACETAMINOPHEN 5-325 MG PO TABS: 2.0000 | ORAL_TABLET | Freq: Four times a day (QID) | ORAL | Status: DC | PRN

## 2013-08-28 MED ORDER — ONDANSETRON 4 MG PO TBDP: 4.0000 mg | ORAL_TABLET | Freq: Three times a day (TID) | ORAL | Status: AC | PRN

## 2013-08-28 NOTE — Discharge Summary (Signed)
Physician Discharge Summary  Yesenia Lyons ONG:[id number] DOB: 11/29/1950 DOA: 08/23/2013  PCP: Secundino Ginger, PA-C  Admit date: 08/23/2013 Discharge date: 08/28/2013  Time spent: > 35 minutes  Recommendations for Outpatient Follow-up:  1. Pt will need to f/u with oncologist for further evaluation and recommendations after dc for newly diagnosed carcinoid tumor that is not surgically resectable  Discharge Diagnoses:  stable  Discharge Condition: stable  Diet recommendation: Carb modified diet.  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  - Oncology consulted and recommended the following: The biopsy reveals nests of cells with moderate cytoplasm and round nuclei with neuroendocrine chromatin.There is necrosis. Immunohistochemistry reveals the cells are positive for CD56,chromogranin,synaptophysin, and CDX-2. They are negative for TTF-1. 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  Metastasis includes the liver and left adrenal gland with porta hepatis adenopathy.  Looking at the clinical picture, radiologic findings and pathology, we are dealing with a slow growing carcinoid tumor which is positive for neuroendocrine markers. The primary tumor, right hepatic index lesion and adrenal lesion have not changed much in 4-5 months. I will recommend checking her serum Chromogranin level which can  serve as a useful prognostic marker if elevated. We can also do a 24 hour urine 5-HIAA test which if elevated can be a useful marker too. I will start her treatment outpatient when I see her in clinic and initial therapy will be Octreotide (Sandostatin) LAR single agent therapy. Although it is advanced stage tumor, patients with Observation alone or octreotide therapy can still survive for a long time. It also has a predilection to go to bones. It can also cause symptoms of flushing, diarrhea, vasomotor symptoms, fatigue, weight loss and right sided heart failure if it involves the Tricuspid valve.     Procedures:  As listed above  Consultations:  Oncology: Dr. Lona Kettle  Discharge Exam: Filed Vitals:   08/28/13 1357  BP: 140/78  Pulse: 51  Temp: 97.6 F (36.4 C)  Resp: 16    General: pt in nad, alert and awake Cardiovascular: rrr, no mrg Respiratory: cta bl, no wheezes  Discharge Instructions You were cared for by a hospitalist during your hospital stay. If you have any questions about your discharge medications or the care you received while you were in the hospital after you are discharged, you can call the unit and asked to speak with the hospitalist on call if the hospitalist that took care of you is not available. Once you are discharged, your primary care physician will handle any further medical issues. Please note that NO REFILLS for any discharge medications will be authorized once you are discharged, as it is imperative that you return to your primary care physician (or establish a relationship with a primary care physician if you do not have one) for your aftercare needs so that they can reassess your need for medications and monitor your lab values.  Discharge Instructions  Call MD for:  difficulty breathing, headache or visual disturbances    Complete by:  As directed      Call MD for:  persistant nausea and vomiting    Complete by:  As directed      Call MD for:  severe  uncontrolled pain    Complete by:  As directed      Call MD for:  temperature >100.4    Complete by:  As directed      Diet - low sodium heart healthy    Complete by:  As directed      Discharge instructions    Complete by:  As directed   Please be sure to follow up with your oncologist Dr. Lincoln Maxin  His office number is [phone] please call him to set up f/u appointment for further recommendations.     Increase activity slowly    Complete by:  As directed             Medication List         acetaminophen 650 MG CR tablet  Commonly known as:  TYLENOL  Take 650 mg by mouth every 8 (eight) hours as needed for pain.     albuterol 108 (90 BASE) MCG/ACT inhaler  Commonly known as:  PROVENTIL HFA;VENTOLIN HFA  Inhale 2 puffs into the lungs every 6 (six) hours as needed for wheezing.     amLODipine-benazepril 10-20 MG per capsule  Commonly known as:  LOTREL  Take 1 capsule by mouth daily.     atenolol 50 MG tablet  Commonly known as:  TENORMIN  Take 50 mg by mouth daily.     beclomethasone 80 MCG/ACT inhaler  Commonly known as:  QVAR  Inhale 1 puff into the lungs as needed (sob). breathing     CINNAMON PLUS CHROMIUM [phone] MCG-MG Caps  Generic drug:  Chromium-Cinnamon  Take 1 capsule by mouth daily.     feeding supplement (ENSURE COMPLETE) Liqd  Take 237 mLs by mouth 2 (two) times daily between meals.     ferrous gluconate 324 MG tablet  Commonly known as:  FERGON  Take 1 tablet (324 mg total) by mouth 2 (two) times daily with a meal.     furosemide 40 MG tablet  Commonly known as:  LASIX  Take 1 tablet (40 mg total) by mouth daily.     gabapentin 400 MG capsule  Commonly known as:  NEURONTIN  Take 400 mg by mouth 3 (three) times daily.     Ginger Root 550 MG Caps  Take 1 capsule by mouth daily.     HM CRANBERRY SUPER STRENGTH 300 MG tablet  Generic drug:  Cranberry  Take 300 mg by mouth 2 (two) times daily.     HYDROcodone-acetaminophen 5-325 MG per tablet   Commonly known as:  NORCO/VICODIN  Take 2 tablets by mouth every 6 (six) hours as needed for moderate pain.     insulin aspart 100 UNIT/ML injection  Commonly known as:  novoLOG  Inject 40-50 Units into the skin 3 (three) times daily before meals. Inject 50 units at breakfast and 40 units at  lunch and 40 units at dinner. Pt on sliding scale     ondansetron 4 MG disintegrating tablet  Commonly known as:  ZOFRAN ODT  Take 1 tablet (4 mg total) by mouth every 8 (eight) hours as needed for nausea or vomiting.     spironolactone 25 MG tablet  Commonly known as:  ALDACTONE  Take 25 mg by mouth daily.       Allergies  Allergen Reactions  . Penicillins Itching and Swelling      The results of significant diagnostics from this hospitalization (including imaging, microbiology, ancillary and laboratory) are listed below for reference.    Significant Diagnostic Studies: Dg Chest 2 View  08/23/2013   CLINICAL DATA:  Cough and congestion.  Shortness of breath.  EXAM: CHEST  2 VIEW  COMPARISON:  03/29/2013.  10/29/2011.  FINDINGS: Mediastinum and hilar structures are normal. Mild cardiomegaly and pulmonary interstitial prominence. Mild component of congestive heart failure cannot be excluded. Mild pneumonitis cannot be excluded. No pleural effusion or pneumothorax. No acute osseus abnormality.  IMPRESSION: Cardiomegaly with mild interstitial prominence. Mild component of congestive heart failure may be present. Interstitial pneumonitis cannot be excluded. These findings are less prominent than on prior chest x-ray 03/29/2013.   Electronically Signed   By: Marcello Moores  Register   On: 08/23/2013 17:05   US Abdomen Complete  08/23/2013   CLINICAL DATA:  Abdominal pain for 1 week, vomiting, question cholecystitis, history hypertension  EXAM: ULTRASOUND ABDOMEN COMPLETE  COMPARISON:  Ultrasound abdomen 03/31/2013, CT abdomen and pelvis 03/29/2013  FINDINGS: Gallbladder:  Normally distended without stones or  wall thickening.  No pericholecystic fluid or sonographic Murphy sign.  Common bile duct:  Diameter: 4 mm diameter, normal  Liver:  Slightly heterogeneous echogenicity. No definite focal mass lesions. Hepatopetal portal venous flow.  IVC:  Normal appearance  Pancreas:  Tail incompletely visualized, visualized portions normal appearance  Spleen:  Normal appearance, 6.6 cm length  Right Kidney:  Length: 10.3 cm.  Normal morphology without mass or hydronephrosis.  Left Kidney:  Length: 11.3 cm.  Normal morphology without mass or hydronephrosis.  Abdominal aorta:  Bifurcation obscured by bowel gas. Visualized portion normal caliber.  Other findings:  No free-fluid  IMPRESSION: Incomplete visualization of distal aorta and pancreatic tail.  Mildly inhomogeneous hepatic echogenicity without discrete mass.  No evidence of cholecystitis or cholelithiasis.   Electronically Signed   By: Lavonia Dana M.D.   On: 08/23/2013 12:26   Ct Abdomen Pelvis W Contrast  08/23/2013   CLINICAL DATA:  Mid abdominal pain.  Vomiting.  EXAM: CT ABDOMEN AND PELVIS WITH CONTRAST  TECHNIQUE: Multidetector CT imaging of the abdomen and pelvis was performed using the standard protocol following bolus administration of intravenous contrast.  CONTRAST:  12mL OMNIPAQUE IOHEXOL 300 MG/ML SOLN, 12mL OMNIPAQUE IOHEXOL 300 MG/ML SOLN  COMPARISON:  Multiple exams, including 08/23/2013 and 03/29/2013  FINDINGS: Volume loss and hazy opacities in both lower lobes favoring atelectasis over aspiration pneumonitis. Cardiomegaly observed favoring the ventricles.  Abnormal heterogeneous lesions throughout the liver favoring diffuse hepatic metastatic disease. Index peripheral right hepatic lobe lesion 4.9 x 3.7 cm, difficult to measure on the prior exam due the lack of IV contrast but probably the same.  Spleen grossly unremarkable. Left adrenal mass 5.4 by 4.9 cm, previously 4.9 x 4.6 cm.  Mildly progressive porta hepatis sudden adenopathy. An aortocaval node  measures 1.5 cm in short axis, previously 1.4 cm.  Mesenteric mass is again noted with some calcification. At the level with the calcification, the mass measures 4.6 x 3.0 cm. When I measure this mass in the same fashion on the prior exam, I get 4.7 x 3.0 cm.  Kidneys and proximal ureters unremarkable. Aside from a mesenteric mass, no pelvic adenopathy is observed.  There is pelvic floor laxity with the urinary bladder extending below the pubococcygeal  line and the anorectal junction well below the pubococcygeal line.  IMPRESSION: 1. Partially calcified mesenteric mass with innumerable metastatic lesions in the liver. I am unable to find the pathology report from the prior adrenal biopsy in the EPIC system. 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 Conceivably this could be metastatic lung disease, lymphoma, or other gastrointestinal primary cancer. Correlate with biopsy results. 2. Atelectasis in both lung bases. 3. Cardiomegaly. 4. Pelvic floor laxity.   Electronically Signed   By: Sherryl Barters M.D.   On: 08/23/2013 14:32   Ct Biopsy  08/24/2013   CLINICAL DATA:  63 year old female with partially calcified mesenteric mass, numerous hepatic lesions and a left adrenal lesion. Overall, the imaging findings are concerning for metastatic carcinoid. The patient underwent percutaneous biopsy of the left adrenal mass on 03/31/2013. The biopsy demonstrated atypical cells but yielded insufficient material for diagnosis. Patient presents for attempted CT-guided biopsy of one of her hepatic lesions.  EXAM: CT BIOPSY  Date: 08/24/2013  PROCEDURE: 1. CT-guided fine-needle aspiration of segment 2 hepatic lesion 2. CT-guided core biopsy of segment 2 hepatic lesion Interventional Radiologist:  Criselda Peaches, MD  ANESTHESIA/SEDATION: Moderate (conscious) sedation was used. 21 mg Versed,  100 mcg Fentanyl were administered intravenously. The patient's vital signs were monitored continuously by radiology nursing throughout the procedure.  Sedation Time: 34 minutes  TECHNIQUE: Informed consent was obtained from the patient following explanation of the procedure, risks, benefits and alternatives. The patient understands, agrees and consents for the procedure. All questions were addressed. A time out was performed.  A planning axial CT scan was performed. The hypodense lesion in the posterior aspect of hepatic segment tube was identified. A suitable skin entry site was selected and marked. The region was then sterilely prepped and draped in the standard fashion using Betadine skin prep.  Local anesthesia was attained by infiltration with 1% lidocaine. A small dermatotomy was made. Using intermittent CT guidance, a 17 gauge trocar needle was advanced into the left hepatic lobe and positioned at the margin of the mass. Given the patient's prior nondiagnostic biopsy, fine needle aspiration biopsies were initially obtained using a 20 gauge Francine needle. On-site cytopathologic evaluation confirmed diagnostic material. To facilitate additional immunohistochemical staining, several 18 gauge core biopsies were then coaxially obtained using the BioPince automated biopsy device.  As the 17 gauge trocar needle was removed, the biopsy tract was embolized with a Gel-Foam slurry. Post biopsy axial CT imaging demonstrates no hematoma or complication. The patient tolerated the procedure well.  COMPLICATIONS: None.  IMPRESSION: 1. Technically successful CT-guided fine needle aspiration of left hepatic lesion. 2. CT-guided core biopsy of left hepatic lesion. Signed,  Criselda Peaches, MD  [facility]  [facility]   Electronically Signed   By: Jacqulynn Cadet M.D.   On: 08/24/2013 14:14    Microbiology: Recent Results (from the past 240 hour(s))  CULTURE, BLOOD  (ROUTINE X 2)     Status: None   Collection Time    08/23/13  3:52 PM      Result Value Ref Range Status   Specimen Description BLOOD LEFT ANTECUBITAL   Final   Special Requests BOTTLES DRAWN AEROBIC AND ANAEROBIC 3 ML   Final   Culture  Setup Time     Final   Value: 08/23/2013 18:39     Performed at [facility]  Culture     Final   Value:        BLOOD CULTURE RECEIVED NO GROWTH TO DATE CULTURE WILL BE HELD FOR 5 DAYS BEFORE ISSUING A FINAL NEGATIVE REPORT     Performed at [facility]   Report Status PENDING   Incomplete  CULTURE, BLOOD (ROUTINE X 2)     Status: None   Collection Time    08/23/13  6:27 PM      Result Value Ref Range Status   Specimen Description BLOOD RIGHT ARM   Final   Special Requests BOTTLES DRAWN AEROBIC AND ANAEROBIC 6CC BOTH   Final   Culture  Setup Time     Final   Value: 08/23/2013 22:36     Performed at [facility]   Culture     Final   Value:        BLOOD CULTURE RECEIVED NO GROWTH TO DATE CULTURE WILL BE HELD FOR 5 DAYS BEFORE ISSUING A FINAL NEGATIVE REPORT     Performed at [facility]   Report Status PENDING   Incomplete     Labs: Basic Metabolic Panel:  Recent Labs Lab 08/23/13 1016 08/23/13 1827 08/24/13 0500 08/26/13 0110  NA 133*  --  135* 138  K 3.7  --  4.0 3.6*  CL 94*  --  99 103  CO2 21  --  24 24  GLUCOSE 208*  --  112* 165*  BUN 37*  --  33* 18  CREATININE 1.43* 1.39* 1.34* 0.83  CALCIUM 9.5  --  9.0 8.8   Liver Function Tests:  Recent Labs Lab 08/23/13 1016  AST 50*  ALT 17  ALKPHOS 264*  BILITOT 1.6*  PROT 8.6*  ALBUMIN 2.9*    Recent Labs Lab 08/23/13 1016  LIPASE 15   No results found for this basename: AMMONIA,  in the last 168 hours CBC:  Recent Labs Lab 08/23/13 1016 08/23/13 1827 08/24/13 0500 08/26/13 0110  WBC 21.9* 20.2* 17.1* 12.6*  NEUTROABS 18.6*  --   --   --   HGB 11.7* 11.2* 10.5* 10.2*  HCT 35.5* 35.3* 33.6* 32.5*  MCV 83.1 85.1 85.7 86.0  PLT  253 238 237 306   Cardiac Enzymes: No results found for this basename: CKTOTAL, CKMB, CKMBINDEX, TROPONINI,  in the last 168 hours BNP: BNP (last 3 results)  Recent Labs  03/29/13 1340  PROBNP 399.1*   CBG:  Recent Labs Lab 08/27/13 1143 08/27/13 1817 08/28/13 0011 08/28/13 0609 08/28/13 1120  GLUCAP 158* 127* 155* 133* 149*       Signed:  Velvet Bathe  [facility] 08/28/2013, 3:13 PM

## 2013-08-29 LAB — CULTURE, BLOOD (ROUTINE X 2)
CULTURE: NO GROWTH
Culture: NO GROWTH

## 2013-08-30 ENCOUNTER — Telehealth: Payer: Self-pay | Admitting: Hematology

## 2013-08-30 LAB — ZINC: Zinc: 51 ug/dL — ABNORMAL LOW (ref 60–130)

## 2013-08-30 LAB — COPPER, SERUM: COPPER: 164 ug/dL (ref 70–175)

## 2013-08-30 NOTE — Telephone Encounter (Signed)
S/W PATIENT DTR AND GAVE HOSP F/U APPT FOR 08/27 @ 1 W/DR. SEHBAI.

## 2013-08-30 NOTE — Telephone Encounter (Signed)
C/D 08/30/13 for appt. 09/02/13

## 2013-08-31 LAB — CHROMOGRANIN A: CHROMOGRANIN A: 33 ng/mL — AB (ref <=15)

## 2013-09-02 ENCOUNTER — Other Ambulatory Visit: Payer: Self-pay | Admitting: *Deleted

## 2013-09-02 ENCOUNTER — Ambulatory Visit (HOSPITAL_BASED_OUTPATIENT_CLINIC_OR_DEPARTMENT_OTHER): Payer: Medicare (Managed Care)

## 2013-09-02 ENCOUNTER — Ambulatory Visit (HOSPITAL_BASED_OUTPATIENT_CLINIC_OR_DEPARTMENT_OTHER): Payer: Medicare (Managed Care) | Admitting: Hematology

## 2013-09-02 ENCOUNTER — Telehealth: Payer: Self-pay | Admitting: Hematology

## 2013-09-02 ENCOUNTER — Telehealth: Payer: Self-pay | Admitting: *Deleted

## 2013-09-02 VITALS — BP 157/69 | HR 52 | Temp 98.9°F | Resp 18 | Ht 70.0 in | Wt 333.0 lb

## 2013-09-02 DIAGNOSIS — R634 Abnormal weight loss: Secondary | ICD-10-CM

## 2013-09-02 DIAGNOSIS — L293 Anogenital pruritus, unspecified: Secondary | ICD-10-CM

## 2013-09-02 DIAGNOSIS — E119 Type 2 diabetes mellitus without complications: Secondary | ICD-10-CM

## 2013-09-02 DIAGNOSIS — D509 Iron deficiency anemia, unspecified: Secondary | ICD-10-CM

## 2013-09-02 DIAGNOSIS — C787 Secondary malignant neoplasm of liver and intrahepatic bile duct: Secondary | ICD-10-CM

## 2013-09-02 DIAGNOSIS — D3A098 Benign carcinoid tumors of other sites: Secondary | ICD-10-CM

## 2013-09-02 DIAGNOSIS — C7B8 Other secondary neuroendocrine tumors: Secondary | ICD-10-CM

## 2013-09-02 DIAGNOSIS — C7A00 Malignant carcinoid tumor of unspecified site: Secondary | ICD-10-CM

## 2013-09-02 DIAGNOSIS — E618 Deficiency of other specified nutrient elements: Secondary | ICD-10-CM

## 2013-09-02 MED ORDER — VITAMIN C 500 MG PO TABS: 500.0000 mg | ORAL_TABLET | Freq: Every day | ORAL | Status: AC

## 2013-09-02 MED ORDER — FLUCONAZOLE 200 MG PO TABS: ORAL_TABLET | ORAL | Status: DC

## 2013-09-02 MED ORDER — GLUCERNA SHAKE PO LIQD: 237.0000 mL | Freq: Two times a day (BID) | ORAL | Status: DC

## 2013-09-02 MED ORDER — ZINC 50 MG PO TABS: 50.0000 mg | ORAL_TABLET | Freq: Every day | ORAL | Status: DC

## 2013-09-02 NOTE — Telephone Encounter (Signed)
per pof to sch appt-sch & gave pt copy of sch

## 2013-09-02 NOTE — Telephone Encounter (Signed)
Per staff message and POF I have scheduled appts. Advised scheduler of appts. JMW  

## 2013-09-03 LAB — 5 HIAA, QUANTITATIVE, URINE, 24 HOUR
5-HIAA, 24 Hr Urine: 29.9 mg/24 h — ABNORMAL HIGH (ref <=6.0)
Volume, Urine-5HIAA: 600 mL/24 h

## 2013-09-06 ENCOUNTER — Encounter: Payer: Self-pay | Admitting: Hematology

## 2013-09-06 ENCOUNTER — Telehealth: Payer: Self-pay | Admitting: *Deleted

## 2013-09-06 DIAGNOSIS — C7A00 Malignant carcinoid tumor of unspecified site: Secondary | ICD-10-CM | POA: Insufficient documentation

## 2013-09-06 NOTE — Telephone Encounter (Signed)
Per daughters request, I have moved appts to earlier in the day

## 2013-09-06 NOTE — Progress Notes (Signed)
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  HISTORY OF PRESENTING ILLNESS:   Ms Yesenia Lyons is a 63 y.o. diabetic female who lives in [place], [place]. 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 In review, patient had been seen at the ED on 03/29/2013 with increasing abdominal pain and shortness of breath accompanied by weight loss and fevers. She also had early satiety, intermittent constipation, decreased appetite. Workup included a CT of the abdomen and pelvis without contrast on the same day, revealing a partially calcified mesenteric mass with innumerable metastatic lesions in the liver suspicious for metastatic carcinoid tumor with extends of liver involvement.   Mesenteric Mass below 5.2 x 4.7 x 3.3 cm    Mesenteric mass with associated retroperitoneal/mesenteric/portahepatis/gastrohepatic adenopathy    Liver lesions Biggest on Right Lobe 5.1 cm seen below    Mass seen in vicinity of left adrenal gland below    She underwent a CT-guided core biopsy of the left adrenal gland which was essentially non -diagnostic ([id number]). The overall material was very limited and composed of rare atypical cells in a background of blood and fibrin.   She was discharged on 3/26 after symptoms were controlled with HHPT and RN, and was instructed to follow up as outpatient with PCP and GI specialty. She failed to do so. She was admitted on 08/23/2013 with progressive, similar symptoms described above, worsening over the last week, with dull,central abdominal pain, a 30 lb unintentional weight loss, nausea, anorexia, diarrhea, fatigue, weakness, low grade fevers and chills. She is able tolerate some liquids, but no real solid food.  She was short of breath, but no chest pain. She denied any GI or GU bleed. She had no confusion .No other symptoms are reported. A new CT scan on 8/18 showed again a mesenteric mass 4.6 x 3 cm (previously 4.7 x 3 cm) with multiple lesions in the liver, largest 4.9cm x 3.7 cm (stable), left adrenal mass 5.4 x 4.9 cm (previously 4.9 x 4.6 cm) Atelectasis in lung bases bilaterally, cardiomegaly, pelvic floor laxity.aortocaval lymph node 1.5 cm (1.4 cm before). She also had acute renal failure, leukocytosis with WBC of 21.9, and mild anemia with a Hb of 11.7. Her bili was 1.6.    Biopsy of the liver lesion on 8/18 was positive for metastatic well differentiated neuroendocrine tumor (carcinoid)    CXR 08/23/2013 shows Cardiomegaly with mild interstitial prominence. Mild component of congestive heart failure may be present. Interstitial pneumonitis cannot be excluded. These findings are less prominent than on prior chest x-ray 03/29/2013.  Marland Kitchen  The biopsy reveals nests of cells with moderate cytoplasm and round nuclei with neuroendocrine chromatin.There is necrosis. Immunohistochemistry reveals the cells are positive for CD56,chromogranin,synaptophysin, and CDX-2. They are negative for TTF-1. These findings are consistent with a well differentiated neuroendocrine tumor (carcinoid) of gastrointestinal origin.([id number])   Patient has been evaluated by surgery Dr Zella Richer, concluding that patient is not a candidate for resection. Based on these findings we were requested to see the patient in consultation regarding therapeutic options.   This appears to be low grade Carcinoid disease of mesentery with liver and adrenal involvement and retroperitoneal adenopathy as well. We have measurable disease which we can follow on subsequent imaging.  Patient today did  c/o some vaginal irritation and itching like seen with yeast infections.   A 24-HOUR QUANTITATIVE 5 HIAA IN URINE was 29.9 (h) normal <=6.0 mg/24 h  volume of urine 600 ml/24 hrs. Serum LDH was 229 normal. Copper 164 mcg normal, zinc low at 51 mcg, Ferritin 442 high marker of inflammation, serum iron low at 21 microgram, TIBC 190, FOLATE 8.4, B12 507 pg normal, CHROMOGRANIN A 33 elevated (normal <15 ng), ESR 123 high, ca19-9 23 normal, CEA 1.6 normal, CA125 25 normal.  I reviewed her records extensively and collaborated the history with the patient.   MEDICAL HISTORY:  Past Medical History  Diagnosis Date  . Pneumonia   . Renal disorder     renal failure  . Obesity   . Hypertension   . Diabetes mellitus   . High cholesterol   . CHF (congestive heart failure)     SURGICAL HISTORY: Past Surgical History  Procedure Laterality Date  . Abdominal hysterectomy    . Knee reconstruction, medial patellar femoral ligament      SOCIAL HISTORY: History   Social History  . Marital Status: Single    Spouse Name: N/A    Number of Children: N/A  . Years of Education: N/A   Occupational History  . Not on file.   Social History Main Topics  . Smoking status: Never Smoker   . Smokeless tobacco: Never Used  . Alcohol Use: No  . Drug Use: No  . Sexual Activity: No   Other Topics Concern  . Not on file   Social History Narrative  . No narrative on file   Retired. Originally from [place], [place]. Retired. 1 daughter whom she lives now here in [facility]. Never smoked cigarettes. Denies history of ETOH or recreational drugs.   FAMILY HISTORY:  Patient is adopted, family history is not known, except that she has 1 sister with breast cancer. 1 daughter, 1 grandson 67 years old.   ALLERGIES:  is allergic to penicillins. It causes itching and swelling.  MEDICATIONS:  Current Outpatient Prescriptions  Medication Sig Dispense Refill  . acetaminophen (TYLENOL) 650 MG CR tablet Take 650 mg by mouth every 8 (eight) hours as needed for pain.      Marland Kitchen albuterol (PROVENTIL HFA;VENTOLIN HFA) 108 (90 BASE) MCG/ACT inhaler Inhale 2 puffs into  the lungs every 6 (six) hours as needed for wheezing.       Marland Kitchen amLODipine-benazepril (LOTREL) 10-20 MG per capsule Take 1 capsule by mouth daily.      Marland Kitchen atenolol (TENORMIN) 50 MG tablet Take 50 mg by mouth daily.      . beclomethasone (QVAR) 80 MCG/ACT inhaler Inhale 1 puff into the lungs as needed (sob). breathing      . Chromium-Cinnamon (CINNAMON PLUS CHROMIUM) [phone] MCG-MG CAPS Take 1 capsule by mouth daily.      . Cranberry (HM CRANBERRY SUPER STRENGTH) 300 MG tablet Take 300 mg by mouth 2 (two) times daily.      . feeding supplement, ENSURE COMPLETE, (ENSURE COMPLETE) LIQD Take 237 mLs by mouth 2 (two) times daily between meals.  30 Bottle  0  . ferrous gluconate (FERGON) 324 MG tablet Take 1 tablet (324 mg total) by mouth 2 (two) times daily with a meal.  60 tablet  3  . furosemide (LASIX) 40 MG tablet Take 1 tablet (40 mg total) by mouth daily.  10 tablet  0  . gabapentin (NEURONTIN) 400 MG capsule Take 400 mg by mouth 3 (three) times  daily.      . Ginger, Zingiber officinalis, (GINGER ROOT) 550 MG CAPS Take 1 capsule by mouth daily.      Marland Kitchen HYDROcodone-acetaminophen (NORCO/VICODIN) 5-325 MG per tablet Take 2 tablets by mouth every 6 (six) hours as needed for moderate pain.  30 tablet  0  . insulin aspart (NOVOLOG) 100 UNIT/ML injection Inject 40-50 Units into the skin 3 (three) times daily before meals. Inject 50 units at breakfast and 40 units at  lunch and 40 units at dinner. Pt on sliding scale      . ondansetron (ZOFRAN ODT) 4 MG disintegrating tablet Take 1 tablet (4 mg total) by mouth every 8 (eight) hours as needed for nausea or vomiting.  20 tablet  0  . spironolactone (ALDACTONE) 25 MG tablet Take 25 mg by mouth daily.      . feeding supplement, GLUCERNA SHAKE, (GLUCERNA SHAKE) LIQD Take 237 mLs by mouth 2 (two) times daily between meals.  30 Can  3  . fluconazole (DIFLUCAN) 200 MG tablet Take 1 tablet by mouth daily for 5 days.  5 tablet  0  . vitamin C (ASCORBIC ACID) 500 MG  tablet Take 1 tablet (500 mg total) by mouth daily.  30 tablet  5  . Zinc 50 MG TABS Take 1 tablet (50 mg total) by mouth daily.  30 tablet  5   No current facility-administered medications for this visit.    Review of Systems  Constitutional: Positive for malaise/fatigue. Negative for fever, chills, weight loss and diaphoresis.  HENT: Negative for congestion, ear pain, hearing loss, nosebleeds and sore throat.   Eyes: Negative for blurred vision, double vision and pain.  Respiratory: Positive for cough and shortness of breath. Negative for hemoptysis, sputum production and wheezing.   Cardiovascular: Negative for chest pain, palpitations, orthopnea, claudication, leg swelling and PND.  Gastrointestinal: Positive for diarrhea. Negative for heartburn, nausea, vomiting, abdominal pain, constipation and blood in stool.  Genitourinary: Negative for dysuria and frequency.  Musculoskeletal: Negative for back pain, joint pain, myalgias and neck pain.  Skin: Negative for itching and rash.  Neurological: Positive for weakness. Negative for dizziness, sensory change, focal weakness and headaches.  Endo/Heme/Allergies: Does not bruise/bleed easily.  Psychiatric/Behavioral: Negative for depression and memory loss. The patient is not nervous/anxious and does not have insomnia.     PHYSICAL EXAMINATION: ECOG PERFORMANCE STATUS: 1, KPS 80  Filed Vitals:   09/02/13 1326  BP: 157/69  Pulse: 52  Temp: 98.9 F (37.2 C)  Resp: 18   Filed Weights   09/02/13 1326  Weight: 333 lb (151.048 kg)    Physical Exam  Constitutional: She is oriented to person, place, and time. She appears well-developed and well-nourished.  HENT:  Head: Normocephalic and atraumatic.  Mouth/Throat: Oropharynx is clear and moist.  Eyes: Conjunctivae and EOM are normal. Pupils are equal, round, and reactive to light.  Neck: Normal range of motion. Neck supple.  Cardiovascular: Normal rate, regular rhythm and normal heart  sounds.   Pulmonary/Chest: Effort normal and breath sounds normal. She has no wheezes. She has no rales.  Abdominal: Soft. Bowel sounds are normal. She exhibits no mass. There is no tenderness.  Musculoskeletal: Normal range of motion. She exhibits no edema.  Neurological: She is alert and oriented to person, place, and time. She has normal reflexes.  Skin: Skin is warm and dry.  Psychiatric: She has a normal mood and affect. Her behavior is normal. Judgment and thought content normal.  LABORATORY DATA:  I have reviewed the data as listed Lab Results  Component Value Date   WBC 12.6* 08/26/2013   HGB 10.2* 08/26/2013   HCT 32.5* 08/26/2013   MCV 86.0 08/26/2013   PLT 306 08/26/2013   Lab Results  Component Value Date   NA 138 08/26/2013   K 3.6* 08/26/2013   CL 103 08/26/2013   CO2 24 08/26/2013    RADIOGRAPHIC STUDIES:  I have personally reviewed the radiological images as listed and agreed with the findings in the report. Dg Chest 2 View  08/23/2013   CLINICAL DATA:  Cough and congestion.  Shortness of breath.  EXAM: CHEST  2 VIEW  COMPARISON:  03/29/2013.  10/29/2011.  FINDINGS: Mediastinum and hilar structures are normal. Mild cardiomegaly and pulmonary interstitial prominence. Mild component of congestive heart failure cannot be excluded. Mild pneumonitis cannot be excluded. No pleural effusion or pneumothorax. No acute osseus abnormality.  IMPRESSION: Cardiomegaly with mild interstitial prominence. Mild component of congestive heart failure may be present. Interstitial pneumonitis cannot be excluded. These findings are less prominent than on prior chest x-ray 03/29/2013.   Electronically Signed   By: Marcello Moores  Register   On: 08/23/2013 17:05   US Abdomen Complete  08/23/2013   CLINICAL DATA:  Abdominal pain for 1 week, vomiting, question cholecystitis, history hypertension  EXAM: ULTRASOUND ABDOMEN COMPLETE  COMPARISON:  Ultrasound abdomen 03/31/2013, CT abdomen and pelvis 03/29/2013   FINDINGS: Gallbladder:  Normally distended without stones or wall thickening.  No pericholecystic fluid or sonographic Murphy sign.  Common bile duct:  Diameter: 4 mm diameter, normal  Liver:  Slightly heterogeneous echogenicity. No definite focal mass lesions. Hepatopetal portal venous flow.  IVC:  Normal appearance  Pancreas:  Tail incompletely visualized, visualized portions normal appearance  Spleen:  Normal appearance, 6.6 cm length  Right Kidney:  Length: 10.3 cm.  Normal morphology without mass or hydronephrosis.  Left Kidney:  Length: 11.3 cm.  Normal morphology without mass or hydronephrosis.  Abdominal aorta:  Bifurcation obscured by bowel gas. Visualized portion normal caliber.  Other findings:  No free-fluid  IMPRESSION: Incomplete visualization of distal aorta and pancreatic tail.  Mildly inhomogeneous hepatic echogenicity without discrete mass.  No evidence of cholecystitis or cholelithiasis.   Electronically Signed   By: Lavonia Dana M.D.   On: 08/23/2013 12:26   Ct Abdomen Pelvis W Contrast  08/23/2013   CLINICAL DATA:  Mid abdominal pain.  Vomiting.  EXAM: CT ABDOMEN AND PELVIS WITH CONTRAST  TECHNIQUE: Multidetector CT imaging of the abdomen and pelvis was performed using the standard protocol following bolus administration of intravenous contrast.  CONTRAST:  75mL OMNIPAQUE IOHEXOL 300 MG/ML SOLN, 159mL OMNIPAQUE IOHEXOL 300 MG/ML SOLN  COMPARISON:  Multiple exams, including 08/23/2013 and 03/29/2013  FINDINGS: Volume loss and hazy opacities in both lower lobes favoring atelectasis over aspiration pneumonitis. Cardiomegaly observed favoring the ventricles.  Abnormal heterogeneous lesions throughout the liver favoring diffuse hepatic metastatic disease. Index peripheral right hepatic lobe lesion 4.9 x 3.7 cm, difficult to measure on the prior exam due the lack of IV contrast but probably the same.  Spleen grossly unremarkable. Left adrenal mass 5.4 by 4.9 cm, previously 4.9 x 4.6 cm.  Mildly  progressive porta hepatis sudden adenopathy. An aortocaval node measures 1.5 cm in short axis, previously 1.4 cm.  Mesenteric mass is again noted with some calcification. At the level with the calcification, the mass measures 4.6 x 3.0 cm. When I measure this mass in the same fashion  on the prior exam, I get 4.7 x 3.0 cm.  Kidneys and proximal ureters unremarkable. Aside from a mesenteric mass, no pelvic adenopathy is observed.  There is pelvic floor laxity with the urinary bladder extending below the pubococcygeal line and the anorectal junction well below the pubococcygeal line.  IMPRESSION: 1. Partially calcified mesenteric mass with innumerable metastatic lesions in the liver. I am unable to find the pathology report from the prior adrenal biopsy in the EPIC system. 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 Conceivably this could be metastatic lung disease, lymphoma, or other gastrointestinal primary cancer. Correlate with biopsy results. 2. Atelectasis in both lung bases. 3. Cardiomegaly. 4. Pelvic floor laxity.   Electronically Signed   By: Sherryl Barters M.D.   On: 08/23/2013 14:32   Ct Biopsy  08/24/2013   CLINICAL DATA:  63 year old female with partially calcified mesenteric mass, numerous hepatic lesions and a left adrenal lesion. Overall, the imaging findings are concerning for metastatic carcinoid. The patient underwent percutaneous biopsy of the left adrenal mass on 03/31/2013. The biopsy demonstrated atypical cells but yielded insufficient material for diagnosis. Patient presents for attempted CT-guided biopsy of one of her hepatic lesions.  EXAM: CT BIOPSY  Date: 08/24/2013  PROCEDURE: 1. CT-guided fine-needle aspiration of segment 2 hepatic lesion 2. CT-guided core biopsy of segment 2 hepatic lesion Interventional Radiologist:  Criselda Peaches, MD   ANESTHESIA/SEDATION: Moderate (conscious) sedation was used. 21 mg Versed, 100 mcg Fentanyl were administered intravenously. The patient's vital signs were monitored continuously by radiology nursing throughout the procedure.  Sedation Time: 34 minutes  TECHNIQUE: Informed consent was obtained from the patient following explanation of the procedure, risks, benefits and alternatives. The patient understands, agrees and consents for the procedure. All questions were addressed. A time out was performed.  A planning axial CT scan was performed. The hypodense lesion in the posterior aspect of hepatic segment tube was identified. A suitable skin entry site was selected and marked. The region was then sterilely prepped and draped in the standard fashion using Betadine skin prep.  Local anesthesia was attained by infiltration with 1% lidocaine. A small dermatotomy was made. Using intermittent CT guidance, a 17 gauge trocar needle was advanced into the left hepatic lobe and positioned at the margin of the mass. Given the patient's prior nondiagnostic biopsy, fine needle aspiration biopsies were initially obtained using a 20 gauge Francine needle. On-site cytopathologic evaluation confirmed diagnostic material. To facilitate additional immunohistochemical staining, several 18 gauge core biopsies were then coaxially obtained using the BioPince automated biopsy device.  As the 17 gauge trocar needle was removed, the biopsy tract was embolized with a Gel-Foam slurry. Post biopsy axial CT imaging demonstrates no hematoma or complication. The patient tolerated the procedure well.  COMPLICATIONS: None.  IMPRESSION: 1. Technically successful CT-guided fine needle aspiration of left hepatic lesion. 2. CT-guided core biopsy of left hepatic lesion. Signed,  Criselda Peaches, MD  [facility]  [facility]   Electronically Signed   By: Jacqulynn Cadet M.D.   On: 08/24/2013 14:14     ASSESSMENT:   1. Well Differentiated Grade 1 neuroendocrine tumor (Carcinoid) of GI origin with metastasis in liver, left adrenal gland and porta hepatis adenopathy. The overall goal of this therapy is disease control and not curative.  2. Looking at the clinical picture, radiologic findings and pathology, we are dealing  with a slow growing carcinoid tumor which is positive for neuroendocrine markers. The primary tumor, right hepatic index lesion and adrenal lesion have not changed much in 4-5 months. I checked a serum Chromogranin level which can serve as a useful prognostic marker if elevated. It was 33 mildly elevated. A 24 hour urin 5 HIAA level was moderately elevated at 38.1 and we can certainly follow that after 3-4 months of therapy.  3. I will start her initial treatment outpatient with (Sandostatin) LAR also known as Octreotide single agent therapy 30 mg IM once a month. First shot to be given on 09/10/2013 and thereafter once a month. Side effects were discussed and overall it is fairly safe and well tolerated therapy. Although it is advanced stage tumor, patients with Observation alone or octreotide therapy can still survive for a long time. It also has a predilection to go to bones, liver. It can also cause symptoms of flushing, diarrhea, vasomotor symptoms, fatigue, weight loss and right sided heart failure if it involves the Tricuspid valve.  4. For more aggressive tumors, we have other options also available like Xeloda, 5FU, folfox, folfiri regimens, Avastin, Afinitor, temodar+xeloda, option of liver directed therapy with microspheres like Yttrium. Radiotherapy or chemotherapy with streptozotocin, cisplatin, etoposide, and doxorubicin, either alone or in combination, has been used, and reports show some success; a good response occurs in only 20-30% of cases.   5. ONE SHOULD ALSO BE CAREFUL IN PATIENTS LIKE THIS WHERE A SURGICAL PROCEDURE OR EVEN A BIOPSY CAN RESULT IN CARCINOID CRISIS  and should be managed by preoperative use of octreotide bolus or octreotide infusion as per protocol and under supervision of an oncologist.   6. Anemia: two identifiable causes were iron deficiency and zinc deficiency. We will give patient FeraHeme infusion on 09/10/2013 and start her on zinc 50 mg daily. She will also use Vitamin C 500 mg po daily to help with anemia.  7. A script was given today for Glucerna cans because of her diabetes.  8. Diflucan x 5 days was prescribed today for a possible yeast infection.   9. RTC in 1 month for labs, Sandostatin injection and MD visit.    All questions were answered. The patient knows to call the clinic with any problems, questions or concerns. I spent 60 minutes counseling the patient face to face. The total time spent in the appointment was 60 minutes and more than 50% was on counseling.    Bernadene Bell, MD Medical Hematologist/Oncologist [place] Pager: [phone] Office No: [phone]

## 2013-09-10 ENCOUNTER — Other Ambulatory Visit (HOSPITAL_BASED_OUTPATIENT_CLINIC_OR_DEPARTMENT_OTHER): Payer: Medicare (Managed Care)

## 2013-09-10 ENCOUNTER — Other Ambulatory Visit: Payer: Self-pay | Admitting: *Deleted

## 2013-09-10 ENCOUNTER — Ambulatory Visit: Payer: Medicare (Managed Care)

## 2013-09-10 ENCOUNTER — Telehealth: Payer: Self-pay | Admitting: *Deleted

## 2013-09-10 ENCOUNTER — Ambulatory Visit (HOSPITAL_BASED_OUTPATIENT_CLINIC_OR_DEPARTMENT_OTHER): Payer: Medicare (Managed Care)

## 2013-09-10 VITALS — BP 143/62 | HR 63 | Temp 98.1°F | Resp 18

## 2013-09-10 DIAGNOSIS — C7A00 Malignant carcinoid tumor of unspecified site: Secondary | ICD-10-CM

## 2013-09-10 DIAGNOSIS — D3A098 Benign carcinoid tumors of other sites: Secondary | ICD-10-CM

## 2013-09-10 DIAGNOSIS — C787 Secondary malignant neoplasm of liver and intrahepatic bile duct: Secondary | ICD-10-CM

## 2013-09-10 DIAGNOSIS — Z23 Encounter for immunization: Secondary | ICD-10-CM

## 2013-09-10 DIAGNOSIS — E340 Carcinoid syndrome: Secondary | ICD-10-CM

## 2013-09-10 LAB — CBC WITH DIFFERENTIAL/PLATELET
BASO%: 0.5 % (ref 0.0–2.0)
BASOS ABS: 0 10*3/uL (ref 0.0–0.1)
EOS%: 1.3 % (ref 0.0–7.0)
Eosinophils Absolute: 0.1 10*3/uL (ref 0.0–0.5)
HCT: 38.5 % (ref 34.8–46.6)
HEMOGLOBIN: 11.7 g/dL (ref 11.6–15.9)
LYMPH#: 1.4 10*3/uL (ref 0.9–3.3)
LYMPH%: 17.5 % (ref 14.0–49.7)
MCH: 27 pg (ref 25.1–34.0)
MCHC: 30.4 g/dL — ABNORMAL LOW (ref 31.5–36.0)
MCV: 88.7 fL (ref 79.5–101.0)
MONO#: 0.6 10*3/uL (ref 0.1–0.9)
MONO%: 7.9 % (ref 0.0–14.0)
NEUT%: 72.8 % (ref 38.4–76.8)
NEUTROS ABS: 5.7 10*3/uL (ref 1.5–6.5)
Platelets: 319 10*3/uL (ref 145–400)
RBC: 4.34 10*6/uL (ref 3.70–5.45)
RDW: 15.2 % — AB (ref 11.2–14.5)
WBC: 7.9 10*3/uL (ref 3.9–10.3)
nRBC: 0 % (ref 0–0)

## 2013-09-10 MED ORDER — OCTREOTIDE ACETATE 30 MG IM KIT
30.0000 mg | PACK | Freq: Once | INTRAMUSCULAR | Status: AC
  Administered 2013-09-10: 30 mg via INTRAMUSCULAR
  Filled 2013-09-10: qty 1

## 2013-09-10 MED ORDER — INFLUENZA VAC SPLIT QUAD 0.5 ML IM SUSY
0.5000 mL | PREFILLED_SYRINGE | Freq: Once | INTRAMUSCULAR | Status: AC
  Administered 2013-09-10: 0.5 mL via INTRAMUSCULAR
  Filled 2013-09-10: qty 0.5

## 2013-09-10 NOTE — Progress Notes (Signed)
Spoke with Dr.Sebhai to inform him unable to get IV access x 6 attempts. MD aware pt to RTC on September 14,2015 to attempt another IV access, MD aware pt received flu shot and Sandostatin.

## 2013-09-10 NOTE — Patient Instructions (Addendum)

## 2013-09-10 NOTE — Telephone Encounter (Signed)
Per chemo RN I have scheduled appt for 9/14

## 2013-09-20 ENCOUNTER — Ambulatory Visit (HOSPITAL_BASED_OUTPATIENT_CLINIC_OR_DEPARTMENT_OTHER): Payer: Medicare (Managed Care)

## 2013-09-20 VITALS — BP 142/59 | HR 49 | Temp 98.1°F | Resp 20

## 2013-09-20 DIAGNOSIS — D3A098 Benign carcinoid tumors of other sites: Secondary | ICD-10-CM

## 2013-09-20 DIAGNOSIS — D509 Iron deficiency anemia, unspecified: Secondary | ICD-10-CM

## 2013-09-20 MED ORDER — FERUMOXYTOL INJECTION 510 MG/17 ML
1020.0000 mg | Freq: Once | INTRAVENOUS | Status: AC
  Administered 2013-09-20: 1020 mg via INTRAVENOUS
  Filled 2013-09-20: qty 34

## 2013-09-20 NOTE — Progress Notes (Signed)
Pt observed for 30 minutes post feraheme. Tolerated well. Pt aware of potential side effects.

## 2013-09-20 NOTE — Patient Instructions (Signed)

## 2013-10-06 ENCOUNTER — Telehealth: Payer: Self-pay | Admitting: Hematology

## 2013-10-06 NOTE — Telephone Encounter (Signed)
pt called to r/s appt to earlier time..done...pt aware of new d.t

## 2013-10-06 NOTE — Telephone Encounter (Signed)
lvm for pt regarding appt d.t change due to MD on pal..Marland KitchenMarland Kitchen

## 2013-10-07 ENCOUNTER — Other Ambulatory Visit: Payer: Medicare (Managed Care)

## 2013-10-07 ENCOUNTER — Telehealth: Payer: Self-pay | Admitting: Hematology

## 2013-10-07 ENCOUNTER — Ambulatory Visit (HOSPITAL_BASED_OUTPATIENT_CLINIC_OR_DEPARTMENT_OTHER): Payer: Medicare (Managed Care)

## 2013-10-07 ENCOUNTER — Other Ambulatory Visit (HOSPITAL_BASED_OUTPATIENT_CLINIC_OR_DEPARTMENT_OTHER): Payer: Medicare (Managed Care)

## 2013-10-07 ENCOUNTER — Ambulatory Visit (HOSPITAL_BASED_OUTPATIENT_CLINIC_OR_DEPARTMENT_OTHER): Payer: Medicare (Managed Care) | Admitting: Hematology

## 2013-10-07 ENCOUNTER — Ambulatory Visit: Payer: Medicare (Managed Care)

## 2013-10-07 VITALS — BP 144/63 | HR 69 | Temp 98.8°F | Resp 18 | Ht 70.0 in | Wt 336.8 lb

## 2013-10-07 DIAGNOSIS — D3A098 Benign carcinoid tumors of other sites: Secondary | ICD-10-CM

## 2013-10-07 DIAGNOSIS — C7B02 Secondary carcinoid tumors of liver: Secondary | ICD-10-CM

## 2013-10-07 DIAGNOSIS — C7A00 Malignant carcinoid tumor of unspecified site: Secondary | ICD-10-CM

## 2013-10-07 DIAGNOSIS — I509 Heart failure, unspecified: Secondary | ICD-10-CM

## 2013-10-07 DIAGNOSIS — E60 Dietary zinc deficiency: Secondary | ICD-10-CM

## 2013-10-07 DIAGNOSIS — I10 Essential (primary) hypertension: Secondary | ICD-10-CM

## 2013-10-07 DIAGNOSIS — R0602 Shortness of breath: Secondary | ICD-10-CM

## 2013-10-07 DIAGNOSIS — R599 Enlarged lymph nodes, unspecified: Secondary | ICD-10-CM

## 2013-10-07 DIAGNOSIS — R531 Weakness: Secondary | ICD-10-CM

## 2013-10-07 DIAGNOSIS — D509 Iron deficiency anemia, unspecified: Secondary | ICD-10-CM

## 2013-10-07 DIAGNOSIS — E118 Type 2 diabetes mellitus with unspecified complications: Secondary | ICD-10-CM

## 2013-10-07 LAB — CBC WITH DIFFERENTIAL/PLATELET
BASO%: 0.1 % (ref 0.0–2.0)
BASOS ABS: 0 10*3/uL (ref 0.0–0.1)
EOS%: 0.7 % (ref 0.0–7.0)
Eosinophils Absolute: 0.1 10*3/uL (ref 0.0–0.5)
HCT: 38.7 % (ref 34.8–46.6)
HEMOGLOBIN: 12.1 g/dL (ref 11.6–15.9)
LYMPH%: 11 % — AB (ref 14.0–49.7)
MCH: 27.9 pg (ref 25.1–34.0)
MCHC: 31.3 g/dL — ABNORMAL LOW (ref 31.5–36.0)
MCV: 89.2 fL (ref 79.5–101.0)
MONO#: 0.9 10*3/uL (ref 0.1–0.9)
MONO%: 9.4 % (ref 0.0–14.0)
NEUT%: 78.8 % — ABNORMAL HIGH (ref 38.4–76.8)
NEUTROS ABS: 7.5 10*3/uL — AB (ref 1.5–6.5)
NRBC: 0 % (ref 0–0)
PLATELETS: 217 10*3/uL (ref 145–400)
RBC: 4.34 10*6/uL (ref 3.70–5.45)
RDW: 16 % — ABNORMAL HIGH (ref 11.2–14.5)
WBC: 9.5 10*3/uL (ref 3.9–10.3)
lymph#: 1 10*3/uL (ref 0.9–3.3)

## 2013-10-07 MED ORDER — OCTREOTIDE ACETATE 30 MG IM KIT
30.0000 mg | PACK | Freq: Once | INTRAMUSCULAR | Status: AC
  Administered 2013-10-07: 30 mg via INTRAMUSCULAR
  Filled 2013-10-07: qty 1

## 2013-10-07 MED ORDER — LORAZEPAM 0.5 MG PO TABS: 0.5000 mg | ORAL_TABLET | Freq: Every day | ORAL | Status: DC

## 2013-10-07 NOTE — Patient Instructions (Signed)

## 2013-10-07 NOTE — Telephone Encounter (Signed)
, °

## 2013-10-08 ENCOUNTER — Ambulatory Visit: Payer: Medicare (Managed Care)

## 2013-10-08 ENCOUNTER — Other Ambulatory Visit: Payer: Medicare (Managed Care)

## 2013-10-10 ENCOUNTER — Encounter: Payer: Self-pay | Admitting: Hematology

## 2013-10-10 NOTE — Progress Notes (Signed)
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   HISTORY OF PRESENTING ILLNESS:   Yesenia Lyons is a 63 y.o. diabetic female who lives in [place], [place]. 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   In review, patient had been seen at the ED on 03/29/2013 with increasing abdominal pain and shortness of breath accompanied by weight loss and fevers. She also had early satiety, intermittent constipation, decreased appetite. Workup included a CT of the abdomen and pelvis without contrast on the same day, revealing a partially calcified mesenteric mass with innumerable metastatic lesions in the liver suspicious for metastatic carcinoid tumor with extends of liver involvement.   Mesenteric Mass below 5.2 x 4.7 x 3.3 cm    Liver lesions Biggest on Right Lobe 5.1 cm seen below       She underwent a CT-guided core biopsy of the left adrenal gland which was essentially non -diagnostic ([id number]). The overall material was very limited and composed of rare atypical cells in a background of blood and fibrin.   She was discharged on 3/26 after symptoms were controlled with HHPT and RN, and was instructed to follow up as outpatient with PCP and GI specialty. She failed to do so. She was admitted on 08/23/2013 with progressive, similar symptoms described above, worsening over the last week, with dull,central abdominal pain, a 30 lb unintentional weight loss, nausea, anorexia, diarrhea, fatigue, weakness, low grade fevers and chills. She is able tolerate some liquids, but no real solid food. She was short of breath, but no chest pain. She denied any GI or GU bleed.  She had no confusion .No other symptoms are reported. A new CT scan on 8/18 showed again a mesenteric mass 4.6 x 3 cm (previously 4.7 x 3 cm) with multiple lesions in the liver, largest 4.9cm x 3.7 cm (stable), left adrenal mass 5.4 x 4.9 cm (previously 4.9 x 4.6 cm) Atelectasis in lung bases bilaterally, cardiomegaly, pelvic floor laxity.aortocaval lymph node 1.5 cm (1.4 cm before). She also had acute renal failure, leukocytosis with WBC of 21.9, and mild anemia with a Hb of 11.7. Her bili was 1.6.    Biopsy of the liver lesion on 8/18 was positive for metastatic well differentiated neuroendocrine tumor (carcinoid)    CXR 08/23/2013 shows Cardiomegaly with mild interstitial prominence. Mild component of congestive heart failure may be present. Interstitial pneumonitis cannot be excluded. These findings are less prominent than on prior chest x-ray 03/29/2013.  Marland Kitchen  The biopsy reveals nests of cells with moderate cytoplasm and round nuclei with neuroendocrine chromatin.There is necrosis. Immunohistochemistry reveals the cells are positive for CD56,chromogranin,synaptophysin, and CDX-2. They are negative for TTF-1. These findings are consistent with a well differentiated neuroendocrine tumor (carcinoid) of gastrointestinal origin.([id number])   Patient has been evaluated by surgery Dr Zella Richer, concluding that patient is not a candidate for resection. Based on these findings we were requested to see the patient in consultation regarding therapeutic options. This appears to be low grade Carcinoid disease of mesentery with liver and adrenal involvement and retroperitoneal adenopathy as well. We have measurable disease which we can follow on subsequent imaging.A 24-HOUR QUANTITATIVE 5 HIAA  IN URINE was 29.9 (h) normal <=6.0 mg/24 h volume of urine 600 ml/24 hrs. Serum LDH was 229 normal. Copper 164 mcg normal, zinc low at 51 mcg, Ferritin 442 high marker of inflammation, serum iron low at 21 microgram, TIBC 190,  FOLATE 8.4, B12 507 pg normal, CHROMOGRANIN A 33 elevated (normal <15 ng), ESR 123 high, ca19-9 23 normal, CEA 1.6 normal, CA125 25 normal.   Patient started systemic therapy with Octreotide (Sandostatin) 30 mg IM once a month and receive her first shot on 09/10/2013. She was also given Iron replacement with FERAHEME on 09/20/13 1,020 mg.   INTERVAL HISTORY:  Patient is experiencing some shortness of breath. She will follow up with cardiology next month. I told her to cut back on the caffeinated beverages she does drink tea different varieties. Patient denies any diarrhea flushing wheezing weight loss loss of appetite. She does have hypertension diabetes congestive heart failure history.   MEDICAL HISTORY:  Past Medical History  Diagnosis Date  . Pneumonia   . Renal disorder     renal failure  . Obesity   . Hypertension   . Diabetes mellitus   . High cholesterol   . CHF (congestive heart failure)     SURGICAL HISTORY: Past Surgical History  Procedure Laterality Date  . Abdominal hysterectomy    . Knee reconstruction, medial patellar femoral ligament      SOCIAL HISTORY: History   Social History  . Marital Status: Single    Spouse Name: N/A    Number of Children: N/A  . Years of Education: N/A   Occupational History  . Not on file.   Social History Main Topics  . Smoking status: Never Smoker   . Smokeless tobacco: Never Used  . Alcohol Use: No  . Drug Use: No  . Sexual Activity: No   Other Topics Concern  . Not on file   Social History Narrative  . No narrative on file   Retired. Originally from [place], [place]. Retired. 1 daughter whom she lives now here in [facility]. Never smoked cigarettes. Denies history of ETOH or recreational drugs.   FAMILY HISTORY:  Patient is adopted, family history is not known, except that she has 1 sister with breast cancer. 1 daughter, 1 grandson 55 years old.   ALLERGIES:  is allergic to penicillins. It causes itching and  swelling.  MEDICATIONS:  Current Outpatient Prescriptions  Medication Sig Dispense Refill  . acetaminophen (TYLENOL) 650 MG CR tablet Take 650 mg by mouth every 8 (eight) hours as needed for pain.      Marland Kitchen albuterol (PROVENTIL HFA;VENTOLIN HFA) 108 (90 BASE) MCG/ACT inhaler Inhale 2 puffs into the lungs every 6 (six) hours as needed for wheezing.       Marland Kitchen amLODipine-benazepril (LOTREL) 10-20 MG per capsule Take 1 capsule by mouth daily.      Marland Kitchen atenolol (TENORMIN) 50 MG tablet Take 50 mg by mouth daily.      . beclomethasone (QVAR) 80 MCG/ACT inhaler Inhale 1 puff into the lungs as needed (sob). breathing      . Chromium-Cinnamon (CINNAMON PLUS CHROMIUM) [phone] MCG-MG CAPS Take 1 capsule by mouth daily.      . Cranberry (HM CRANBERRY SUPER STRENGTH) 300 MG tablet Take 300 mg by mouth 2 (two) times daily.      . feeding supplement, ENSURE COMPLETE, (ENSURE COMPLETE) LIQD Take 237 mLs by mouth 2 (two) times daily between meals.  30 Bottle  0  . feeding supplement, GLUCERNA  SHAKE, (GLUCERNA SHAKE) LIQD Take 237 mLs by mouth 2 (two) times daily between meals.  30 Can  3  . ferrous gluconate (FERGON) 324 MG tablet Take 1 tablet (324 mg total) by mouth 2 (two) times daily with a meal.  60 tablet  3  . fluconazole (DIFLUCAN) 200 MG tablet Take 1 tablet by mouth daily for 5 days.  5 tablet  0  . furosemide (LASIX) 40 MG tablet Take 1 tablet (40 mg total) by mouth daily.  10 tablet  0  . gabapentin (NEURONTIN) 400 MG capsule Take 400 mg by mouth 3 (three) times daily.      . Ginger, Zingiber officinalis, (GINGER ROOT) 550 MG CAPS Take 1 capsule by mouth daily.      Marland Kitchen HYDROcodone-acetaminophen (NORCO/VICODIN) 5-325 MG per tablet Take 2 tablets by mouth every 6 (six) hours as needed for moderate pain.  30 tablet  0  . insulin aspart (NOVOLOG) 100 UNIT/ML injection Inject 40-50 Units into the skin 3 (three) times daily before meals. Inject 50 units at breakfast and 40 units at  lunch and 40 units at dinner. Pt  on sliding scale      . LORazepam (ATIVAN) 0.5 MG tablet Take 1 tablet (0.5 mg total) by mouth at bedtime.  30 tablet  0  . ondansetron (ZOFRAN ODT) 4 MG disintegrating tablet Take 1 tablet (4 mg total) by mouth every 8 (eight) hours as needed for nausea or vomiting.  20 tablet  0  . spironolactone (ALDACTONE) 25 MG tablet Take 25 mg by mouth daily.      . vitamin C (ASCORBIC ACID) 500 MG tablet Take 1 tablet (500 mg total) by mouth daily.  30 tablet  5  . Zinc 50 MG TABS Take 1 tablet (50 mg total) by mouth daily.  30 tablet  5   No current facility-administered medications for this visit.    Review of Systems  Constitutional: Positive for malaise/fatigue. Negative for fever, chills, weight loss and diaphoresis.  HENT: Negative for congestion, ear pain, hearing loss, nosebleeds and sore throat.   Eyes: Negative for blurred vision, double vision and pain.  Respiratory: Positive for cough and shortness of breath. Negative for hemoptysis, sputum production and wheezing.   Cardiovascular: Negative for chest pain, palpitations, orthopnea, claudication, leg swelling and PND.  Gastrointestinal: Positive for diarrhea. Negative for heartburn, nausea, vomiting, abdominal pain, constipation and blood in stool.  Genitourinary: Negative for dysuria and frequency.  Musculoskeletal: Negative for back pain, joint pain, myalgias and neck pain.  Skin: Negative for itching and rash.  Neurological: Positive for weakness. Negative for dizziness, sensory change, focal weakness and headaches.  Endo/Heme/Allergies: Does not bruise/bleed easily.  Psychiatric/Behavioral: Negative for depression and memory loss. The patient is not nervous/anxious and does not have insomnia.     PHYSICAL EXAMINATION: ECOG PERFORMANCE STATUS: 1, KPS 80  Filed Vitals:   10/07/13 0924  BP: 144/63  Pulse: 69  Temp: 98.8 F (37.1 C)  Resp: 18   Filed Weights   10/07/13 0924  Weight: 336 lb 12.8 oz (152.771 kg)    Physical  Exam  Constitutional: She is oriented to person, place, and time. She appears well-developed and well-nourished.  HENT:  Head: Normocephalic and atraumatic.  Mouth/Throat: Oropharynx is clear and moist.  Eyes: Conjunctivae and EOM are normal. Pupils are equal, round, and reactive to light.  Neck: Normal range of motion. Neck supple.  Cardiovascular: Normal rate, regular rhythm and normal heart sounds.   Pulmonary/Chest:  Effort normal and breath sounds normal. She has no wheezes. She has no rales.  Abdominal: Soft. Bowel sounds are normal. She exhibits no mass. There is no tenderness.  Musculoskeletal: Normal range of motion. She exhibits no edema.  Neurological: She is alert and oriented to person, place, and time. She has normal reflexes.  Skin: Skin is warm and dry.  Psychiatric: She has a normal mood and affect. Her behavior is normal. Judgment and thought content normal.    LABORATORY DATA:       RADIOGRAPHIC STUDIES:  I have personally reviewed the radiological images as listed and agreed with the findings in the report. Dg Chest 2 View  08/23/2013   CLINICAL DATA:  Cough and congestion.  Shortness of breath.  EXAM: CHEST  2 VIEW  COMPARISON:  03/29/2013.  10/29/2011.  FINDINGS: Mediastinum and hilar structures are normal. Mild cardiomegaly and pulmonary interstitial prominence. Mild component of congestive heart failure cannot be excluded. Mild pneumonitis cannot be excluded. No pleural effusion or pneumothorax. No acute osseus abnormality.  IMPRESSION: Cardiomegaly with mild interstitial prominence. Mild component of congestive heart failure may be present. Interstitial pneumonitis cannot be excluded. These findings are less prominent than on prior chest x-ray 03/29/2013.   Electronically Signed   By: Marcello Moores  Register   On: 08/23/2013 17:05   US Abdomen Complete  08/23/2013   CLINICAL DATA:  Abdominal pain for 1 week, vomiting, question cholecystitis, history hypertension  EXAM:  ULTRASOUND ABDOMEN COMPLETE  COMPARISON:  Ultrasound abdomen 03/31/2013, CT abdomen and pelvis 03/29/2013  FINDINGS: Gallbladder:  Normally distended without stones or wall thickening.  No pericholecystic fluid or sonographic Murphy sign.  Common bile duct:  Diameter: 4 mm diameter, normal  Liver:  Slightly heterogeneous echogenicity. No definite focal mass lesions. Hepatopetal portal venous flow.  IVC:  Normal appearance  Pancreas:  Tail incompletely visualized, visualized portions normal appearance  Spleen:  Normal appearance, 6.6 cm length  Right Kidney:  Length: 10.3 cm.  Normal morphology without mass or hydronephrosis.  Left Kidney:  Length: 11.3 cm.  Normal morphology without mass or hydronephrosis.  Abdominal aorta:  Bifurcation obscured by bowel gas. Visualized portion normal caliber.  Other findings:  No free-fluid  IMPRESSION: Incomplete visualization of distal aorta and pancreatic tail.  Mildly inhomogeneous hepatic echogenicity without discrete mass.  No evidence of cholecystitis or cholelithiasis.   Electronically Signed   By: Lavonia Dana M.D.   On: 08/23/2013 12:26   Ct Abdomen Pelvis W Contrast  08/23/2013   CLINICAL DATA:  Mid abdominal pain.  Vomiting.  EXAM: CT ABDOMEN AND PELVIS WITH CONTRAST  TECHNIQUE: Multidetector CT imaging of the abdomen and pelvis was performed using the standard protocol following bolus administration of intravenous contrast.  CONTRAST:  67m OMNIPAQUE IOHEXOL 300 MG/ML SOLN, 1075mOMNIPAQUE IOHEXOL 300 MG/ML SOLN  COMPARISON:  Multiple exams, including 08/23/2013 and 03/29/2013  FINDINGS: Volume loss and hazy opacities in both lower lobes favoring atelectasis over aspiration pneumonitis. Cardiomegaly observed favoring the ventricles.  Abnormal heterogeneous lesions throughout the liver favoring diffuse hepatic metastatic disease. Index peripheral right hepatic lobe lesion 4.9 x 3.7 cm, difficult to measure on the prior exam due the lack of IV contrast but probably the  same.  Spleen grossly unremarkable. Left adrenal mass 5.4 by 4.9 cm, previously 4.9 x 4.6 cm.  Mildly progressive porta hepatis sudden adenopathy. An aortocaval node measures 1.5 cm in short axis, previously 1.4 cm.  Mesenteric mass is again noted with some calcification. At the level  with the calcification, the mass measures 4.6 x 3.0 cm. When I measure this mass in the same fashion on the prior exam, I get 4.7 x 3.0 cm.  Kidneys and proximal ureters unremarkable. Aside from a mesenteric mass, no pelvic adenopathy is observed.  There is pelvic floor laxity with the urinary bladder extending below the pubococcygeal line and the anorectal junction well below the pubococcygeal line.  IMPRESSION: 1. Partially calcified mesenteric mass with innumerable metastatic lesions in the liver. I am unable to find the pathology report from the prior adrenal biopsy in the EPIC system. 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 Conceivably this could be metastatic lung disease, lymphoma, or other gastrointestinal primary cancer. Correlate with biopsy results. 2. Atelectasis in both lung bases. 3. Cardiomegaly. 4. Pelvic floor laxity.   Electronically Signed   By: Sherryl Barters M.D.   On: 08/23/2013 14:32   Ct Biopsy  08/24/2013   CLINICAL DATA:  63 year old female with partially calcified mesenteric mass, numerous hepatic lesions and a left adrenal lesion. Overall, the imaging findings are concerning for metastatic carcinoid. The patient underwent percutaneous biopsy of the left adrenal mass on 03/31/2013. The biopsy demonstrated atypical cells but yielded insufficient material for diagnosis. Patient presents for attempted CT-guided biopsy of one of her hepatic lesions.  EXAM: CT BIOPSY  Date: 08/24/2013  PROCEDURE: 1. CT-guided fine-needle aspiration of segment 2 hepatic lesion 2. CT-guided core  biopsy of segment 2 hepatic lesion Interventional Radiologist:  Criselda Peaches, MD  ANESTHESIA/SEDATION: Moderate (conscious) sedation was used. 21 mg Versed, 100 mcg Fentanyl were administered intravenously. The patient's vital signs were monitored continuously by radiology nursing throughout the procedure.  Sedation Time: 34 minutes  TECHNIQUE: Informed consent was obtained from the patient following explanation of the procedure, risks, benefits and alternatives. The patient understands, agrees and consents for the procedure. All questions were addressed. A time out was performed.  A planning axial CT scan was performed. The hypodense lesion in the posterior aspect of hepatic segment tube was identified. A suitable skin entry site was selected and marked. The region was then sterilely prepped and draped in the standard fashion using Betadine skin prep.  Local anesthesia was attained by infiltration with 1% lidocaine. A small dermatotomy was made. Using intermittent CT guidance, a 17 gauge trocar needle was advanced into the left hepatic lobe and positioned at the margin of the mass. Given the patient's prior nondiagnostic biopsy, fine needle aspiration biopsies were initially obtained using a 20 gauge Francine needle. On-site cytopathologic evaluation confirmed diagnostic material. To facilitate additional immunohistochemical staining, several 18 gauge core biopsies were then coaxially obtained using the BioPince automated biopsy device.  As the 17 gauge trocar needle was removed, the biopsy tract was embolized with a Gel-Foam slurry. Post biopsy axial CT imaging demonstrates no hematoma or complication. The patient tolerated the procedure well.  COMPLICATIONS: None.  IMPRESSION: 1. Technically successful CT-guided fine needle aspiration of left hepatic lesion. 2. CT-guided core biopsy of left hepatic lesion. Signed,  Criselda Peaches, MD  [facility]  [facility]  Radiology   Electronically Signed   By: Jacqulynn Cadet M.D.   On: 08/24/2013 14:14    ASSESSMENT:   1. Well Differentiated Grade 1 neuroendocrine tumor (Carcinoid) of GI origin with metastasis in liver, left adrenal gland and porta hepatis adenopathy. The overall goal of this therapy is  disease control and not curative.  2. We are dealing with a slow growing carcinoid tumor which is positive for neuroendocrine markers. The primary tumor, right hepatic index lesion and adrenal lesion have not changed much in 4-5 months. I checked a serum Chromogranin level which can serve as a useful prognostic marker if elevated. It was 33 mildly elevated. A 24 hour urine 5 HIAA level was moderately elevated at 44.3 and we can certainly follow that after 3-4 months of therapy.  3. I started her initial treatment outpatient with (Sandostatin) LAR also known as Octreotide single agent therapy 30 mg IM once a month. First shot given on 09/10/2013 and today she gets her second shot. Side effects were discussed and overall it is fairly safe and well tolerated therapy. Although it is advanced stage tumor, patients with Observation alone or octreotide therapy can still survive for a long time. It also has a predilection to go to bones, liver. It can also cause symptoms of flushing, diarrhea, vasomotor symptoms, fatigue, weight loss and right sided heart failure if it involves the Tricuspid valve.  4. For more aggressive tumors, we have other options also available like Xeloda, 5FU, folfox, folfiri regimens, Avastin, Afinitor, temodar+xeloda, option of liver directed therapy with microspheres like Yttrium. Radiotherapy or chemotherapy with streptozotocin, cisplatin, etoposide, and doxorubicin, either alone or in combination, has been used, and reports show some success; a good response occurs in only 20-30% of cases.   5. ONE SHOULD ALSO BE CAREFUL IN PATIENTS LIKE THIS WHERE A SURGICAL PROCEDURE OR EVEN A BIOPSY CAN RESULT IN  CARCINOID CRISIS and should be managed by preoperative use of octreotide bolus or octreotide infusion as per protocol and under supervision of an oncologist.   6. Anemia: two identifiable causes were iron deficiency and zinc deficiency. We will give patient FeraHeme infusion on 09/10/2013 and start her on zinc 50 mg daily. She will also use Vitamin C 500 mg po daily to help with anemia.  7. She was told to use Glucerna cans because of her diabetes.  8. RTC in 1 month for labs, Sandostatin injection and MD visit.    All questions were answered. The patient knows to call the clinic with any problems, questions or concerns. I spent 60 minutes counseling the patient face to face. The total time spent in the appointment was 60 minutes and more than 50% was on counseling.    Bernadene Bell, MD Medical Hematologist/Oncologist [place] Pager: [phone] Office No: [phone]

## 2013-10-30 ENCOUNTER — Emergency Department (HOSPITAL_COMMUNITY): Payer: Medicare (Managed Care)

## 2013-10-30 ENCOUNTER — Emergency Department (HOSPITAL_COMMUNITY)
Admission: EM | Admit: 2013-10-30 | Discharge: 2013-10-30 | Disposition: A | Discharge status: Home / Self Care | Payer: Medicare (Managed Care) | Attending: Emergency Medicine | Admitting: Emergency Medicine

## 2013-10-30 ENCOUNTER — Encounter (HOSPITAL_COMMUNITY): Payer: Self-pay | Admitting: Emergency Medicine

## 2013-10-30 DIAGNOSIS — Z880 Allergy status to penicillin: Secondary | ICD-10-CM | POA: Diagnosis not present

## 2013-10-30 DIAGNOSIS — E119 Type 2 diabetes mellitus without complications: Secondary | ICD-10-CM | POA: Diagnosis not present

## 2013-10-30 DIAGNOSIS — R140 Abdominal distension (gaseous): Secondary | ICD-10-CM | POA: Diagnosis not present

## 2013-10-30 DIAGNOSIS — G893 Neoplasm related pain (acute) (chronic): Secondary | ICD-10-CM | POA: Diagnosis not present

## 2013-10-30 DIAGNOSIS — I129 Hypertensive chronic kidney disease with stage 1 through stage 4 chronic kidney disease, or unspecified chronic kidney disease: Secondary | ICD-10-CM | POA: Diagnosis not present

## 2013-10-30 DIAGNOSIS — R110 Nausea: Secondary | ICD-10-CM | POA: Insufficient documentation

## 2013-10-30 DIAGNOSIS — C7B8 Other secondary neuroendocrine tumors: Secondary | ICD-10-CM | POA: Insufficient documentation

## 2013-10-30 DIAGNOSIS — R630 Anorexia: Secondary | ICD-10-CM | POA: Diagnosis not present

## 2013-10-30 DIAGNOSIS — E669 Obesity, unspecified: Secondary | ICD-10-CM | POA: Diagnosis not present

## 2013-10-30 DIAGNOSIS — Z79899 Other long term (current) drug therapy: Secondary | ICD-10-CM | POA: Insufficient documentation

## 2013-10-30 DIAGNOSIS — Z8701 Personal history of pneumonia (recurrent): Secondary | ICD-10-CM | POA: Insufficient documentation

## 2013-10-30 DIAGNOSIS — I509 Heart failure, unspecified: Secondary | ICD-10-CM | POA: Diagnosis not present

## 2013-10-30 DIAGNOSIS — R5383 Other fatigue: Secondary | ICD-10-CM | POA: Insufficient documentation

## 2013-10-30 DIAGNOSIS — R079 Chest pain, unspecified: Secondary | ICD-10-CM | POA: Insufficient documentation

## 2013-10-30 DIAGNOSIS — R531 Weakness: Secondary | ICD-10-CM | POA: Diagnosis not present

## 2013-10-30 DIAGNOSIS — C787 Secondary malignant neoplasm of liver and intrahepatic bile duct: Secondary | ICD-10-CM | POA: Diagnosis not present

## 2013-10-30 DIAGNOSIS — Z794 Long term (current) use of insulin: Secondary | ICD-10-CM | POA: Insufficient documentation

## 2013-10-30 DIAGNOSIS — N189 Chronic kidney disease, unspecified: Secondary | ICD-10-CM | POA: Diagnosis not present

## 2013-10-30 DIAGNOSIS — R109 Unspecified abdominal pain: Secondary | ICD-10-CM | POA: Diagnosis not present

## 2013-10-30 LAB — I-STAT TROPONIN, ED: Troponin i, poc: 0 ng/mL (ref 0.00–0.08)

## 2013-10-30 LAB — CBC
HEMATOCRIT: 40.5 % (ref 36.0–46.0)
HEMOGLOBIN: 12.3 g/dL (ref 12.0–15.0)
MCH: 28 pg (ref 26.0–34.0)
MCHC: 30.4 g/dL (ref 30.0–36.0)
MCV: 92 fL (ref 78.0–100.0)
Platelets: 238 10*3/uL (ref 150–400)
RBC: 4.4 MIL/uL (ref 3.87–5.11)
RDW: 15.8 % — ABNORMAL HIGH (ref 11.5–15.5)
WBC: 13.6 10*3/uL — ABNORMAL HIGH (ref 4.0–10.5)

## 2013-10-30 LAB — BASIC METABOLIC PANEL
Anion gap: 13 (ref 5–15)
BUN: 21 mg/dL (ref 6–23)
CO2: 28 meq/L (ref 19–32)
CREATININE: 1.56 mg/dL — AB (ref 0.50–1.10)
Calcium: 9 mg/dL (ref 8.4–10.5)
Chloride: 100 mEq/L (ref 96–112)
GFR calc Af Amer: 39 mL/min — ABNORMAL LOW (ref >90)
GFR calc non Af Amer: 34 mL/min — ABNORMAL LOW (ref >90)
Glucose, Bld: 117 mg/dL — ABNORMAL HIGH (ref 70–99)
Potassium: 3.9 mEq/L (ref 3.7–5.3)
Sodium: 141 mEq/L (ref 137–147)

## 2013-10-30 LAB — HEPATIC FUNCTION PANEL
ALT: 14 U/L (ref 0–35)
AST: 21 U/L (ref 0–37)
Albumin: 3.7 g/dL (ref 3.5–5.2)
Alkaline Phosphatase: 253 U/L — ABNORMAL HIGH (ref 39–117)
BILIRUBIN INDIRECT: 0.9 mg/dL (ref 0.3–0.9)
Bilirubin, Direct: 0.3 mg/dL (ref 0.0–0.3)
Total Bilirubin: 1.2 mg/dL (ref 0.3–1.2)
Total Protein: 8.2 g/dL (ref 6.0–8.3)

## 2013-10-30 LAB — TROPONIN I: Troponin I: <0.3 ng/mL (ref <0.30)

## 2013-10-30 LAB — MAGNESIUM: MAGNESIUM: 2 mg/dL (ref 1.5–2.5)

## 2013-10-30 MED ORDER — ASPIRIN 81 MG PO CHEW
324.0000 mg | CHEWABLE_TABLET | Freq: Once | ORAL | Status: AC
  Administered 2013-10-30: 324 mg via ORAL
  Filled 2013-10-30: qty 4

## 2013-10-30 MED ORDER — HYDROMORPHONE HCL 1 MG/ML IJ SOLN
1.0000 mg | Freq: Once | INTRAMUSCULAR | Status: AC
  Administered 2013-10-30: 1 mg via INTRAVENOUS
  Filled 2013-10-30: qty 1

## 2013-10-30 MED ORDER — OXYCODONE-ACETAMINOPHEN 10-325 MG PO TABS: 1.0000 | ORAL_TABLET | ORAL | Status: DC | PRN

## 2013-10-30 MED ORDER — OXYCODONE HCL ER 10 MG PO T12A
10.0000 mg | EXTENDED_RELEASE_TABLET | Freq: Two times a day (BID) | ORAL | Status: DC
  Administered 2013-10-30: 10 mg via ORAL
  Filled 2013-10-30: qty 1

## 2013-10-30 NOTE — ED Provider Notes (Addendum)
CSN: [id number]     Arrival date & time 10/30/13  1743 History   First MD Initiated Contact with Patient 10/30/13 1824     Chief Complaint  Patient presents with  . Chest Pain     (Consider location/radiation/quality/duration/timing/severity/associated sxs/prior Treatment) HPI Comments: Pt comes in with cc of dull, cenral chest pain that started y'day. 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 Pain starts in the chest, and is moving to the abdomen. Pain is constant, with no specific aggravating or relieving factors. There is nausea, no emesis, no cough, no diarrhea, fevers or chills. Pt is getting infusion q 1 month.   Patient is a 63 y.o. female presenting with chest pain. The history is provided by the patient.  Chest Pain Associated symptoms: abdominal pain, fatigue, nausea and weakness   Associated symptoms: no fever, no headache, no shortness of breath and not vomiting     Past Medical History  Diagnosis Date  . Pneumonia   . Renal disorder     renal failure  . Obesity   . Hypertension   . Diabetes mellitus   . High cholesterol   . CHF (congestive heart failure)    Past Surgical History  Procedure Laterality Date  . Abdominal hysterectomy    . Knee reconstruction, medial patellar femoral ligament     No family history on file. History  Substance Use Topics  . Smoking status: Never Smoker   . Smokeless tobacco: Never Used  . Alcohol Use: No   OB History   Grav Para Term Preterm Abortions TAB SAB Ect Mult Living                 Review of Systems  Constitutional: Positive for appetite change and fatigue. Negative for fever and chills.  Respiratory: Negative for shortness of breath.   Cardiovascular: Positive for chest pain.  Gastrointestinal: Positive for nausea and abdominal pain. Negative for vomiting.  Genitourinary: Negative for dysuria and flank pain.  Musculoskeletal: Negative for neck pain.  Neurological: Positive  for weakness. Negative for headaches.      Allergies  Penicillins  Home Medications   Prior to Admission medications   Medication Sig Start Date End Date Taking? Authorizing Provider  acetaminophen (TYLENOL) 650 MG CR tablet Take 650 mg by mouth every 8 (eight) hours as needed for pain.   Yes Historical Provider, MD  albuterol (PROVENTIL HFA;VENTOLIN HFA) 108 (90 BASE) MCG/ACT inhaler Inhale 2 puffs into the lungs every 6 (six) hours as needed for wheezing.    Yes Historical Provider, MD  amLODipine-benazepril (LOTREL) 10-20 MG per capsule Take 1 capsule by mouth daily.   Yes Historical Provider, MD  atenolol (TENORMIN) 50 MG tablet Take 50 mg by mouth daily.   Yes Historical Provider, MD  beclomethasone (QVAR) 80 MCG/ACT inhaler Inhale 1 puff into the lungs as needed (sob). breathing   Yes Historical Provider, MD  Cranberry (HM CRANBERRY SUPER STRENGTH) 300 MG tablet Take 300 mg by mouth 2 (two) times daily.   Yes Historical Provider, MD  feeding supplement, ENSURE COMPLETE, (ENSURE COMPLETE) LIQD Take 237 mLs by mouth 2 (two) times daily between meals. 04/01/13  Yes Nishant Dhungel, MD  feeding supplement, GLUCERNA SHAKE, (GLUCERNA SHAKE) LIQD Take 237 mLs by mouth 2 (two) times daily between meals. 09/02/13  Yes Aasim Marla Roe, MD  ferrous gluconate (FERGON) 324 MG tablet Take 324 mg by mouth 2 (two) times daily with a meal.  Yes Historical Provider, MD  furosemide (LASIX) 40 MG tablet Take 1 tablet (40 mg total) by mouth daily. 10/29/11  Yes Julieta Bellini, NP  gabapentin (NEURONTIN) 400 MG capsule Take 400 mg by mouth 3 (three) times daily.   Yes Historical Provider, MD  Ginger, Zingiber officinalis, (GINGER ROOT) 550 MG CAPS Take 1 capsule by mouth daily.   Yes Historical Provider, MD  HYDROcodone-acetaminophen (NORCO/VICODIN) 5-325 MG per tablet Take 2 tablets by mouth every 6 (six) hours as needed for moderate pain. 08/28/13  Yes Velvet Bathe, MD  insulin aspart (NOVOLOG) 100  UNIT/ML injection Inject 40-50 Units into the skin 3 (three) times daily before meals. Inject 50 units at breakfast and 40 units at  lunch and 40 units at dinner. Pt on sliding scale   Yes Historical Provider, MD  insulin glargine (LANTUS) 100 UNIT/ML injection Inject 50 Units into the skin 2 (two) times daily. In the morning and at bedtime.   Yes Historical Provider, MD  LORazepam (ATIVAN) 0.5 MG tablet Take 0.5 mg by mouth at bedtime as needed for anxiety.   Yes Historical Provider, MD  ondansetron (ZOFRAN ODT) 4 MG disintegrating tablet Take 1 tablet (4 mg total) by mouth every 8 (eight) hours as needed for nausea or vomiting. 08/28/13  Yes Velvet Bathe, MD  spironolactone (ALDACTONE) 25 MG tablet Take 25 mg by mouth daily.   Yes Historical Provider, MD  vitamin C (ASCORBIC ACID) 500 MG tablet Take 1 tablet (500 mg total) by mouth daily. 09/02/13  Yes Aasim Marla Roe, MD  Zinc 50 MG TABS Take 1 tablet (50 mg total) by mouth daily. 09/02/13  Yes Aasim Marla Roe, MD  oxyCODONE-acetaminophen (PERCOCET) 10-325 MG per tablet Take 1 tablet by mouth every 4 (four) hours as needed for pain. 10/30/13   Starlina Lapre, MD   BP 113/56  Pulse 58  Temp(Src) 98.5 F (36.9 C) (Oral)  Resp 22  SpO2 96% Physical Exam  Nursing note and vitals reviewed. Constitutional: She is oriented to person, place, and time. She appears well-developed and well-nourished.  HENT:  Head: Normocephalic and atraumatic.  Eyes: EOM are normal. Pupils are equal, round, and reactive to light.  Neck: Neck supple.  Cardiovascular: Normal rate, regular rhythm and normal heart sounds.   No murmur heard. Pulmonary/Chest: Effort normal. No respiratory distress. She exhibits tenderness.  Reproducible tenderness over the chest  Abdominal: Soft. She exhibits distension. There is no tenderness. There is no rebound and no guarding.  Neurological: She is alert and oriented to person, place, and time.  Skin: Skin is warm and dry.     ED Course  Procedures (including critical care time) Labs Review Labs Reviewed  CBC - Abnormal; Notable for the following:    WBC 13.6 (*)    RDW 15.8 (*)    All other components within normal limits  BASIC METABOLIC PANEL - Abnormal; Notable for the following:    Glucose, Bld 117 (*)    Creatinine, Ser 1.56 (*)    GFR calc non Af Amer 34 (*)    GFR calc Af Amer 39 (*)    All other components within normal limits  HEPATIC FUNCTION PANEL - Abnormal; Notable for the following:    Alkaline Phosphatase 253 (*)    All other components within normal limits  MAGNESIUM  TROPONIN I  Randolm Idol, ED    Imaging Review Dg Chest 2 View  10/30/2013   CLINICAL DATA:  Dull ache.  Mild shortness of breath.  EXAM: CHEST  2 VIEW  COMPARISON:  Chest radiograph 08/23/2013.  FINDINGS: Lateral view limited, as the patient could not completely raise their arms. Cardiomegaly is stable. The lung volumes are quite low bilaterally. There is focal atelectasis or scarring at the lateral left lung base. Pulmonary vascularity appears upper normal. No definite airspace disease fusion. Negative for pneumothorax. Soft tissues of the neck partially obscured lung apices. Degenerative changes of the thoracic spine.  IMPRESSION: Lateral views limited by patient on arms. Stable cardiomegaly. Left basilar atelectasis. Otherwise, no acute findings.   Electronically Signed   By: Curlene Dolphin M.D.   On: 10/30/2013 19:23     EKG Interpretation   Date/Time:  Saturday October 30 2013 18:04:42 EDT Ventricular Rate:  54 PR Interval:  168 QRS Duration: 90 QT Interval:  635 QTC Calculation: 602 R Axis:   1 Text Interpretation:  Sinus rhythm Borderline T abnormalities, diffuse  leads Prolonged QT interval No significant changes, except QT prologation  Confirmed by Kathrynn Humble, MD, Thelma Comp [phone]) on 10/30/2013 6:40:16 PM      MDM   Final diagnoses:  Chest pain  Cancer associated pain  Generalized weakness     Pt comes in with cc of weakness and chest pain. No CAD hx. Chest pain, radiates to her epigastrium, and is atypical She has known neuroendocrine cancer, with mets to the liver, and possibly in the lung and other GI reason. I suspect that her weakness and pain is from the cancer. We will still get basic labs and troponin. EKG initial one showed QT prolongation, repeat shows no prolongation. Will email the oncoligist, to see if they can see the patient sooner. I dont see any utility in getting a CT scan of the patient, with 0 SIRS and no surgical abdominal findings, and rather have Oncologist decide if the management changes. I discussed this with the patient and family  - and they are fine with the plan. They want increased pain control - so we will go up to oxy 10 from 5.  Varney Biles, MD 10/30/13 3710  Varney Biles, MD 10/30/13 2316

## 2013-10-30 NOTE — ED Notes (Signed)
She c/o central area "dull ache" since yesterday.  She also c/o lower abd. Pain which she states is not new.  Her skin is normal, warm and dry and she is very mildly short of breath.  EKG performed at triage.

## 2013-10-30 NOTE — Discharge Instructions (Signed)
We saw you in the ER for the weakness and pain. All the results in the ER are normal, labs and imaging. We are not sure what is causing your symptoms. We suspect that the cancer is likely causing the weakness and the pain. The workup in the ER is not complete, and is limited to screening for life threatening and emergent conditions only, so please see cancer doctor as soon as possible.  Please return to the ER if your symptoms worsen; you have increased pain, fevers, chills, inability to keep any medications down, confusion. Otherwise see the outpatient doctor as requested.

## 2013-11-05 ENCOUNTER — Telehealth: Payer: Self-pay | Admitting: Hematology

## 2013-11-05 ENCOUNTER — Other Ambulatory Visit (HOSPITAL_BASED_OUTPATIENT_CLINIC_OR_DEPARTMENT_OTHER): Payer: Medicare (Managed Care)

## 2013-11-05 ENCOUNTER — Ambulatory Visit (HOSPITAL_BASED_OUTPATIENT_CLINIC_OR_DEPARTMENT_OTHER): Payer: Medicare (Managed Care)

## 2013-11-05 ENCOUNTER — Ambulatory Visit (HOSPITAL_BASED_OUTPATIENT_CLINIC_OR_DEPARTMENT_OTHER): Payer: Medicare (Managed Care) | Admitting: Hematology

## 2013-11-05 VITALS — BP 157/64 | HR 52 | Temp 98.3°F | Resp 18 | Ht 70.0 in | Wt 332.5 lb

## 2013-11-05 VITALS — BP 156/76 | HR 48 | Temp 97.4°F | Resp 18

## 2013-11-05 DIAGNOSIS — R8299 Other abnormal findings in urine: Secondary | ICD-10-CM

## 2013-11-05 DIAGNOSIS — C7B02 Secondary carcinoid tumors of liver: Secondary | ICD-10-CM

## 2013-11-05 DIAGNOSIS — D509 Iron deficiency anemia, unspecified: Secondary | ICD-10-CM

## 2013-11-05 DIAGNOSIS — J40 Bronchitis, not specified as acute or chronic: Secondary | ICD-10-CM | POA: Insufficient documentation

## 2013-11-05 DIAGNOSIS — E618 Deficiency of other specified nutrient elements: Secondary | ICD-10-CM

## 2013-11-05 DIAGNOSIS — C7A00 Malignant carcinoid tumor of unspecified site: Secondary | ICD-10-CM

## 2013-11-05 DIAGNOSIS — R9431 Abnormal electrocardiogram [ECG] [EKG]: Secondary | ICD-10-CM

## 2013-11-05 DIAGNOSIS — C7B09 Secondary carcinoid tumors of other sites: Secondary | ICD-10-CM

## 2013-11-05 DIAGNOSIS — D3A098 Benign carcinoid tumors of other sites: Secondary | ICD-10-CM

## 2013-11-05 LAB — CBC WITH DIFFERENTIAL/PLATELET
BASO%: 1.2 % (ref 0.0–2.0)
Basophils Absolute: 0.1 10*3/uL (ref 0.0–0.1)
EOS ABS: 0.1 10*3/uL (ref 0.0–0.5)
EOS%: 1.1 % (ref 0.0–7.0)
HCT: 38.8 % (ref 34.8–46.6)
HGB: 12.4 g/dL (ref 11.6–15.9)
LYMPH%: 17 % (ref 14.0–49.7)
MCH: 28.1 pg (ref 25.1–34.0)
MCHC: 31.9 g/dL (ref 31.5–36.0)
MCV: 88.1 fL (ref 79.5–101.0)
MONO#: 0.9 10*3/uL (ref 0.1–0.9)
MONO%: 10.4 % (ref 0.0–14.0)
NEUT%: 70.3 % (ref 38.4–76.8)
NEUTROS ABS: 6 10*3/uL (ref 1.5–6.5)
PLATELETS: 283 10*3/uL (ref 145–400)
RBC: 4.41 10*6/uL (ref 3.70–5.45)
RDW: 15.6 % — AB (ref 11.2–14.5)
WBC: 8.6 10*3/uL (ref 3.9–10.3)
lymph#: 1.5 10*3/uL (ref 0.9–3.3)

## 2013-11-05 LAB — COMPREHENSIVE METABOLIC PANEL (CC13)
ALBUMIN: 3.1 g/dL — AB (ref 3.5–5.0)
ALT: 13 U/L (ref 0–55)
ANION GAP: 11 meq/L (ref 3–11)
AST: 13 U/L (ref 5–34)
Alkaline Phosphatase: 298 U/L — ABNORMAL HIGH (ref 40–150)
BUN: 7.4 mg/dL (ref 7.0–26.0)
CALCIUM: 9.6 mg/dL (ref 8.4–10.4)
CO2: 28 meq/L (ref 22–29)
Chloride: 102 mEq/L (ref 98–109)
Creatinine: 1.1 mg/dL (ref 0.6–1.1)
Glucose: 235 mg/dl — ABNORMAL HIGH (ref 70–140)
POTASSIUM: 4 meq/L (ref 3.5–5.1)
SODIUM: 141 meq/L (ref 136–145)
TOTAL PROTEIN: 8 g/dL (ref 6.4–8.3)
Total Bilirubin: 1.51 mg/dL — ABNORMAL HIGH (ref 0.20–1.20)

## 2013-11-05 LAB — MAGNESIUM (CC13): Magnesium: 2 mg/dl (ref 1.5–2.5)

## 2013-11-05 MED ORDER — AZITHROMYCIN 250 MG PO TABS: ORAL_TABLET | ORAL | Status: DC

## 2013-11-05 MED ORDER — OCTREOTIDE ACETATE 30 MG IM KIT
30.0000 mg | PACK | Freq: Once | INTRAMUSCULAR | Status: AC
  Administered 2013-11-05: 30 mg via INTRAMUSCULAR
  Filled 2013-11-05: qty 1

## 2013-11-05 NOTE — Telephone Encounter (Signed)
Pt confirmed labs/ov per 10/30 POF, gave pt AVS.... KJ °

## 2013-11-05 NOTE — Progress Notes (Signed)
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   HISTORY OF PRESENTING ILLNESS:   Yesenia Lyons is a 63 y.o. diabetic female who lives in [place], [place]. 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   In review, patient had been seen at the ED on 03/29/2013 with increasing abdominal pain and shortness of breath accompanied by weight loss and fevers. She also had early satiety, intermittent constipation, decreased appetite. Workup included a CT of the abdomen and pelvis without contrast on the same day, revealing a partially calcified mesenteric mass with innumerable metastatic lesions in the liver suspicious for metastatic carcinoid tumor with extends of liver involvement.   Mesenteric Mass below 5.2 x 4.7 x 3.3 cm    Liver lesions Biggest on Right Lobe 5.1 cm seen below       She underwent a CT-guided core biopsy of the left adrenal gland which was essentially non -diagnostic ([id number]). The overall material was very limited and composed of rare atypical cells in a background of blood and fibrin.   She was discharged on 3/26 after symptoms were controlled with HHPT and RN, and was instructed to follow up as outpatient with PCP and GI specialty. She failed to do so. She was admitted on 08/23/2013 with progressive, similar symptoms described above, worsening over the last week, with dull,central abdominal pain, a 30 lb unintentional weight loss, nausea, anorexia, diarrhea, fatigue, weakness, low grade fevers and chills. She is able tolerate some liquids, but no real solid food. She was short of breath, but no chest pain. She denied any GI or GU  bleed. She had no confusion .No other symptoms are reported. A new CT scan on 8/18 showed again a mesenteric mass 4.6 x 3 cm (previously 4.7 x 3 cm) with multiple lesions in the liver, largest 4.9cm x 3.7 cm (stable), left adrenal mass 5.4 x 4.9 cm (previously 4.9 x 4.6 cm) Atelectasis in lung bases bilaterally, cardiomegaly, pelvic floor laxity.aortocaval lymph node 1.5 cm (1.4 cm before). She also had acute renal failure, leukocytosis with WBC of 21.9, and mild anemia with a Hb of 11.7. Her bili was 1.6.    Biopsy of the liver lesion on 8/18 was positive for metastatic well differentiated neuroendocrine tumor (carcinoid)    CXR 08/23/2013 shows Cardiomegaly with mild interstitial prominence. Mild component of congestive heart failure may be present. Interstitial pneumonitis cannot be excluded. These findings are less prominent than on prior chest x-ray 03/29/2013.  Marland Kitchen  The biopsy reveals nests of cells with moderate cytoplasm and round nuclei with neuroendocrine chromatin.There is necrosis. Immunohistochemistry reveals the cells are positive for CD56,chromogranin,synaptophysin, and CDX-2. They are negative for TTF-1. These findings are consistent with a well differentiated neuroendocrine tumor (carcinoid) of gastrointestinal origin.([id number])   Patient has been evaluated by surgery Dr Zella Richer, concluding that patient is not a candidate for resection. Based on these findings we were requested to see the patient in consultation regarding therapeutic options. This appears to be low grade Carcinoid disease of mesentery with liver and adrenal involvement and retroperitoneal adenopathy as well. We have measurable disease which we can follow on subsequent imaging.A 24-HOUR QUANTITATIVE 5 HIAA  IN URINE was 29.9 (h) normal <=6.0 mg/24 h volume of urine 600 ml/24 hrs. Serum LDH was 229 normal. Copper 164 mcg normal, zinc low at 51 mcg, Ferritin 442 high marker of inflammation, serum iron low at 21 microgram,  TIBC 190, FOLATE 8.4, B12 507 pg normal, CHROMOGRANIN A 33 elevated (normal <15 ng), ESR 123 high, ca19-9 23 normal, CEA 1.6 normal, CA125 25 normal.   Patient started systemic therapy with Octreotide (Sandostatin) 30 mg IM once a month and receive her first shot on 09/10/2013. She was also given Iron replacement with FERAHEME on 09/20/13 1,020 mg.   INTERVAL HISTORY:  Patient is experiencing some reproducible chest pain. She was seen in ER on 10/30/13 in evening with chest pain. EKG was fine. Of the three EKGs done 1 of them showed a prolonged QTc interval. Other 2 were fine. I printed her EKGs so she can have her cardiologist review them on 11/15/13. i also checked her serum Magnesium today which came back normal.          She denies any cardiac symptoms now. I told her to start taking a Prilosec OTC and she will ask her PCP to check pneumonia vaccination status and give her the new vaccine if she is due for it. I told her to cut back on the caffeinated beverages she does drink tea different varieties. Patient denies any diarrhea flushing wheezing weight loss loss of appetite. She does have hypertension diabetes congestive heart failure history. She does have sinus congestion and bronchitis symptoms cough so I am calling a script for Z-pak for her today.   MEDICAL HISTORY:  Past Medical History  Diagnosis Date  . Pneumonia   . Renal disorder     renal failure  . Obesity   . Hypertension   . Diabetes mellitus   . High cholesterol   . CHF (congestive heart failure)     SURGICAL HISTORY: Past Surgical History  Procedure Laterality Date  . Abdominal hysterectomy    . Knee reconstruction, medial patellar femoral ligament      SOCIAL HISTORY: History   Social History  . Marital Status: Single    Spouse Name: N/A    Number of Children: N/A  . Years of Education: N/A   Occupational History  . Not on file.   Social History Main Topics  . Smoking status: Never Smoker   .  Smokeless tobacco: Never Used  . Alcohol Use: No  . Drug Use: No  . Sexual Activity: No   Other Topics Concern  . Not on file   Social History Narrative  . No narrative on file   Retired. Originally from [place], [place]. Retired. 1 daughter whom she lives now here in [facility]. Never smoked cigarettes. Denies history of ETOH or recreational drugs.   FAMILY HISTORY:  Patient is adopted, family history is not known, except that she has 1 sister with breast cancer. 1 daughter, 1 grandson 61 years old.   ALLERGIES:  is allergic to penicillins. It causes itching and swelling.  MEDICATIONS:  Current Outpatient Prescriptions  Medication Sig Dispense Refill  . acetaminophen (TYLENOL) 650 MG CR tablet Take 650 mg by mouth every 8 (eight) hours as needed for pain.      Marland Kitchen albuterol (PROVENTIL HFA;VENTOLIN HFA) 108 (90 BASE) MCG/ACT inhaler Inhale 2 puffs into the lungs every 6 (six) hours as needed for wheezing.       Marland Kitchen amLODipine-benazepril (LOTREL) 10-20 MG per capsule Take 1 capsule by  mouth daily.      Marland Kitchen atenolol (TENORMIN) 50 MG tablet Take 50 mg by mouth daily.      . beclomethasone (QVAR) 80 MCG/ACT inhaler Inhale 1 puff into the lungs as needed (sob). breathing      . Cranberry (HM CRANBERRY SUPER STRENGTH) 300 MG tablet Take 300 mg by mouth 2 (two) times daily.      . feeding supplement, ENSURE COMPLETE, (ENSURE COMPLETE) LIQD Take 237 mLs by mouth 2 (two) times daily between meals.  30 Bottle  0  . feeding supplement, GLUCERNA SHAKE, (GLUCERNA SHAKE) LIQD Take 237 mLs by mouth 2 (two) times daily between meals.  30 Can  3  . ferrous gluconate (FERGON) 324 MG tablet Take 324 mg by mouth 2 (two) times daily with a meal.      . furosemide (LASIX) 40 MG tablet Take 1 tablet (40 mg total) by mouth daily.  10 tablet  0  . gabapentin (NEURONTIN) 400 MG capsule Take 400 mg by mouth 3 (three) times daily.      . Ginger, Zingiber officinalis, (GINGER ROOT) 550 MG CAPS Take 1 capsule by mouth  daily.      Marland Kitchen HYDROcodone-acetaminophen (NORCO/VICODIN) 5-325 MG per tablet Take 2 tablets by mouth every 6 (six) hours as needed for moderate pain.  30 tablet  0  . insulin aspart (NOVOLOG) 100 UNIT/ML injection Inject 40-50 Units into the skin 3 (three) times daily before meals. Inject 50 units at breakfast and 40 units at  lunch and 40 units at dinner. Pt on sliding scale      . insulin glargine (LANTUS) 100 UNIT/ML injection Inject 50 Units into the skin 2 (two) times daily. In the morning and at bedtime.      Marland Kitchen LORazepam (ATIVAN) 0.5 MG tablet Take 0.5 mg by mouth at bedtime as needed for anxiety.      . ondansetron (ZOFRAN ODT) 4 MG disintegrating tablet Take 1 tablet (4 mg total) by mouth every 8 (eight) hours as needed for nausea or vomiting.  20 tablet  0  . oxyCODONE-acetaminophen (PERCOCET) 10-325 MG per tablet Take 1 tablet by mouth every 4 (four) hours as needed for pain.  30 tablet  0  . spironolactone (ALDACTONE) 25 MG tablet Take 25 mg by mouth daily.      . vitamin C (ASCORBIC ACID) 500 MG tablet Take 1 tablet (500 mg total) by mouth daily.  30 tablet  5  . Zinc 50 MG TABS Take 1 tablet (50 mg total) by mouth daily.  30 tablet  5  . azithromycin (ZITHROMAX) 250 MG tablet As directed 5 day course z pak  6 each  0   No current facility-administered medications for this visit.    Review of Systems  Constitutional: Positive for malaise/fatigue. Negative for fever, chills, weight loss and diaphoresis.  HENT: Negative for congestion, ear pain, hearing loss, nosebleeds and sore throat.   Eyes: Negative for blurred vision, double vision and pain.  Respiratory: Positive for cough and shortness of breath. Negative for hemoptysis, sputum production and wheezing.   Cardiovascular: Negative for chest pain, palpitations, orthopnea, claudication, leg swelling and PND.  Gastrointestinal: Positive for diarrhea. Negative for heartburn, nausea, vomiting, abdominal pain, constipation and blood in  stool.  Genitourinary: Negative for dysuria and frequency.  Musculoskeletal: Negative for back pain, joint pain, myalgias and neck pain.  Skin: Negative for itching and rash.  Neurological: Positive for weakness. Negative for dizziness, sensory change, focal weakness and  headaches.  Endo/Heme/Allergies: Does not bruise/bleed easily.  Psychiatric/Behavioral: Negative for depression and memory loss. The patient is not nervous/anxious and does not have insomnia.     PHYSICAL EXAMINATION: ECOG PERFORMANCE STATUS: 1, KPS 80  Filed Vitals:   11/05/13 0913  BP: 157/64  Pulse: 52  Temp: 98.3 F (36.8 C)  Resp: 18   Filed Weights   11/05/13 0913  Weight: 332 lb 8 oz (150.821 kg)    Physical Exam  Constitutional: She is oriented to person, place, and time. She appears well-developed and well-nourished.  HENT:  Head: Normocephalic and atraumatic.  Mouth/Throat: Oropharynx is clear and moist.  Eyes: Conjunctivae and EOM are normal. Pupils are equal, round, and reactive to light.  Neck: Normal range of motion. Neck supple.  Cardiovascular: Normal rate, regular rhythm and normal heart sounds.   Pulmonary/Chest: Effort normal and breath sounds normal. She has no wheezes. She has no rales.  Abdominal: Soft. Bowel sounds are normal. She exhibits no mass. There is no tenderness.  Musculoskeletal: Normal range of motion. She exhibits no edema.  Neurological: She is alert and oriented to person, place, and time. She has normal reflexes.  Skin: Skin is warm and dry.  Psychiatric: She has a normal mood and affect. Her behavior is normal. Judgment and thought content normal.    LABORATORY DATA:           RADIOGRAPHIC STUDIES:  I have personally reviewed the radiological images as listed and agreed with the findings in the report. Dg Chest 2 View  08/23/2013   CLINICAL DATA:  Cough and congestion.  Shortness of breath.  EXAM: CHEST  2 VIEW  COMPARISON:  03/29/2013.  10/29/2011.   FINDINGS: Mediastinum and hilar structures are normal. Mild cardiomegaly and pulmonary interstitial prominence. Mild component of congestive heart failure cannot be excluded. Mild pneumonitis cannot be excluded. No pleural effusion or pneumothorax. No acute osseus abnormality.  IMPRESSION: Cardiomegaly with mild interstitial prominence. Mild component of congestive heart failure may be present. Interstitial pneumonitis cannot be excluded. These findings are less prominent than on prior chest x-ray 03/29/2013.   Electronically Signed   By: Marcello Moores  Register   On: 08/23/2013 17:05   US Abdomen Complete  08/23/2013   CLINICAL DATA:  Abdominal pain for 1 week, vomiting, question cholecystitis, history hypertension  EXAM: ULTRASOUND ABDOMEN COMPLETE  COMPARISON:  Ultrasound abdomen 03/31/2013, CT abdomen and pelvis 03/29/2013  FINDINGS: Gallbladder:  Normally distended without stones or wall thickening.  No pericholecystic fluid or sonographic Murphy sign.  Common bile duct:  Diameter: 4 mm diameter, normal  Liver:  Slightly heterogeneous echogenicity. No definite focal mass lesions. Hepatopetal portal venous flow.  IVC:  Normal appearance  Pancreas:  Tail incompletely visualized, visualized portions normal appearance  Spleen:  Normal appearance, 6.6 cm length  Right Kidney:  Length: 10.3 cm.  Normal morphology without mass or hydronephrosis.  Left Kidney:  Length: 11.3 cm.  Normal morphology without mass or hydronephrosis.  Abdominal aorta:  Bifurcation obscured by bowel gas. Visualized portion normal caliber.  Other findings:  No free-fluid  IMPRESSION: Incomplete visualization of distal aorta and pancreatic tail.  Mildly inhomogeneous hepatic echogenicity without discrete mass.  No evidence of cholecystitis or cholelithiasis.   Electronically Signed   By: Lavonia Dana M.D.   On: 08/23/2013 12:26   Ct Abdomen Pelvis W Contrast  08/23/2013   CLINICAL DATA:  Mid abdominal pain.  Vomiting.  EXAM: CT ABDOMEN AND PELVIS  WITH CONTRAST  TECHNIQUE: Multidetector CT  imaging of the abdomen and pelvis was performed using the standard protocol following bolus administration of intravenous contrast.  CONTRAST:  15m OMNIPAQUE IOHEXOL 300 MG/ML SOLN, 1078mOMNIPAQUE IOHEXOL 300 MG/ML SOLN  COMPARISON:  Multiple exams, including 08/23/2013 and 03/29/2013  FINDINGS: Volume loss and hazy opacities in both lower lobes favoring atelectasis over aspiration pneumonitis. Cardiomegaly observed favoring the ventricles.  Abnormal heterogeneous lesions throughout the liver favoring diffuse hepatic metastatic disease. Index peripheral right hepatic lobe lesion 4.9 x 3.7 cm, difficult to measure on the prior exam due the lack of IV contrast but probably the same.  Spleen grossly unremarkable. Left adrenal mass 5.4 by 4.9 cm, previously 4.9 x 4.6 cm.  Mildly progressive porta hepatis sudden adenopathy. An aortocaval node measures 1.5 cm in short axis, previously 1.4 cm.  Mesenteric mass is again noted with some calcification. At the level with the calcification, the mass measures 4.6 x 3.0 cm. When I measure this mass in the same fashion on the prior exam, I get 4.7 x 3.0 cm.  Kidneys and proximal ureters unremarkable. Aside from a mesenteric mass, no pelvic adenopathy is observed.  There is pelvic floor laxity with the urinary bladder extending below the pubococcygeal line and the anorectal junction well below the pubococcygeal line.  IMPRESSION: 1. Partially calcified mesenteric mass with innumerable metastatic lesions in the liver. I am unable to find the pathology report from the prior adrenal biopsy in the EPIC system. 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 Conceivably this could be metastatic lung disease, lymphoma, or other gastrointestinal primary cancer. Correlate with biopsy results. 2. Atelectasis in both lung  bases. 3. Cardiomegaly. 4. Pelvic floor laxity.   Electronically Signed   By: WaSherryl Barters.D.   On: 08/23/2013 14:32   Ct Biopsy  08/24/2013   CLINICAL DATA:  89ear old female with partially calcified mesenteric mass, numerous hepatic lesions and a left adrenal lesion. Overall, the imaging findings are concerning for metastatic carcinoid. The patient underwent percutaneous biopsy of the left adrenal mass on 03/31/2013. The biopsy demonstrated atypical cells but yielded insufficient material for diagnosis. Patient presents for attempted CT-guided biopsy of one of her hepatic lesions.  EXAM: CT BIOPSY  Date: 08/24/2013  PROCEDURE: 1. CT-guided fine-needle aspiration of segment 2 hepatic lesion 2. CT-guided core biopsy of segment 2 hepatic lesion Interventional Radiologist:  HeCriselda PeachesMD  ANESTHESIA/SEDATION: Moderate (conscious) sedation was used. 21 mg Versed, 100 mcg Fentanyl were administered intravenously. The patient's vital signs were monitored continuously by radiology nursing throughout the procedure.  Sedation Time: 34 minutes  TECHNIQUE: Informed consent was obtained from the patient following explanation of the procedure, risks, benefits and alternatives. The patient understands, agrees and consents for the procedure. All questions were addressed. A time out was performed.  A planning axial CT scan was performed. The hypodense lesion in the posterior aspect of hepatic segment tube was identified. A suitable skin entry site was selected and marked. The region was then sterilely prepped and draped in the standard fashion using Betadine skin prep.  Local anesthesia was attained by infiltration with 1% lidocaine. A small dermatotomy was made. Using intermittent CT guidance, a 17 gauge trocar needle was advanced into the left hepatic lobe and positioned at the margin of the mass. Given the patient's prior nondiagnostic biopsy, fine needle aspiration biopsies were initially obtained using a  20 gauge Francine needle. On-site  cytopathologic evaluation confirmed diagnostic material. To facilitate additional immunohistochemical staining, several 18 gauge core biopsies were then coaxially obtained using the BioPince automated biopsy device.  As the 17 gauge trocar needle was removed, the biopsy tract was embolized with a Gel-Foam slurry. Post biopsy axial CT imaging demonstrates no hematoma or complication. The patient tolerated the procedure well.  COMPLICATIONS: None.  IMPRESSION: 1. Technically successful CT-guided fine needle aspiration of left hepatic lesion. 2. CT-guided core biopsy of left hepatic lesion. Signed,  Criselda Peaches, MD  [facility]  [facility]   Electronically Signed   By: Jacqulynn Cadet M.D.   On: 08/24/2013 14:14   CLINICAL DATA: Dull ache. Mild shortness of breath.  EXAM: 10/30/2013 CHEST 2 VIEW  COMPARISON: Chest radiograph 08/23/2013.  FINDINGS:  Lateral view limited, as the patient could not completely raise their arms. Cardiomegaly is stable. The lung volumes are quite low  bilaterally. There is focal atelectasis or scarring at the lateral left lung base. Pulmonary vascularity appears upper normal. No  definite airspace disease fusion. Negative for pneumothorax. Soft tissues of the neck partially obscured lung apices. Degenerative  changes of the thoracic spine.  IMPRESSION:  Lateral views limited by patient on arms. Stable cardiomegaly. Left basilar atelectasis. Otherwise, no acute findings.  Electronically Signed By: Curlene Dolphin M.D. On: 10/30/2013 19:23    ASSESSMENT:   1. Well Differentiated Grade 1 neuroendocrine tumor (Carcinoid) of GI origin with metastasis in liver, left adrenal gland and porta hepatis adenopathy. The overall goal of this therapy is disease control and not curative.  2. We are dealing with a slow growing carcinoid tumor which is positive for neuroendocrine markers. The primary  tumor, right hepatic index lesion and adrenal lesion have not changed much in 4-5 months. I checked a serum Chromogranin level which can serve as a useful prognostic marker if elevated. It was 33 mildly elevated. A 24 hour urine 5 HIAA level was moderately elevated at 89.3 and we can certainly follow that after 3-4 months of therapy.  3. I started her initial treatment outpatient with (Sandostatin) LAR also known as Octreotide single agent therapy 30 mg IM once a month. First shot given on 09/10/2013 and today she gets her third shot. Side effects were discussed and overall it is fairly safe and well tolerated therapy. Although it is advanced stage tumor, patients with Observation alone or octreotide therapy can still survive for a long time. It also has a predilection to go to bones, liver. It can also cause symptoms of flushing, diarrhea, vasomotor symptoms, fatigue, weight loss and right sided heart failure if it involves the Tricuspid valve.  4. For more aggressive tumors, we have other options also available like Xeloda, 5FU, folfox, folfiri regimens, Avastin, Afinitor, temodar+xeloda, option of liver directed therapy with microspheres like Yttrium. Radiotherapy or chemotherapy with streptozotocin, cisplatin, etoposide, and doxorubicin, either alone or in combination, has been used, and reports show some success; a good response occurs in only 20-30% of cases.   5. ONE SHOULD ALSO BE CAREFUL IN PATIENTS LIKE THIS WHERE A SURGICAL PROCEDURE OR EVEN A BIOPSY CAN RESULT IN CARCINOID CRISIS and should be managed by preoperative use of octreotide bolus or octreotide infusion as per protocol and under supervision of an oncologist.   6. Anemia: two identifiable causes were iron deficiency and zinc deficiency. We gave patient FeraHeme infusion on 09/10/2013 and start her on zinc 50 mg daily. She will also use Vitamin C 500 mg po  daily to help with anemia.  7. She was told to use Glucerna cans because of her  diabetes.  8. RTC in 1 month for labs, Sandostatin injection and MD visit.  9. Will ask her Cardiologist to review her EKGs and medications.     All questions were answered. The patient knows to call the clinic with any problems, questions or concerns. I spent 30 minutes counseling the patient face to face. The total time spent in the appointment was 60 minutes and more than 50% was on counseling.    Bernadene Bell, MD Medical Hematologist/Oncologist [place] Pager: [phone] Office No: [phone]

## 2013-11-11 ENCOUNTER — Encounter: Payer: Self-pay | Admitting: Hematology

## 2013-11-11 ENCOUNTER — Encounter: Payer: Self-pay | Admitting: Oncology

## 2013-11-11 NOTE — Progress Notes (Signed)
Put fmla form on nurse's desk °

## 2013-11-11 NOTE — Progress Notes (Signed)
Faxed daughter's fmla form to [phone]

## 2013-11-29 ENCOUNTER — Telehealth: Payer: Self-pay | Admitting: Hematology

## 2013-11-29 NOTE — Telephone Encounter (Signed)
Confirm appt d/t for 12/06/13.

## 2013-12-06 ENCOUNTER — Ambulatory Visit: Payer: Medicare (Managed Care)

## 2013-12-06 ENCOUNTER — Other Ambulatory Visit: Payer: Medicare (Managed Care)

## 2013-12-06 ENCOUNTER — Encounter: Payer: Self-pay | Admitting: Hematology

## 2013-12-06 ENCOUNTER — Other Ambulatory Visit (HOSPITAL_BASED_OUTPATIENT_CLINIC_OR_DEPARTMENT_OTHER): Payer: Medicare (Managed Care)

## 2013-12-06 ENCOUNTER — Ambulatory Visit (HOSPITAL_BASED_OUTPATIENT_CLINIC_OR_DEPARTMENT_OTHER): Payer: Medicare (Managed Care)

## 2013-12-06 ENCOUNTER — Ambulatory Visit (HOSPITAL_BASED_OUTPATIENT_CLINIC_OR_DEPARTMENT_OTHER): Payer: Medicare (Managed Care) | Admitting: Hematology

## 2013-12-06 ENCOUNTER — Telehealth: Payer: Self-pay | Admitting: Hematology

## 2013-12-06 VITALS — Ht 70.0 in | Wt 338.4 lb

## 2013-12-06 DIAGNOSIS — C7B02 Secondary carcinoid tumors of liver: Secondary | ICD-10-CM

## 2013-12-06 DIAGNOSIS — E119 Type 2 diabetes mellitus without complications: Secondary | ICD-10-CM

## 2013-12-06 DIAGNOSIS — I509 Heart failure, unspecified: Secondary | ICD-10-CM

## 2013-12-06 DIAGNOSIS — C7A00 Malignant carcinoid tumor of unspecified site: Secondary | ICD-10-CM

## 2013-12-06 DIAGNOSIS — E340 Carcinoid syndrome: Secondary | ICD-10-CM | POA: Diagnosis not present

## 2013-12-06 DIAGNOSIS — C7B09 Secondary carcinoid tumors of other sites: Secondary | ICD-10-CM

## 2013-12-06 DIAGNOSIS — D3A098 Benign carcinoid tumors of other sites: Secondary | ICD-10-CM

## 2013-12-06 DIAGNOSIS — C7B00 Secondary carcinoid tumors, unspecified site: Secondary | ICD-10-CM

## 2013-12-06 DIAGNOSIS — I10 Essential (primary) hypertension: Secondary | ICD-10-CM

## 2013-12-06 LAB — CBC WITH DIFFERENTIAL/PLATELET
BASO%: 0.9 % (ref 0.0–2.0)
Basophils Absolute: 0.1 10*3/uL (ref 0.0–0.1)
EOS%: 1.5 % (ref 0.0–7.0)
Eosinophils Absolute: 0.1 10*3/uL (ref 0.0–0.5)
HCT: 41.6 % (ref 34.8–46.6)
HGB: 13.3 g/dL (ref 11.6–15.9)
LYMPH#: 1.7 10*3/uL (ref 0.9–3.3)
LYMPH%: 22.2 % (ref 14.0–49.7)
MCH: 28.5 pg (ref 25.1–34.0)
MCHC: 32 g/dL (ref 31.5–36.0)
MCV: 89 fL (ref 79.5–101.0)
MONO#: 0.7 10*3/uL (ref 0.1–0.9)
MONO%: 9 % (ref 0.0–14.0)
NEUT#: 5.1 10*3/uL (ref 1.5–6.5)
NEUT%: 66.4 % (ref 38.4–76.8)
Platelets: 185 10*3/uL (ref 145–400)
RBC: 4.67 10*6/uL (ref 3.70–5.45)
RDW: 15.2 % — AB (ref 11.2–14.5)
WBC: 7.7 10*3/uL (ref 3.9–10.3)

## 2013-12-06 LAB — COMPREHENSIVE METABOLIC PANEL (CC13)
ALBUMIN: 3.5 g/dL (ref 3.5–5.0)
ALK PHOS: 272 U/L — AB (ref 40–150)
ALT: 15 U/L (ref 0–55)
ANION GAP: 11 meq/L (ref 3–11)
AST: 20 U/L (ref 5–34)
BUN: 13.5 mg/dL (ref 7.0–26.0)
CHLORIDE: 103 meq/L (ref 98–109)
CO2: 27 mEq/L (ref 22–29)
Calcium: 9.6 mg/dL (ref 8.4–10.4)
Creatinine: 1 mg/dL (ref 0.6–1.1)
Glucose: 252 mg/dl — ABNORMAL HIGH (ref 70–140)
Potassium: 4.1 mEq/L (ref 3.5–5.1)
SODIUM: 142 meq/L (ref 136–145)
Total Bilirubin: 0.95 mg/dL (ref 0.20–1.20)
Total Protein: 7.5 g/dL (ref 6.4–8.3)

## 2013-12-06 MED ORDER — OCTREOTIDE ACETATE 30 MG IM KIT
30.0000 mg | PACK | Freq: Once | INTRAMUSCULAR | Status: AC
  Administered 2013-12-06: 30 mg via INTRAMUSCULAR
  Filled 2013-12-06: qty 1

## 2013-12-06 NOTE — Telephone Encounter (Signed)
gv and printed appt sched and avs for pt for DEC...gv pt barium

## 2013-12-06 NOTE — Progress Notes (Signed)
[place] FOLLOW UP NOTE Date of Visit: 11/05/2013  Patient Care Team: Yesenia Ginger, PA-C as PCP - General (Cardiology)  ONCOLOGY DIAGNOSIS  Low grade carcinoid tumor, unknown primary site, with mets to liver, left adrenal and mesenteric mets, stage IV, diagnosed on 08/24/13 through liver biopsy.   CURRENT THERAPY:  Octreotide (Sandostatin) 30 mg IM once a month, started on 09/10/13   CHIEF COMPLAIN Follow up  HISTORY OF PRESENTING ILLNESS (from Yesenia. Renella Lyons note):   Patient had been seen at the ED on 03/29/2013 with increasing abdominal pain and shortness of breath accompanied by weight loss and fevers. She also had early satiety, intermittent constipation, decreased appetite. Workup included a CT of the abdomen and pelvis without contrast on the same day, revealing a partially calcified mesenteric mass with innumerable metastatic lesions in the liver suspicious for metastatic carcinoid tumor with extends of liver involvement.   She underwent a CT-guided core biopsy of the left adrenal gland which was essentially non -diagnostic ([id number]). The overall material was very limited and composed of rare atypical cells in a background of blood and fibrin.   She was discharged on 3/26 after symptoms were controlled with HHPT and RN, and was instructed to follow up as outpatient with PCP and GI specialty. She failed to do so. She was admitted on 08/23/2013 with progressive, similar symptoms described above, worsening over the last week, with dull,central abdominal pain, a 30 lb unintentional weight loss, nausea, anorexia, diarrhea, fatigue, weakness, low grade fevers and chills. She is able tolerate some liquids, but no real solid food. She was short of breath, but no chest pain. She denied any GI or GU bleed. She had no confusion .No other symptoms are reported. A new CT scan on 8/18 showed again a mesenteric mass 4.6 x 3 cm (previously 4.7 x 3 cm) with multiple  lesions in the liver, largest 4.9cm x 3.7 cm (stable), left adrenal mass 5.4 x 4.9 cm (previously 4.9 x 4.6 cm) Atelectasis in lung bases bilaterally, cardiomegaly, pelvic floor laxity.aortocaval lymph node 1.5 cm (1.4 cm before). She also had acute renal failure, leukocytosis with WBC of 21.9, and mild anemia with a Hb of 11.7. Her bili was 1.6.    Biopsy of the liver lesion on 8/18 was positive for metastatic well differentiated neuroendocrine tumor (carcinoid)    CXR 08/23/2013 shows Cardiomegaly with mild interstitial prominence. Mild component of congestive heart failure may be present. Interstitial pneumonitis cannot be excluded. These findings are less prominent than on prior chest x-ray 03/29/2013.  Marland Kitchen  The biopsy reveals nests of cells with moderate cytoplasm and round nuclei with neuroendocrine chromatin.There is necrosis. Immunohistochemistry reveals the cells are positive for CD56,chromogranin,synaptophysin, and CDX-2. They are negative for TTF-1. These findings are consistent with a well differentiated neuroendocrine tumor (carcinoid) of gastrointestinal origin.([id number])   Patient has been evaluated by surgery Yesenia Lyons, concluding that patient is not a candidate for resection. Based on these findings we were requested to see the patient in consultation regarding therapeutic options. This appears to be low grade Carcinoid disease of mesentery with liver and adrenal involvement and retroperitoneal adenopathy as well. We have measurable disease which we can follow on subsequent imaging.A 24-HOUR QUANTITATIVE 5 HIAA IN URINE was 29.9 (h) normal <=6.0 mg/24 h volume of urine 600 ml/24 hrs. Serum LDH was 229 normal. Copper 164 mcg normal, zinc low at 51 mcg, Ferritin 442 high marker of inflammation, serum iron low at 21 microgram, TIBC  190, FOLATE 8.4, B12 507 pg normal, CHROMOGRANIN A 33 elevated (normal <15 ng), ESR 123 high, ca19-9 23 normal, CEA 1.6 normal, CA125 25 normal.   Patient  started systemic therapy with Octreotide (Sandostatin) 30 mg IM once a month and receive her first shot on 09/10/2013. She was also given Iron replacement with FERAHEME on 09/20/13 1,020 mg.   INTERVAL HISTORY: Yesenia Lyons returns for follow-up. She was previously seen by Yesenia Lyons, who has left the practice. She came in in a wheelchair with her daughter. She states she feels slightly better since her started octreotide injection. She still has moderate fatigue, able to do most of self care, but not much else activity, she has dyspnea on mild to moderate exertion. No chest pain, abdominal discomfort, or other new symptoms.   MEDICAL HISTORY:  Past Medical History  Diagnosis Date  . Pneumonia   . Renal disorder     renal failure  . Obesity   . Hypertension   . Diabetes mellitus   . High cholesterol   . CHF (congestive heart failure)     SURGICAL HISTORY: Past Surgical History  Procedure Laterality Date  . Abdominal hysterectomy    . Knee reconstruction, medial patellar femoral ligament      SOCIAL HISTORY: History   Social History  . Marital Status: Single    Spouse Name: N/A    Number of Children: N/A  . Years of Education: N/A   Occupational History  . Not on file.   Social History Main Topics  . Smoking status: Never Smoker   . Smokeless tobacco: Never Used  . Alcohol Use: No  . Drug Use: No  . Sexual Activity: No   Other Topics Concern  . Not on file   Social History Narrative  . No narrative on file   Retired. Originally from [place], [place]. Retired. 1 daughter whom she lives now here in [facility]. Never smoked cigarettes. Denies history of ETOH or recreational drugs.   FAMILY HISTORY:  Patient is adopted, family history is not known, except that she has 1 sister with breast cancer. 1 daughter, 1 grandson 12 years old.   ALLERGIES:  is allergic to penicillins. It causes itching and swelling.  MEDICATIONS:  Current Outpatient Prescriptions  Medication Sig  Dispense Refill  . acetaminophen (TYLENOL) 650 MG CR tablet Take 650 mg by mouth every 8 (eight) hours as needed for pain.    Marland Kitchen albuterol (PROVENTIL HFA;VENTOLIN HFA) 108 (90 BASE) MCG/ACT inhaler Inhale 2 puffs into the lungs every 6 (six) hours as needed for wheezing.     Marland Kitchen amLODipine-benazepril (LOTREL) 5-10 MG per capsule Take 1 capsule by mouth daily.    Marland Kitchen atenolol (TENORMIN) 25 MG tablet Take by mouth daily.    . beclomethasone (QVAR) 80 MCG/ACT inhaler Inhale 1 puff into the lungs as needed (sob). breathing    . feeding supplement, ENSURE COMPLETE, (ENSURE COMPLETE) LIQD Take 237 mLs by mouth 2 (two) times daily between meals. 30 Bottle 0  . furosemide (LASIX) 40 MG tablet Take 1 tablet (40 mg total) by mouth daily. 10 tablet 0  . gabapentin (NEURONTIN) 400 MG capsule Take 400 mg by mouth 3 (three) times daily.    . insulin aspart (NOVOLOG) 100 UNIT/ML injection Inject 40-50 Units into the skin 3 (three) times daily before meals. Inject 50 units at breakfast and 40 units at  lunch and 40 units at dinner. Pt on sliding scale    . insulin glargine (LANTUS)  100 UNIT/ML injection Inject 50 Units into the skin 2 (two) times daily. In the morning and at bedtime.    . ondansetron (ZOFRAN ODT) 4 MG disintegrating tablet Take 1 tablet (4 mg total) by mouth every 8 (eight) hours as needed for nausea or vomiting. 20 tablet 0  . oxyCODONE-acetaminophen (PERCOCET) 10-325 MG per tablet Take 1 tablet by mouth every 4 (four) hours as needed for pain. 30 tablet 0  . spironolactone (ALDACTONE) 25 MG tablet Take 25 mg by mouth daily.    . vitamin C (ASCORBIC ACID) 500 MG tablet Take 1 tablet (500 mg total) by mouth daily. 30 tablet 5  . Zinc 50 MG TABS Take 1 tablet (50 mg total) by mouth daily. 30 tablet 5  . Cranberry (HM CRANBERRY SUPER STRENGTH) 300 MG tablet Take 300 mg by mouth 2 (two) times daily.    . ferrous gluconate (FERGON) 324 MG tablet Take 324 mg by mouth 2 (two) times daily with a meal.    .  Lyons, Zingiber officinalis, (Lyons ROOT) 550 MG CAPS Take 1 capsule by mouth daily.    Marland Kitchen HYDROcodone-acetaminophen (NORCO/VICODIN) 5-325 MG per tablet Take 2 tablets by mouth every 6 (six) hours as needed for moderate pain. (Patient not taking: Reported on 12/06/2013) 30 tablet 0  . LORazepam (ATIVAN) 0.5 MG tablet Take 0.5 mg by mouth at bedtime as needed for anxiety.     No current facility-administered medications for this visit.    Review of Systems  Constitutional: Positive for malaise/fatigue. Negative for fever, chills, weight loss and diaphoresis.  HENT: Negative for congestion, ear pain, hearing loss, nosebleeds and sore throat.   Eyes: Negative for blurred vision, double vision and pain.  Respiratory: Positive for cough and shortness of breath. Negative for hemoptysis, sputum production and wheezing.   Cardiovascular: Negative for chest pain, palpitations, orthopnea, claudication, leg swelling and PND.  Gastrointestinal: Positive for diarrhea. Negative for heartburn, nausea, vomiting, abdominal pain, constipation and blood in stool.  Genitourinary: Negative for dysuria and frequency.  Musculoskeletal: Negative for myalgias, back pain, joint pain and neck pain.  Skin: Negative for itching and rash.  Neurological: Positive for weakness. Negative for dizziness, sensory change, focal weakness and headaches.  Endo/Heme/Allergies: Does not bruise/bleed easily.  Psychiatric/Behavioral: Negative for depression and memory loss. The patient is not nervous/anxious and does not have insomnia.     PHYSICAL EXAMINATION: ECOG PERFORMANCE STATUS: 1, KPS 80  There were no vitals filed for this visit. Filed Weights   12/06/13 0851  Weight: 338 lb 6.4 oz (153.497 kg)    Physical Exam  Constitutional: She is oriented to person, place, and time. She appears well-developed and well-nourished.  HENT:  Head: Normocephalic and atraumatic.  Mouth/Throat: Oropharynx is clear and moist.  Eyes:  Conjunctivae and EOM are normal. Pupils are equal, round, and reactive to light.  Neck: Normal range of motion. Neck supple.  Cardiovascular: Normal rate, regular rhythm and normal heart sounds.   Pulmonary/Chest: Effort normal and breath sounds normal. She has no wheezes. She has no rales.  Abdominal: Soft. Bowel sounds are normal. She exhibits no mass. There is no tenderness.  Musculoskeletal: Normal range of motion. She exhibits no edema.  Neurological: She is alert and oriented to person, place, and time. She has normal reflexes.  Skin: Skin is warm and dry.  Psychiatric: She has a normal mood and affect. Her behavior is normal. Judgment and thought content normal.    LABORATORY DATA:  Results for Yesenia Lyons,  Yesenia Lyons (MRN [id number]) as of 12/06/2013 22:16  Ref. Range 11/01/2013 10:18 11/05/2013 08:52 11/05/2013 08:52 11/05/2013 10:03 12/06/2013 08:16  Sodium Latest Range: 136-145 mEq/L  141   142  Potassium Latest Range: 3.5-5.1 mEq/L  4.0   4.1  Chloride Latest Range: 96-112 mEq/L  102   103  CO2 Latest Range: 19-32 mEq/L  28   27  BUN Latest Range: 7.0-26.0 mg/dL  7.4   13.5  Creatinine Latest Range: 0.50-1.10 mg/dL  1.1   1.0  Calcium Latest Range: 8.4-10.5 mg/dL  9.6   9.6  Glucose Latest Range: 70-99 mg/dL  235 (H)   252 (H)  Anion gap Latest Range: 5-$RemoveBeforeD'15   11   11  '[id number]$ Magnesium Latest Range: 1.5-2.5 mg/dl    2.0   Alkaline Phosphatase Latest Range: 39-117 U/L  298 (H)   272 (H)  Albumin Latest Range: 3.5-5.2 g/dL  3.1 (L)   3.5  AST Latest Range: 0-37 U/L  13   20  ALT Latest Range: 0-35 U/L  13   15  Total Protein Latest Range: 6.4-8.3 g/dL  8.0   7.5  Total Bilirubin Latest Range: 0.3-1.2 mg/dL  1.51 (H)   0.95  WBC Latest Range: 4.0-10.5 K/uL   8.6  7.7  RBC Latest Range: 3.87-5.11 MIL/uL   4.41  4.67  Hemoglobin Latest Range: 12.0-15.0 g/dL   12.4  13.3  HCT Latest Range: 36.0-46.0 %   38.8  41.6  MCV Latest Range: 78.0-100.0 fL   88.1  89.0  MCH Latest Range: 26.0-34.0 pg    28.1  28.5  MCHC Latest Range: 30.0-36.0 g/dL   31.9  32.0  RDW Latest Range: 11.5-15.5 %   15.6 (H)  15.2 (H)  Platelets Latest Range: 150-400 K/uL   283  185  NEUT% Latest Range: 38.4-76.8 %   70.3  66.4  LYMPH% Latest Range: 14.0-49.7 %   17.0  22.2  MONO% Latest Range: 0.0-14.0 %   10.4  9.0  EOS% Latest Range: 0.0-7.0 %   1.1  1.5  BASO% Latest Range: 0.0-2.0 %   1.2  0.9  NEUT# Latest Range: 1.7-7.7 K/uL   6.0  5.1  MONO# Latest Range: 0.1-0.9 10e3/uL   0.9  0.7  Eosinophils Absolute Latest Range: 0.0-0.5 10e3/uL   0.1  0.1  Basophils Absolute Latest Range: 0.0-0.1 K/uL   0.1  0.1  lymph# Latest Range: 0.9-3.3 10e3/uL   1.5  1.7  EKG No range found [place]          RADIOGRAPHIC STUDIES: No new scans since last visit    ASSESSMENT:  63 year old female, with history of morbid obese, diabetes, hypertension, congestive heart failure, who was diagnosed with metastases to liver, mesentery and left adrenal gland.  1. 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  -The overall goal of this therapy is disease control and not curative. -She is tolerating Sandostatin well, without significant side effects. She has received her 4th dose today. I recommend to repeat a restaging pan CT scan, including CT chest which was not done at the diagnosis, to evaluate her disease status. I told her Sandostatin is not likely going to shrink the tumor significantly, but more likely stabilize the disease and help her with the symptoms.  2. Diabetes, hypertension, history of heart failure -She will continue follow-up with her primary care physician.   Plan Obtain CT chest, abdomen, pelvis with IV  contrast before next visit. Return to clinic in 4 weeks for Sandostatin injection and follow-up. I'll repeat her chromogranin A every 3 months. CBC and CMP every months.  All questions were answered. The  patient knows to call the clinic with any problems, questions or concerns. I spent 20 minutes counseling the patient face to face. The total time spent in the appointment was 30 minutes and more than 50% was on counseling.    Truitt Merle  12/06/2013

## 2013-12-08 LAB — CHROMOGRANIN A: CHROMOGRANIN A: 39 ng/mL — AB (ref <=15)

## 2013-12-27 ENCOUNTER — Ambulatory Visit (HOSPITAL_BASED_OUTPATIENT_CLINIC_OR_DEPARTMENT_OTHER): Payer: Medicare (Managed Care) | Admitting: Lab

## 2013-12-27 ENCOUNTER — Encounter (HOSPITAL_COMMUNITY): Payer: Self-pay

## 2013-12-27 ENCOUNTER — Ambulatory Visit (HOSPITAL_COMMUNITY)
Admission: RE | Admit: 2013-12-27 | Discharge: 2013-12-27 | Disposition: A | Discharge status: Home / Self Care | Payer: 59 | Source: Ambulatory Visit | Attending: Hematology | Admitting: Hematology

## 2013-12-27 DIAGNOSIS — D369 Benign neoplasm, unspecified site: Secondary | ICD-10-CM | POA: Insufficient documentation

## 2013-12-27 DIAGNOSIS — D509 Iron deficiency anemia, unspecified: Secondary | ICD-10-CM

## 2013-12-27 DIAGNOSIS — C7A00 Malignant carcinoid tumor of unspecified site: Secondary | ICD-10-CM

## 2013-12-27 DIAGNOSIS — D3A098 Benign carcinoid tumors of other sites: Secondary | ICD-10-CM

## 2013-12-27 DIAGNOSIS — C7B02 Secondary carcinoid tumors of liver: Secondary | ICD-10-CM

## 2013-12-27 LAB — COMPREHENSIVE METABOLIC PANEL (CC13)
ALBUMIN: 3.2 g/dL — AB (ref 3.5–5.0)
ALK PHOS: 380 U/L — AB (ref 40–150)
ALT: 19 U/L (ref 0–55)
ANION GAP: 12 meq/L — AB (ref 3–11)
AST: 19 U/L (ref 5–34)
BILIRUBIN TOTAL: 0.76 mg/dL (ref 0.20–1.20)
BUN: 15.2 mg/dL (ref 7.0–26.0)
CALCIUM: 9.2 mg/dL (ref 8.4–10.4)
CHLORIDE: 102 meq/L (ref 98–109)
CO2: 25 meq/L (ref 22–29)
CREATININE: 1 mg/dL (ref 0.6–1.1)
EGFR: 69 mL/min/{1.73_m2} — ABNORMAL LOW (ref >90)
Glucose: 208 mg/dl — ABNORMAL HIGH (ref 70–140)
POTASSIUM: 3.9 meq/L (ref 3.5–5.1)
Sodium: 139 mEq/L (ref 136–145)
Total Protein: 7.8 g/dL (ref 6.4–8.3)

## 2013-12-27 LAB — CBC WITH DIFFERENTIAL/PLATELET
BASO%: 0.8 % (ref 0.0–2.0)
BASOS ABS: 0.1 10*3/uL (ref 0.0–0.1)
EOS ABS: 0.1 10*3/uL (ref 0.0–0.5)
EOS%: 1.2 % (ref 0.0–7.0)
HCT: 39.7 % (ref 34.8–46.6)
HGB: 12.4 g/dL (ref 11.6–15.9)
LYMPH%: 22.3 % (ref 14.0–49.7)
MCH: 28.1 pg (ref 25.1–34.0)
MCHC: 31.4 g/dL — ABNORMAL LOW (ref 31.5–36.0)
MCV: 89.7 fL (ref 79.5–101.0)
MONO#: 0.6 10*3/uL (ref 0.1–0.9)
MONO%: 6.6 % (ref 0.0–14.0)
NEUT#: 6.2 10*3/uL (ref 1.5–6.5)
NEUT%: 69.1 % (ref 38.4–76.8)
PLATELETS: 383 10*3/uL (ref 145–400)
RBC: 4.42 10*6/uL (ref 3.70–5.45)
RDW: 14 % (ref 11.2–14.5)
WBC: 9 10*3/uL (ref 3.9–10.3)
lymph#: 2 10*3/uL (ref 0.9–3.3)

## 2013-12-27 MED ORDER — IOHEXOL 300 MG/ML  SOLN
100.0000 mL | Freq: Once | INTRAMUSCULAR | Status: AC | PRN
  Administered 2013-12-27: 100 mL via INTRAVENOUS

## 2013-12-27 MED ORDER — IOHEXOL 300 MG/ML  SOLN: 100.0000 mL | Freq: Once | INTRAMUSCULAR | Status: AC | PRN

## 2013-12-30 LAB — CHROMOGRANIN A: Chromogranin A: 36 ng/mL — ABNORMAL HIGH (ref <=15)

## 2014-01-03 ENCOUNTER — Ambulatory Visit (HOSPITAL_BASED_OUTPATIENT_CLINIC_OR_DEPARTMENT_OTHER): Payer: Medicare (Managed Care)

## 2014-01-03 ENCOUNTER — Telehealth: Payer: Self-pay | Admitting: Hematology

## 2014-01-03 ENCOUNTER — Encounter: Payer: Self-pay | Admitting: Hematology

## 2014-01-03 ENCOUNTER — Ambulatory Visit (HOSPITAL_BASED_OUTPATIENT_CLINIC_OR_DEPARTMENT_OTHER): Payer: Medicare (Managed Care) | Admitting: Hematology

## 2014-01-03 VITALS — BP 181/62 | HR 51 | Temp 98.0°F | Resp 22 | Ht 70.0 in | Wt 344.1 lb

## 2014-01-03 DIAGNOSIS — E340 Carcinoid syndrome: Secondary | ICD-10-CM

## 2014-01-03 DIAGNOSIS — C7400 Malignant neoplasm of cortex of unspecified adrenal gland: Secondary | ICD-10-CM

## 2014-01-03 DIAGNOSIS — C7B02 Secondary carcinoid tumors of liver: Secondary | ICD-10-CM

## 2014-01-03 DIAGNOSIS — D3A098 Benign carcinoid tumors of other sites: Secondary | ICD-10-CM

## 2014-01-03 DIAGNOSIS — C7A00 Malignant carcinoid tumor of unspecified site: Secondary | ICD-10-CM

## 2014-01-03 DIAGNOSIS — C7B09 Secondary carcinoid tumors of other sites: Secondary | ICD-10-CM

## 2014-01-03 DIAGNOSIS — R630 Anorexia: Secondary | ICD-10-CM

## 2014-01-03 DIAGNOSIS — R0602 Shortness of breath: Secondary | ICD-10-CM

## 2014-01-03 DIAGNOSIS — R531 Weakness: Secondary | ICD-10-CM

## 2014-01-03 MED ORDER — OCTREOTIDE ACETATE 30 MG IM KIT
30.0000 mg | PACK | Freq: Once | INTRAMUSCULAR | Status: AC
  Administered 2014-01-03: 30 mg via INTRAMUSCULAR
  Filled 2014-01-03: qty 1

## 2014-01-03 NOTE — Patient Instructions (Signed)

## 2014-01-03 NOTE — Progress Notes (Signed)
[place] FOLLOW UP NOTE Date of Visit: 11/05/2013  Patient Care Team: Secundino Ginger, PA-C as PCP - General (Cardiology)  ONCOLOGY DIAGNOSIS  Low grade carcinoid tumor, unknown primary site, with mets to liver, left adrenal and mesenteric mets, stage IV, diagnosed on 08/24/13 through liver biopsy.   CURRENT THERAPY:  Octreotide (Sandostatin) 30 mg IM once a month, started on 09/10/13   CHIEF COMPLAIN Follow up  HISTORY OF PRESENTING ILLNESS (from Dr. Renella Cunas note):   Patient had been seen at the ED on 03/29/2013 with increasing abdominal pain and shortness of breath accompanied by weight loss and fevers. She also had early satiety, intermittent constipation, decreased appetite. Workup included a CT of the abdomen and pelvis without contrast on the same day, revealing a partially calcified mesenteric mass with innumerable metastatic lesions in the liver suspicious for metastatic carcinoid tumor with extends of liver involvement.   She underwent a CT-guided core biopsy of the left adrenal gland which was essentially non -diagnostic ([id number]). The overall material was very limited and composed of rare atypical cells in a background of blood and fibrin.   She was discharged on 3/26 after symptoms were controlled with HHPT and RN, and was instructed to follow up as outpatient with PCP and GI specialty. She failed to do so. She was admitted on 08/23/2013 with progressive, similar symptoms described above, worsening over the last week, with dull,central abdominal pain, a 30 lb unintentional weight loss, nausea, anorexia, diarrhea, fatigue, weakness, low grade fevers and chills. She is able tolerate some liquids, but no real solid food. She was short of breath, but no chest pain. She denied any GI or GU bleed. She had no confusion .No other symptoms are reported. A new CT scan on 8/18 showed again a mesenteric mass 4.6 x 3 cm (previously 4.7 x 3 cm) with multiple  lesions in the liver, largest 4.9cm x 3.7 cm (stable), left adrenal mass 5.4 x 4.9 cm (previously 4.9 x 4.6 cm) Atelectasis in lung bases bilaterally, cardiomegaly, pelvic floor laxity.aortocaval lymph node 1.5 cm (1.4 cm before). She also had acute renal failure, leukocytosis with WBC of 21.9, and mild anemia with a Hb of 11.7. Her bili was 1.6.    Biopsy of the liver lesion on 8/18 was positive for metastatic well differentiated neuroendocrine tumor (carcinoid)    CXR 08/23/2013 shows Cardiomegaly with mild interstitial prominence. Mild component of congestive heart failure may be present. Interstitial pneumonitis cannot be excluded. These findings are less prominent than on prior chest x-ray 03/29/2013.  Marland Kitchen  The biopsy reveals nests of cells with moderate cytoplasm and round nuclei with neuroendocrine chromatin.There is necrosis. Immunohistochemistry reveals the cells are positive for CD56,chromogranin,synaptophysin, and CDX-2. They are negative for TTF-1. These findings are consistent with a well differentiated neuroendocrine tumor (carcinoid) of gastrointestinal origin.([id number])   Patient has been evaluated by surgery Dr Zella Richer, concluding that patient is not a candidate for resection. Based on these findings we were requested to see the patient in consultation regarding therapeutic options. This appears to be low grade Carcinoid disease of mesentery with liver and adrenal involvement and retroperitoneal adenopathy as well. We have measurable disease which we can follow on subsequent imaging.A 24-HOUR QUANTITATIVE 5 HIAA IN URINE was 29.9 (h) normal <=6.0 mg/24 h volume of urine 600 ml/24 hrs. Serum LDH was 229 normal. Copper 164 mcg normal, zinc low at 51 mcg, Ferritin 442 high marker of inflammation, serum iron low at 21 microgram, TIBC  190, FOLATE 8.4, B12 507 pg normal, CHROMOGRANIN A 33 elevated (normal <15 ng), ESR 123 high, ca19-9 23 normal, CEA 1.6 normal, CA125 25 normal.   Patient  started systemic therapy with Octreotide (Sandostatin) 30 mg IM once a month and receive her first shot on 09/10/2013. She was also given Iron replacement with FERAHEME on 09/20/13 1,020 mg.   INTERVAL HISTORY: Ms Yesenia Lyons returns for follow-up. She states she feels with the same. She came in in a wheelchair with her daughter.  She still has moderate fatigue, able to do most of self care, but very limited other physical activities. She has mild dyspnea on exertion (walking), no chest pain, mild intermittent cough with small sputum production, no fever or chills.  No abdominal discomfort or diarrhea, or other new symptoms. Her appetite is moderate to low, she some times does not eat much, she drinks Ensure 1-2 bottle a day. Her daughter reports she has intermittent short term memory loss, no other neuro symptoms.    MEDICAL HISTORY:  Past Medical History  Diagnosis Date  . Pneumonia   . Renal disorder     renal failure  . Obesity   . Hypertension   . Diabetes mellitus   . High cholesterol   . CHF (congestive heart failure)     SURGICAL HISTORY: Past Surgical History  Procedure Laterality Date  . Abdominal hysterectomy    . Knee reconstruction, medial patellar femoral ligament      SOCIAL HISTORY: History   Social History  . Marital Status: Single    Spouse Name: N/A    Number of Children: N/A  . Years of Education: N/A   Occupational History  . Not on file.   Social History Main Topics  . Smoking status: Never Smoker   . Smokeless tobacco: Never Used  . Alcohol Use: No  . Drug Use: No  . Sexual Activity: No   Other Topics Concern  . Not on file   Social History Narrative   Retired. Originally from [place], [place]. Retired. 1 daughter whom she lives now here in [facility]. Never smoked cigarettes. Denies history of ETOH or recreational drugs.   FAMILY HISTORY:  Patient is adopted, family history is not known, except that she has 1 sister with breast cancer. 1 daughter, 1  grandson 41 years old.   ALLERGIES:  is allergic to penicillins. It causes itching and swelling.  MEDICATIONS:  Current Outpatient Prescriptions  Medication Sig Dispense Refill  . acetaminophen (TYLENOL) 650 MG CR tablet Take 650 mg by mouth every 8 (eight) hours as needed for pain.    Marland Kitchen albuterol (PROVENTIL HFA;VENTOLIN HFA) 108 (90 BASE) MCG/ACT inhaler Inhale 2 puffs into the lungs every 6 (six) hours as needed for wheezing.     Marland Kitchen amLODipine-benazepril (LOTREL) 5-10 MG per capsule Take 1 capsule by mouth daily.    Marland Kitchen atenolol (TENORMIN) 25 MG tablet Take by mouth daily.    . beclomethasone (QVAR) 80 MCG/ACT inhaler Inhale 1 puff into the lungs as needed (sob). breathing    . Cranberry (HM CRANBERRY SUPER STRENGTH) 300 MG tablet Take 300 mg by mouth 2 (two) times daily.    . feeding supplement, ENSURE COMPLETE, (ENSURE COMPLETE) LIQD Take 237 mLs by mouth 2 (two) times daily between meals. 30 Bottle 0  . ferrous gluconate (FERGON) 324 MG tablet Take 324 mg by mouth 2 (two) times daily with a meal.    . furosemide (LASIX) 40 MG tablet Take 1 tablet (40 mg  total) by mouth daily. 10 tablet 0  . gabapentin (NEURONTIN) 400 MG capsule Take 400 mg by mouth 3 (three) times daily.    . Ginger, Zingiber officinalis, (GINGER ROOT) 550 MG CAPS Take 1 capsule by mouth daily.    Marland Kitchen HYDROcodone-acetaminophen (NORCO/VICODIN) 5-325 MG per tablet Take 2 tablets by mouth every 6 (six) hours as needed for moderate pain. (Patient not taking: Reported on 12/06/2013) 30 tablet 0  . insulin aspart (NOVOLOG) 100 UNIT/ML injection Inject 40-50 Units into the skin 3 (three) times daily before meals. Inject 50 units at breakfast and 40 units at  lunch and 40 units at dinner. Pt on sliding scale    . insulin glargine (LANTUS) 100 UNIT/ML injection Inject 50 Units into the skin 2 (two) times daily. In the morning and at bedtime.    Marland Kitchen LORazepam (ATIVAN) 0.5 MG tablet Take 0.5 mg by mouth at bedtime as needed for anxiety.      . ondansetron (ZOFRAN ODT) 4 MG disintegrating tablet Take 1 tablet (4 mg total) by mouth every 8 (eight) hours as needed for nausea or vomiting. 20 tablet 0  . oxyCODONE-acetaminophen (PERCOCET) 10-325 MG per tablet Take 1 tablet by mouth every 4 (four) hours as needed for pain. 30 tablet 0  . spironolactone (ALDACTONE) 25 MG tablet Take 25 mg by mouth daily.    . vitamin C (ASCORBIC ACID) 500 MG tablet Take 1 tablet (500 mg total) by mouth daily. 30 tablet 5  . Zinc 50 MG TABS Take 1 tablet (50 mg total) by mouth daily. 30 tablet 5   No current facility-administered medications for this visit.    Review of Systems  Constitutional: Positive for malaise/fatigue. Negative for fever, chills, weight loss and diaphoresis.  HENT: Negative for congestion, ear pain, hearing loss, nosebleeds and sore throat.   Eyes: Negative for blurred vision, double vision and pain.  Respiratory: Positive for cough and shortness of breath. Negative for hemoptysis, sputum production and wheezing.   Cardiovascular: Negative for chest pain, palpitations, orthopnea, claudication, leg swelling and PND.  Gastrointestinal: Positive for diarrhea. Negative for heartburn, nausea, vomiting, abdominal pain, constipation and blood in stool.  Genitourinary: Negative for dysuria and frequency.  Musculoskeletal: Negative for myalgias, back pain, joint pain and neck pain.  Skin: Negative for itching and rash.  Neurological: Positive for weakness. Negative for dizziness, sensory change, focal weakness and headaches.  Endo/Heme/Allergies: Does not bruise/bleed easily.  Psychiatric/Behavioral: Negative for depression and memory loss. The patient is not nervous/anxious and does not have insomnia.     PHYSICAL EXAMINATION: ECOG PERFORMANCE STATUS: 1, KPS 80  There were no vitals filed for this visit. There were no vitals filed for this visit.  Physical Exam  Constitutional: She is oriented to person, place, and time. She  appears well-developed and well-nourished.  HENT:  Head: Normocephalic and atraumatic.  Mouth/Throat: Oropharynx is clear and moist.  Eyes: Conjunctivae and EOM are normal. Pupils are equal, round, and reactive to light.  Neck: Normal range of motion. Neck supple.  Cardiovascular: Normal rate, regular rhythm and normal heart sounds.   Pulmonary/Chest: Effort normal and breath sounds normal. She has no wheezes. She has no rales.  Abdominal: Soft. Bowel sounds are normal. She exhibits no mass. There is no tenderness.  Musculoskeletal: Normal range of motion. She exhibits no edema.  Neurological: She is alert and oriented to person, place, and time. She has normal reflexes.  Skin: Skin is warm and dry.  Psychiatric: She has a  normal mood and affect. Her behavior is normal. Judgment and thought content normal.    LABORATORY DATA:  CBC Latest Ref Rng 12/27/2013 12/06/2013 11/05/2013  WBC 3.9 - 10.3 10e3/uL 9.0 7.7 8.6  Hemoglobin 11.6 - 15.9 g/dL 12.4 13.3 12.4  Hematocrit 34.8 - 46.6 % 39.7 41.6 38.8  Platelets 145 - 400 10e3/uL 383 185 283    CMP Latest Ref Rng 12/27/2013 12/06/2013 11/05/2013  Glucose 70 - 140 mg/dl 208(H) 252(H) 235(H)  BUN 7.0 - 26.0 mg/dL 15.2 13.5 7.4  Creatinine 0.6 - 1.1 mg/dL 1.0 1.0 1.1  Sodium 136 - 145 mEq/L 139 142 141  Potassium 3.5 - 5.1 mEq/L 3.9 4.1 4.0  Chloride 96 - 112 mEq/L - - -  CO2 22 - 29 mEq/L $Remove'25 27 28  'lKWyzfH$ Calcium 8.4 - 10.4 mg/dL 9.2 9.6 9.6  Total Protein 6.4 - 8.3 g/dL 7.8 7.5 8.0  Total Bilirubin 0.20 - 1.20 mg/dL 0.76 0.95 1.51(H)  Alkaline Phos 40 - 150 U/L 380(H) 272(H) 298(H)  AST 5 - 34 U/L $Remo'19 20 13  'xYker$ ALT 0 - 55 U/L $Remo'19 15 13   'cUfyS$ Chromogranin A (Order [id number])      Chromogranin A  Status: Finalresult Visible to patient:  Not Released Nextappt: 01/31/2014 at 08:30 AM in Oncology ([facility]) Dx:  Carcinoid tumor of abdomen              Ref Range 7d ago  4wk ago  54mo ago     Chromogranin A <=15  ng/mL 36 (H) 39 (H)CM 33 (H)CM         RADIOGRAPHIC STUDIES: CT chest/abd/pelvis 12/21/20154IMPRESSION: 1. No acute findings within the chest abdomen or pelvis. 2. No evidence for thoracic metastasis. 3. Multi focal liver metastasis are difficult to identify on today's exam. This may reflect diminished exam detail due to patient's body habitus, differences in technique, and/or differences in phase of contrast opacification. When compared with the previous exam the dominant lesion in the right hepatic lobe measures smaller than on the prior study. 4. Partially calcified lesion within the mesenteric is slightly decreased in the interval. 5. No evidence for thoracic metastasis.   ASSESSMENT:  63 year old female, with history of morbid obese, diabetes, hypertension, congestive heart failure, who was diagnosed with metastases to liver, mesentery and left adrenal gland.  1. 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  -The overall goal of this therapy is disease control and not curative. -I reviewed her restaging CT scan. Unfortunately the scan quality was poor, but overall appears no gross progression.  -She is tolerating Sandostatin well, without significant side effects. She will receive her 5th dose today, and continue every 4 weeks.  I told her Sandostatin is not likely going to shrink the tumor significantly, but more likely stabilize the disease and help her with the symptoms.  2. Poorly controlled Diabetes and  hypertension, history of heart failure -I encourage her to monitor her BP and BG at home, and follow-up with her primary care physician.  3. Anorexia -I encourage her to see our dietician, but she declined at this point.   4. Memory loss and confusion -neuro exam intact, likely related to her poorly controlled HTN and DM, she will follow up with PCP.     Plan RTC in 2 month Continue Sandostatin injection every 4 weeks Restaging CT in 4 month  All questions were answered. The patient knows to  call the clinic with any problems, questions or concerns. I spent 20 minutes counseling the patient face to face. The total time spent in the appointment was 30 minutes and more than 50% was on counseling.    Truitt Merle  01/03/2014

## 2014-01-03 NOTE — Telephone Encounter (Signed)
Pt confirmed labs/ov per 12/28 POF, gave pt AVS.... KJ °

## 2014-01-31 ENCOUNTER — Ambulatory Visit: Payer: Medicare (Managed Care)

## 2014-02-01 ENCOUNTER — Ambulatory Visit (HOSPITAL_BASED_OUTPATIENT_CLINIC_OR_DEPARTMENT_OTHER): Payer: Medicare Other

## 2014-02-01 DIAGNOSIS — D3A098 Benign carcinoid tumors of other sites: Secondary | ICD-10-CM

## 2014-02-01 DIAGNOSIS — C7A00 Malignant carcinoid tumor of unspecified site: Secondary | ICD-10-CM

## 2014-02-01 DIAGNOSIS — C7B09 Secondary carcinoid tumors of other sites: Secondary | ICD-10-CM

## 2014-02-01 DIAGNOSIS — C7B02 Secondary carcinoid tumors of liver: Secondary | ICD-10-CM

## 2014-02-01 MED ORDER — OCTREOTIDE ACETATE 30 MG IM KIT
30.0000 mg | PACK | Freq: Once | INTRAMUSCULAR | Status: AC
  Administered 2014-02-01: 30 mg via INTRAMUSCULAR
  Filled 2014-02-01: qty 1

## 2014-02-09 ENCOUNTER — Telehealth: Payer: Self-pay | Admitting: Hematology

## 2014-02-09 NOTE — Telephone Encounter (Signed)
Confirm appointment r/s to 02/25.

## 2014-02-21 ENCOUNTER — Other Ambulatory Visit: Payer: Medicare (Managed Care)

## 2014-02-28 ENCOUNTER — Other Ambulatory Visit: Payer: Medicare (Managed Care)

## 2014-02-28 ENCOUNTER — Ambulatory Visit: Payer: Medicare (Managed Care)

## 2014-02-28 ENCOUNTER — Ambulatory Visit: Payer: Medicare (Managed Care) | Admitting: Hematology

## 2014-03-03 ENCOUNTER — Ambulatory Visit (HOSPITAL_BASED_OUTPATIENT_CLINIC_OR_DEPARTMENT_OTHER): Payer: Medicare Other

## 2014-03-03 ENCOUNTER — Other Ambulatory Visit (HOSPITAL_BASED_OUTPATIENT_CLINIC_OR_DEPARTMENT_OTHER): Payer: Medicare Other

## 2014-03-03 ENCOUNTER — Ambulatory Visit (HOSPITAL_BASED_OUTPATIENT_CLINIC_OR_DEPARTMENT_OTHER): Payer: Medicare Other | Admitting: Hematology

## 2014-03-03 ENCOUNTER — Telehealth: Payer: Self-pay | Admitting: Hematology

## 2014-03-03 ENCOUNTER — Encounter: Payer: Self-pay | Admitting: Hematology

## 2014-03-03 VITALS — BP 162/80 | HR 62 | Temp 97.9°F | Resp 18 | Ht 70.0 in | Wt 331.1 lb

## 2014-03-03 DIAGNOSIS — D3A098 Benign carcinoid tumors of other sites: Secondary | ICD-10-CM

## 2014-03-03 DIAGNOSIS — C7B09 Secondary carcinoid tumors of other sites: Secondary | ICD-10-CM

## 2014-03-03 DIAGNOSIS — I10 Essential (primary) hypertension: Secondary | ICD-10-CM

## 2014-03-03 DIAGNOSIS — C7B02 Secondary carcinoid tumors of liver: Secondary | ICD-10-CM

## 2014-03-03 DIAGNOSIS — E1165 Type 2 diabetes mellitus with hyperglycemia: Secondary | ICD-10-CM

## 2014-03-03 DIAGNOSIS — C7A00 Malignant carcinoid tumor of unspecified site: Secondary | ICD-10-CM

## 2014-03-03 DIAGNOSIS — R630 Anorexia: Secondary | ICD-10-CM

## 2014-03-03 DIAGNOSIS — E340 Carcinoid syndrome: Secondary | ICD-10-CM

## 2014-03-03 LAB — CBC WITH DIFFERENTIAL/PLATELET
BASO%: 0.3 % (ref 0.0–2.0)
Basophils Absolute: 0 10*3/uL (ref 0.0–0.1)
EOS ABS: 0.1 10*3/uL (ref 0.0–0.5)
EOS%: 0.8 % (ref 0.0–7.0)
HCT: 40.3 % (ref 34.8–46.6)
HGB: 13 g/dL (ref 11.6–15.9)
LYMPH%: 18.3 % (ref 14.0–49.7)
MCH: 29.7 pg (ref 25.1–34.0)
MCHC: 32.3 g/dL (ref 31.5–36.0)
MCV: 92.2 fL (ref 79.5–101.0)
MONO#: 0.8 10*3/uL (ref 0.1–0.9)
MONO%: 8.2 % (ref 0.0–14.0)
NEUT%: 72.4 % (ref 38.4–76.8)
NEUTROS ABS: 7.2 10*3/uL — AB (ref 1.5–6.5)
Platelets: 253 10*3/uL (ref 145–400)
RBC: 4.37 10*6/uL (ref 3.70–5.45)
RDW: 14 % (ref 11.2–14.5)
WBC: 9.9 10*3/uL (ref 3.9–10.3)
lymph#: 1.8 10*3/uL (ref 0.9–3.3)

## 2014-03-03 LAB — COMPREHENSIVE METABOLIC PANEL (CC13)
ALBUMIN: 3.3 g/dL — AB (ref 3.5–5.0)
ALK PHOS: 406 U/L — AB (ref 40–150)
ALT: 25 U/L (ref 0–55)
AST: 30 U/L (ref 5–34)
Anion Gap: 11 mEq/L (ref 3–11)
BILIRUBIN TOTAL: 0.83 mg/dL (ref 0.20–1.20)
BUN: 16.1 mg/dL (ref 7.0–26.0)
CO2: 25 mEq/L (ref 22–29)
Calcium: 9.2 mg/dL (ref 8.4–10.4)
Chloride: 105 mEq/L (ref 98–109)
Creatinine: 1.1 mg/dL (ref 0.6–1.1)
EGFR: 60 mL/min/{1.73_m2} — ABNORMAL LOW (ref >90)
GLUCOSE: 170 mg/dL — AB (ref 70–140)
Potassium: 3.7 mEq/L (ref 3.5–5.1)
SODIUM: 142 meq/L (ref 136–145)
TOTAL PROTEIN: 7.7 g/dL (ref 6.4–8.3)

## 2014-03-03 MED ORDER — OXYCODONE-ACETAMINOPHEN 10-325 MG PO TABS: 1.0000 | ORAL_TABLET | Freq: Three times a day (TID) | ORAL | Status: DC | PRN

## 2014-03-03 MED ORDER — OCTREOTIDE ACETATE 30 MG IM KIT
30.0000 mg | PACK | Freq: Once | INTRAMUSCULAR | Status: AC
  Administered 2014-03-03: 30 mg via INTRAMUSCULAR
  Filled 2014-03-03: qty 1

## 2014-03-03 NOTE — Telephone Encounter (Signed)
Gave avs & calendar for March thru May.

## 2014-03-03 NOTE — Progress Notes (Signed)
[place] FOLLOW UP NOTE Date of Visit: 11/05/2013  Patient Care Team: Secundino Ginger, PA-C as PCP - General (Cardiology)  ONCOLOGY DIAGNOSIS  Low grade carcinoid tumor, unknown primary site, with mets to liver, left adrenal and mesenteric mets, stage IV, diagnosed on 08/24/13 through liver biopsy.   CURRENT THERAPY:  Octreotide (Sandostatin) 30 mg IM once a month, started on 09/10/13   CHIEF COMPLAIN Follow up  HISTORY OF PRESENTING ILLNESS (from Dr. Renella Cunas note):   Patient had been seen at the ED on 03/29/2013 with increasing abdominal pain and shortness of breath accompanied by weight loss and fevers. She also had early satiety, intermittent constipation, decreased appetite. Workup included a CT of the abdomen and pelvis without contrast on the same day, revealing a partially calcified mesenteric mass with innumerable metastatic lesions in the liver suspicious for metastatic carcinoid tumor with extends of liver involvement.   She underwent a CT-guided core biopsy of the left adrenal gland which was essentially non -diagnostic ([id number]). The overall material was very limited and composed of rare atypical cells in a background of blood and fibrin.   She was discharged on 3/26 after symptoms were controlled with HHPT and RN, and was instructed to follow up as outpatient with PCP and GI specialty. She failed to do so. She was admitted on 08/23/2013 with progressive, similar symptoms described above, worsening over the last week, with dull,central abdominal pain, a 30 lb unintentional weight loss, nausea, anorexia, diarrhea, fatigue, weakness, low grade fevers and chills. She is able tolerate some liquids, but no real solid food. She was short of breath, but no chest pain. She denied any GI or GU bleed. She had no confusion .No other symptoms are reported. A new CT scan on 8/18 showed again a mesenteric mass 4.6 x 3 cm (previously 4.7 x 3 cm) with multiple  lesions in the liver, largest 4.9cm x 3.7 cm (stable), left adrenal mass 5.4 x 4.9 cm (previously 4.9 x 4.6 cm) Atelectasis in lung bases bilaterally, cardiomegaly, pelvic floor laxity.aortocaval lymph node 1.5 cm (1.4 cm before). She also had acute renal failure, leukocytosis with WBC of 21.9, and mild anemia with a Hb of 11.7. Her bili was 1.6.    Biopsy of the liver lesion on 8/18 was positive for metastatic well differentiated neuroendocrine tumor (carcinoid)    CXR 08/23/2013 shows Cardiomegaly with mild interstitial prominence. Mild component of congestive heart failure may be present. Interstitial pneumonitis cannot be excluded. These findings are less prominent than on prior chest x-ray 03/29/2013.  Marland Kitchen  The biopsy reveals nests of cells with moderate cytoplasm and round nuclei with neuroendocrine chromatin.There is necrosis. Immunohistochemistry reveals the cells are positive for CD56,chromogranin,synaptophysin, and CDX-2. They are negative for TTF-1. These findings are consistent with a well differentiated neuroendocrine tumor (carcinoid) of gastrointestinal origin.([id number])   Patient has been evaluated by surgery Dr Zella Richer, concluding that patient is not a candidate for resection. Based on these findings we were requested to see the patient in consultation regarding therapeutic options. This appears to be low grade Carcinoid disease of mesentery with liver and adrenal involvement and retroperitoneal adenopathy as well. We have measurable disease which we can follow on subsequent imaging.A 24-HOUR QUANTITATIVE 5 HIAA IN URINE was 29.9 (h) normal <=6.0 mg/24 h volume of urine 600 ml/24 hrs. Serum LDH was 229 normal. Copper 164 mcg normal, zinc low at 51 mcg, Ferritin 442 high marker of inflammation, serum iron low at 21 microgram, TIBC  190, FOLATE 8.4, B12 507 pg normal, CHROMOGRANIN A 33 elevated (normal <15 ng), ESR 123 high, ca19-9 23 normal, CEA 1.6 normal, CA125 25 normal.   Patient  started systemic therapy with Octreotide (Sandostatin) 30 mg IM once a month and receive her first shot on 09/10/2013. She was also given Iron replacement with FERAHEME on 09/20/13 1,020 mg.   INTERVAL HISTORY: Ms Schwanz returns for follow-up. She states she feels with the same. She came in in a wheelchair with her daughter. She is doing well overall, feel of a same as before. No new complaints. She reports intermittent up abdominal pain, a few times a week, and she takes Percocet if the pain is severe. Her energy level remains same, moderate, she is not active, comes in with a wheelchair as usual, she does walk around at home. She is able to take advice of muscle time, but not much else.   MEDICAL HISTORY:  Past Medical History  Diagnosis Date  . Pneumonia   . Renal disorder     renal failure  . Obesity   . Hypertension   . Diabetes mellitus   . High cholesterol   . CHF (congestive heart failure)     SURGICAL HISTORY: Past Surgical History  Procedure Laterality Date  . Abdominal hysterectomy    . Knee reconstruction, medial patellar femoral ligament      SOCIAL HISTORY: History   Social History  . Marital Status: Single    Spouse Name: N/A  . Number of Children: N/A  . Years of Education: N/A   Occupational History  . Not on file.   Social History Main Topics  . Smoking status: Never Smoker   . Smokeless tobacco: Never Used  . Alcohol Use: No  . Drug Use: No  . Sexual Activity: No   Other Topics Concern  . Not on file   Social History Narrative   Retired. Originally from [place], [place]. Retired. 1 daughter whom she lives now here in [facility]. Never smoked cigarettes. Denies history of ETOH or recreational drugs.   FAMILY HISTORY:  Patient is adopted, family history is not known, except that she has 1 sister with breast cancer. 1 daughter, 1 grandson 19 years old.   ALLERGIES:  is allergic to penicillins. It causes itching and swelling.  MEDICATIONS:  Current  Outpatient Prescriptions  Medication Sig Dispense Refill  . acetaminophen (TYLENOL) 650 MG CR tablet Take 650 mg by mouth every 8 (eight) hours as needed for pain.    Marland Kitchen albuterol (PROVENTIL HFA;VENTOLIN HFA) 108 (90 BASE) MCG/ACT inhaler Inhale 2 puffs into the lungs every 6 (six) hours as needed for wheezing.     Marland Kitchen amLODipine-benazepril (LOTREL) 5-10 MG per capsule Take 1 capsule by mouth daily.    Marland Kitchen atenolol (TENORMIN) 25 MG tablet Take by mouth daily.    . beclomethasone (QVAR) 80 MCG/ACT inhaler Inhale 1 puff into the lungs as needed (sob). breathing    . Cranberry (HM CRANBERRY SUPER STRENGTH) 300 MG tablet Take 300 mg by mouth 2 (two) times daily.    . feeding supplement, ENSURE COMPLETE, (ENSURE COMPLETE) LIQD Take 237 mLs by mouth 2 (two) times daily between meals. 30 Bottle 0  . ferrous gluconate (FERGON) 324 MG tablet Take 324 mg by mouth 2 (two) times daily with a meal.    . furosemide (LASIX) 40 MG tablet Take 1 tablet (40 mg total) by mouth daily. 10 tablet 0  . gabapentin (NEURONTIN) 400 MG capsule Take 400  mg by mouth 3 (three) times daily.    . Ginger, Zingiber officinalis, (GINGER ROOT) 550 MG CAPS Take 1 capsule by mouth daily.    Marland Kitchen HYDROcodone-acetaminophen (NORCO/VICODIN) 5-325 MG per tablet Take 2 tablets by mouth every 6 (six) hours as needed for moderate pain. (Patient not taking: Reported on 12/06/2013) 30 tablet 0  . insulin aspart (NOVOLOG) 100 UNIT/ML injection Inject 40-50 Units into the skin 3 (three) times daily before meals. Inject 50 units at breakfast and 40 units at  lunch and 40 units at dinner. Pt on sliding scale    . insulin glargine (LANTUS) 100 UNIT/ML injection Inject 50 Units into the skin 2 (two) times daily. In the morning and at bedtime.    Marland Kitchen LORazepam (ATIVAN) 0.5 MG tablet Take 0.5 mg by mouth at bedtime as needed for anxiety.    . ondansetron (ZOFRAN ODT) 4 MG disintegrating tablet Take 1 tablet (4 mg total) by mouth every 8 (eight) hours as needed for  nausea or vomiting. 20 tablet 0  . oxyCODONE-acetaminophen (PERCOCET) 10-325 MG per tablet Take 1 tablet by mouth every 4 (four) hours as needed for pain. 30 tablet 0  . spironolactone (ALDACTONE) 25 MG tablet Take 25 mg by mouth daily.    . vitamin C (ASCORBIC ACID) 500 MG tablet Take 1 tablet (500 mg total) by mouth daily. 30 tablet 5  . Zinc 50 MG TABS Take 1 tablet (50 mg total) by mouth daily. 30 tablet 5   No current facility-administered medications for this visit.    ROS  PHYSICAL EXAMINATION: ECOG PERFORMANCE STATUS: 1, KPS 80  Filed Vitals:   03/03/14 1451  BP: 162/80  Pulse: 62  Temp: 97.9 F (36.6 C)  Resp: 18   Filed Weights   03/03/14 1451  Weight: 331 lb 1.6 oz (150.186 kg)    Physical Exam  Constitutional: She is oriented to person, place, and time. She appears well-developed and well-nourished.  HENT:  Head: Normocephalic and atraumatic.  Mouth/Throat: Oropharynx is clear and moist.  Eyes: Conjunctivae and EOM are normal. Pupils are equal, round, and reactive to light.  Neck: Normal range of motion. Neck supple.  Cardiovascular: Normal rate, regular rhythm and normal heart sounds.   Pulmonary/Chest: Effort normal and breath sounds normal. She has no wheezes. She has no rales.  Abdominal: Soft. Bowel sounds are normal. She exhibits no mass. There is no tenderness.  Musculoskeletal: Normal range of motion. She exhibits no edema.  Neurological: She is alert and oriented to person, place, and time. She has normal reflexes.  Skin: Skin is warm and dry.  Psychiatric: She has a normal mood and affect. Her behavior is normal. Judgment and thought content normal.    LABORATORY DATA:  CBC Latest Ref Rng 03/03/2014 12/27/2013 12/06/2013  WBC 3.9 - 10.3 10e3/uL 9.9 9.0 7.7  Hemoglobin 11.6 - 15.9 g/dL 13.0 12.4 13.3  Hematocrit 34.8 - 46.6 % 40.3 39.7 41.6  Platelets 145 - 400 10e3/uL 253 383 185    CMP Latest Ref Rng 03/03/2014 12/27/2013 12/06/2013  Glucose 70  - 140 mg/dl 170(H) 208(H) 252(H)  BUN 7.0 - 26.0 mg/dL 16.1 15.2 13.5  Creatinine 0.6 - 1.1 mg/dL 1.1 1.0 1.0  Sodium 136 - 145 mEq/L 142 139 142  Potassium 3.5 - 5.1 mEq/L 3.7 3.9 4.1  Chloride 96 - 112 mEq/L - - -  CO2 22 - 29 mEq/L _0 Calcium 8.4 - 10.4 mg/dL 9.2 9.2 9.6  Total Protein  6.4 - 8.3 g/dL 7.7 7.8 7.5  Total Bilirubin 0.20 - 1.20 mg/dL 0.83 0.76 0.95  Alkaline Phos 40 - 150 U/L 406(H) 380(H) 272(H)  AST 5 - 34 U/L _0 ALT 0 - 55 U/L _1 Chromogranin A (Order [id number])      Chromogranin A  Status: Finalresult Visible to patient:  Not Released Nextappt: 01/31/2014 at 08:30 AM in Oncology ([facility]) Dx:  Carcinoid tumor of abdomen              Ref Range 7d ago  4wk ago  10moago     Chromogranin A <=15 ng/mL 36 (H) 39 (H)CM 33 (H)CM         RADIOGRAPHIC STUDIES: CT chest/abd/pelvis 12/21/20154IMPRESSION: 1. No acute findings within the chest abdomen or pelvis. 2. No evidence for thoracic metastasis. 3. Multi focal liver metastasis are difficult to identify on today's exam. This may reflect diminished exam detail due to patient's body habitus, differences in technique, and/or differences in phase of contrast opacification. When compared with the previous exam the dominant lesion in the right hepatic lobe measures smaller than on the prior study. 4. Partially calcified lesion within the mesenteric is slightly decreased in the interval. 5. No evidence for thoracic metastasis.   ASSESSMENT:  64year old female, with history of morbid obese, diabetes, hypertension, congestive heart failure, who was diagnosed with metastases to liver, mesentery and left adrenal gland.  1. 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  -The overall goal of this therapy is disease control and not curative. -Her  restaging scan in 12/2013 appears no gross progression, limited quality.  -She is tolerating Sandostatin well, without significant side effects. will continue every 4 weeks.  I told her Sandostatin is not likely going to shrink the tumor significantly, but more likely stabilize the disease and help her with the symptoms. -Repeat a restaging scan in 3 months.   2. Poorly controlled Diabetes and  hypertension, history of heart failure -I encourage her to monitor her BP and BG at home, and follow-up with her primary care physician.  3. Anorexia --Continue nutrition supplement.    Plan RTC in 3 month Continue Sandostatin injection every 4 weeks Restaging CT in 3 month  All questions were answered. The patient knows to call the clinic with any problems, questions or concerns. I spent 15 minutes counseling the patient face to face. The total time spent in the appointment was 20 minutes and more than 50% was on counseling.    FTruitt Merle 03/03/2014

## 2014-03-08 LAB — CHROMOGRANIN A: Chromogranin A: 52 ng/mL — ABNORMAL HIGH (ref <=15)

## 2014-03-13 LAB — 5 HIAA, QUANTITATIVE, URINE, 24 HOUR
5-HIAA, 24 Hr Urine: 31.9 mg/24 h — ABNORMAL HIGH (ref <=6.0)
Volume, Urine-5HIAA: 1800 mL/24 h

## 2014-03-31 ENCOUNTER — Ambulatory Visit: Payer: Medicare Other

## 2014-03-31 ENCOUNTER — Ambulatory Visit (HOSPITAL_BASED_OUTPATIENT_CLINIC_OR_DEPARTMENT_OTHER): Payer: Medicare Other

## 2014-03-31 DIAGNOSIS — C7B09 Secondary carcinoid tumors of other sites: Secondary | ICD-10-CM | POA: Diagnosis not present

## 2014-03-31 DIAGNOSIS — C7B02 Secondary carcinoid tumors of liver: Secondary | ICD-10-CM | POA: Diagnosis not present

## 2014-03-31 DIAGNOSIS — D3A098 Benign carcinoid tumors of other sites: Secondary | ICD-10-CM

## 2014-03-31 DIAGNOSIS — C7A00 Malignant carcinoid tumor of unspecified site: Secondary | ICD-10-CM | POA: Diagnosis not present

## 2014-03-31 MED ORDER — OCTREOTIDE ACETATE 30 MG IM KIT
30.0000 mg | PACK | Freq: Once | INTRAMUSCULAR | Status: AC
  Administered 2014-03-31: 30 mg via INTRAMUSCULAR
  Filled 2014-03-31: qty 1

## 2014-03-31 NOTE — Patient Instructions (Signed)

## 2014-04-14 ENCOUNTER — Ambulatory Visit: Payer: Medicare Other

## 2014-04-28 ENCOUNTER — Ambulatory Visit (HOSPITAL_BASED_OUTPATIENT_CLINIC_OR_DEPARTMENT_OTHER): Payer: Medicare Other

## 2014-04-28 VITALS — BP 127/73 | HR 59 | Temp 98.4°F

## 2014-04-28 DIAGNOSIS — D3A098 Benign carcinoid tumors of other sites: Secondary | ICD-10-CM

## 2014-04-28 DIAGNOSIS — C7B09 Secondary carcinoid tumors of other sites: Secondary | ICD-10-CM | POA: Diagnosis not present

## 2014-04-28 DIAGNOSIS — C7A00 Malignant carcinoid tumor of unspecified site: Secondary | ICD-10-CM | POA: Diagnosis not present

## 2014-04-28 DIAGNOSIS — C7B02 Secondary carcinoid tumors of liver: Secondary | ICD-10-CM | POA: Diagnosis not present

## 2014-04-28 MED ORDER — OCTREOTIDE ACETATE 30 MG IM KIT
30.0000 mg | PACK | Freq: Once | INTRAMUSCULAR | Status: AC
  Administered 2014-04-28: 30 mg via INTRAMUSCULAR
  Filled 2014-04-28: qty 1

## 2014-05-08 HISTORY — PX: OTHER SURGICAL HISTORY: SHX169

## 2014-05-26 ENCOUNTER — Telehealth: Payer: Self-pay | Admitting: Hematology

## 2014-05-26 ENCOUNTER — Encounter: Payer: Self-pay | Admitting: Hematology

## 2014-05-26 ENCOUNTER — Ambulatory Visit (HOSPITAL_BASED_OUTPATIENT_CLINIC_OR_DEPARTMENT_OTHER): Payer: Medicare Other | Admitting: Hematology

## 2014-05-26 ENCOUNTER — Other Ambulatory Visit (HOSPITAL_BASED_OUTPATIENT_CLINIC_OR_DEPARTMENT_OTHER): Payer: Medicare Other

## 2014-05-26 ENCOUNTER — Ambulatory Visit (HOSPITAL_BASED_OUTPATIENT_CLINIC_OR_DEPARTMENT_OTHER): Payer: Medicare Other

## 2014-05-26 VITALS — BP 159/76 | HR 60 | Temp 98.9°F | Resp 18 | Ht 70.0 in | Wt 327.4 lb

## 2014-05-26 DIAGNOSIS — C7B09 Secondary carcinoid tumors of other sites: Secondary | ICD-10-CM

## 2014-05-26 DIAGNOSIS — C7A00 Malignant carcinoid tumor of unspecified site: Secondary | ICD-10-CM

## 2014-05-26 DIAGNOSIS — I509 Heart failure, unspecified: Secondary | ICD-10-CM

## 2014-05-26 DIAGNOSIS — C7B02 Secondary carcinoid tumors of liver: Secondary | ICD-10-CM

## 2014-05-26 DIAGNOSIS — R630 Anorexia: Secondary | ICD-10-CM

## 2014-05-26 DIAGNOSIS — I10 Essential (primary) hypertension: Secondary | ICD-10-CM

## 2014-05-26 DIAGNOSIS — D3A098 Benign carcinoid tumors of other sites: Secondary | ICD-10-CM

## 2014-05-26 DIAGNOSIS — E1165 Type 2 diabetes mellitus with hyperglycemia: Secondary | ICD-10-CM

## 2014-05-26 LAB — CBC WITH DIFFERENTIAL/PLATELET
BASO%: 1 % (ref 0.0–2.0)
BASOS ABS: 0.1 10*3/uL (ref 0.0–0.1)
EOS%: 0.5 % (ref 0.0–7.0)
Eosinophils Absolute: 0.1 10*3/uL (ref 0.0–0.5)
HCT: 39.9 % (ref 34.8–46.6)
HGB: 12.9 g/dL (ref 11.6–15.9)
LYMPH#: 1.9 10*3/uL (ref 0.9–3.3)
LYMPH%: 16.1 % (ref 14.0–49.7)
MCH: 28.6 pg (ref 25.1–34.0)
MCHC: 32.3 g/dL (ref 31.5–36.0)
MCV: 88.5 fL (ref 79.5–101.0)
MONO#: 1.4 10*3/uL — ABNORMAL HIGH (ref 0.1–0.9)
MONO%: 12.1 % (ref 0.0–14.0)
NEUT%: 70.3 % (ref 38.4–76.8)
NEUTROS ABS: 8.2 10*3/uL — AB (ref 1.5–6.5)
Platelets: 269 10*3/uL (ref 145–400)
RBC: 4.5 10*6/uL (ref 3.70–5.45)
RDW: 13.8 % (ref 11.2–14.5)
WBC: 11.6 10*3/uL — ABNORMAL HIGH (ref 3.9–10.3)

## 2014-05-26 LAB — COMPREHENSIVE METABOLIC PANEL (CC13)
ALT: 37 U/L (ref 0–55)
ANION GAP: 12 meq/L — AB (ref 3–11)
AST: 38 U/L — AB (ref 5–34)
Albumin: 2.9 g/dL — ABNORMAL LOW (ref 3.5–5.0)
Alkaline Phosphatase: 564 U/L — ABNORMAL HIGH (ref 40–150)
BUN: 10.2 mg/dL (ref 7.0–26.0)
CALCIUM: 8.8 mg/dL (ref 8.4–10.4)
CO2: 25 meq/L (ref 22–29)
Chloride: 102 mEq/L (ref 98–109)
Creatinine: 1 mg/dL (ref 0.6–1.1)
EGFR: 68 mL/min/{1.73_m2} — ABNORMAL LOW (ref >90)
Glucose: 252 mg/dl — ABNORMAL HIGH (ref 70–140)
POTASSIUM: 3.9 meq/L (ref 3.5–5.1)
SODIUM: 139 meq/L (ref 136–145)
TOTAL PROTEIN: 7.8 g/dL (ref 6.4–8.3)
Total Bilirubin: 1.94 mg/dL — ABNORMAL HIGH (ref 0.20–1.20)

## 2014-05-26 MED ORDER — OCTREOTIDE ACETATE 30 MG IM KIT
30.0000 mg | PACK | Freq: Once | INTRAMUSCULAR | Status: AC
  Administered 2014-05-26: 30 mg via INTRAMUSCULAR
  Filled 2014-05-26: qty 1

## 2014-05-26 NOTE — Telephone Encounter (Signed)
Gave relative avs report and appointments for June thru August.

## 2014-05-26 NOTE — Progress Notes (Signed)
[place] FOLLOW UP NOTE Date of Visit: 11/05/2013  Patient Care Team: Secundino Ginger, PA-C as PCP - General (Cardiology)  ONCOLOGY DIAGNOSIS  Low grade carcinoid tumor, unknown primary site, with mets to liver, left adrenal and mesenteric mets, stage IV, diagnosed on 08/24/13 through liver biopsy.   CURRENT THERAPY:  Octreotide (Sandostatin) 30 mg IM once a month, started on 09/10/13   CHIEF COMPLAIN Follow up  HISTORY OF PRESENTING ILLNESS (from Dr. Renella Cunas note):   Patient had been seen at the ED on 03/29/2013 with increasing abdominal pain and shortness of breath accompanied by weight loss and fevers. She also had early satiety, intermittent constipation, decreased appetite. Workup included a CT of the abdomen and pelvis without contrast on the same day, revealing a partially calcified mesenteric mass with innumerable metastatic lesions in the liver suspicious for metastatic carcinoid tumor with extends of liver involvement.   She underwent a CT-guided core biopsy of the left adrenal gland which was essentially non -diagnostic ([id number]). The overall material was very limited and composed of rare atypical cells in a background of blood and fibrin.   She was discharged on 3/26 after symptoms were controlled with HHPT and RN, and was instructed to follow up as outpatient with PCP and GI specialty. She failed to do so. She was admitted on 08/23/2013 with progressive, similar symptoms described above, worsening over the last week, with dull,central abdominal pain, a 30 lb unintentional weight loss, nausea, anorexia, diarrhea, fatigue, weakness, low grade fevers and chills. She is able tolerate some liquids, but no real solid food. She was short of breath, but no chest pain. She denied any GI or GU bleed. She had no confusion .No other symptoms are reported. A new CT scan on 8/18 showed again a mesenteric mass 4.6 x 3 cm (previously 4.7 x 3 cm) with multiple  lesions in the liver, largest 4.9cm x 3.7 cm (stable), left adrenal mass 5.4 x 4.9 cm (previously 4.9 x 4.6 cm) Atelectasis in lung bases bilaterally, cardiomegaly, pelvic floor laxity.aortocaval lymph node 1.5 cm (1.4 cm before). She also had acute renal failure, leukocytosis with WBC of 21.9, and mild anemia with a Hb of 11.7. Her bili was 1.6.    Biopsy of the liver lesion on 8/18 was positive for metastatic well differentiated neuroendocrine tumor (carcinoid)    CXR 08/23/2013 shows Cardiomegaly with mild interstitial prominence. Mild component of congestive heart failure may be present. Interstitial pneumonitis cannot be excluded. These findings are less prominent than on prior chest x-ray 03/29/2013.  Marland Kitchen  The biopsy reveals nests of cells with moderate cytoplasm and round nuclei with neuroendocrine chromatin.There is necrosis. Immunohistochemistry reveals the cells are positive for CD56,chromogranin,synaptophysin, and CDX-2. They are negative for TTF-1. These findings are consistent with a well differentiated neuroendocrine tumor (carcinoid) of gastrointestinal origin.([id number])   Patient has been evaluated by surgery Dr Zella Richer, concluding that patient is not a candidate for resection. Based on these findings we were requested to see the patient in consultation regarding therapeutic options. This appears to be low grade Carcinoid disease of mesentery with liver and adrenal involvement and retroperitoneal adenopathy as well. We have measurable disease which we can follow on subsequent imaging.A 24-HOUR QUANTITATIVE 5 HIAA IN URINE was 29.9 (h) normal <=6.0 mg/24 h volume of urine 600 ml/24 hrs. Serum LDH was 229 normal. Copper 164 mcg normal, zinc low at 51 mcg, Ferritin 442 high marker of inflammation, serum iron low at 21 microgram, TIBC  190, FOLATE 8.4, B12 507 pg normal, CHROMOGRANIN A 33 elevated (normal <15 ng), ESR 123 high, ca19-9 23 normal, CEA 1.6 normal, CA125 25 normal.   Patient  started systemic therapy with Octreotide (Sandostatin) 30 mg IM once a month and receive her first shot on 09/10/2013. She was also given Iron replacement with FERAHEME on 09/20/13 1,020 mg.   INTERVAL HISTORY: Yesenia Lyons returns for follow-up. She came in in a wheelchair with her daughter. She is doing moderately well. She has intermittent abdominal pain, not related to food, usually resolves on its own. she has occasional diarrhea and nausea, no vomiting. No other changes. She has stable dyspnea on exertion. She is not very active, uses a walk at home, does not go out much. Appetite is moderate and stable, no other new changes.   MEDICAL HISTORY:  Past Medical History  Diagnosis Date  . Pneumonia   . Renal disorder     renal failure  . Obesity   . Hypertension   . Diabetes mellitus   . High cholesterol   . CHF (congestive heart failure)     SURGICAL HISTORY: Past Surgical History  Procedure Laterality Date  . Abdominal hysterectomy    . Knee reconstruction, medial patellar femoral ligament      SOCIAL HISTORY: History   Social History  . Marital Status: Single    Spouse Name: N/A  . Number of Children: N/A  . Years of Education: N/A   Occupational History  . Not on file.   Social History Main Topics  . Smoking status: Never Smoker   . Smokeless tobacco: Never Used  . Alcohol Use: No  . Drug Use: No  . Sexual Activity: No   Other Topics Concern  . Not on file   Social History Narrative   Retired. Originally from [place], [place]. Retired. 1 daughter whom she lives now here in [facility]. Never smoked cigarettes. Denies history of ETOH or recreational drugs.   FAMILY HISTORY:  Patient is adopted, family history is not known, except that she has 1 sister with breast cancer. 1 daughter, 1 grandson 45 years old.   ALLERGIES:  is allergic to penicillins. It causes itching and swelling.  MEDICATIONS:  Current Outpatient Prescriptions  Medication Sig Dispense Refill  .  acetaminophen (TYLENOL) 650 MG CR tablet Take 650 mg by mouth every 8 (eight) hours as needed for pain.    Marland Kitchen albuterol (PROVENTIL HFA;VENTOLIN HFA) 108 (90 BASE) MCG/ACT inhaler Inhale 2 puffs into the lungs every 6 (six) hours as needed for wheezing.     Marland Kitchen amLODipine-benazepril (LOTREL) 5-10 MG per capsule Take 1 capsule by mouth daily.    Marland Kitchen atenolol (TENORMIN) 25 MG tablet Take by mouth daily.    . beclomethasone (QVAR) 80 MCG/ACT inhaler Inhale 1 puff into the lungs as needed (sob). breathing    . Cranberry (HM CRANBERRY SUPER STRENGTH) 300 MG tablet Take 300 mg by mouth 2 (two) times daily.    . feeding supplement, ENSURE COMPLETE, (ENSURE COMPLETE) LIQD Take 237 mLs by mouth 2 (two) times daily between meals. 30 Bottle 0  . ferrous gluconate (FERGON) 324 MG tablet Take 324 mg by mouth 2 (two) times daily with a meal.    . furosemide (LASIX) 40 MG tablet Take 1 tablet (40 mg total) by mouth daily. 10 tablet 0  . gabapentin (NEURONTIN) 400 MG capsule Take 400 mg by mouth 3 (three) times daily.    . Ginger, Zingiber officinalis, (GINGER ROOT) 550  MG CAPS Take 1 capsule by mouth daily.    . insulin aspart (NOVOLOG) 100 UNIT/ML injection Inject 40-50 Units into the skin 3 (three) times daily before meals. Inject 50 units at breakfast and 40 units at  lunch and 40 units at dinner. Pt on sliding scale    . insulin glargine (LANTUS) 100 UNIT/ML injection Inject 50 Units into the skin 2 (two) times daily. In the morning and at bedtime.    Marland Kitchen LORazepam (ATIVAN) 0.5 MG tablet Take 0.5 mg by mouth at bedtime as needed for anxiety.    . ondansetron (ZOFRAN ODT) 4 MG disintegrating tablet Take 1 tablet (4 mg total) by mouth every 8 (eight) hours as needed for nausea or vomiting. 20 tablet 0  . oxyCODONE-acetaminophen (PERCOCET) 10-325 MG per tablet Take 1 tablet by mouth every 8 (eight) hours as needed for pain. 30 tablet 0  . spironolactone (ALDACTONE) 25 MG tablet Take 25 mg by mouth daily.    . vitamin C  (ASCORBIC ACID) 500 MG tablet Take 1 tablet (500 mg total) by mouth daily. 30 tablet 5  . Zinc 50 MG TABS Take 1 tablet (50 mg total) by mouth daily. 30 tablet 5   No current facility-administered medications for this visit.    ROS  PHYSICAL EXAMINATION: ECOG PERFORMANCE STATUS: 2-3  Filed Vitals:   05/26/14 1606  BP: 159/76  Pulse: 60  Temp: 98.9 F (37.2 C)  Resp: 18   Filed Weights   05/26/14 1606  Weight: 327 lb 6.4 oz (148.508 kg)    Physical Exam  Constitutional: She is oriented to person, place, and time. She appears well-developed and well-nourished.  HENT:  Head: Normocephalic and atraumatic.  Mouth/Throat: Oropharynx is clear and moist.  Eyes: Conjunctivae and EOM are normal. Pupils are equal, round, and reactive to light.  Neck: Normal range of motion. Neck supple.  Cardiovascular: Normal rate, regular rhythm and normal heart sounds.   Pulmonary/Chest: Effort normal and breath sounds normal. She has no wheezes. She has no rales.  Abdominal: Soft. Bowel sounds are normal. She exhibits no mass. There is tenderness. There is no rebound and no guarding.  Musculoskeletal: Normal range of motion. She exhibits no edema.  Neurological: She is alert and oriented to person, place, and time. She has normal reflexes.  Skin: Skin is warm and dry.  Psychiatric: She has a normal mood and affect. Her behavior is normal. Judgment and thought content normal.  She has mild abdominal tenderness at RUQ and epigastric area.   LABORATORY DATA:  CBC Latest Ref Rng 05/26/2014 03/03/2014 12/27/2013  WBC 3.9 - 10.3 10e3/uL 11.6(H) 9.9 9.0  Hemoglobin 11.6 - 15.9 g/dL 12.9 13.0 12.4  Hematocrit 34.8 - 46.6 % 39.9 40.3 39.7  Platelets 145 - 400 10e3/uL 269 253 383    CMP Latest Ref Rng 05/26/2014 03/03/2014 12/27/2013  Glucose 70 - 140 mg/dl 252(H) 170(H) 208(H)  BUN 7.0 - 26.0 mg/dL 10.2 16.1 15.2  Creatinine 0.6 - 1.1 mg/dL 1.0 1.1 1.0  Sodium 136 - 145 mEq/L 139 142 139  Potassium  3.5 - 5.1 mEq/L 3.9 3.7 3.9  Chloride 96 - 112 mEq/L - - -  CO2 22 - 29 mEq/L _0 Calcium 8.4 - 10.4 mg/dL 8.8 9.2 9.2  Total Protein 6.4 - 8.3 g/dL 7.8 7.7 7.8  Total Bilirubin 0.20 - 1.20 mg/dL 1.94(H) 0.83 0.76  Alkaline Phos 40 - 150 U/L 564(H) 406(H) 380(H)  AST 5 - 34 U/L 38(H)  30 19  ALT 0 - 55 U/L 37 25 19                 Ref Range 7d ago  4wk ago  34moago     Chromogranin A <=15 ng/mL 36 (H) 39 (H)CM 33 (H)CM      Chromogranin A  Status: Finalresult Visible to patient:  MyChart Nextappt: Today at 04:15 PM [place] [facility]:  Malignant carcinoid tumor of unknown ...              Ref Range 220mogo (03/03/14) 38m47moo (12/27/13) 38mo32mo (12/06/13)    Chromogranin A <=15 ng/mL 52 (H) 36 (H)CM 39 (H)CM           RADIOGRAPHIC STUDIES: CT chest/abd/pelvis 12/21/20154IMPRESSION: 1. No acute findings within the chest abdomen or pelvis. 2. No evidence for thoracic metastasis. 3. Multi focal liver metastasis are difficult to identify on today's exam. This may reflect diminished exam detail due to patient's body habitus, differences in technique, and/or differences in phase of contrast opacification. When compared with the previous exam the dominant lesion in the right hepatic lobe measures smaller than on the prior study. 4. Partially calcified lesion within the mesenteric is slightly decreased in the interval. 5. No evidence for thoracic metastasis.   ASSESSMENT:  63 y10r old female, with history of morbid obese, diabetes, hypertension, congestive heart failure, who was diagnosed with metastases to liver, mesentery and left adrenal gland.  1. 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  -The overall goal of this therapy is disease control and not curative. -Her restaging scan in 12/2013 appears no gross  progression, limited quality.  -She is tolerating Sandostatin well, without significant side effects. will continue every 4 weeks.  I told her Sandostatin is not likely going to shrink the tumor significantly, but more likely stabilize the disease and help her with the symptoms. -She is scheduled to have restaging CT next week -Tumor markers are overall stable, today's results are still pending. -She has mild intermittent abdominal pain, overall stable.  2. Poorly controlled Diabetes and  hypertension, history of heart failure -I encourage her to monitor her BP and BG at home, and follow-up with her primary care physician.  3. Anorexia --Continue nutrition supplement.    Plan RTC in 3 month Continue Sandostatin injection every 4 weeks I'll call her after her restaging CT scan next week. If there is significant disease progression, I'll see her afterwards.  All questions were answered. The patient knows to call the clinic with any problems, questions or concerns. I spent 20 minutes counseling the patient face to face. The total time spent in the appointment was 25  minutes and more than 50% was on counseling.    FengTruitt Merle19/2016

## 2014-06-01 ENCOUNTER — Ambulatory Visit (HOSPITAL_COMMUNITY)
Admission: RE | Admit: 2014-06-01 | Discharge: 2014-06-01 | Disposition: A | Discharge status: Home / Self Care | Payer: Medicare Other | Source: Ambulatory Visit | Attending: Hematology | Admitting: Hematology

## 2014-06-01 ENCOUNTER — Encounter (HOSPITAL_COMMUNITY): Payer: Self-pay

## 2014-06-01 DIAGNOSIS — C7A00 Malignant carcinoid tumor of unspecified site: Secondary | ICD-10-CM

## 2014-06-01 DIAGNOSIS — R109 Unspecified abdominal pain: Secondary | ICD-10-CM | POA: Insufficient documentation

## 2014-06-01 MED ORDER — IOHEXOL 300 MG/ML  SOLN
150.0000 mL | Freq: Once | INTRAMUSCULAR | Status: AC | PRN
  Administered 2014-06-01: 125 mL via INTRAVENOUS

## 2014-06-01 MED ORDER — IOHEXOL 300 MG/ML  SOLN: 100.0000 mL | Freq: Once | INTRAMUSCULAR | Status: DC | PRN

## 2014-06-02 ENCOUNTER — Telehealth: Payer: Self-pay | Admitting: *Deleted

## 2014-06-02 LAB — CHROMOGRANIN A: Chromogranin A: 123 ng/mL — ABNORMAL HIGH (ref <=15)

## 2014-06-02 NOTE — Telephone Encounter (Signed)
Patient daughter would like for MD Burr Medico to call her with the results of CT scan. # [id number]. Message sent to MD Burr Medico and RN Meredeth Ide

## 2014-06-02 NOTE — Telephone Encounter (Signed)
I called pt's daughter, and explained her scan findings, and worsening liver functions. I will present her case in GI tumor board next week, to see if she is a candidate for liver targeted therapy. I will call her back after the conference discussion.   Truitt Merle  06/02/2014

## 2014-06-03 ENCOUNTER — Encounter (HOSPITAL_COMMUNITY): Payer: Self-pay

## 2014-06-03 ENCOUNTER — Inpatient Hospital Stay (HOSPITAL_COMMUNITY)
Admission: EM | Admit: 2014-06-03 | Discharge: 2014-06-11 | DRG: 853 | Disposition: A | Discharge status: Home Health | Payer: Medicare Other | Attending: Internal Medicine | Admitting: Internal Medicine

## 2014-06-03 ENCOUNTER — Emergency Department (HOSPITAL_COMMUNITY): Payer: Medicare Other

## 2014-06-03 DIAGNOSIS — J9601 Acute respiratory failure with hypoxia: Secondary | ICD-10-CM | POA: Diagnosis not present

## 2014-06-03 DIAGNOSIS — I5032 Chronic diastolic (congestive) heart failure: Secondary | ICD-10-CM | POA: Diagnosis present

## 2014-06-03 DIAGNOSIS — C7A098 Malignant carcinoid tumors of other sites: Secondary | ICD-10-CM | POA: Diagnosis present

## 2014-06-03 DIAGNOSIS — E340 Carcinoid syndrome: Secondary | ICD-10-CM | POA: Diagnosis present

## 2014-06-03 DIAGNOSIS — C7972 Secondary malignant neoplasm of left adrenal gland: Secondary | ICD-10-CM | POA: Diagnosis present

## 2014-06-03 DIAGNOSIS — Z6841 Body Mass Index (BMI) 40.0 and over, adult: Secondary | ICD-10-CM

## 2014-06-03 DIAGNOSIS — R630 Anorexia: Secondary | ICD-10-CM | POA: Diagnosis present

## 2014-06-03 DIAGNOSIS — K831 Obstruction of bile duct: Secondary | ICD-10-CM | POA: Insufficient documentation

## 2014-06-03 DIAGNOSIS — K819 Cholecystitis, unspecified: Secondary | ICD-10-CM | POA: Insufficient documentation

## 2014-06-03 DIAGNOSIS — R1011 Right upper quadrant pain: Secondary | ICD-10-CM | POA: Diagnosis present

## 2014-06-03 DIAGNOSIS — E119 Type 2 diabetes mellitus without complications: Secondary | ICD-10-CM

## 2014-06-03 DIAGNOSIS — C787 Secondary malignant neoplasm of liver and intrahepatic bile duct: Secondary | ICD-10-CM | POA: Diagnosis present

## 2014-06-03 DIAGNOSIS — Z880 Allergy status to penicillin: Secondary | ICD-10-CM

## 2014-06-03 DIAGNOSIS — R410 Disorientation, unspecified: Secondary | ICD-10-CM | POA: Diagnosis not present

## 2014-06-03 DIAGNOSIS — R1013 Epigastric pain: Secondary | ICD-10-CM | POA: Diagnosis present

## 2014-06-03 DIAGNOSIS — Z794 Long term (current) use of insulin: Secondary | ICD-10-CM

## 2014-06-03 DIAGNOSIS — K810 Acute cholecystitis: Secondary | ICD-10-CM | POA: Diagnosis present

## 2014-06-03 DIAGNOSIS — A4189 Other specified sepsis: Secondary | ICD-10-CM

## 2014-06-03 DIAGNOSIS — A419 Sepsis, unspecified organism: Principal | ICD-10-CM | POA: Diagnosis present

## 2014-06-03 DIAGNOSIS — N179 Acute kidney failure, unspecified: Secondary | ICD-10-CM | POA: Diagnosis not present

## 2014-06-03 DIAGNOSIS — Z90710 Acquired absence of both cervix and uterus: Secondary | ICD-10-CM

## 2014-06-03 DIAGNOSIS — E11649 Type 2 diabetes mellitus with hypoglycemia without coma: Secondary | ICD-10-CM | POA: Diagnosis present

## 2014-06-03 DIAGNOSIS — C786 Secondary malignant neoplasm of retroperitoneum and peritoneum: Secondary | ICD-10-CM | POA: Diagnosis present

## 2014-06-03 DIAGNOSIS — Z79891 Long term (current) use of opiate analgesic: Secondary | ICD-10-CM

## 2014-06-03 DIAGNOSIS — Z79899 Other long term (current) drug therapy: Secondary | ICD-10-CM

## 2014-06-03 DIAGNOSIS — R509 Fever, unspecified: Secondary | ICD-10-CM

## 2014-06-03 DIAGNOSIS — K5900 Constipation, unspecified: Secondary | ICD-10-CM | POA: Diagnosis present

## 2014-06-03 DIAGNOSIS — D3A098 Benign carcinoid tumors of other sites: Secondary | ICD-10-CM | POA: Diagnosis present

## 2014-06-03 DIAGNOSIS — E669 Obesity, unspecified: Secondary | ICD-10-CM | POA: Diagnosis present

## 2014-06-03 DIAGNOSIS — E1165 Type 2 diabetes mellitus with hyperglycemia: Secondary | ICD-10-CM | POA: Diagnosis present

## 2014-06-03 DIAGNOSIS — R160 Hepatomegaly, not elsewhere classified: Secondary | ICD-10-CM | POA: Diagnosis present

## 2014-06-03 DIAGNOSIS — R531 Weakness: Secondary | ICD-10-CM

## 2014-06-03 DIAGNOSIS — E1169 Type 2 diabetes mellitus with other specified complication: Secondary | ICD-10-CM

## 2014-06-03 DIAGNOSIS — E780 Pure hypercholesterolemia: Secondary | ICD-10-CM | POA: Diagnosis present

## 2014-06-03 DIAGNOSIS — M852 Hyperostosis of skull: Secondary | ICD-10-CM | POA: Diagnosis present

## 2014-06-03 DIAGNOSIS — E871 Hypo-osmolality and hyponatremia: Secondary | ICD-10-CM | POA: Diagnosis present

## 2014-06-03 DIAGNOSIS — D72829 Elevated white blood cell count, unspecified: Secondary | ICD-10-CM

## 2014-06-03 DIAGNOSIS — M8580 Other specified disorders of bone density and structure, unspecified site: Secondary | ICD-10-CM | POA: Diagnosis present

## 2014-06-03 DIAGNOSIS — I10 Essential (primary) hypertension: Secondary | ICD-10-CM | POA: Diagnosis present

## 2014-06-03 DIAGNOSIS — R197 Diarrhea, unspecified: Secondary | ICD-10-CM | POA: Diagnosis present

## 2014-06-03 DIAGNOSIS — G9340 Encephalopathy, unspecified: Secondary | ICD-10-CM | POA: Diagnosis present

## 2014-06-03 DIAGNOSIS — E872 Acidosis: Secondary | ICD-10-CM | POA: Diagnosis present

## 2014-06-03 HISTORY — DX: Cranial cerebrospinal fluid leak, spontaneous: G96.01

## 2014-06-03 HISTORY — DX: Cerebrospinal fluid leak: G96.0

## 2014-06-03 LAB — COMPREHENSIVE METABOLIC PANEL
ALT: 44 U/L (ref 14–54)
AST: 55 U/L — ABNORMAL HIGH (ref 15–41)
Albumin: 3 g/dL — ABNORMAL LOW (ref 3.5–5.0)
Alkaline Phosphatase: 530 U/L — ABNORMAL HIGH (ref 38–126)
Anion gap: 10 (ref 5–15)
BUN: 16 mg/dL (ref 6–20)
CO2: 26 mmol/L (ref 22–32)
Calcium: 8.5 mg/dL — ABNORMAL LOW (ref 8.9–10.3)
Chloride: 95 mmol/L — ABNORMAL LOW (ref 101–111)
Creatinine, Ser: 1.29 mg/dL — ABNORMAL HIGH (ref 0.44–1.00)
GFR calc Af Amer: 49 mL/min — ABNORMAL LOW (ref >60)
GFR calc non Af Amer: 42 mL/min — ABNORMAL LOW (ref >60)
Glucose, Bld: 374 mg/dL — ABNORMAL HIGH (ref 65–99)
Potassium: 4.4 mmol/L (ref 3.5–5.1)
Sodium: 131 mmol/L — ABNORMAL LOW (ref 135–145)
Total Bilirubin: 2.1 mg/dL — ABNORMAL HIGH (ref 0.3–1.2)
Total Protein: 8.1 g/dL (ref 6.5–8.1)

## 2014-06-03 LAB — CBC WITH DIFFERENTIAL/PLATELET
Basophils Absolute: 0 10*3/uL (ref 0.0–0.1)
Basophils Relative: 0 % (ref 0–1)
Eosinophils Absolute: 0 10*3/uL (ref 0.0–0.7)
Eosinophils Relative: 0 % (ref 0–5)
HCT: 41.1 % (ref 36.0–46.0)
Hemoglobin: 13.2 g/dL (ref 12.0–15.0)
Lymphocytes Relative: 6 % — ABNORMAL LOW (ref 12–46)
Lymphs Abs: 1.1 10*3/uL (ref 0.7–4.0)
MCH: 29.4 pg (ref 26.0–34.0)
MCHC: 32.1 g/dL (ref 30.0–36.0)
MCV: 91.5 fL (ref 78.0–100.0)
Monocytes Absolute: 3.5 10*3/uL — ABNORMAL HIGH (ref 0.1–1.0)
Monocytes Relative: 17 % — ABNORMAL HIGH (ref 3–12)
Neutro Abs: 15.4 10*3/uL — ABNORMAL HIGH (ref 1.7–7.7)
Neutrophils Relative %: 77 % (ref 43–77)
Platelets: 295 10*3/uL (ref 150–400)
RBC: 4.49 MIL/uL (ref 3.87–5.11)
RDW: 13.6 % (ref 11.5–15.5)
WBC: 20 10*3/uL — ABNORMAL HIGH (ref 4.0–10.5)

## 2014-06-03 LAB — URINALYSIS, ROUTINE W REFLEX MICROSCOPIC
Glucose, UA: 250 mg/dL — AB
Hgb urine dipstick: NEGATIVE
Ketones, ur: NEGATIVE mg/dL
Leukocytes, UA: NEGATIVE
Nitrite: NEGATIVE
Protein, ur: NEGATIVE mg/dL
Specific Gravity, Urine: 1.019 (ref 1.005–1.030)
Urobilinogen, UA: 1 mg/dL (ref 0.0–1.0)
pH: 5.5 (ref 5.0–8.0)

## 2014-06-03 LAB — TROPONIN I

## 2014-06-03 MED ORDER — ACETAMINOPHEN 325 MG PO TABS
650.0000 mg | ORAL_TABLET | Freq: Once | ORAL | Status: AC
  Administered 2014-06-04: 650 mg via ORAL
  Filled 2014-06-03: qty 2

## 2014-06-03 MED ORDER — SODIUM CHLORIDE 0.9 % IV BOLUS (SEPSIS): 1000.0000 mL | Freq: Once | INTRAVENOUS | Status: DC

## 2014-06-03 NOTE — ED Notes (Signed)
CBG 372 per EMS

## 2014-06-03 NOTE — ED Notes (Signed)
Per EMS, pt complains of weakness and decreased appetite, daughter states that she's having some memory problems

## 2014-06-03 NOTE — ED Provider Notes (Signed)
CSN: [id number]     Arrival date & time 06/03/14  2048 History   First MD Initiated Contact with Patient 06/03/14 2057     Chief Complaint  Patient presents with  . Weakness    HPI     64 year old female with a history of low grade carcinoid tumor, unknown primary site, with mets to liver, left adrenal and mesenteric mets, stage IV, diagnosed on 08/24/13 through liver biopsy presents today with her daughter with complaints of weakness. Pts daughter reports that over the last two days pt has had a reduced appetite consuming only 700 calories of ensure today. She states that she has become less communicative, and has not been ambulating as much. She verbally express his weakness, and decreased appetite. Patient denies specific complaints of headache, shortness of breath, chest pain, urinary complaints, lower extremity pain swelling or edema. Patient's daughter reports that this is recurrent for her and that every so often she becomes this way not wanting to eat, with reduced energy and weakness. Chart review reveals that during primary care evaluation patient has multiple episodes of anorexia and decreased energy levels. Daughter denies acute mental status changes, and reports this is been a slow progression over the last few days; identical to previous presentations. She denies fever cough, nausea, vomiting, diarrhea. Patient reports right upper quadrant abdominal pain, this pain is chronic and likely due to liver metastases. Patient reports attempting oral pain medication at home with minimal improvement.  Past Medical History  Diagnosis Date  . Pneumonia   . Renal disorder     renal failure  . Obesity   . Hypertension   . Diabetes mellitus   . High cholesterol   . CHF (congestive heart failure)    Past Surgical History  Procedure Laterality Date  . Abdominal hysterectomy    . Knee reconstruction, medial patellar femoral ligament     History reviewed. No pertinent family history. History   Substance Use Topics  . Smoking status: Never Smoker   . Smokeless tobacco: Never Used  . Alcohol Use: No   OB History    No data available     Review of Systems  All other systems reviewed and are negative.   Allergies  Penicillins  Home Medications   Prior to Admission medications   Medication Sig Start Date End Date Taking? Authorizing Provider  acetaminophen (TYLENOL) 650 MG CR tablet Take 650 mg by mouth every 8 (eight) hours as needed for pain.    Historical Provider, MD  albuterol (PROVENTIL HFA;VENTOLIN HFA) 108 (90 BASE) MCG/ACT inhaler Inhale 2 puffs into the lungs every 6 (six) hours as needed for wheezing.     Historical Provider, MD  amLODipine-benazepril (LOTREL) 5-10 MG per capsule Take 1 capsule by mouth daily.    Historical Provider, MD  atenolol (TENORMIN) 25 MG tablet Take by mouth daily.    Historical Provider, MD  beclomethasone (QVAR) 80 MCG/ACT inhaler Inhale 1 puff into the lungs as needed (sob). breathing    Historical Provider, MD  Cranberry (HM CRANBERRY SUPER STRENGTH) 300 MG tablet Take 300 mg by mouth 2 (two) times daily.    Historical Provider, MD  feeding supplement, ENSURE COMPLETE, (ENSURE COMPLETE) LIQD Take 237 mLs by mouth 2 (two) times daily between meals. 04/01/13   Nishant Dhungel, MD  ferrous gluconate (FERGON) 324 MG tablet Take 324 mg by mouth 2 (two) times daily with a meal.    Historical Provider, MD  furosemide (LASIX) 40 MG tablet Take 1  tablet (40 mg total) by mouth daily. 10/29/11   Sheryle Hail, NP  gabapentin (NEURONTIN) 400 MG capsule Take 400 mg by mouth 3 (three) times daily.    Historical Provider, MD  Ginger, Zingiber officinalis, (GINGER ROOT) 550 MG CAPS Take 1 capsule by mouth daily.    Historical Provider, MD  insulin aspart (NOVOLOG) 100 UNIT/ML injection Inject 40-50 Units into the skin 3 (three) times daily before meals. Inject 50 units at breakfast and 40 units at  lunch and 40 units at dinner. Pt on sliding scale     Historical Provider, MD  insulin glargine (LANTUS) 100 UNIT/ML injection Inject 50 Units into the skin 2 (two) times daily. In the morning and at bedtime.    Historical Provider, MD  LORazepam (ATIVAN) 0.5 MG tablet Take 0.5 mg by mouth at bedtime as needed for anxiety.    Historical Provider, MD  ondansetron (ZOFRAN ODT) 4 MG disintegrating tablet Take 1 tablet (4 mg total) by mouth every 8 (eight) hours as needed for nausea or vomiting. 08/28/13   Velvet Bathe, MD  oxyCODONE-acetaminophen (PERCOCET) 10-325 MG per tablet Take 1 tablet by mouth every 8 (eight) hours as needed for pain. 03/03/14   Truitt Merle, MD  spironolactone (ALDACTONE) 25 MG tablet Take 25 mg by mouth daily.    Historical Provider, MD  vitamin C (ASCORBIC ACID) 500 MG tablet Take 1 tablet (500 mg total) by mouth daily. 09/02/13   Bernadene Bell, MD  Zinc 50 MG TABS Take 1 tablet (50 mg total) by mouth daily. 09/02/13   Aasim Lona Kettle, MD   BP 149/73 mmHg  Pulse 85  Temp(Src) 100.2 F (37.9 C) (Oral)  Resp 18  SpO2 99%   Physical Exam  Constitutional: She appears well-developed and well-nourished.  HENT:  Head: Normocephalic and atraumatic.  Eyes: Conjunctivae are normal. Pupils are equal, round, and reactive to light. Right eye exhibits no discharge. Left eye exhibits no discharge. No scleral icterus.  Neck: Normal range of motion. Neck supple. No JVD present. No tracheal deviation present.  Cardiovascular: Normal rate, regular rhythm, normal heart sounds and intact distal pulses.  Exam reveals no gallop and no friction rub.   No murmur heard. Pulmonary/Chest: Effort normal. No stridor.  Abdominal: Soft. Bowel sounds are normal. She exhibits no distension and no mass. There is no tenderness. There is no rebound and no guarding.  Minimal right upper quadrant pain to palpation remainder of abdominal exam benign  Musculoskeletal:  Bilateral lower extremity edema. Reduced strength throughout, no focal strength or neuro deficits.   Lymphadenopathy:    She has no cervical adenopathy.  Neurological: She is alert. She displays no atrophy and no tremor. No cranial nerve deficit or sensory deficit. She exhibits normal muscle tone. She displays no seizure activity. GCS eye subscore is 4. GCS verbal subscore is 5. GCS motor subscore is 6.  Difficult neuro exam due to patient cooperation  Psychiatric: She has a normal mood and affect. Her behavior is normal. Judgment and thought content normal.  Nursing note and vitals reviewed.   ED Course  Procedures (including critical care time) Labs Review Labs Reviewed - No data to display  Imaging Review No results found.   EKG Interpretation   Date/Time:  Friday Jun 03 2014 20:56:54 EDT Ventricular Rate:  85 PR Interval:  157 QRS Duration: 86 QT Interval:  374 QTC Calculation: 445 R Axis:   -7 Text Interpretation:  Sinus rhythm LVH by voltage Borderline T  abnormalities,  diffuse leads Since last tracing T wave abnormality is new  Confirmed by Navos  MD, ELLIOTT [phone]) on 06/03/2014 10:02:03 PM      MDM   Final diagnoses:  Weakness    Labs: CBC, CMP, urinalysis, troponin- significant for leukocytosis at 20, hyponatremia at 131  Imaging: DG chest portable no significant findings  Consults: Hospitalist  Therapeutics: Tylenol  Assessment: Weakness  Plan: Patient presents with change in mental status, weakness, which reduced appetite. She had a low-grade fever during her ED stay which increased to 11.5. Her vital signs remained stable, no acute signs of infection including urine chest or physical exam were found. Patient's mental status improved throughout her ED stay, originally she was not communicating with me; Route the day she became more communicative and interactive. Hospitalist service was consulted to admit the patient for further workup and management of her change in mental status, inability to maintain by mouth intake. They personally evaluated the  patient and agreed for inpatient management.      Okey Regal, PA-C 06/04/14 1616  Daleen Bo, MD 06/04/14 [phone]

## 2014-06-03 NOTE — ED Notes (Signed)
In to attempt to start IV, this RN unsuccessful in IV start. Charge nurse notified, unable to start IV, IV consult placed at this time.

## 2014-06-03 NOTE — ED Notes (Signed)
PA @ bedside.

## 2014-06-03 NOTE — ED Notes (Signed)
Bed: EN40 Expected date: 06/03/14 Expected time: 8:27 PM Means of arrival: Ambulance Comments: Generalized weakness

## 2014-06-03 NOTE — ED Notes (Signed)
Unsuccessful blood draw x1 

## 2014-06-03 NOTE — ED Notes (Signed)
PA notified IV team unable to establish IV start

## 2014-06-04 ENCOUNTER — Emergency Department (HOSPITAL_COMMUNITY): Payer: Medicare Other

## 2014-06-04 DIAGNOSIS — D369 Benign neoplasm, unspecified site: Secondary | ICD-10-CM | POA: Diagnosis not present

## 2014-06-04 DIAGNOSIS — I5032 Chronic diastolic (congestive) heart failure: Secondary | ICD-10-CM | POA: Diagnosis present

## 2014-06-04 DIAGNOSIS — E118 Type 2 diabetes mellitus with unspecified complications: Secondary | ICD-10-CM

## 2014-06-04 DIAGNOSIS — G9340 Encephalopathy, unspecified: Secondary | ICD-10-CM | POA: Diagnosis not present

## 2014-06-04 DIAGNOSIS — R509 Fever, unspecified: Secondary | ICD-10-CM | POA: Diagnosis not present

## 2014-06-04 DIAGNOSIS — M8580 Other specified disorders of bone density and structure, unspecified site: Secondary | ICD-10-CM | POA: Diagnosis present

## 2014-06-04 LAB — CBC WITH DIFFERENTIAL/PLATELET
BASOS ABS: 0 10*3/uL (ref 0.0–0.1)
Basophils Relative: 0 % (ref 0–1)
EOS ABS: 0 10*3/uL (ref 0.0–0.7)
Eosinophils Relative: 0 % (ref 0–5)
HCT: 37.9 % (ref 36.0–46.0)
HEMOGLOBIN: 12.1 g/dL (ref 12.0–15.0)
LYMPHS ABS: 1.6 10*3/uL (ref 0.7–4.0)
LYMPHS PCT: 7 % — AB (ref 12–46)
MCH: 29.3 pg (ref 26.0–34.0)
MCHC: 31.9 g/dL (ref 30.0–36.0)
MCV: 91.8 fL (ref 78.0–100.0)
MONO ABS: 4.2 10*3/uL — AB (ref 0.1–1.0)
Monocytes Relative: 19 % — ABNORMAL HIGH (ref 3–12)
Neutro Abs: 16.3 10*3/uL — ABNORMAL HIGH (ref 1.7–7.7)
Neutrophils Relative %: 74 % (ref 43–77)
Platelets: 289 10*3/uL (ref 150–400)
RBC: 4.13 MIL/uL (ref 3.87–5.11)
RDW: 13.5 % (ref 11.5–15.5)
WBC: 22.1 10*3/uL — ABNORMAL HIGH (ref 4.0–10.5)

## 2014-06-04 LAB — AMMONIA: Ammonia: 29 umol/L (ref 9–35)

## 2014-06-04 LAB — BASIC METABOLIC PANEL
ANION GAP: 11 (ref 5–15)
ANION GAP: 10 (ref 5–15)
ANION GAP: 10 (ref 5–15)
Anion gap: 9 (ref 5–15)
Anion gap: 12 (ref 5–15)
BUN: 16 mg/dL (ref 6–20)
BUN: 16 mg/dL (ref 6–20)
BUN: 16 mg/dL (ref 6–20)
BUN: 16 mg/dL (ref 6–20)
BUN: 18 mg/dL (ref 6–20)
CALCIUM: 8.6 mg/dL — AB (ref 8.9–10.3)
CALCIUM: 8.7 mg/dL — AB (ref 8.9–10.3)
CHLORIDE: 99 mmol/L — AB (ref 101–111)
CHLORIDE: 99 mmol/L — AB (ref 101–111)
CO2: 26 mmol/L (ref 22–32)
CO2: 29 mmol/L (ref 22–32)
CO2: 26 mmol/L (ref 22–32)
CO2: 25 mmol/L (ref 22–32)
CO2: 27 mmol/L (ref 22–32)
CREATININE: 1.13 mg/dL — AB (ref 0.44–1.00)
CREATININE: 1.2 mg/dL — AB (ref 0.44–1.00)
CREATININE: 0.92 mg/dL (ref 0.44–1.00)
Calcium: 8.7 mg/dL — ABNORMAL LOW (ref 8.9–10.3)
Calcium: 8.7 mg/dL — ABNORMAL LOW (ref 8.9–10.3)
Calcium: 8.6 mg/dL — ABNORMAL LOW (ref 8.9–10.3)
Chloride: 96 mmol/L — ABNORMAL LOW (ref 101–111)
Chloride: 97 mmol/L — ABNORMAL LOW (ref 101–111)
Chloride: 99 mmol/L — ABNORMAL LOW (ref 101–111)
Creatinine, Ser: 1.15 mg/dL — ABNORMAL HIGH (ref 0.44–1.00)
Creatinine, Ser: 0.96 mg/dL (ref 0.44–1.00)
GFR calc Af Amer: 57 mL/min — ABNORMAL LOW (ref >60)
GFR calc Af Amer: 58 mL/min — ABNORMAL LOW (ref >60)
GFR calc Af Amer: 54 mL/min — ABNORMAL LOW (ref >60)
GFR calc Af Amer: >60 mL/min (ref >60)
GFR calc Af Amer: >60 mL/min (ref >60)
GFR calc non Af Amer: 46 mL/min — ABNORMAL LOW (ref >60)
GFR calc non Af Amer: >60 mL/min (ref >60)
GFR, EST NON AFRICAN AMERICAN: 49 mL/min — AB (ref >60)
GFR, EST NON AFRICAN AMERICAN: 50 mL/min — AB (ref >60)
GLUCOSE: 271 mg/dL — AB (ref 65–99)
GLUCOSE: 121 mg/dL — AB (ref 65–99)
Glucose, Bld: 319 mg/dL — ABNORMAL HIGH (ref 65–99)
Glucose, Bld: 230 mg/dL — ABNORMAL HIGH (ref 65–99)
Glucose, Bld: 154 mg/dL — ABNORMAL HIGH (ref 65–99)
Potassium: 4.3 mmol/L (ref 3.5–5.1)
Potassium: 4.6 mmol/L (ref 3.5–5.1)
Potassium: 4.5 mmol/L (ref 3.5–5.1)
Potassium: 4.4 mmol/L (ref 3.5–5.1)
Potassium: 4.3 mmol/L (ref 3.5–5.1)
SODIUM: 133 mmol/L — AB (ref 135–145)
SODIUM: 136 mmol/L (ref 135–145)
Sodium: 135 mmol/L (ref 135–145)
Sodium: 135 mmol/L (ref 135–145)
Sodium: 136 mmol/L (ref 135–145)

## 2014-06-04 LAB — COMPREHENSIVE METABOLIC PANEL
ALT: 43 U/L (ref 14–54)
AST: 50 U/L — AB (ref 15–41)
Albumin: 3.1 g/dL — ABNORMAL LOW (ref 3.5–5.0)
Alkaline Phosphatase: 516 U/L — ABNORMAL HIGH (ref 38–126)
Anion gap: 9 (ref 5–15)
BILIRUBIN TOTAL: 2.2 mg/dL — AB (ref 0.3–1.2)
BUN: 17 mg/dL (ref 6–20)
CALCIUM: 8.3 mg/dL — AB (ref 8.9–10.3)
CO2: 26 mmol/L (ref 22–32)
Chloride: 94 mmol/L — ABNORMAL LOW (ref 101–111)
Creatinine, Ser: 1.2 mg/dL — ABNORMAL HIGH (ref 0.44–1.00)
GFR calc Af Amer: 54 mL/min — ABNORMAL LOW (ref >60)
GFR, EST NON AFRICAN AMERICAN: 46 mL/min — AB (ref >60)
GLUCOSE: 360 mg/dL — AB (ref 65–99)
Potassium: 4.2 mmol/L (ref 3.5–5.1)
SODIUM: 129 mmol/L — AB (ref 135–145)
Total Protein: 8 g/dL (ref 6.5–8.1)

## 2014-06-04 LAB — PROTIME-INR
INR: 1.38 (ref 0.00–1.49)
Prothrombin Time: 17 seconds — ABNORMAL HIGH (ref 11.6–15.2)

## 2014-06-04 LAB — C-REACTIVE PROTEIN: CRP: 23.6 mg/dL — AB (ref <1.0)

## 2014-06-04 LAB — GLUCOSE, CAPILLARY
GLUCOSE-CAPILLARY: 301 mg/dL — AB (ref 65–99)
Glucose-Capillary: 324 mg/dL — ABNORMAL HIGH (ref 65–99)
Glucose-Capillary: 284 mg/dL — ABNORMAL HIGH (ref 65–99)
Glucose-Capillary: 219 mg/dL — ABNORMAL HIGH (ref 65–99)
Glucose-Capillary: 111 mg/dL — ABNORMAL HIGH (ref 65–99)

## 2014-06-04 LAB — LACTIC ACID, PLASMA: LACTIC ACID, VENOUS: 1.1 mmol/L (ref 0.5–2.0)

## 2014-06-04 LAB — VITAMIN B12: Vitamin B-12: 508 pg/mL (ref 180–914)

## 2014-06-04 LAB — TSH: TSH: 0.967 u[IU]/mL (ref 0.350–4.500)

## 2014-06-04 LAB — SEDIMENTATION RATE: SED RATE: 107 mm/h — AB (ref 0–22)

## 2014-06-04 LAB — OSMOLALITY: OSMOLALITY: 292 mosm/kg (ref 275–300)

## 2014-06-04 LAB — FOLATE: Folate: 9.9 ng/mL (ref >5.9)

## 2014-06-04 LAB — LIPASE, BLOOD: LIPASE: 11 U/L — AB (ref 22–51)

## 2014-06-04 MED ORDER — INSULIN GLARGINE 100 UNIT/ML ~~LOC~~ SOLN
55.0000 [IU] | Freq: Two times a day (BID) | SUBCUTANEOUS | Status: DC
  Administered 2014-06-04 – 2014-06-06 (×4): 55 [IU] via SUBCUTANEOUS
  Filled 2014-06-04 (×5): qty 0.55

## 2014-06-04 MED ORDER — INSULIN ASPART 100 UNIT/ML ~~LOC~~ SOLN
0.0000 [IU] | Freq: Every day | SUBCUTANEOUS | Status: DC
  Administered 2014-06-04: 4 [IU] via SUBCUTANEOUS

## 2014-06-04 MED ORDER — INSULIN GLARGINE 100 UNIT/ML ~~LOC~~ SOLN
50.0000 [IU] | Freq: Two times a day (BID) | SUBCUTANEOUS | Status: DC
  Administered 2014-06-04: 50 [IU] via SUBCUTANEOUS
  Filled 2014-06-04 (×2): qty 0.5

## 2014-06-04 MED ORDER — ACETAMINOPHEN 650 MG RE SUPP: 650.0000 mg | Freq: Four times a day (QID) | RECTAL | Status: DC | PRN

## 2014-06-04 MED ORDER — AMLODIPINE BESYLATE 5 MG PO TABS
5.0000 mg | ORAL_TABLET | Freq: Every day | ORAL | Status: DC
  Administered 2014-06-04 – 2014-06-11 (×7): 5 mg via ORAL
  Filled 2014-06-04 (×7): qty 1

## 2014-06-04 MED ORDER — ALBUTEROL SULFATE (2.5 MG/3ML) 0.083% IN NEBU: 2.5000 mg | INHALATION_SOLUTION | Freq: Four times a day (QID) | RESPIRATORY_TRACT | Status: DC | PRN

## 2014-06-04 MED ORDER — TRAMADOL HCL 50 MG PO TABS
50.0000 mg | ORAL_TABLET | Freq: Two times a day (BID) | ORAL | Status: DC | PRN
  Administered 2014-06-04 – 2014-06-09 (×2): 50 mg via ORAL
  Filled 2014-06-04 (×2): qty 1

## 2014-06-04 MED ORDER — BUDESONIDE 0.25 MG/2ML IN SUSP: 0.2500 mg | Freq: Every day | RESPIRATORY_TRACT | Status: DC | PRN

## 2014-06-04 MED ORDER — INSULIN ASPART 100 UNIT/ML ~~LOC~~ SOLN
10.0000 [IU] | Freq: Three times a day (TID) | SUBCUTANEOUS | Status: DC
  Administered 2014-06-04 – 2014-06-06 (×4): 10 [IU] via SUBCUTANEOUS

## 2014-06-04 MED ORDER — METRONIDAZOLE 500 MG PO TABS
500.0000 mg | ORAL_TABLET | Freq: Three times a day (TID) | ORAL | Status: DC
  Administered 2014-06-04 – 2014-06-05 (×3): 500 mg via ORAL
  Filled 2014-06-04 (×3): qty 1

## 2014-06-04 MED ORDER — SODIUM CHLORIDE 0.9 % IV SOLN
INTRAVENOUS | Status: DC
  Administered 2014-06-07 – 2014-06-08 (×2): via INTRAVENOUS

## 2014-06-04 MED ORDER — METRONIDAZOLE IN NACL 5-0.79 MG/ML-% IV SOLN
500.0000 mg | Freq: Three times a day (TID) | INTRAVENOUS | Status: DC
  Administered 2014-06-04: 500 mg via INTRAVENOUS
  Filled 2014-06-04: qty 100

## 2014-06-04 MED ORDER — INSULIN ASPART 100 UNIT/ML ~~LOC~~ SOLN
0.0000 [IU] | Freq: Three times a day (TID) | SUBCUTANEOUS | Status: DC
  Administered 2014-06-04: 11 [IU] via SUBCUTANEOUS
  Administered 2014-06-04: 7 [IU] via SUBCUTANEOUS
  Administered 2014-06-04: 15 [IU] via SUBCUTANEOUS
  Administered 2014-06-05: 3 [IU] via SUBCUTANEOUS
  Administered 2014-06-07: 4 [IU] via SUBCUTANEOUS

## 2014-06-04 MED ORDER — SPIRONOLACTONE 25 MG PO TABS
25.0000 mg | ORAL_TABLET | Freq: Every day | ORAL | Status: DC
  Administered 2014-06-04 – 2014-06-05 (×2): 25 mg via ORAL
  Filled 2014-06-04 (×2): qty 1

## 2014-06-04 MED ORDER — ALBUTEROL SULFATE HFA 108 (90 BASE) MCG/ACT IN AERS: 2.0000 | INHALATION_SPRAY | Freq: Four times a day (QID) | RESPIRATORY_TRACT | Status: DC | PRN

## 2014-06-04 MED ORDER — CIPROFLOXACIN IN D5W 400 MG/200ML IV SOLN
400.0000 mg | Freq: Two times a day (BID) | INTRAVENOUS | Status: DC
  Administered 2014-06-04: 400 mg via INTRAVENOUS
  Filled 2014-06-04: qty 200

## 2014-06-04 MED ORDER — ONDANSETRON HCL 4 MG/2ML IJ SOLN: 4.0000 mg | Freq: Four times a day (QID) | INTRAMUSCULAR | Status: DC | PRN

## 2014-06-04 MED ORDER — SODIUM CHLORIDE 0.9 % IJ SOLN
3.0000 mL | Freq: Two times a day (BID) | INTRAMUSCULAR | Status: DC
  Administered 2014-06-04: 3 mL via INTRAVENOUS

## 2014-06-04 MED ORDER — FERROUS GLUCONATE 324 (38 FE) MG PO TABS
324.0000 mg | ORAL_TABLET | Freq: Two times a day (BID) | ORAL | Status: DC
  Administered 2014-06-04 – 2014-06-11 (×14): 324 mg via ORAL
  Filled 2014-06-04 (×15): qty 1

## 2014-06-04 MED ORDER — ONDANSETRON HCL 4 MG PO TABS: 4.0000 mg | ORAL_TABLET | Freq: Four times a day (QID) | ORAL | Status: DC | PRN

## 2014-06-04 MED ORDER — BECLOMETHASONE DIPROPIONATE 80 MCG/ACT IN AERS
1.0000 | INHALATION_SPRAY | RESPIRATORY_TRACT | Status: DC | PRN
  Filled 2014-06-04: qty 8.7

## 2014-06-04 MED ORDER — BECLOMETHASONE DIPROPIONATE 80 MCG/ACT IN AERS: 1.0000 | INHALATION_SPRAY | RESPIRATORY_TRACT | Status: DC | PRN

## 2014-06-04 MED ORDER — ATENOLOL 25 MG PO TABS
25.0000 mg | ORAL_TABLET | Freq: Every day | ORAL | Status: DC
  Administered 2014-06-04 – 2014-06-11 (×6): 25 mg via ORAL
  Filled 2014-06-04 (×7): qty 1

## 2014-06-04 MED ORDER — HEPARIN SODIUM (PORCINE) 5000 UNIT/ML IJ SOLN
5000.0000 [IU] | Freq: Three times a day (TID) | INTRAMUSCULAR | Status: AC
  Administered 2014-06-04 – 2014-06-07 (×11): 5000 [IU] via SUBCUTANEOUS
  Filled 2014-06-04 (×11): qty 1

## 2014-06-04 MED ORDER — INSULIN ASPART 100 UNIT/ML ~~LOC~~ SOLN: 0.0000 [IU] | Freq: Three times a day (TID) | SUBCUTANEOUS | Status: DC

## 2014-06-04 MED ORDER — ACETAMINOPHEN 325 MG PO TABS
650.0000 mg | ORAL_TABLET | Freq: Four times a day (QID) | ORAL | Status: DC | PRN
  Administered 2014-06-04 – 2014-06-06 (×3): 650 mg via ORAL
  Filled 2014-06-04 (×3): qty 2

## 2014-06-04 MED ORDER — GABAPENTIN 400 MG PO CAPS
400.0000 mg | ORAL_CAPSULE | Freq: Three times a day (TID) | ORAL | Status: DC
  Administered 2014-06-04 – 2014-06-11 (×20): 400 mg via ORAL
  Filled 2014-06-04 (×20): qty 1

## 2014-06-04 MED ORDER — CIPROFLOXACIN HCL 500 MG PO TABS
500.0000 mg | ORAL_TABLET | Freq: Two times a day (BID) | ORAL | Status: DC
  Administered 2014-06-04 – 2014-06-05 (×2): 500 mg via ORAL
  Filled 2014-06-04 (×2): qty 1

## 2014-06-04 MED ORDER — PEG-KCL-NACL-NASULF-NA ASC-C 100 G PO SOLR
1.0000 | Freq: Once | ORAL | Status: AC
  Administered 2014-06-04: 200 g via ORAL
  Filled 2014-06-04: qty 1

## 2014-06-04 NOTE — Progress Notes (Addendum)
[facility] PROGRESS NOTE  Yesenia Lyons TWS:[id number] DOB: [birth year] DOA: 06/03/2014 PCP: Isaias Cowman, PA-C  Same Day Note  Assessment/Plan: 1. Acute encephalopathy 1. Resolved 2. Question medication induced vs toxic-metabolic encephalopathy from acute infection 2. Metastatic Carcinoid tumor 1. Followed by Dr. Truitt Merle 2. Pt on Octreotide 3. Elevated transaminases, elevated bilirubin 1. LFT's overall look stable, however total bili is rising 2. Recent CT abd on 5/26 is unremarkable for biliary obstruction 3. Will check abd [place] to r/o cholecystitis vs acute biliary obstruction in setting of leukocytosis and fevers on admit 4. Pseudohyponatremia with hyperglycemia 1. Improved with improving serum glucose 2. Continue to titrate insulin as needed (see below) 5. DM2 1. Glucose improved but still in the mid 200's 2. Will increase lantus from 50 units BID to 55 units BID 3. Will add 10 units aspart before each meal 4. Continue SSI coverage 6. HTN 1. BP stable 7. Chronic diastolic CHF 1. Compensated on exam 2. Cont to monitor 8. Sepsis from unclear source, possible cholecystitis 1. Pt with fevers on presentation with WBC of 22k and elevated 2. Per above, total bili is newly elevated with RUQ pain 3. Abd symptoms seem to be chronic and may certainly be secondary to known malignant carcinoid, however given fevers and leukocytosis, will empirically treat for cholecystitis for now and check RUQ [place] 9. DVT prophylaxis 1. Heparin subQ 10. Constipation 1. Recent CT abd from 5/26 personally reviewed with large stool noted in the ascending through transverse colons 2. Will give a trial of cathartic  Code Status: Full Family Communication: Pt in room, daughter at bedside (indicate person spoken with, relationship, and if by phone, the number) Disposition Plan: Pending   Consultants:    Procedures:    Antibiotics:  Ciprofloxacin 5/28>>>  Flagyl  5/28>>>  HPI/Subjective: Complains of marked epigastric and RUQ pain  Objective: Filed Vitals:   06/04/14 0205 06/04/14 0247 06/04/14 0544 06/04/14 1406  BP:  139/65 119/66 148/94  Pulse:  73 76 79  Temp: 99.5 F (37.5 C) 97.7 F (36.5 C) 97.9 F (36.6 C) 97.5 F (36.4 C)  TempSrc: Oral Oral Oral Oral  Resp:  19 18 22   Height:  5\' 10"  (1.778 m)    Weight:  147 kg (324 lb 1.2 oz)    SpO2:  100% 100% 100%    Intake/Output Summary (Last 24 hours) at 06/04/14 1525 Last data filed at 06/04/14 1300  Gross per 24 hour  Intake    600 ml  Output      0 ml  Net    600 ml   Filed Weights   06/04/14 0247  Weight: 147 kg (324 lb 1.2 oz)    Exam:   General:  Awake,appears to be in pain  Cardiovascular: regular, s1, s2  Respiratory: normal resp effort, no wheeing  Abdomen: obese, decreased BS, tender over epigastric region and RUQ  Musculoskeletal: Perfused, no clubbing   Data Reviewed: Basic Metabolic Panel:  Recent Labs Lab 06/03/14 2240 06/04/14 0232 06/04/14 0829 06/04/14 1230  NA 131* 129* 133* 135  K 4.4 4.2 4.3 4.6  CL 95* 94* 96* 97*  CO2 26 26 26 29   GLUCOSE 374* 360* 319* 271*  BUN 16 17 16 16   CREATININE 1.29* 1.20* 1.15* 1.13*  CALCIUM 8.5* 8.3* 8.7* 8.7*   Liver Function Tests:  Recent Labs Lab 06/03/14 2240 06/04/14 0232  AST 55* 50*  ALT 44 43  ALKPHOS 530* 516*  BILITOT 2.1* 2.2*  PROT 8.1  8.0  ALBUMIN 3.0* 3.1*    Recent Labs Lab 06/04/14 0232  LIPASE 11*    Recent Labs Lab 06/04/14 0230  AMMONIA 29   CBC:  Recent Labs Lab 06/03/14 2240 06/04/14 0232  WBC 20.0* 22.1*  NEUTROABS 15.4* 16.3*  HGB 13.2 12.1  HCT 41.1 37.9  MCV 91.5 91.8  PLT 295 289   Cardiac Enzymes:  Recent Labs Lab 06/03/14 2240  TROPONINI <0.03   BNP (last 3 results) No results for input(s): BNP in the last 8760 hours.  ProBNP (last 3 results) No results for input(s): PROBNP in the last 8760 hours.  CBG:  Recent Labs Lab  06/04/14 0318 06/04/14 0742 06/04/14 1139  GLUCAP 324* 301* 284*    No results found for this or any previous visit (from the past 240 hour(s)).   Studies: Ct Head Wo Contrast  06/04/2014   CLINICAL DATA:  Weakness and decreased appetite. Memory problems. Acute encephalopathy.  EXAM: CT HEAD WITHOUT CONTRAST  TECHNIQUE: Contiguous axial images were obtained from the base of the skull through the vertex without intravenous contrast.  COMPARISON:  None.  FINDINGS: Skull and Sinuses:Extensive hyperostosis, especially in the frontal region with crowding of the frontal lobes.  There has been endoscopic sinus surgery on the right with opening of the maxillary and ethmoid sinuses. 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 No evidence of acute sinusitis.  Orbits: No acute abnormality.  Brain: Expanded low-density sella consistent with a empty sella. Based on patient's age this is considered incidental.  There is inferior right frontal well circumscribed low-density (including gyrus rectus) appearance suggesting gliosis and location suggesting previous trauma. Hyperostosis crowds the frontal lobes bilaterally with diffuse sulcal effacement. No evidence of acute infarct, hemorrhage, hydrocephalus, or mass lesion.  IMPRESSION: 1. No acute findings. 2. Right inferior frontal gliosis, location favoring remote contusion. 3. 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   Electronically Signed   By: Monte Fantasia M.D.   On: 06/04/2014 01:44   Dg Chest Port 1 View  06/03/2014   CLINICAL DATA:  Weakness  EXAM: PORTABLE CHEST - 1 VIEW  COMPARISON:  10/30/2013  FINDINGS: Cardiac shadow is enlarged. The overall inspiratory effort is poor with bibasilar atelectatic changes. No acute bony abnormality is noted.  IMPRESSION: Poor inspiratory effort with bibasilar atelectasis.   Electronically Signed   By: Inez Catalina M.D.   On: 06/03/2014 21:44     Scheduled Meds: . amLODipine  5 mg Oral Daily  . atenolol  25 mg Oral Daily  . ciprofloxacin  400 mg Intravenous Q12H  . ferrous gluconate  324 mg Oral BID WC  . gabapentin  400 mg Oral TID  . heparin  5,000 Units Subcutaneous 3 times per day  . insulin aspart  0-20 Units Subcutaneous TID WC  . insulin aspart  0-5 Units Subcutaneous QHS  . insulin aspart  10 Units Subcutaneous TID WC  . insulin glargine  55 Units Subcutaneous BID  . metronidazole  500 mg Intravenous 3 times per day  . sodium chloride  3 mL Intravenous Q12H  . spironolactone  25 mg Oral Daily   Continuous Infusions: . 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 If 7PM-7AM, please contact night-coverage at [email or web address], password [facility] 06/04/2014, 3:25 PM

## 2014-06-04 NOTE — ED Notes (Signed)
phelbotomy at bedside, will transport after blood draw

## 2014-06-04 NOTE — H&P (Addendum)
[facility] History and Physical  Patient: Yesenia Lyons  MRN: [id number]  DOB: 10/26/1950  DOS: the patient was seen and examined on 06/04/2014 PCP: Tyna Jaksch  Referring physician: Dr. Eulis Foster Chief Complaint: Confusion  HPI: Yesenia Lyons is a 64 y.o. female with Past medical history of obesity, hypertension, diabetes mellitus type 2, chronic diastolic CHF, carcinoid syndrome. The patient presents with complaints of confusion. The history was up and from patient's daughter who is also primary caregiver. Patient was at her baseline when she woke up in the morning. Later on during the day she has been acting confused and was unable to identify the daughter. She also has reduced appetite since last few days. She has been less communicative and not ambulating. Patient denies any complaints of headache, dizziness, shortness of breath, chest pain, vomiting, diarrhea, burning urination. Patient complains of abdominal pain which is chronic for her and unchanged. There is no fall trauma or injury reported. There is no recent change in her medications reported. Patient complains of some constipation which is also unchanged. Patient denies any blood in her bowels or urine.  The patient is coming from home And at her baseline independent for most of her ADL.  Review of Systems: as mentioned in the history of present illness.  A comprehensive review of the other systems is negative.  Past Medical History  Diagnosis Date  . Pneumonia   . Renal disorder     renal failure  . Obesity   . Hypertension   . Diabetes mellitus   . High cholesterol   . CHF (congestive heart failure)    Past Surgical History  Procedure Laterality Date  . Abdominal hysterectomy    . Knee reconstruction, medial patellar femoral ligament     Social History:  reports that she has never smoked. She has never used smokeless tobacco. She reports that she does not drink alcohol or use illicit drugs.  Allergies   Allergen Reactions  . Penicillins Itching and Swelling    History reviewed. No pertinent family history.  Prior to Admission medications   Medication Sig Start Date End Date Taking? Authorizing Provider  albuterol (PROVENTIL HFA;VENTOLIN HFA) 108 (90 BASE) MCG/ACT inhaler Inhale 2 puffs into the lungs every 6 (six) hours as needed for wheezing (wheezing).    Yes Historical Provider, MD  amLODipine-benazepril (LOTREL) 5-10 MG per capsule Take 1 capsule by mouth daily.   Yes Historical Provider, MD  atenolol (TENORMIN) 25 MG tablet Take 25 mg by mouth daily.    Yes Historical Provider, MD  beclomethasone (QVAR) 80 MCG/ACT inhaler Inhale 1 puff into the lungs as needed (sob). breathing   Yes Historical Provider, MD  feeding supplement, ENSURE COMPLETE, (ENSURE COMPLETE) LIQD Take 237 mLs by mouth 2 (two) times daily between meals. Patient taking differently: Take 237 mLs by mouth daily as needed (nutrition).  04/01/13  Yes Nishant Dhungel, MD  furosemide (LASIX) 40 MG tablet Take 1 tablet (40 mg total) by mouth daily. 10/29/11  Yes Sheryle Hail, NP  gabapentin (NEURONTIN) 400 MG capsule Take 400 mg by mouth 3 (three) times daily.   Yes Historical Provider, MD  insulin aspart (NOVOLOG) 100 UNIT/ML injection Inject 40-50 Units into the skin 3 (three) times daily before meals. Inject 50 units at breakfast and 40 units at  lunch and 40 units at dinner. Pt on sliding scale   Yes Historical Provider, MD  insulin glargine (LANTUS) 100 UNIT/ML injection Inject 50 Units into the skin 2 (two)  times daily. In the morning and at bedtime.   Yes Historical Provider, MD  ondansetron (ZOFRAN ODT) 4 MG disintegrating tablet Take 1 tablet (4 mg total) by mouth every 8 (eight) hours as needed for nausea or vomiting. 08/28/13  Yes Velvet Bathe, MD  oxyCODONE-acetaminophen (PERCOCET) 10-325 MG per tablet Take 1 tablet by mouth every 8 (eight) hours as needed for pain. 03/03/14  Yes Truitt Merle, MD  spironolactone  (ALDACTONE) 25 MG tablet Take 25 mg by mouth daily.   Yes Historical Provider, MD  vitamin C (ASCORBIC ACID) 500 MG tablet Take 1 tablet (500 mg total) by mouth daily. 09/02/13  Yes Aasim Lona Kettle, MD  ferrous gluconate (FERGON) 324 MG tablet Take 324 mg by mouth 2 (two) times daily with a meal.    Historical Provider, MD  Ginger, Zingiber officinalis, (GINGER ROOT) 550 MG CAPS Take 1 capsule by mouth daily as needed (nutrition).     Historical Provider, MD  Zinc 50 MG TABS Take 1 tablet (50 mg total) by mouth daily. Patient not taking: Reported on 06/03/2014 09/02/13   Bernadene Bell, MD    Physical Exam: Filed Vitals:   06/04/14 0201 06/04/14 0205 06/04/14 0247 06/04/14 0544  BP: 140/81  139/65 119/66  Pulse: 72  73 76  Temp:  99.5 F (37.5 C) 97.7 F (36.5 C) 97.9 F (36.6 C)  TempSrc:  Oral Oral Oral  Resp: 22  19 18   Height:   5\' 10"  (1.778 m)   Weight:   147 kg (324 lb 1.2 oz)   SpO2: 99%  100% 100%    General: Alert, Awake and Oriented to Time, Place and Person. Appear in mild distress Eyes: PERRL ENT: Oral Mucosa clear moist. Neck: no JVD Cardiovascular: S1 and S2 Present, aortic systolic Murmur, Peripheral Pulses Present Respiratory: Bilateral Air entry equal and Decreased,  Clear to Auscultation, no Crackles, no wheezes Abdomen: Bowel Sound present, Soft and diffusely tender Skin: no Rash Extremities: Bilateral Pedal edema, no calf tenderness Neurologic: Grossly no focal neuro deficit.  Labs on Admission:  CBC:  Recent Labs Lab 06/03/14 2240 06/04/14 0232  WBC 20.0* 22.1*  NEUTROABS 15.4* 16.3*  HGB 13.2 12.1  HCT 41.1 37.9  MCV 91.5 91.8  PLT 295 289    CMP     Component Value Date/Time   NA 129* 06/04/2014 0232   NA 139 05/26/2014 1513   K 4.2 06/04/2014 0232   K 3.9 05/26/2014 1513   CL 94* 06/04/2014 0232   CO2 26 06/04/2014 0232   CO2 25 05/26/2014 1513   GLUCOSE 360* 06/04/2014 0232   GLUCOSE 252* 05/26/2014 1513   BUN 17 06/04/2014 0232   BUN  10.2 05/26/2014 1513   CREATININE 1.20* 06/04/2014 0232   CREATININE 1.0 05/26/2014 1513   CALCIUM 8.3* 06/04/2014 0232   CALCIUM 8.8 05/26/2014 1513   PROT 8.0 06/04/2014 0232   PROT 7.8 05/26/2014 1513   ALBUMIN 3.1* 06/04/2014 0232   ALBUMIN 2.9* 05/26/2014 1513   AST 50* 06/04/2014 0232   AST 38* 05/26/2014 1513   ALT 43 06/04/2014 0232   ALT 37 05/26/2014 1513   ALKPHOS 516* 06/04/2014 0232   ALKPHOS 564* 05/26/2014 1513   BILITOT 2.2* 06/04/2014 0232   BILITOT 1.94* 05/26/2014 1513   GFRNONAA 46* 06/04/2014 0232   GFRAA 54* 06/04/2014 0232     Recent Labs Lab 06/04/14 0232  LIPASE 11*     Recent Labs Lab 06/03/14 2240  TROPONINI <0.03   BNP (  last 3 results) No results for input(s): BNP in the last 8760 hours.  ProBNP (last 3 results) No results for input(s): PROBNP in the last 8760 hours.   Radiological Exams on Admission: Ct Head Wo Contrast  06/04/2014   CLINICAL DATA:  Weakness and decreased appetite. Memory problems. Acute encephalopathy.  EXAM: CT HEAD WITHOUT CONTRAST  TECHNIQUE: Contiguous axial images were obtained from the base of the skull through the vertex without intravenous contrast.  COMPARISON:  None.  FINDINGS: Skull and Sinuses:Extensive hyperostosis, especially in the frontal region with crowding of the frontal lobes.  There has been endoscopic sinus surgery on the right with opening of the maxillary and ethmoid sinuses. 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 No evidence of acute sinusitis.  Orbits: No acute abnormality.  Brain: Expanded low-density sella consistent with a empty sella. Based on patient's age this is considered incidental.  There is inferior right frontal well circumscribed low-density (including gyrus rectus) appearance suggesting gliosis and location suggesting previous trauma. Hyperostosis crowds the frontal lobes bilaterally with diffuse sulcal effacement. No evidence of acute  infarct, hemorrhage, hydrocephalus, or mass lesion.  IMPRESSION: 1. No acute findings. 2. Right inferior frontal gliosis, location favoring remote contusion. 3. 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   Electronically Signed   By: Monte Fantasia M.D.   On: 06/04/2014 01:44   Dg Chest Port 1 View  06/03/2014   CLINICAL DATA:  Weakness  EXAM: PORTABLE CHEST - 1 VIEW  COMPARISON:  10/30/2013  FINDINGS: Cardiac shadow is enlarged. The overall inspiratory effort is poor with bibasilar atelectatic changes. No acute bony abnormality is noted.  IMPRESSION: Poor inspiratory effort with bibasilar atelectasis.   Electronically Signed   By: Inez Catalina M.D.   On: 06/03/2014 21:44   EKG: Independently reviewed. normal sinus rhythm, nonspecific ST and T waves changes.  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 Acute encephalopathy The patient presented with complaints of confusion. Workup shows that she has leukocytosis, fever, lactic acidosis, mild increase in serum creatinine. She also has chronic worsening of her LFT secondary to her carcinoid syndrome metastasis. Currently the etiology of her acute encephalopathy is unclear. I will check causes for metabolic encephalopathy with ammonia and TSH folic acid P-61. I would also get a CAT scan of her head to rule out any acute intracranial abnormality. She will be admitted for telemetry.  2. Pseudohyponatremia with hyperglycemia. Diabetes mellitus type 2. Hemoglobin A1c was 7.7 in August. Recheck hemoglobin A1c. Continue Lantus. Placing her on insulin sliding scale. Holding her home NovoLog due to poor oral intake.  3. Essential hypertension. Continuing home  medications.  4. Chronic diastolic dysfunction. Elevated lactic acid levels. Due to patient's elevated lactic acid level I will be holding off on patient home Lasix and Aldactone. Resume as needed. Monitor ins and outs and daily weight.  5. Fever. The patient had fever initially at the time of presentation. Chest x-ray is clear for any acute abnormality. Urine analysis does not show any acute abnormality as well. The patient does not have any new abdominal tenderness. LFTs are stable. Etiology of the fever is currently unclear. Continue close monitoring off off antibodies  Advance goals of care discussion: Full code   DVT Prophylaxis: subcutaneous Heparin Nutrition: Cardiac and diabetic diet  Family Communication: family was present at bedside, opportunity was given to ask question and all questions were answered satisfactorily at the time of interview. Disposition: Admitted as observation, telemetry unit.  Author: Berle Mull, MD [facility] Pager: [phone] 06/04/2014   Addendum: The patient's CAT scan is showing hyperostosis frontalis with mass effect on the frontal lobes. I discussed with neurosurgery, their opinion and is hyperostosis frontalis appears to be a chronic finding in patient with carcinoid tumor and mass effect on the frontal lobe would not explain patient's current presentation. Recommend continue close monitoring and workup etiologies for her acute encephalopathy.  Yesenia Lyons 7:06 AM 06/04/2014    If 7PM-7AM, please contact night-coverage [email or web address] Password [email or web address]

## 2014-06-04 NOTE — Progress Notes (Signed)
ANTIBIOTIC CONSULT NOTE - INITIAL  Pharmacy Consult for Cipro Indication: Intra-abdominal infection  Allergies  Allergen Reactions  . Penicillins Itching and Swelling    Patient Measurements: Height: _0  (177.8 cm) Weight: (!) 324 lb 1.2 oz (147 kg) IBW/kg (Calculated) : 68.5   Vital Signs: Temp: 97.9 F (36.6 C) (05/28 0544) Temp Source: Oral (05/28 0544) BP: 119/66 mmHg (05/28 0544) Pulse Rate: 76 (05/28 0544) Intake/Output from previous day: 05/27 0701 - 05/28 0700 In: 240 [P.O.:240] Out: -  Intake/Output from this shift: Total I/O In: 120 [P.O.:120] Out: -   Labs:  Recent Labs  06/03/14 2240 06/04/14 0232 06/04/14 0829  WBC 20.0* 22.1*  --   HGB 13.2 12.1  --   PLT 295 289  --   CREATININE 1.29* 1.20* 1.15*   Estimated Creatinine Clearance: 76.9 mL/min (by C-G formula based on Cr of 1.15). No results for input(s): VANCOTROUGH, VANCOPEAK, VANCORANDOM, GENTTROUGH, GENTPEAK, GENTRANDOM, TOBRATROUGH, TOBRAPEAK, TOBRARND, AMIKACINPEAK, AMIKACINTROU, AMIKACIN in the last 72 hours.   Microbiology: No results found for this or any previous visit (from the past 720 hour(s)).  Medical History: Past Medical History  Diagnosis Date  . Pneumonia   . Renal disorder     renal failure  . Obesity   . Hypertension   . Diabetes mellitus   . High cholesterol   . CHF (congestive heart failure)     Medications:  Scheduled:  . amLODipine  5 mg Oral Daily  . atenolol  25 mg Oral Daily  . ciprofloxacin  400 mg Intravenous Q12H  . ferrous gluconate  324 mg Oral BID WC  . gabapentin  400 mg Oral TID  . heparin  5,000 Units Subcutaneous 3 times per day  . insulin aspart  0-20 Units Subcutaneous TID WC  . insulin aspart  0-5 Units Subcutaneous QHS  . insulin glargine  50 Units Subcutaneous BID  . metronidazole  500 mg Intravenous 3 times per day  . peg 3350 powder  1 kit Oral Once  . sodium chloride  3 mL Intravenous Q12H  . spironolactone  25 mg Oral Daily    Infusions:  . sodium chloride     PRN: acetaminophen **OR** acetaminophen, albuterol, budesonide (PULMICORT) nebulizer solution, ondansetron **OR** ondansetron (ZOFRAN) IV, traMADol Assessment: 64 yo female who presented yesterday with complaints of confusion and workup showed leukocytosis, fever, and lactic acidosis. Flagyl and Cipro to be started today for intra-abdominal infection.  Goal of Therapy:  Eradication of infection  Plan:  Will start Cipro 484m IV Q12h  Will f/u Scr and adjust dose as needed.   FGarnet Sierras PharmD 06/04/2014,12:36 PM

## 2014-06-04 NOTE — ED Provider Notes (Signed)
  Face-to-face evaluation   History: He has malaise, weakness, and decreased appetite. Gradual in onset over months, and recurrent. Today she was unable to get out of a chair at home. She has had only 750 cal today, in the form of ensure.  Physical exam: Obese, alert, elderly female, poor historian. No wrist or distress. Oral mucous membranes are somewhat dry.  Medical screening examination/treatment/procedure(s) were conducted as a shared visit with non-physician practitioner(s) and myself.  I personally evaluated the patient during the encounter  Daleen Bo, MD 06/04/14 1734

## 2014-06-04 NOTE — Progress Notes (Signed)
PT is hard stick, 2 IV nurse unable to get an IV site. Pt has IV antibiotic Ordered. N.P. Informed. And ordered for PICC but it will be for tom. ICU RN called and ask to try to get IV site. Will continue to monitor.

## 2014-06-04 NOTE — Progress Notes (Signed)
ICU RN unable to to get IV site, N.P. Informed again, and ordered  IV ABT to P.O. Just for tonight and start IV abt after they put PICC line Tomorrow. Will continue to monitor.

## 2014-06-05 ENCOUNTER — Observation Stay (HOSPITAL_COMMUNITY): Payer: Medicare Other

## 2014-06-05 DIAGNOSIS — R630 Anorexia: Secondary | ICD-10-CM | POA: Diagnosis present

## 2014-06-05 DIAGNOSIS — C7B02 Secondary carcinoid tumors of liver: Secondary | ICD-10-CM | POA: Diagnosis not present

## 2014-06-05 DIAGNOSIS — A419 Sepsis, unspecified organism: Secondary | ICD-10-CM | POA: Diagnosis present

## 2014-06-05 DIAGNOSIS — M8930 Hypertrophy of bone, unspecified site: Secondary | ICD-10-CM

## 2014-06-05 DIAGNOSIS — Z79891 Long term (current) use of opiate analgesic: Secondary | ICD-10-CM | POA: Diagnosis not present

## 2014-06-05 DIAGNOSIS — C786 Secondary malignant neoplasm of retroperitoneum and peritoneum: Secondary | ICD-10-CM | POA: Diagnosis present

## 2014-06-05 DIAGNOSIS — Z79899 Other long term (current) drug therapy: Secondary | ICD-10-CM | POA: Diagnosis not present

## 2014-06-05 DIAGNOSIS — J9601 Acute respiratory failure with hypoxia: Secondary | ICD-10-CM | POA: Diagnosis not present

## 2014-06-05 DIAGNOSIS — E340 Carcinoid syndrome: Secondary | ICD-10-CM | POA: Diagnosis present

## 2014-06-05 DIAGNOSIS — G9340 Encephalopathy, unspecified: Secondary | ICD-10-CM | POA: Diagnosis not present

## 2014-06-05 DIAGNOSIS — E872 Acidosis: Secondary | ICD-10-CM | POA: Diagnosis present

## 2014-06-05 DIAGNOSIS — N179 Acute kidney failure, unspecified: Secondary | ICD-10-CM | POA: Diagnosis not present

## 2014-06-05 DIAGNOSIS — R410 Disorientation, unspecified: Secondary | ICD-10-CM | POA: Diagnosis present

## 2014-06-05 DIAGNOSIS — E669 Obesity, unspecified: Secondary | ICD-10-CM

## 2014-06-05 DIAGNOSIS — M852 Hyperostosis of skull: Secondary | ICD-10-CM | POA: Diagnosis present

## 2014-06-05 DIAGNOSIS — Z794 Long term (current) use of insulin: Secondary | ICD-10-CM | POA: Diagnosis not present

## 2014-06-05 DIAGNOSIS — K810 Acute cholecystitis: Secondary | ICD-10-CM | POA: Diagnosis present

## 2014-06-05 DIAGNOSIS — E1165 Type 2 diabetes mellitus with hyperglycemia: Secondary | ICD-10-CM | POA: Diagnosis present

## 2014-06-05 DIAGNOSIS — R1011 Right upper quadrant pain: Secondary | ICD-10-CM | POA: Diagnosis present

## 2014-06-05 DIAGNOSIS — Z90710 Acquired absence of both cervix and uterus: Secondary | ICD-10-CM | POA: Diagnosis not present

## 2014-06-05 DIAGNOSIS — D369 Benign neoplasm, unspecified site: Secondary | ICD-10-CM | POA: Diagnosis not present

## 2014-06-05 DIAGNOSIS — I10 Essential (primary) hypertension: Secondary | ICD-10-CM | POA: Diagnosis present

## 2014-06-05 DIAGNOSIS — K819 Cholecystitis, unspecified: Secondary | ICD-10-CM | POA: Diagnosis not present

## 2014-06-05 DIAGNOSIS — R197 Diarrhea, unspecified: Secondary | ICD-10-CM | POA: Diagnosis present

## 2014-06-05 DIAGNOSIS — C7A00 Malignant carcinoid tumor of unspecified site: Secondary | ICD-10-CM | POA: Diagnosis not present

## 2014-06-05 DIAGNOSIS — A4189 Other specified sepsis: Secondary | ICD-10-CM | POA: Diagnosis not present

## 2014-06-05 DIAGNOSIS — C7A098 Malignant carcinoid tumors of other sites: Secondary | ICD-10-CM | POA: Diagnosis present

## 2014-06-05 DIAGNOSIS — C787 Secondary malignant neoplasm of liver and intrahepatic bile duct: Secondary | ICD-10-CM | POA: Diagnosis present

## 2014-06-05 DIAGNOSIS — R1013 Epigastric pain: Secondary | ICD-10-CM | POA: Diagnosis present

## 2014-06-05 DIAGNOSIS — K831 Obstruction of bile duct: Secondary | ICD-10-CM | POA: Diagnosis not present

## 2014-06-05 DIAGNOSIS — Z6841 Body Mass Index (BMI) 40.0 and over, adult: Secondary | ICD-10-CM | POA: Diagnosis not present

## 2014-06-05 DIAGNOSIS — I5032 Chronic diastolic (congestive) heart failure: Secondary | ICD-10-CM

## 2014-06-05 DIAGNOSIS — E11649 Type 2 diabetes mellitus with hypoglycemia without coma: Secondary | ICD-10-CM | POA: Diagnosis present

## 2014-06-05 DIAGNOSIS — Z880 Allergy status to penicillin: Secondary | ICD-10-CM | POA: Diagnosis not present

## 2014-06-05 DIAGNOSIS — E780 Pure hypercholesterolemia: Secondary | ICD-10-CM | POA: Diagnosis present

## 2014-06-05 DIAGNOSIS — C7972 Secondary malignant neoplasm of left adrenal gland: Secondary | ICD-10-CM | POA: Diagnosis present

## 2014-06-05 DIAGNOSIS — K5900 Constipation, unspecified: Secondary | ICD-10-CM | POA: Diagnosis present

## 2014-06-05 DIAGNOSIS — R160 Hepatomegaly, not elsewhere classified: Secondary | ICD-10-CM | POA: Diagnosis present

## 2014-06-05 DIAGNOSIS — E871 Hypo-osmolality and hyponatremia: Secondary | ICD-10-CM | POA: Diagnosis present

## 2014-06-05 LAB — COMPREHENSIVE METABOLIC PANEL
ALT: 22 U/L (ref 14–54)
AST: 59 U/L — ABNORMAL HIGH (ref 15–41)
Albumin: 2.6 g/dL — ABNORMAL LOW (ref 3.5–5.0)
Alkaline Phosphatase: 405 U/L — ABNORMAL HIGH (ref 38–126)
Anion gap: 9 (ref 5–15)
BUN: 20 mg/dL (ref 6–20)
CO2: 28 mmol/L (ref 22–32)
Calcium: 8.4 mg/dL — ABNORMAL LOW (ref 8.9–10.3)
Chloride: 98 mmol/L — ABNORMAL LOW (ref 101–111)
Creatinine, Ser: 1.25 mg/dL — ABNORMAL HIGH (ref 0.44–1.00)
GFR calc Af Amer: 51 mL/min — ABNORMAL LOW (ref >60)
GFR calc non Af Amer: 44 mL/min — ABNORMAL LOW (ref >60)
Glucose, Bld: 139 mg/dL — ABNORMAL HIGH (ref 65–99)
Potassium: 5.2 mmol/L — ABNORMAL HIGH (ref 3.5–5.1)
Sodium: 135 mmol/L (ref 135–145)
TOTAL PROTEIN: 7.1 g/dL (ref 6.5–8.1)
Total Bilirubin: 3 mg/dL — ABNORMAL HIGH (ref 0.3–1.2)

## 2014-06-05 LAB — CBC
HEMATOCRIT: 38.2 % (ref 36.0–46.0)
Hemoglobin: 12.3 g/dL (ref 12.0–15.0)
MCH: 29.9 pg (ref 26.0–34.0)
MCHC: 32.2 g/dL (ref 30.0–36.0)
MCV: 92.9 fL (ref 78.0–100.0)
Platelets: 309 10*3/uL (ref 150–400)
RBC: 4.11 MIL/uL (ref 3.87–5.11)
RDW: 13.7 % (ref 11.5–15.5)
WBC: 25.3 10*3/uL — ABNORMAL HIGH (ref 4.0–10.5)

## 2014-06-05 LAB — GLUCOSE, CAPILLARY
Glucose-Capillary: 78 mg/dL (ref 65–99)
Glucose-Capillary: 88 mg/dL (ref 65–99)
Glucose-Capillary: 83 mg/dL (ref 65–99)

## 2014-06-05 MED ORDER — SODIUM CHLORIDE 0.9 % IJ SOLN
10.0000 mL | INTRAMUSCULAR | Status: DC | PRN
  Administered 2014-06-05 – 2014-06-08 (×3): 10 mL
  Administered 2014-06-10: 20 mL
  Administered 2014-06-10: 10 mL
  Filled 2014-06-05 (×5): qty 40

## 2014-06-05 MED ORDER — METRONIDAZOLE IN NACL 5-0.79 MG/ML-% IV SOLN
500.0000 mg | Freq: Three times a day (TID) | INTRAVENOUS | Status: DC
  Administered 2014-06-05 – 2014-06-06 (×2): 500 mg via INTRAVENOUS
  Filled 2014-06-05 (×2): qty 100

## 2014-06-05 MED ORDER — SODIUM CHLORIDE 0.9 % IV BOLUS (SEPSIS): 500.0000 mL | Freq: Once | INTRAVENOUS | Status: DC

## 2014-06-05 MED ORDER — FUROSEMIDE 40 MG PO TABS
40.0000 mg | ORAL_TABLET | Freq: Every day | ORAL | Status: DC
  Administered 2014-06-05: 40 mg via ORAL
  Filled 2014-06-05: qty 1

## 2014-06-05 MED ORDER — CIPROFLOXACIN IN D5W 400 MG/200ML IV SOLN
400.0000 mg | Freq: Two times a day (BID) | INTRAVENOUS | Status: DC
Start: 2014-06-05 — End: 2014-06-06
  Administered 2014-06-05 – 2014-06-06 (×2): 400 mg via INTRAVENOUS
  Filled 2014-06-05 (×2): qty 200

## 2014-06-05 NOTE — Progress Notes (Signed)
Peripherally Inserted Central Catheter/Midline Placement  The IV Nurse has discussed with the patient and/or persons authorized to consent for the patient, the purpose of this procedure and the potential benefits and risks involved with this procedure.  The benefits include less needle sticks, lab draws from the catheter and patient may be discharged home with the catheter.  Risks include, but not limited to, infection, bleeding, blood clot (thrombus formation), and puncture of an artery; nerve damage and irregular heat beat.  Alternatives to this procedure were also discussed.  Patient verbalizes understanding and gave verbal consent.  Per RN pt is intermittently confused- official consent obtained by daughter via telephone.  PICC/Midline Placement Documentation  PICC / Midline Double Lumen 34/19/37 PICC Right Basilic 45 cm 2 cm (Active)  Indication for Insertion or Continuance of Line Poor Vasculature-patient has had multiple peripheral attempts or PIVs lasting less than 24 hours 06/05/2014 10:25 AM  Exposed Catheter (cm) 2 cm 06/05/2014 10:25 AM  Site Assessment Clean;Dry;Intact 06/05/2014 10:25 AM  Lumen #1 Status Flushed;Saline locked;Blood return noted 06/05/2014 10:25 AM  Lumen #2 Status Flushed;Saline locked;Blood return noted 06/05/2014 10:25 AM  Dressing Type Transparent 06/05/2014 10:25 AM  Dressing Status Clean;Dry;Intact;Antimicrobial disc in place 06/05/2014 10:25 AM  Line Care Connections checked and tightened 06/05/2014 10:25 AM  Line Adjustment (NICU/IV Team Only) No 06/05/2014 10:25 AM  Dressing Intervention New dressing 06/05/2014 10:25 AM  Dressing Change Due 06/12/14 06/05/2014 10:25 AM       Rolena Infante 06/05/2014, 10:25 AM

## 2014-06-05 NOTE — Progress Notes (Signed)
Pt refused to finish moviprep, pt drunk half of it. Will continue to monitor.
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
## 2014-06-06 DIAGNOSIS — D72829 Elevated white blood cell count, unspecified: Secondary | ICD-10-CM

## 2014-06-06 DIAGNOSIS — A4189 Other specified sepsis: Secondary | ICD-10-CM

## 2014-06-06 LAB — GLUCOSE, CAPILLARY
GLUCOSE-CAPILLARY: 85 mg/dL (ref 65–99)
GLUCOSE-CAPILLARY: 128 mg/dL — AB (ref 65–99)
GLUCOSE-CAPILLARY: 95 mg/dL (ref 65–99)
GLUCOSE-CAPILLARY: 49 mg/dL — AB (ref 65–99)
GLUCOSE-CAPILLARY: 92 mg/dL (ref 65–99)
Glucose-Capillary: 117 mg/dL — ABNORMAL HIGH (ref 65–99)
Glucose-Capillary: 60 mg/dL — ABNORMAL LOW (ref 65–99)
Glucose-Capillary: 50 mg/dL — ABNORMAL LOW (ref 65–99)
Glucose-Capillary: 63 mg/dL — ABNORMAL LOW (ref 65–99)

## 2014-06-06 LAB — COMPREHENSIVE METABOLIC PANEL
ALK PHOS: 380 U/L — AB (ref 38–126)
ALT: 25 U/L (ref 14–54)
AST: 42 U/L — AB (ref 15–41)
Albumin: 2.8 g/dL — ABNORMAL LOW (ref 3.5–5.0)
Anion gap: 12 (ref 5–15)
BUN: 24 mg/dL — ABNORMAL HIGH (ref 6–20)
CALCIUM: 8.3 mg/dL — AB (ref 8.9–10.3)
CHLORIDE: 98 mmol/L — AB (ref 101–111)
CO2: 26 mmol/L (ref 22–32)
CREATININE: 1.48 mg/dL — AB (ref 0.44–1.00)
GFR calc Af Amer: 42 mL/min — ABNORMAL LOW (ref >60)
GFR, EST NON AFRICAN AMERICAN: 36 mL/min — AB (ref >60)
Glucose, Bld: 75 mg/dL (ref 65–99)
Potassium: 3.7 mmol/L (ref 3.5–5.1)
Sodium: 136 mmol/L (ref 135–145)
TOTAL PROTEIN: 7.9 g/dL (ref 6.5–8.1)
Total Bilirubin: 2.9 mg/dL — ABNORMAL HIGH (ref 0.3–1.2)

## 2014-06-06 LAB — PROTIME-INR
INR: 1.43 (ref 0.00–1.49)
PROTHROMBIN TIME: 17.5 s — AB (ref 11.6–15.2)

## 2014-06-06 LAB — CBC
HEMATOCRIT: 37.9 % (ref 36.0–46.0)
Hemoglobin: 12 g/dL (ref 12.0–15.0)
MCH: 28.6 pg (ref 26.0–34.0)
MCHC: 31.7 g/dL (ref 30.0–36.0)
MCV: 90.5 fL (ref 78.0–100.0)
Platelets: 324 10*3/uL (ref 150–400)
RBC: 4.19 MIL/uL (ref 3.87–5.11)
RDW: 13.6 % (ref 11.5–15.5)
WBC: 28.9 10*3/uL — ABNORMAL HIGH (ref 4.0–10.5)

## 2014-06-06 LAB — PROCALCITONIN: PROCALCITONIN: 5.47 ng/mL

## 2014-06-06 LAB — APTT: APTT: 36 s (ref 24–37)

## 2014-06-06 LAB — LACTIC ACID, PLASMA
LACTIC ACID, VENOUS: 0.9 mmol/L (ref 0.5–2.0)
Lactic Acid, Venous: 1.7 mmol/L (ref 0.5–2.0)

## 2014-06-06 MED ORDER — DEXTROSE 5 % IV SOLN
2.0000 g | Freq: Three times a day (TID) | INTRAVENOUS | Status: DC
  Administered 2014-06-06 – 2014-06-11 (×15): 2 g via INTRAVENOUS
  Filled 2014-06-06 (×20): qty 2

## 2014-06-06 MED ORDER — SODIUM CHLORIDE 0.9 % IV SOLN
2500.0000 mg | Freq: Once | INTRAVENOUS | Status: AC
  Administered 2014-06-06: 2500 mg via INTRAVENOUS
  Filled 2014-06-06 (×2): qty 2500

## 2014-06-06 MED ORDER — LEVOFLOXACIN IN D5W 750 MG/150ML IV SOLN
750.0000 mg | INTRAVENOUS | Status: DC
  Administered 2014-06-07 – 2014-06-11 (×5): 750 mg via INTRAVENOUS
  Filled 2014-06-06 (×5): qty 150

## 2014-06-06 MED ORDER — LEVOFLOXACIN IN D5W 750 MG/150ML IV SOLN
750.0000 mg | Freq: Once | INTRAVENOUS | Status: AC
Start: 2014-06-06 — End: 2014-06-06
  Administered 2014-06-06: 750 mg via INTRAVENOUS
  Filled 2014-06-06: qty 150

## 2014-06-06 MED ORDER — VANCOMYCIN HCL IN DEXTROSE 1-5 GM/200ML-% IV SOLN
1000.0000 mg | Freq: Once | INTRAVENOUS | Status: DC
  Filled 2014-06-06: qty 200

## 2014-06-06 MED ORDER — VANCOMYCIN HCL 10 G IV SOLR
1750.0000 mg | INTRAVENOUS | Status: DC
  Administered 2014-06-07 – 2014-06-08 (×2): 1750 mg via INTRAVENOUS
  Filled 2014-06-06 (×3): qty 1750

## 2014-06-06 MED ORDER — INSULIN GLARGINE 100 UNIT/ML ~~LOC~~ SOLN
45.0000 [IU] | Freq: Two times a day (BID) | SUBCUTANEOUS | Status: DC
  Filled 2014-06-06 (×2): qty 0.45

## 2014-06-06 MED ORDER — DEXTROSE 5 % IV SOLN
2.0000 g | Freq: Once | INTRAVENOUS | Status: AC
  Filled 2014-06-06: qty 2

## 2014-06-06 NOTE — Progress Notes (Addendum)
Pt's CBG 50. 4oz OJ given PO. MD paged. Lind Guest, RN  Orders obtained for adjusted Insulin. See MAR. Lind Guest, RN

## 2014-06-06 NOTE — Progress Notes (Signed)
Hypoglycemic Event  CBG: 48  Treatment: 15 GM carbohydrate snack  Symptoms: Hungry  Follow-up CBG: Time: 2157 CBG Result: 92  Possible Reasons for Event: Unknown  Comments/MD notified: per protocol    Yesenia Lyons, Amrit Erck M  Remember to initiate Hypoglycemia Order Set & complete

## 2014-06-06 NOTE — Progress Notes (Signed)
[facility] PROGRESS NOTE  Yesenia Lyons PJA:[id number] DOB: [birth year] DOA: 06/03/2014 PCP: Isaias Cowman, PA-C  Assessment/Plan: 1. Acute encephalopathy 1. Much improved since admission 2. Suspecting possible toxic-metabolic encephalopathy from acute infection 2. Metastatic Carcinoid tumor 1. Normally followed by Dr. Truitt Merle 2. Pt had been on Octreotide as an outpatient 3. Had discussed case with on-call Oncologist 3. Elevated transaminases, elevated bilirubin 1. LFT's overall seem stable, however total bili stable at 2.9 2. Recent CT abd on 5/26 was unremarkable for biliary obstruction 3. Abd [place] with findings of significant sludge with GB wall thickening and trace fluid around gallbladder 4. Discussed case with General Surgery on call, recommending HIDA scan. Had ordered and will await results 4. Pseudohyponatremia with hyperglycemia 1. Improved with improving serum glucose 2. Continue to titrate insulin as needed (see below) 5. DM2 1. Currently on lantus 55 units BID with 10 units pre-meal insulin 2. Continue SSI coverage 3. Noted to have bouts of hypoglycemia. Will decrease lantus to 45 units and stop pre-meal coverage for now 6. HTN 1. BP remains stable 7. Chronic diastolic CHF 1. Compensated on exam 2. Cont to monitor 3. Holding diuretics secondary to mildly elevated Cr 8. Sepsis from unclear source, possible cholecystitis 1. Pt with fevers on presentation with WBC of 22k and elevated 2. Per above, total bili is newly elevated this admission with RUQ pain 3. Empiric cipro and flagyl were started to cover possible cholecystits in the setting of PCN allergy 4. Patient remains febrile with rising leukocytosis 5. Lactate normal 6. Have transitioned to aztreonam, levaquin, and vanc for now 9. DVT prophylaxis 1. Heparin subQ 10. Constipation 1. Recent CT abd from 5/26 personally reviewed with large stool noted in the ascending through transverse colons 2. Improvement  noted following cathartics  Code Status: Full Family Communication: Pt in room Disposition Plan: Pending   Consultants:    Procedures:    Antibiotics:  Ciprofloxacin 5/28>>>5/30  Flagyl 5/28>>>5/30  Aztreonam 5/30>>>  Levaquin 5/30>>>  Vancomycin 5/30>>>  HPI/Subjective: Patient claims to be feeling better today  Objective: Filed Vitals:   06/05/14 2047 06/05/14 2202 06/06/14 0452 06/06/14 1500  BP: 110/61  121/71 139/80  Pulse: 82  71 75  Temp: 102.6 F (39.2 C) 99.7 F (37.6 C) 98.4 F (36.9 C) 101.2 F (38.4 C)  TempSrc: Oral Oral Oral Oral  Resp: 24  20 20   Height:      Weight:   147.1 kg (324 lb 4.8 oz)   SpO2: 99%  100% 98%    Intake/Output Summary (Last 24 hours) at 06/06/14 1703 Last data filed at 06/06/14 1300  Gross per 24 hour  Intake  603.4 ml  Output    350 ml  Net  253.4 ml   Filed Weights   06/04/14 0247 06/05/14 0500 06/06/14 0452  Weight: 147 kg (324 lb 1.2 oz) 147.2 kg (324 lb 8.3 oz) 147.1 kg (324 lb 4.8 oz)    Exam:   General:  Awake,laying in bed in nad  Cardiovascular: regular, s1, s2  Respiratory: normal resp effort, no wheeing  Abdomen: morbidly obese, decreased BS, tender over epigastric region  Musculoskeletal: Perfused, no clubbing, no cyanosis  Data Reviewed: Basic Metabolic Panel:  Recent Labs Lab 06/04/14 1617 06/04/14 1906 06/04/14 2235 06/05/14 0600 06/06/14 0330  NA 135 136 136 135 136  K 4.5 4.4 4.3 5.2* 3.7  CL 99* 99* 99* 98* 98*  CO2 26 25 27 28 26   GLUCOSE 230* 154* 121* 139* 75  BUN 16 16 18 20  24*  CREATININE 1.20* 0.92 0.96 1.25* 1.48*  CALCIUM 8.6* 8.7* 8.6* 8.4* 8.3*   Liver Function Tests:  Recent Labs Lab 06/03/14 2240 06/04/14 0232 06-13-2014 0600 06/06/14 0330  AST 55* 50* 59* 42*  ALT 44 43 22 25  ALKPHOS 530* 516* 405* 380*  BILITOT 2.1* 2.2* 3.0* 2.9*  PROT 8.1 8.0 7.1 7.9  ALBUMIN 3.0* 3.1* 2.6* 2.8*    Recent Labs Lab 06/04/14 0232  LIPASE 11*    Recent  Labs Lab 06/04/14 0230  AMMONIA 29   CBC:  Recent Labs Lab 06/03/14 2240 06/04/14 0232 06-13-14 0600 06/06/14 0330  WBC 20.0* 22.1* 25.3* 28.9*  NEUTROABS 15.4* 16.3*  --   --   HGB 13.2 12.1 12.3 12.0  HCT 41.1 37.9 38.2 37.9  MCV 91.5 91.8 92.9 90.5  PLT 295 289 309 324   Cardiac Enzymes:  Recent Labs Lab 06/03/14 2240  TROPONINI <0.03   BNP (last 3 results) No results for input(s): BNP in the last 8760 hours.  ProBNP (last 3 results) No results for input(s): PROBNP in the last 8760 hours.  CBG:  Recent Labs Lab 06/06/14 0748 06/06/14 0815 06/06/14 1209 06/06/14 1409 06/06/14 1635  GLUCAP 60* 117* 85 128* 50*    Recent Results (from the past 240 hour(s))  Culture, blood (routine x 2)     Status: None (Preliminary result)   Collection Time: 06/04/14  8:29 AM  Result Value Ref Range Status   Specimen Description BLOOD RIGHT ARM  Final   Special Requests   Final    BOTTLES DRAWN AEROBIC AND ANAEROBIC 10CC BOTH BOTTLES   Culture   Final           BLOOD CULTURE RECEIVED NO GROWTH TO DATE CULTURE WILL BE HELD FOR 5 DAYS BEFORE ISSUING A FINAL NEGATIVE REPORT Performed at [facility]    Report Status PENDING  Incomplete  Culture, blood (routine x 2)     Status: None (Preliminary result)   Collection Time: 06/04/14  8:40 AM  Result Value Ref Range Status   Specimen Description BLOOD RIGHT ARM  Final   Special Requests BOTTLES DRAWN AEROBIC ONLY 5CC BLUE BOTTLE  Final   Culture   Final           BLOOD CULTURE RECEIVED NO GROWTH TO DATE CULTURE WILL BE HELD FOR 5 DAYS BEFORE ISSUING A FINAL NEGATIVE REPORT Performed at [facility]    Report Status PENDING  Incomplete     Studies: US Abdomen Limited Ruq  Jun 13, 2014   CLINICAL DATA:  Cholecystitis, right upper quadrant pain  EXAM: US ABDOMEN LIMITED - RIGHT UPPER QUADRANT  COMPARISON:  06/01/2014  FINDINGS: Gallbladder:  There is abundant sludge within the gallbladder. The wall the  gallbladder is thickened at 5 mm. There is trace fluid around the gallbladder. There is no Murphy's sign. There are no stones.  Common bile duct:  Diameter: 4 mm  Liver:  There is heterogeneous hepatic echotexture. Multiple areas of masslike appearance are suggested the largest in the left lobe measuring 4 x 3.5 x 3.5 cm. The liver is enlarged at 23 cm span.  Patient is tender over the right upper quadrant.  IMPRESSION: 1. Hepatomegaly with heterogeneous hepatic echotexture and multiple areas of masslike attenuation. This is consistent with the periods of multiple metastatic lesions on recent CT scan. 2. Although there is no sonographic Murphy sign, there is abundant sludge in the gallbladder, gallbladder wall  thickening, and trace fluid around the gallbladder.   Electronically Signed   By: Skipper Cliche M.D.   On: 06/05/2014 12:51    Scheduled Meds: . amLODipine  5 mg Oral Daily  . atenolol  25 mg Oral Daily  . aztreonam  2 g Intravenous Q8H  . ferrous gluconate  324 mg Oral BID WC  . gabapentin  400 mg Oral TID  . heparin  5,000 Units Subcutaneous 3 times per day  . insulin aspart  0-20 Units Subcutaneous TID WC  . insulin aspart  0-5 Units Subcutaneous QHS  . insulin glargine  45 Units Subcutaneous BID  . [START ON 06/07/2014] levofloxacin (LEVAQUIN) IV  750 mg Intravenous Q24H  . sodium chloride  3 mL Intravenous Q12H  . [START ON 06/07/2014] vancomycin  1,750 mg Intravenous Q24H  . vancomycin  2,500 mg Intravenous Once   Continuous Infusions: . 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 If 7PM-7AM, please contact night-coverage at [email or web address], password [facility] 06/06/2014, 5:03 PM  LOS: 1  day

## 2014-06-06 NOTE — Progress Notes (Signed)
CBG 60. Gave pt 4oz orange juice PO. CBG 15 min after OJ 117. Lind Guest, RN

## 2014-06-06 NOTE — Progress Notes (Addendum)
ANTIBIOTIC CONSULT NOTE - INITIAL  Pharmacy Consult for Levaquin, Aztreonam, Vancomycin Indication: sepsis  Allergies  Allergen Reactions  . Penicillins Itching and Swelling    Patient Measurements: Height: 5\' 10"  (177.8 cm) Weight: (!) 324 lb 4.8 oz (147.1 kg) IBW/kg (Calculated) : 68.5  Vital Signs: Temp: 98.4 F (36.9 C) (05/30 0452) Temp Source: Oral (05/30 0452) BP: 121/71 mmHg (05/30 0452) Pulse Rate: 71 (05/30 0452) Intake/Output from previous day: 05/29 0701 - 05/30 0700 In: 553.4 [I.V.:103; IV Piggyback:450.4] Out: -  Intake/Output from this shift:    Labs:  Recent Labs  06/04/14 0232  06/04/14 2235 06/05/14 0600 06/06/14 0330  WBC 22.1*  --   --  25.3* 28.9*  HGB 12.1  --   --  12.3 12.0  PLT 289  --   --  309 324  CREATININE 1.20*  < > 0.96 1.25* 1.48*  < > = values in this interval not displayed. Estimated Creatinine Clearance: 59.8 mL/min (by C-G formula based on Cr of 1.48). No results for input(s): VANCOTROUGH, VANCOPEAK, VANCORANDOM, GENTTROUGH, GENTPEAK, GENTRANDOM, TOBRATROUGH, TOBRAPEAK, TOBRARND, AMIKACINPEAK, AMIKACINTROU, AMIKACIN in the last 72 hours.   Microbiology: Recent Results (from the past 720 hour(s))  Culture, blood (routine x 2)     Status: None (Preliminary result)   Collection Time: 06/04/14  8:29 AM  Result Value Ref Range Status   Specimen Description BLOOD RIGHT ARM  Final   Special Requests   Final    BOTTLES DRAWN AEROBIC AND ANAEROBIC 10CC BOTH BOTTLES   Culture   Final           BLOOD CULTURE RECEIVED NO GROWTH TO DATE CULTURE WILL BE HELD FOR 5 DAYS BEFORE ISSUING A FINAL NEGATIVE REPORT Performed at [facility]    Report Status PENDING  Incomplete  Culture, blood (routine x 2)     Status: None (Preliminary result)   Collection Time: 06/04/14  8:40 AM  Result Value Ref Range Status   Specimen Description BLOOD RIGHT ARM  Final   Special Requests BOTTLES DRAWN AEROBIC ONLY 5CC BLUE BOTTLE  Final   Culture    Final           BLOOD CULTURE RECEIVED NO GROWTH TO DATE CULTURE WILL BE HELD FOR 5 DAYS BEFORE ISSUING A FINAL NEGATIVE REPORT Performed at [facility]    Report Status PENDING  Incomplete    Medical History: Past Medical History  Diagnosis Date  . Pneumonia   . Renal disorder     renal failure  . Obesity   . Hypertension   . Diabetes mellitus   . High cholesterol   . CHF (congestive heart failure)     Medications:  Anti-infectives    Start     Dose/Rate Route Frequency Ordered Stop   06/06/14 0815  levofloxacin (LEVAQUIN) IVPB 750 mg     750 mg 100 mL/hr over 90 Minutes Intravenous  Once 06/06/14 0802     06/06/14 0815  aztreonam (AZACTAM) 2 g in dextrose 5 % 50 mL IVPB     2 g 100 mL/hr over 30 Minutes Intravenous  Once 06/06/14 0802     06/06/14 0815  vancomycin (VANCOCIN) IVPB 1000 mg/200 mL premix     1,000 mg 200 mL/hr over 60 Minutes Intravenous  Once 06/06/14 0802     06/05/14 2200  metroNIDAZOLE (FLAGYL) IVPB 500 mg  Status:  Discontinued     500 mg 100 mL/hr over 60 Minutes Intravenous 3 times per  day 06/05/14 1400 06/06/14 0802   06/05/14 2000  ciprofloxacin (CIPRO) IVPB 400 mg  Status:  Discontinued     400 mg 200 mL/hr over 60 Minutes Intravenous Every 12 hours 06/05/14 1410 06/06/14 0802   06/04/14 2200  metroNIDAZOLE (FLAGYL) tablet 500 mg  Status:  Discontinued     500 mg Oral 3 times per day 06/04/14 2124 06/05/14 1400   06/04/14 2130  ciprofloxacin (CIPRO) tablet 500 mg  Status:  Discontinued     500 mg Oral 2 times daily 06/04/14 2124 06/05/14 1400   06/04/14 1300  metroNIDAZOLE (FLAGYL) IVPB 500 mg  Status:  Discontinued     500 mg 100 mL/hr over 60 Minutes Intravenous 3 times per day 06/04/14 1218 06/04/14 2124   06/04/14 1300  ciprofloxacin (CIPRO) IVPB 400 mg  Status:  Discontinued     400 mg 200 mL/hr over 60 Minutes Intravenous Every 12 hours 06/04/14 1233 06/04/14 2124     Assessment: 64 y.o. obese female admitted 06/03/2014 for  AMS with fever, leukocytosis, and lactic acidosis.  Found also to have elevated transaminases and bilirubin.  Thought initially due to known metastatic carcinoid tumor, but opted to treated with Cipro/Flagyl for possible cholecystitis.  Now with new fevers to 102.6 overnight and worsening leukocytosis, to broaden to Levaquin, Aztreonam, Vancomycin (noted PCN allergy with swelling)  5/28 >> Ciprofloxacin >> 5/30 5/28 >> Metronidazole >> 5/30 5/30 >>Levofloxacin >> 5/30 >> Aztreonam >> 5/30 >> Vancomycin >>  Tmax: 102.6; now afebrile WBCs: elevated, trending up Renal: new AKI (sepsis-related?); CrCl 60 CG; 43 N  5/28 blood: NGTD   Goal of Therapy:  Vancomycin trough level 15-20 mcg/ml  Eradication of infection Appropriate antibiotic dosing for indication and renal function  Plan:  Day 3 antibiotics Levofloxacin 750 mg IV q24 hr Aztreonam 2 g IV q8 hr Vancomycin 2500 mg IV now, then 1750 mg IV q24 hr Measure vancomycin trough levels at steady state as indicated  Follow clinical course, renal function, culture results as available  Follow for de-escalation of antibiotics and LOT   Reuel Boom, PharmD Pager: [phone] 06/06/2014, 8:54 AM

## 2014-06-06 NOTE — Progress Notes (Addendum)
Repeat CBG 63  Pt eating dinner. Had pt drink 4oz apple juice. Will recheck CBG in 15 min.  Lind Guest, RN  Repeat CBG 95 Lind Guest, RN

## 2014-06-07 ENCOUNTER — Inpatient Hospital Stay (HOSPITAL_COMMUNITY): Payer: Medicare Other

## 2014-06-07 ENCOUNTER — Encounter (HOSPITAL_COMMUNITY): Payer: Self-pay | Admitting: General Surgery

## 2014-06-07 DIAGNOSIS — K810 Acute cholecystitis: Secondary | ICD-10-CM

## 2014-06-07 DIAGNOSIS — C7A00 Malignant carcinoid tumor of unspecified site: Secondary | ICD-10-CM

## 2014-06-07 DIAGNOSIS — D72829 Elevated white blood cell count, unspecified: Secondary | ICD-10-CM

## 2014-06-07 DIAGNOSIS — A4189 Other specified sepsis: Secondary | ICD-10-CM

## 2014-06-07 DIAGNOSIS — C7B02 Secondary carcinoid tumors of liver: Secondary | ICD-10-CM

## 2014-06-07 LAB — COMPREHENSIVE METABOLIC PANEL
ALBUMIN: 2.2 g/dL — AB (ref 3.5–5.0)
ALT: 17 U/L (ref 14–54)
ANION GAP: 7 (ref 5–15)
AST: 29 U/L (ref 15–41)
Alkaline Phosphatase: 298 U/L — ABNORMAL HIGH (ref 38–126)
BILIRUBIN TOTAL: 2 mg/dL — AB (ref 0.3–1.2)
BUN: 20 mg/dL (ref 6–20)
CALCIUM: 7.6 mg/dL — AB (ref 8.9–10.3)
CHLORIDE: 100 mmol/L — AB (ref 101–111)
CO2: 25 mmol/L (ref 22–32)
CREATININE: 1.19 mg/dL — AB (ref 0.44–1.00)
GFR calc Af Amer: 54 mL/min — ABNORMAL LOW (ref >60)
GFR calc non Af Amer: 47 mL/min — ABNORMAL LOW (ref >60)
Glucose, Bld: 58 mg/dL — ABNORMAL LOW (ref 65–99)
Potassium: 3.2 mmol/L — ABNORMAL LOW (ref 3.5–5.1)
Sodium: 132 mmol/L — ABNORMAL LOW (ref 135–145)
Total Protein: 6.9 g/dL (ref 6.5–8.1)

## 2014-06-07 LAB — GLUCOSE, CAPILLARY
GLUCOSE-CAPILLARY: 146 mg/dL — AB (ref 65–99)
Glucose-Capillary: 49 mg/dL — ABNORMAL LOW (ref 65–99)
Glucose-Capillary: 84 mg/dL (ref 65–99)
Glucose-Capillary: 53 mg/dL — ABNORMAL LOW (ref 65–99)
Glucose-Capillary: 62 mg/dL — ABNORMAL LOW (ref 65–99)
Glucose-Capillary: 90 mg/dL (ref 65–99)
Glucose-Capillary: 167 mg/dL — ABNORMAL HIGH (ref 65–99)

## 2014-06-07 LAB — CBC WITH DIFFERENTIAL/PLATELET
Basophils Absolute: 0 10*3/uL (ref 0.0–0.1)
Basophils Relative: 0 % (ref 0–1)
EOS PCT: 0 % (ref 0–5)
Eosinophils Absolute: 0 10*3/uL (ref 0.0–0.7)
HEMATOCRIT: 33.2 % — AB (ref 36.0–46.0)
Hemoglobin: 10.5 g/dL — ABNORMAL LOW (ref 12.0–15.0)
LYMPHS ABS: 1.7 10*3/uL (ref 0.7–4.0)
Lymphocytes Relative: 8 % — ABNORMAL LOW (ref 12–46)
MCH: 28.7 pg (ref 26.0–34.0)
MCHC: 31.6 g/dL (ref 30.0–36.0)
MCV: 90.7 fL (ref 78.0–100.0)
MONO ABS: 2.9 10*3/uL — AB (ref 0.1–1.0)
Monocytes Relative: 14 % — ABNORMAL HIGH (ref 3–12)
NEUTROS ABS: 16.3 10*3/uL — AB (ref 1.7–7.7)
Neutrophils Relative %: 78 % — ABNORMAL HIGH (ref 43–77)
PLATELETS: 280 10*3/uL (ref 150–400)
RBC: 3.66 MIL/uL — ABNORMAL LOW (ref 3.87–5.11)
RDW: 13.7 % (ref 11.5–15.5)
WBC: 20.9 10*3/uL — ABNORMAL HIGH (ref 4.0–10.5)

## 2014-06-07 LAB — HEMOGLOBIN A1C
HEMOGLOBIN A1C: 8.9 % — AB (ref 4.8–5.6)
Mean Plasma Glucose: 209 mg/dL

## 2014-06-07 MED ORDER — POTASSIUM CHLORIDE CRYS ER 20 MEQ PO TBCR
40.0000 meq | EXTENDED_RELEASE_TABLET | Freq: Once | ORAL | Status: AC
  Administered 2014-06-07: 40 meq via ORAL
  Filled 2014-06-07: qty 2

## 2014-06-07 MED ORDER — TECHNETIUM TC 99M MEBROFENIN IV KIT
9.6000 | PACK | Freq: Once | INTRAVENOUS | Status: AC | PRN
  Administered 2014-06-07: 10 via INTRAVENOUS

## 2014-06-07 MED ORDER — HEPARIN SODIUM (PORCINE) 5000 UNIT/ML IJ SOLN
5000.0000 [IU] | Freq: Three times a day (TID) | INTRAMUSCULAR | Status: DC
  Administered 2014-06-08 – 2014-06-11 (×7): 5000 [IU] via SUBCUTANEOUS
  Filled 2014-06-07 (×8): qty 1

## 2014-06-07 MED ORDER — HEPARIN SOD (PORK) LOCK FLUSH 100 UNIT/ML IV SOLN
500.0000 [IU] | Freq: Once | INTRAVENOUS | Status: DC
  Filled 2014-06-07: qty 5

## 2014-06-07 MED ORDER — DEXTROSE 50 % IV SOLN
INTRAVENOUS | Status: AC
Start: 2014-06-07 — End: 2014-06-07
  Administered 2014-06-07: 25 mL
  Filled 2014-06-07: qty 50

## 2014-06-07 MED ORDER — MORPHINE SULFATE 4 MG/ML IJ SOLN
5.9000 mg | Freq: Once | INTRAMUSCULAR | Status: AC
  Administered 2014-06-07: 5.9 mg via INTRAVENOUS

## 2014-06-07 MED ORDER — DEXTROSE 5 % IV SOLN: INTRAVENOUS | Status: DC

## 2014-06-07 MED ORDER — MORPHINE SULFATE 4 MG/ML IJ SOLN
INTRAMUSCULAR | Status: AC
  Filled 2014-06-07: qty 2

## 2014-06-07 NOTE — Progress Notes (Signed)
Yesenia Lyons   DOB:02/07/1950   NG#:[id number]   GMW#:[id number]  Subjective: Pt was admitted on 06/04/2014 for sepsis and acute encephalopathy. She is now brought antibiotics, still febrile, but does feel better overall today.   Objective:  Filed Vitals:   06/07/14 2107  BP: 129/82  Pulse: 90  Temp: 99 F (37.2 C)  Resp: 20    Body mass index is 46.37 kg/(m^2).  Intake/Output Summary (Last 24 hours) at 06/07/14 2137 Last data filed at 06/07/14 1800  Gross per 24 hour  Intake 1479.83 ml  Output      0 ml  Net 1479.83 ml     Sclerae unicteric  Oropharynx clear  No peripheral adenopathy  Lungs clear -- no rales or rhonchi  Heart regular rate and rhythm  Abdomen soft, (+) diffuse tenderness   MSK no focal spinal tenderness, no peripheral edema  Neuro nonfocal    CBG (last 3)   Recent Labs  06/07/14 1425 06/07/14 1451 06/07/14 1628  GLUCAP 62* 90 167*     Labs:  Lab Results  Component Value Date   WBC 20.9* 06/07/2014   HGB 10.5* 06/07/2014   HCT 33.2* 06/07/2014   MCV 90.7 06/07/2014   PLT 280 06/07/2014   NEUTROABS 16.3* 06/07/2014    @LASTCHEMISTRY @  Urine Studies No results for input(s): UHGB, CRYS in the last 72 hours.  Invalid input(s): UACOL, UAPR, USPG, UPH, UTP, UGL, UKET, UBIL, UNIT, UROB, [place], UEPI, UWBC, [place], [place], [place], [place], [place]  Basic Metabolic Panel:  Recent Labs Lab 06/04/14 1906 06/04/14 2235 06/05/14 0600 06/06/14 0330 06/07/14 0515  NA 136 136 135 136 132*  K 4.4 4.3 5.2* 3.7 3.2*  CL 99* 99* 98* 98* 100*  CO2 25 27 28 26 25   GLUCOSE 154* 121* 139* 75 58*  BUN 16 18 20  24* 20  CREATININE 0.92 0.96 1.25* 1.48* 1.19*  CALCIUM 8.7* 8.6* 8.4* 8.3* 7.6*   GFR Estimated Creatinine Clearance: 74.2 mL/min (by C-G formula based on Cr of 1.19). Liver Function Tests:  Recent Labs Lab 06/03/14 2240 06/04/14 0232 06/05/14 0600 06/06/14 0330 06/07/14 0515  AST 55* 50* 59* 42* 29  ALT 44 43 22 25 17   ALKPHOS 530* 516* 405*  380* 298*  BILITOT 2.1* 2.2* 3.0* 2.9* 2.0*  PROT 8.1 8.0 7.1 7.9 6.9  ALBUMIN 3.0* 3.1* 2.6* 2.8* 2.2*    Recent Labs Lab 06/04/14 0232  LIPASE 11*    Recent Labs Lab 06/04/14 0230  AMMONIA 29   Coagulation profile  Recent Labs Lab 06/04/14 0232 06/06/14 0841  INR 1.38 1.43    CBC:  Recent Labs Lab 06/03/14 2240 06/04/14 0232 06/05/14 0600 06/06/14 0330 06/07/14 0515  WBC 20.0* 22.1* 25.3* 28.9* 20.9*  NEUTROABS 15.4* 16.3*  --   --  16.3*  HGB 13.2 12.1 12.3 12.0 10.5*  HCT 41.1 37.9 38.2 37.9 33.2*  MCV 91.5 91.8 92.9 90.5 90.7  PLT 295 289 309 324 280   Cardiac Enzymes:  Recent Labs Lab 06/03/14 2240  TROPONINI <0.03   BNP: Invalid input(s): POCBNP CBG:  Recent Labs Lab 06/07/14 0816 06/07/14 1402 06/07/14 1425 06/07/14 1451 06/07/14 1628  GLUCAP 84 53* 62* 90 167*   D-Dimer No results for input(s): DDIMER in the last 72 hours. Hgb A1c No results for input(s): HGBA1C in the last 72 hours. Lipid Profile No results for input(s): CHOL, HDL, LDLCALC, TRIG, CHOLHDL, LDLDIRECT in the last 72 hours. Thyroid function studies No results for input(s): TSH, T4TOTAL,  T3FREE, THYROIDAB in the last 72 hours.  Invalid input(s): FREET3 Anemia work up No results for input(s): VITAMINB12, FOLATE, FERRITIN, TIBC, IRON, RETICCTPCT in the last 72 hours. Microbiology Recent Results (from the past 240 hour(s))  Culture, blood (routine x 2)     Status: None (Preliminary result)   Collection Time: 06/04/14  8:29 AM  Result Value Ref Range Status   Specimen Description BLOOD RIGHT ARM  Final   Special Requests   Final    BOTTLES DRAWN AEROBIC AND ANAEROBIC 10CC BOTH BOTTLES   Culture   Final           BLOOD CULTURE RECEIVED NO GROWTH TO DATE CULTURE WILL BE HELD FOR 5 DAYS BEFORE ISSUING A FINAL NEGATIVE REPORT Performed at [facility]    Report Status PENDING  Incomplete  Culture, blood (routine x 2)     Status: None (Preliminary result)    Collection Time: 06/04/14  8:40 AM  Result Value Ref Range Status   Specimen Description BLOOD RIGHT ARM  Final   Special Requests BOTTLES DRAWN AEROBIC ONLY 5CC BLUE BOTTLE  Final   Culture   Final           BLOOD CULTURE RECEIVED NO GROWTH TO DATE CULTURE WILL BE HELD FOR 5 DAYS BEFORE ISSUING A FINAL NEGATIVE REPORT Performed at [facility]    Report Status PENDING  Incomplete      Studies:  Nm Hepatobiliary Liver Func  06/07/2014   CLINICAL DATA:  Right upper quadrant pain. Cholecystitis. Gallbladder sludge and wall thickening seen on recent ultrasound. Multiple liver masses. Carcinoid tumor.  EXAM: NUCLEAR MEDICINE HEPATOBILIARY IMAGING  TECHNIQUE: Sequential images of the abdomen were obtained out to 60 minutes following intravenous administration of radiopharmaceutical. Because of lack of gallbladder activity, imaging was continued for an additional 60 minutes following administration of 5.9 mg morphine IV and additional 2.0 mCi technetium 99 M Choletec.  RADIOPHARMACEUTICALS:  9.6 Tc-44m Choletec IV  COMPARISON:  Abdomen ultrasound on 06/05/2014 and abdomen CT on 06/01/2014  FINDINGS: Prompt radiopharmaceutical uptake by the liver is seen. The liver shows multiple photopenic masslike areas, consistent with numerous liver masses as seen on recent CT, suspicious for liver metastases.  Biliary excretion of activity is delayed indicating hepatocellular dysfunction. Biliary activity reaches small bowel initially on the 35 minutes image.  No gallbladder activity is visualized prior to morphine administration, as well as during additional 60 minutes of imaging following administration of intravenous morphine.  IMPRESSION: Absence of gallbladder activity, suggesting acute cholecystitis. Note that false positive results can occur in setting of setting of prolonged NPO status or gallbladder sludge.  No evidence of biliary obstruction.  Diffuse photopenic lesions throughout the liver, consistent  liver metastases.   Electronically Signed   By: Earle Gell M.D.   On: 06/07/2014 14:09    Assessment: 64 y.o.  1. 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 Acute encephalopathy, improved 4. Hypertension  5. Diabetes mellitus 6. AKI, resolving 7. Leukocytosis secondary to sepsis 8. Fever secondary to sepsis  9. Chronic diastolic CHF   Plan: -She was evaluated by surgery this afternoon, surgery was not offered due to her comorbidities. She is scheduled to have a percutaneous chole drainage, I agree with the plan  -continue antibiotics, pending culture from chole drainage tomorrow -she has slight disease progression on recent restaging CT abdomen/pelvis, will discuss her case in GI tumor conference tomorrow  -I will follow her after her  hospital discharge    Truitt Merle, MD 06/07/2014  9:37 PM

## 2014-06-07 NOTE — Care Management Note (Signed)
Case Management Note  Patient Details  Name: Yesenia Lyons MRN: [id number] Date of Birth: September 30, 1950  Subjective/Objective:                   1. Acute encephalopathy Action/Plan: Discharge planning  Expected Discharge Date:  unknown               Expected Discharge Plan:  Home/Self Care  In-House Referral:     Discharge planning Services  CM Consult  Post Acute Care Choice:    Choice offered to:     DME Arranged:    DME Agency:     HH Arranged:    HH Agency:     Status of Service:  In process, will continue to follow  Medicare Important Message Given:    Date Medicare IM Given:    Medicare IM give by:    Date Additional Medicare IM Given:    Additional Medicare Important Message give by:     If discussed at [place] of Stay Meetings, dates discussed:    Additional Comments: CM met with pt in room to discuss discharge needs.  Pt is weak but states she can get up and get to and from bathroom independently.  Pt lives with daughter [name].  Pt states she has a rolling walker at home and at this time cannot think of any other DME needs.  CM will follow for disposition needs and at this time, pt plans to go home with daughter, [name] upon discharge. Dellie Catholic, RN 06/07/2014, 1:51 PM

## 2014-06-07 NOTE — Progress Notes (Addendum)
[facility] PROGRESS NOTE  Yesenia Lyons OEV:[id number] DOB: Dec 18, 1950 DOA: 06/03/2014 PCP: Isaias Cowman, PA-C   Off Service Summary: [phone] with metastatic carcinoid tumor followed by Dr. Burr Medico who presented initially with altered mental status, found to be floridly septic with LFT's more elevated than baseline. Abd [place] was suspicious for acute cholecystitis and the patient was started on cipro/flagyl given PCN allergy. Surgery was called, who recommended HIDA scan. The patient's WBC continued to worsen and abx were broadened to vanc, levaquin, and aztreonam. WBC has since improved. Follow up HIDA was ultimately obtained which also suggested colicystitis and Surgery was formally consulted with recommendations for IR guided perc drain.   Assessment/Plan: 1. Acute encephalopathy 1. Improved since admission 2. Suspect possible toxic-metabolic encephalopathy from acute infection 2. Metastatic Carcinoid tumor 1. Normally followed by Dr. Truitt Merle 2. Have discussed case with Dr. Burr Medico who will see patient in hospital 3. Pt had been on Octreotide as an outpatient 3. Elevated transaminases, elevated bilirubin 1. LFT's trending down 2. Presenting bili had trended up, peaking at 3.0, now trending down to 2.0 3. Recent CT abd on 5/26 was unremarkable for biliary obstruction 4. Recent abd [place] with findings of significant sludge with GB wall thickening and trace fluid around gallbladder 5. Discussed case with General Surgery on call, recommending HIDA scan. HIDA done, awaiting results  Addendum: HIDA results suggestive of acute cholecystitis. Have discussed case with General Surgery for formal consult. Will follow  4. Pseudohyponatremia with hyperglycemia 1. Sodium improved 5. DM2 1. Had initially been continued on lantus 55 units BID with 10 units pre-meal insulin 2. Continue SSI coverage 3. Pt hypoglycemic overnight. Will stop scheduled insulin and cont on SSI alone 6. HTN 1. BP remains  stable 7. Chronic diastolic CHF 1. Compensated on exam 2. Cont to monitor 3. Holding diuretics secondary to mildly elevated Cr 8. Sepsis from unclear source, possible cholecystitis 1. Pt with fevers on presentation with WBC of 22k, peaking at 28k 2. Per above, total bili is newly elevated this admission with RUQ pain 3. Patient was initially started on empiric cipro and flagyl to cover possible cholecystits in the setting of PCN allergy 4. Pt's sepsis worsened and abx were broadened to  aztreonam, levaquin, and vanc with improved WBC overnight 5. Await final results of pan-cultures 9. DVT prophylaxis 1. Heparin subQ 10. Constipation 1. Recent CT abd from 5/26 personally reviewed with large stool noted in the ascending through transverse colons 2. Improvement noted following cathartics  Code Status: Full Family Communication: Pt in room Disposition Plan: Pending   Consultants:    Procedures:    Antibiotics:  Ciprofloxacin 5/28>>>5/30  Flagyl 5/28>>>5/30  Aztreonam 5/30>>>  Levaquin 5/30>>>  Vancomycin 5/30>>>  HPI/Subjective: Patient states she feels better today  Objective: Filed Vitals:   06/06/14 1742 06/06/14 2040 06/07/14 0427 06/07/14 0521  BP:  119/68  123/65  Pulse:  79  78  Temp: 98.6 F (37 C) 99.1 F (37.3 C)  100.6 F (38.1 C)  TempSrc: Oral Oral  Oral  Resp:  20  22  Height:      Weight:   146.6 kg (323 lb 3.1 oz)   SpO2:  100%  99%    Intake/Output Summary (Last 24 hours) at 06/07/14 1405 Last data filed at 06/07/14 0906  Gross per 24 hour  Intake 349.83 ml  Output    250 ml  Net  99.83 ml   Filed Weights   06/05/14 0500 06/06/14 0452 06/07/14 0427  Weight: 147.2 kg (324 lb 8.3 oz) 147.1 kg (324 lb 4.8 oz) 146.6 kg (323 lb 3.1 oz)    Exam:   General:  Awake,laying in bed in nad, oriented  Cardiovascular: regular, s1, s2  Respiratory: normal resp effort, no wheeing  Abdomen: morbidly obese, decreased BS, tender over  epigastric region  Musculoskeletal: Perfused, no clubbing, no cyanosis  Data Reviewed: Basic Metabolic Panel:  Recent Labs Lab 06/04/14 1906 06/04/14 2235 06/05/14 0600 06/06/14 0330 06/07/14 0515  NA 136 136 135 136 132*  K 4.4 4.3 5.2* 3.7 3.2*  CL 99* 99* 98* 98* 100*  CO2 25 27 28 26 25   GLUCOSE 154* 121* 139* 75 58*  BUN 16 18 20  24* 20  CREATININE 0.92 0.96 1.25* 1.48* 1.19*  CALCIUM 8.7* 8.6* 8.4* 8.3* 7.6*   Liver Function Tests:  Recent Labs Lab 06/03/14 2240 06/04/14 0232 06/05/14 0600 06/06/14 0330 06/07/14 0515  AST 55* 50* 59* 42* 29  ALT 44 43 22 25 17   ALKPHOS 530* 516* 405* 380* 298*  BILITOT 2.1* 2.2* 3.0* 2.9* 2.0*  PROT 8.1 8.0 7.1 7.9 6.9  ALBUMIN 3.0* 3.1* 2.6* 2.8* 2.2*    Recent Labs Lab 06/04/14 0232  LIPASE 11*    Recent Labs Lab 06/04/14 0230  AMMONIA 29   CBC:  Recent Labs Lab 06/03/14 2240 06/04/14 0232 06/05/14 0600 06/06/14 0330 06/07/14 0515  WBC 20.0* 22.1* 25.3* 28.9* 20.9*  NEUTROABS 15.4* 16.3*  --   --  16.3*  HGB 13.2 12.1 12.3 12.0 10.5*  HCT 41.1 37.9 38.2 37.9 33.2*  MCV 91.5 91.8 92.9 90.5 90.7  PLT 295 289 309 324 280   Cardiac Enzymes:  Recent Labs Lab 06/03/14 2240  TROPONINI <0.03   BNP (last 3 results) No results for input(s): BNP in the last 8760 hours.  ProBNP (last 3 results) No results for input(s): PROBNP in the last 8760 hours.  CBG:  Recent Labs Lab 06/06/14 1741 06/06/14 2106 06/06/14 2157 06/07/14 0729 06/07/14 0816  GLUCAP 95 49* 92 49* 84    Recent Results (from the past 240 hour(s))  Culture, blood (routine x 2)     Status: None (Preliminary result)   Collection Time: 06/04/14  8:29 AM  Result Value Ref Range Status   Specimen Description BLOOD RIGHT ARM  Final   Special Requests   Final    BOTTLES DRAWN AEROBIC AND ANAEROBIC 10CC BOTH BOTTLES   Culture   Final           BLOOD CULTURE RECEIVED NO GROWTH TO DATE CULTURE WILL BE HELD FOR 5 DAYS BEFORE ISSUING A  FINAL NEGATIVE REPORT Performed at [facility]    Report Status PENDING  Incomplete  Culture, blood (routine x 2)     Status: None (Preliminary result)   Collection Time: 06/04/14  8:40 AM  Result Value Ref Range Status   Specimen Description BLOOD RIGHT ARM  Final   Special Requests BOTTLES DRAWN AEROBIC ONLY 5CC BLUE BOTTLE  Final   Culture   Final           BLOOD CULTURE RECEIVED NO GROWTH TO DATE CULTURE WILL BE HELD FOR 5 DAYS BEFORE ISSUING A FINAL NEGATIVE REPORT Performed at [facility]    Report Status PENDING  Incomplete     Studies: No results found.  Scheduled Meds: . amLODipine  5 mg Oral Daily  . atenolol  25 mg Oral Daily  . aztreonam  2 g Intravenous Q8H  .  ferrous gluconate  324 mg Oral BID WC  . gabapentin  400 mg Oral TID  . heparin  5,000 Units Subcutaneous 3 times per day  . heparin lock flush  500 Units Intravenous Once  . insulin aspart  0-20 Units Subcutaneous TID WC  . insulin aspart  0-5 Units Subcutaneous QHS  . levofloxacin (LEVAQUIN) IV  750 mg Intravenous Q24H  . sodium chloride  3 mL Intravenous Q12H  . vancomycin  1,750 mg Intravenous Q24H   Continuous Infusions: . 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 If 7PM-7AM, please contact night-coverage at [email or web address], password [facility] 06/07/2014, 2:05 PM  LOS: 2 days

## 2014-06-07 NOTE — Consult Note (Signed)
[facility] Surgery Consult Note  Yesenia Lyons 1950-03-27  [id number].    Requesting MD: Dr. Wyline Copas Chief Complaint/Reason for Consult: RUQ abdominal pain in setting of metastatic carcinoid  HPI:  64 y/o AA female with a history of low grade carcinoid tumor, unknown primary site, with mets to liver, left adrenal and mesenteric mets, stage IV, diagnosed on 08/24/13 through liver biopsy presented to the [facility] with her daughter with complaints of weakness.  2 days of anorexia and mental status changes.  Patient's daughter reports that this is recurrent for her and that ever so often she will have similar symptoms.  She has seen her PCP several times for this.  She denies fever, cough, nausea, vomiting, diarrhea.  Patient reports right upper quadrant/epigastric abdominal pain, this pain is chronic and likely due to liver metastases, but the patient notes worsening pain over the last few days.  No radiating pain.  Precipitating factors include eating/drinking (not necessarily fatty foods).  No alleviating factors.  She's never been told she had gallstones.  The patient was admitted for sepsis of unknown source.  WBC was at its max 28.9, now 20.9.  Alk phos max 516 now 298, Bilirubin max 3.0, no 2.0.  US shows Hepatomegaly with liver mass, gallbladder sludge, wall thickening, trace fluid.  HIDA was obtained which shows absence of gallbladder activity concerning for cholecystitis.  We were asked to evaluate for cholecystectomy vs perc chole drain.  Pain 0/10, but was 9/10, currently only painful when people palpate her epigastrium/RUQ.  Tolerated fruit plate today for lunch after returning from HIDA.     ROS: All systems reviewed and otherwise negative except for as above  History reviewed. No pertinent family history.  Past Medical History  Diagnosis Date  . Pneumonia   . Renal disorder     renal failure  . Obesity   . Hypertension   . Diabetes mellitus   . High cholesterol   . CHF (congestive  heart failure)     Past Surgical History  Procedure Laterality Date  . Abdominal hysterectomy    . Knee reconstruction, medial patellar femoral ligament      Social History:  reports that she has never smoked. She has never used smokeless tobacco. She reports that she does not drink alcohol or use illicit drugs.  Allergies:  Allergies  Allergen Reactions  . Penicillins Itching and Swelling    Medications Prior to Admission  Medication Sig Dispense Refill  . albuterol (PROVENTIL HFA;VENTOLIN HFA) 108 (90 BASE) MCG/ACT inhaler Inhale 2 puffs into the lungs every 6 (six) hours as needed for wheezing (wheezing).     Marland Kitchen amLODipine-benazepril (LOTREL) 5-10 MG per capsule Take 1 capsule by mouth daily.    Marland Kitchen atenolol (TENORMIN) 25 MG tablet Take 25 mg by mouth daily.     . beclomethasone (QVAR) 80 MCG/ACT inhaler Inhale 1 puff into the lungs as needed (sob). breathing    . feeding supplement, ENSURE COMPLETE, (ENSURE COMPLETE) LIQD Take 237 mLs by mouth 2 (two) times daily between meals. (Patient taking differently: Take 237 mLs by mouth daily as needed (nutrition). ) 30 Bottle 0  . furosemide (LASIX) 40 MG tablet Take 1 tablet (40 mg total) by mouth daily. 10 tablet 0  . gabapentin (NEURONTIN) 400 MG capsule Take 400 mg by mouth 3 (three) times daily.    . insulin aspart (NOVOLOG) 100 UNIT/ML injection Inject 40-50 Units into the skin 3 (three) times daily before meals. Inject 50 units at breakfast and  40 units at  lunch and 40 units at dinner. Pt on sliding scale    . insulin glargine (LANTUS) 100 UNIT/ML injection Inject 50 Units into the skin 2 (two) times daily. In the morning and at bedtime.    . ondansetron (ZOFRAN ODT) 4 MG disintegrating tablet Take 1 tablet (4 mg total) by mouth every 8 (eight) hours as needed for nausea or vomiting. 20 tablet 0  . oxyCODONE-acetaminophen (PERCOCET) 10-325 MG per tablet Take 1 tablet by mouth every 8 (eight) hours as needed for pain. 30 tablet 0  .  spironolactone (ALDACTONE) 25 MG tablet Take 25 mg by mouth daily.    . vitamin C (ASCORBIC ACID) 500 MG tablet Take 1 tablet (500 mg total) by mouth daily. 30 tablet 5  . ferrous gluconate (FERGON) 324 MG tablet Take 324 mg by mouth 2 (two) times daily with a meal.    . Ginger, Zingiber officinalis, (GINGER ROOT) 550 MG CAPS Take 1 capsule by mouth daily as needed (nutrition).     . Zinc 50 MG TABS Take 1 tablet (50 mg total) by mouth daily. (Patient not taking: Reported on 06/03/2014) 30 tablet 5    Blood pressure 123/65, pulse 77, temperature 98.9 F (37.2 C), temperature source Oral, resp. rate 20, height 5' 10" (1.778 m), weight 146.6 kg (323 lb 3.1 oz), SpO2 99 %. Physical Exam: General: pleasant, WD/WN AA female who is laying in bed in NAD HEENT: head is normocephalic, atraumatic.  Sclera are noninjected/no gross scleral icterus.  PERRL.  Ears and nose without any masses or lesions.  Mouth is pink and moist Heart: regular, rate, and rhythm.  No obvious murmurs, gallops, or rubs noted.  Palpable pedal pulses bilaterally Lungs: CTAB, no wheezes, rhonchi, or rales noted.  Respiratory effort nonlabored, O2 [place] on. Abd: Obese, soft, tender in the RUQ/epigastrium, ND, +BS, no masses, hernias, or organomegaly, hysterectomy scar noted MS: all 4 extremities are symmetrical with no cyanosis, clubbing Skin: yellow tinged, warm and dry, LE swelling b/l with slight erythematous tinge at lower shin/ankle, tender to palpation b/l Psych: A&Ox3 with an appropriate affect.   Results for orders placed or performed during the hospital encounter of 06/03/14 (from the past 48 hour(s))  Glucose, capillary     Status: None   Collection Time: 06/05/14  4:50 PM  Result Value Ref Range   Glucose-Capillary 88 65 - 99 mg/dL  Glucose, capillary     Status: None   Collection Time: 06/05/14  8:46 PM  Result Value Ref Range   Glucose-Capillary 83 65 - 99 mg/dL  Comprehensive metabolic panel     Status: Abnormal    Collection Time: 06/06/14  3:30 AM  Result Value Ref Range   Sodium 136 135 - 145 mmol/L   Potassium 3.7 3.5 - 5.1 mmol/L    Comment: REPEATED TO VERIFY DELTA CHECK NOTED    Chloride 98 (L) 101 - 111 mmol/L   CO2 26 22 - 32 mmol/L   Glucose, Bld 75 65 - 99 mg/dL   BUN 24 (H) 6 - 20 mg/dL   Creatinine, Ser 1.48 (H) 0.44 - 1.00 mg/dL   Calcium 8.3 (L) 8.9 - 10.3 mg/dL   Total Protein 7.9 6.5 - 8.1 g/dL   Albumin 2.8 (L) 3.5 - 5.0 g/dL   AST 42 (H) 15 - 41 U/L   ALT 25 14 - 54 U/L   Alkaline Phosphatase 380 (H) 38 - 126 U/L   Total Bilirubin 2.9 (H) 0.3 -  1.2 mg/dL   GFR calc non Af Amer 36 (L) >60 mL/min   GFR calc Af Amer 42 (L) >60 mL/min    Comment: (NOTE) The eGFR has been calculated using the CKD EPI equation. This calculation has not been validated in all clinical situations. eGFR's persistently <60 mL/min signify possible Chronic Kidney Disease.    Anion gap 12 5 - 15  CBC     Status: Abnormal   Collection Time: 06/06/14  3:30 AM  Result Value Ref Range   WBC 28.9 (H) 4.0 - 10.5 K/uL   RBC 4.19 3.87 - 5.11 MIL/uL   Hemoglobin 12.0 12.0 - 15.0 g/dL   HCT 37.9 36.0 - 46.0 %   MCV 90.5 78.0 - 100.0 fL   MCH 28.6 26.0 - 34.0 pg   MCHC 31.7 30.0 - 36.0 g/dL   RDW 13.6 11.5 - 15.5 %   Platelets 324 150 - 400 K/uL  Glucose, capillary     Status: Abnormal   Collection Time: 06/06/14  7:48 AM  Result Value Ref Range   Glucose-Capillary 60 (L) 65 - 99 mg/dL  Glucose, capillary     Status: Abnormal   Collection Time: 06/06/14  8:15 AM  Result Value Ref Range   Glucose-Capillary 117 (H) 65 - 99 mg/dL  Lactic acid, plasma     Status: None   Collection Time: 06/06/14  8:41 AM  Result Value Ref Range   Lactic Acid, Venous 1.7 0.5 - 2.0 mmol/L  Procalcitonin     Status: None   Collection Time: 06/06/14  8:41 AM  Result Value Ref Range   Procalcitonin 5.47 ng/mL    Comment:        Interpretation: PCT > 2 ng/mL: Systemic infection (sepsis) is likely, unless other causes  are known. (NOTE)         ICU PCT Algorithm               Non ICU PCT Algorithm    ----------------------------     ------------------------------         PCT < 0.25 ng/mL                 PCT < 0.1 ng/mL     Stopping of antibiotics            Stopping of antibiotics       strongly encouraged.               strongly encouraged.    ----------------------------     ------------------------------       PCT level decrease by               PCT < 0.25 ng/mL       >= 80% from peak PCT       OR PCT 0.25 - 0.5 ng/mL          Stopping of antibiotics                                             encouraged.     Stopping of antibiotics           encouraged.    ----------------------------     ------------------------------       PCT level decrease by              PCT >= 0.25 ng/mL       <  80% from peak PCT        AND PCT >= 0.5 ng/mL            Continuing antibiotics                                               encouraged.       Continuing antibiotics            encouraged.    ----------------------------     ------------------------------     PCT level increase compared          PCT > 0.5 ng/mL         with peak PCT AND          PCT >= 0.5 ng/mL             Escalation of antibiotics                                          strongly encouraged.      Escalation of antibiotics        strongly encouraged.   Protime-INR     Status: Abnormal   Collection Time: 06/06/14  8:41 AM  Result Value Ref Range   Prothrombin Time 17.5 (H) 11.6 - 15.2 seconds   INR 1.43 0.00 - 1.49  APTT     Status: None   Collection Time: 06/06/14  8:41 AM  Result Value Ref Range   aPTT 36 24 - 37 seconds  Lactic acid, plasma     Status: None   Collection Time: 06/06/14 11:00 AM  Result Value Ref Range   Lactic Acid, Venous 0.9 0.5 - 2.0 mmol/L  Glucose, capillary     Status: None   Collection Time: 06/06/14 12:09 PM  Result Value Ref Range   Glucose-Capillary 85 65 - 99 mg/dL  Glucose, capillary     Status:  Abnormal   Collection Time: 06/06/14  2:09 PM  Result Value Ref Range   Glucose-Capillary 128 (H) 65 - 99 mg/dL  Glucose, capillary     Status: Abnormal   Collection Time: 06/06/14  4:35 PM  Result Value Ref Range   Glucose-Capillary 50 (L) 65 - 99 mg/dL  Glucose, capillary     Status: Abnormal   Collection Time: 06/06/14  5:06 PM  Result Value Ref Range   Glucose-Capillary 63 (L) 65 - 99 mg/dL  Glucose, capillary     Status: None   Collection Time: 06/06/14  5:41 PM  Result Value Ref Range   Glucose-Capillary 95 65 - 99 mg/dL  Glucose, capillary     Status: Abnormal   Collection Time: 06/06/14  9:06 PM  Result Value Ref Range   Glucose-Capillary 49 (L) 65 - 99 mg/dL   Comment 1 Notify RN    Comment 2 Document in Chart   Glucose, capillary     Status: None   Collection Time: 06/06/14  9:57 PM  Result Value Ref Range   Glucose-Capillary 92 65 - 99 mg/dL  Comprehensive metabolic panel     Status: Abnormal   Collection Time: 06/07/14  5:15 AM  Result Value Ref Range   Sodium 132 (L) 135 - 145 mmol/L   Potassium 3.2 (L) 3.5 - 5.1 mmol/L   Chloride 100 (  L) 101 - 111 mmol/L   CO2 25 22 - 32 mmol/L   Glucose, Bld 58 (L) 65 - 99 mg/dL   BUN 20 6 - 20 mg/dL   Creatinine, Ser 1.19 (H) 0.44 - 1.00 mg/dL   Calcium 7.6 (L) 8.9 - 10.3 mg/dL   Total Protein 6.9 6.5 - 8.1 g/dL   Albumin 2.2 (L) 3.5 - 5.0 g/dL   AST 29 15 - 41 U/L   ALT 17 14 - 54 U/L   Alkaline Phosphatase 298 (H) 38 - 126 U/L   Total Bilirubin 2.0 (H) 0.3 - 1.2 mg/dL   GFR calc non Af Amer 47 (L) >60 mL/min   GFR calc Af Amer 54 (L) >60 mL/min    Comment: (NOTE) The eGFR has been calculated using the CKD EPI equation. This calculation has not been validated in all clinical situations. eGFR's persistently <60 mL/min signify possible Chronic Kidney Disease.    Anion gap 7 5 - 15  CBC with Differential/Platelet     Status: Abnormal   Collection Time: 06/07/14  5:15 AM  Result Value Ref Range   WBC 20.9 (H) 4.0 -  10.5 K/uL   RBC 3.66 (L) 3.87 - 5.11 MIL/uL   Hemoglobin 10.5 (L) 12.0 - 15.0 g/dL   HCT 33.2 (L) 36.0 - 46.0 %   MCV 90.7 78.0 - 100.0 fL   MCH 28.7 26.0 - 34.0 pg   MCHC 31.6 30.0 - 36.0 g/dL   RDW 13.7 11.5 - 15.5 %   Platelets 280 150 - 400 K/uL   Neutrophils Relative % 78 (H) 43 - 77 %   Lymphocytes Relative 8 (L) 12 - 46 %   Monocytes Relative 14 (H) 3 - 12 %   Eosinophils Relative 0 0 - 5 %   Basophils Relative 0 0 - 1 %   Neutro Abs 16.3 (H) 1.7 - 7.7 K/uL   Lymphs Abs 1.7 0.7 - 4.0 K/uL   Monocytes Absolute 2.9 (H) 0.1 - 1.0 K/uL   Eosinophils Absolute 0.0 0.0 - 0.7 K/uL   Basophils Absolute 0.0 0.0 - 0.1 K/uL   Smear Review MORPHOLOGY UNREMARKABLE   Glucose, capillary     Status: Abnormal   Collection Time: 06/07/14  7:29 AM  Result Value Ref Range   Glucose-Capillary 49 (L) 65 - 99 mg/dL  Glucose, capillary     Status: None   Collection Time: 06/07/14  8:16 AM  Result Value Ref Range   Glucose-Capillary 84 65 - 99 mg/dL  Glucose, capillary     Status: Abnormal   Collection Time: 06/07/14  2:02 PM  Result Value Ref Range   Glucose-Capillary 53 (L) 65 - 99 mg/dL  Glucose, capillary     Status: Abnormal   Collection Time: 06/07/14  2:25 PM  Result Value Ref Range   Glucose-Capillary 62 (L) 65 - 99 mg/dL  Glucose, capillary     Status: None   Collection Time: 06/07/14  2:51 PM  Result Value Ref Range   Glucose-Capillary 90 65 - 99 mg/dL   Nm Hepatobiliary Liver Func  06/07/2014   CLINICAL DATA:  Right upper quadrant pain. Cholecystitis. Gallbladder sludge and wall thickening seen on recent ultrasound. Multiple liver masses. Carcinoid tumor.  EXAM: NUCLEAR MEDICINE HEPATOBILIARY IMAGING  TECHNIQUE: Sequential images of the abdomen were obtained out to 60 minutes following intravenous administration of radiopharmaceutical. Because of lack of gallbladder activity, imaging was continued for an additional 60 minutes following administration of 5.9 mg morphine  IV and  additional 2.0 mCi technetium 99 M Choletec.  RADIOPHARMACEUTICALS:  9.6 Tc-85mCholetec IV  COMPARISON:  Abdomen ultrasound on 06/05/2014 and abdomen CT on 06/01/2014  FINDINGS: Prompt radiopharmaceutical uptake by the liver is seen. The liver shows multiple photopenic masslike areas, consistent with numerous liver masses as seen on recent CT, suspicious for liver metastases.  Biliary excretion of activity is delayed indicating hepatocellular dysfunction. Biliary activity reaches small bowel initially on the 35 minutes image.  No gallbladder activity is visualized prior to morphine administration, as well as during additional 60 minutes of imaging following administration of intravenous morphine.  IMPRESSION: Absence of gallbladder activity, suggesting acute cholecystitis. Note that false positive results can occur in setting of setting of prolonged NPO status or gallbladder sludge.  No evidence of biliary obstruction.  Diffuse photopenic lesions throughout the liver, consistent liver metastases.   Electronically Signed   By: JEarle GellM.D.   On: 06/07/2014 14:09      Assessment/Plan Sepsis of unknown source on admission Leukocytosis  Elevated Alk phos Hyperbilirubinemia Cystic duct obstruction ?cholecystitis -Given metastatic carcinoid it is likely that her gallbladder will not be normal.  There is no filling of the gallbladder on HIDA consistent with bilary obstruction/cholecystitis.  Her WBC remains high, but is trending down.  LFT's are high, but improving.  -Due to chronic medical conditions would not recommend lap chole.  Would be reasonable to offer Perc chole tube as opposed to cholecystectomy at this time.   -Talked with her daughter [name] on the phone and both the patient and daughter are agreeable to proceed with perc chole drain.  They know it won't likely be until tomorrow.  IR consult ordered.   -NPO after MN Metastatic carcinoid tumor -Dr. FBurr Medicofollowing Chronic diastolic  CHF DVT Proph -Heparin SQ needs to be held [place] [facility] 06/07/2014, 4:30 PM Pager: [phone]

## 2014-06-07 NOTE — Progress Notes (Addendum)
Hypoglycemic Event  CBG: 52  Treatment:4 oz cranberry juice and 4 oz orange juice Symptoms: thirsty (pt just returned from Nuclear Medicine procedure) Follow-up CBG: Time:1425 CBG Result:64  Gave another 4 oz juice and crackers and peanut butter Follow up CBG Time: 1450 CBG Result: 90  Possible Reasons for Event:Pt was NPO and in [place] for [place]  Comments/MD notified:per protocol    Lind Guest, RN  Remember to initiate Hypoglycemia Order Set & complete

## 2014-06-07 NOTE — Progress Notes (Addendum)
Hypoglycemic Event  CBG: 49  Treatment:25 ml D% 50% IV  Symptoms: None Follow-up CBG: Time:0816 CBG Result:84  Possible Reasons for Event:Patient with decreased appetite  Comments/MD notified:per protocol    Lind Guest  Remember to initiate Hypoglycemia Order Set & complete

## 2014-06-08 ENCOUNTER — Inpatient Hospital Stay (HOSPITAL_COMMUNITY): Payer: Medicare Other

## 2014-06-08 DIAGNOSIS — D369 Benign neoplasm, unspecified site: Secondary | ICD-10-CM

## 2014-06-08 DIAGNOSIS — K819 Cholecystitis, unspecified: Secondary | ICD-10-CM

## 2014-06-08 DIAGNOSIS — K831 Obstruction of bile duct: Secondary | ICD-10-CM | POA: Insufficient documentation

## 2014-06-08 LAB — CBC WITH DIFFERENTIAL/PLATELET
BASOS ABS: 0 10*3/uL (ref 0.0–0.1)
BASOS PCT: 0 % (ref 0–1)
Eosinophils Absolute: 0.1 10*3/uL (ref 0.0–0.7)
Eosinophils Relative: 1 % (ref 0–5)
HCT: 33.3 % — ABNORMAL LOW (ref 36.0–46.0)
Hemoglobin: 10.3 g/dL — ABNORMAL LOW (ref 12.0–15.0)
Lymphocytes Relative: 8 % — ABNORMAL LOW (ref 12–46)
Lymphs Abs: 1.3 10*3/uL (ref 0.7–4.0)
MCH: 28.7 pg (ref 26.0–34.0)
MCHC: 30.9 g/dL (ref 30.0–36.0)
MCV: 92.8 fL (ref 78.0–100.0)
Monocytes Absolute: 2.2 10*3/uL — ABNORMAL HIGH (ref 0.1–1.0)
Monocytes Relative: 14 % — ABNORMAL HIGH (ref 3–12)
NEUTROS ABS: 12.6 10*3/uL — AB (ref 1.7–7.7)
NEUTROS PCT: 77 % (ref 43–77)
Platelets: 340 10*3/uL (ref 150–400)
RBC: 3.59 MIL/uL — AB (ref 3.87–5.11)
RDW: 13.8 % (ref 11.5–15.5)
WBC: 16.3 10*3/uL — ABNORMAL HIGH (ref 4.0–10.5)

## 2014-06-08 LAB — COMPREHENSIVE METABOLIC PANEL
ALBUMIN: 2 g/dL — AB (ref 3.5–5.0)
ALT: 16 U/L (ref 14–54)
AST: 24 U/L (ref 15–41)
Alkaline Phosphatase: 353 U/L — ABNORMAL HIGH (ref 38–126)
Anion gap: 7 (ref 5–15)
BUN: 14 mg/dL (ref 6–20)
CO2: 27 mmol/L (ref 22–32)
Calcium: 8 mg/dL — ABNORMAL LOW (ref 8.9–10.3)
Chloride: 104 mmol/L (ref 101–111)
Creatinine, Ser: 0.88 mg/dL (ref 0.44–1.00)
GFR calc Af Amer: >60 mL/min (ref >60)
Glucose, Bld: 109 mg/dL — ABNORMAL HIGH (ref 65–99)
Potassium: 3.4 mmol/L — ABNORMAL LOW (ref 3.5–5.1)
SODIUM: 138 mmol/L (ref 135–145)
Total Bilirubin: 1.2 mg/dL (ref 0.3–1.2)
Total Protein: 7 g/dL (ref 6.5–8.1)

## 2014-06-08 LAB — GLUCOSE, CAPILLARY
Glucose-Capillary: 119 mg/dL — ABNORMAL HIGH (ref 65–99)
Glucose-Capillary: 94 mg/dL (ref 65–99)
Glucose-Capillary: 87 mg/dL (ref 65–99)
Glucose-Capillary: 65 mg/dL (ref 65–99)
Glucose-Capillary: 102 mg/dL — ABNORMAL HIGH (ref 65–99)
Glucose-Capillary: 85 mg/dL (ref 65–99)
Glucose-Capillary: 212 mg/dL — ABNORMAL HIGH (ref 65–99)
Glucose-Capillary: 188 mg/dL — ABNORMAL HIGH (ref 65–99)

## 2014-06-08 LAB — LYMPHOCYTE SUBSETS, FLOW CYTOMETRY (INPT)

## 2014-06-08 LAB — PROTIME-INR
INR: 1.43 (ref 0.00–1.49)
Prothrombin Time: 17.6 seconds — ABNORMAL HIGH (ref 11.6–15.2)

## 2014-06-08 MED ORDER — SACCHAROMYCES BOULARDII 250 MG PO CAPS
250.0000 mg | ORAL_CAPSULE | Freq: Two times a day (BID) | ORAL | Status: DC
  Administered 2014-06-08 – 2014-06-11 (×5): 250 mg via ORAL
  Filled 2014-06-08 (×6): qty 1

## 2014-06-08 MED ORDER — DEXTROSE 50 % IV SOLN
INTRAVENOUS | Status: AC
  Administered 2014-06-08: 25 mL
  Filled 2014-06-08: qty 50

## 2014-06-08 MED ORDER — POTASSIUM CHLORIDE 10 MEQ/100ML IV SOLN
10.0000 meq | INTRAVENOUS | Status: AC
  Administered 2014-06-08 (×2): 10 meq via INTRAVENOUS
  Filled 2014-06-08 (×2): qty 100

## 2014-06-08 MED ORDER — INSULIN ASPART 100 UNIT/ML ~~LOC~~ SOLN: 0.0000 [IU] | Freq: Every day | SUBCUTANEOUS | Status: DC

## 2014-06-08 MED ORDER — INSULIN ASPART 100 UNIT/ML ~~LOC~~ SOLN
0.0000 [IU] | Freq: Three times a day (TID) | SUBCUTANEOUS | Status: DC
  Administered 2014-06-09 – 2014-06-10 (×5): 2 [IU] via SUBCUTANEOUS
  Administered 2014-06-11: 1 [IU] via SUBCUTANEOUS
  Administered 2014-06-11: 2 [IU] via SUBCUTANEOUS

## 2014-06-08 MED ORDER — DEXTROSE-NACL 5-0.45 % IV SOLN
INTRAVENOUS | Status: AC
  Administered 2014-06-08: 17:00:00 via INTRAVENOUS

## 2014-06-08 NOTE — Progress Notes (Signed)
Subjective: Patient familiar to IR service from prior liver biopsy which revealed metastatic well-differentiated neuroendocrine tumor. She was recently admitted with weakness, anorexia, and worsening right upper quadrant/epigastric abdominal pain. Subsequent imaging revealed abundant sludge in the gallbladder, gallbladder wall thickening and trace fluid around the gallbladder. HIDA scan performed on 06/07/14 revealed absence of gallbladder activity suggesting acute cholecystitis. Patient is currently afebrile however WBC count is currently at 16.3. Request has  now been received from surgery for consideration of percutaneous cholecystostomy.  Objective: Vital signs in last 24 hours: Temp:  [98.9 F (37.2 C)-99 F (37.2 C)] 98.9 F (37.2 C) (06/01 0644) Pulse Rate:  [73-90] 73 (06/01 0644) Resp:  [20] 20 (06/01 0644) BP: (121-129)/(54-82) 121/54 mmHg (06/01 0644) SpO2:  [95 %-99 %] 95 % (06/01 0644) Weight:  [324 lb 4.8 oz (147.1 kg)] 324 lb 4.8 oz (147.1 kg) (06/01 0644) Last BM Date: 06/08/14  Intake/Output from previous day: 05/31 0701 - 06/01 0700 In: 1757.5 [P.O.:720; I.V.:237.5; IV Piggyback:800] Out: -  Intake/Output this shift:   patient awake, alert. Chest with slightly diminished breath sounds at bases; heart with regular rate and rhythm; abdomen obese, soft, moderately tender right upper quadrant epigastric region even to gentle palpation; extremities with edema bilaterally and tenderness. Lab Results:   Recent Labs  06/07/14 0515 06/08/14 0630  WBC 20.9* 16.3*  HGB 10.5* 10.3*  HCT 33.2* 33.3*  PLT 280 340   BMET  Recent Labs  06/07/14 0515 06/08/14 0630  NA 132* 138  K 3.2* 3.4*  CL 100* 104  CO2 25 27  GLUCOSE 58* 109*  BUN 20 14  CREATININE 1.19* 0.88  CALCIUM 7.6* 8.0*   PT/INR  Recent Labs  06/06/14 0841 06/08/14 0630  LABPROT 17.5* 17.6*  INR 1.43 1.43   ABG No results for input(s): PHART, HCO3 in the last 72 hours.  Invalid input(s):  PCO2, PO2  Studies/Results: Nm Hepatobiliary Liver Func  06/07/2014   CLINICAL DATA:  Right upper quadrant pain. Cholecystitis. Gallbladder sludge and wall thickening seen on recent ultrasound. Multiple liver masses. Carcinoid tumor.  EXAM: NUCLEAR MEDICINE HEPATOBILIARY IMAGING  TECHNIQUE: Sequential images of the abdomen were obtained out to 60 minutes following intravenous administration of radiopharmaceutical. Because of lack of gallbladder activity, imaging was continued for an additional 60 minutes following administration of 5.9 mg morphine IV and additional 2.0 mCi technetium 99 M Choletec.  RADIOPHARMACEUTICALS:  9.6 Tc-95m Choletec IV  COMPARISON:  Abdomen ultrasound on 06/05/2014 and abdomen CT on 06/01/2014  FINDINGS: Prompt radiopharmaceutical uptake by the liver is seen. The liver shows multiple photopenic masslike areas, consistent with numerous liver masses as seen on recent CT, suspicious for liver metastases.  Biliary excretion of activity is delayed indicating hepatocellular dysfunction. Biliary activity reaches small bowel initially on the 35 minutes image.  No gallbladder activity is visualized prior to morphine administration, as well as during additional 60 minutes of imaging following administration of intravenous morphine.  IMPRESSION: Absence of gallbladder activity, suggesting acute cholecystitis. Note that false positive results can occur in setting of setting of prolonged NPO status or gallbladder sludge.  No evidence of biliary obstruction.  Diffuse photopenic lesions throughout the liver, consistent liver metastases.   Electronically Signed   By: Earle Gell M.D.   On: 06/07/2014 14:09    Anti-infectives: Anti-infectives    Start     Dose/Rate Route Frequency Ordered Stop   06/07/14 1500  vancomycin (VANCOCIN) 1,750 mg in sodium chloride 0.9 % 500 mL IVPB  1,750 mg 250 mL/hr over 120 Minutes Intravenous Every 24 hours 06/06/14 0930     06/07/14 1000  levofloxacin  (LEVAQUIN) IVPB 750 mg     750 mg 100 mL/hr over 90 Minutes Intravenous Every 24 hours 06/06/14 0930     06/06/14 1254  aztreonam (AZACTAM) 2 g in dextrose 5 % 50 mL IVPB     2 g 100 mL/hr over 30 Minutes Intravenous Every 8 hours 06/06/14 0930     06/06/14 0930  vancomycin (VANCOCIN) 2,500 mg in sodium chloride 0.9 % 500 mL IVPB     2,500 mg 250 mL/hr over 120 Minutes Intravenous  Once 06/06/14 0921 06/06/14 1731   06/06/14 0815  levofloxacin (LEVAQUIN) IVPB 750 mg     750 mg 100 mL/hr over 90 Minutes Intravenous  Once 06/06/14 0802 06/06/14 1246   06/06/14 0815  aztreonam (AZACTAM) 2 g in dextrose 5 % 50 mL IVPB     2 g 100 mL/hr over 30 Minutes Intravenous  Once 06/06/14 0802 06/06/14 1325   06/06/14 0815  vancomycin (VANCOCIN) IVPB 1000 mg/200 mL premix  Status:  Discontinued     1,000 mg 200 mL/hr over 60 Minutes Intravenous  Once 06/06/14 0802 06/06/14 0921   06/05/14 2200  metroNIDAZOLE (FLAGYL) IVPB 500 mg  Status:  Discontinued     500 mg 100 mL/hr over 60 Minutes Intravenous 3 times per day 06/05/14 1400 06/06/14 0802   06/05/14 2000  ciprofloxacin (CIPRO) IVPB 400 mg  Status:  Discontinued     400 mg 200 mL/hr over 60 Minutes Intravenous Every 12 hours 06/05/14 1410 06/06/14 0802   06/04/14 2200  metroNIDAZOLE (FLAGYL) tablet 500 mg  Status:  Discontinued     500 mg Oral 3 times per day 06/04/14 2124 06/05/14 1400   06/04/14 2130  ciprofloxacin (CIPRO) tablet 500 mg  Status:  Discontinued     500 mg Oral 2 times daily 06/04/14 2124 06/05/14 1400   06/04/14 1300  metroNIDAZOLE (FLAGYL) IVPB 500 mg  Status:  Discontinued     500 mg 100 mL/hr over 60 Minutes Intravenous 3 times per day 06/04/14 1218 06/04/14 2124   06/04/14 1300  ciprofloxacin (CIPRO) IVPB 400 mg  Status:  Discontinued     400 mg 200 mL/hr over 60 Minutes Intravenous Every 12 hours 06/04/14 1233 06/04/14 2124      Assessment/Plan: Patient with history of metastatic neuroendocrine tumor, CHF, diabetes,  hypertension, persistent right upper quadrant epigastric abdominal pain, leukocytosis, and imaging findings consistent with acute cholecystitis. Request has now been received from surgery for percutaneous cholecystostomy .Case has been reviewed by Dr. Annamaria Boots and patient appears to be an appropriate candidate for percutaneous cholecystostomy. Details/risks of procedure, including but not limited to, internal bleeding, sepsis, injury to adjacent organs, need for emergent surgery and death discussed with patient and daughter with their understanding and consent. Procedure planned for later today.  LOS: 3 days    D. Lennette Bihari Caspar Favila 06/08/2014 15 mins were spent eval pt for perc cholecystostomy

## 2014-06-08 NOTE — Progress Notes (Signed)
Hypoglycemic Event  CBG: 65  Treatment: D5 50% 25cc IV  Symptoms:lightheaded, tremors  Follow-up CBG: Time: 1550 CBG Result:102  Possible Reasons for Event: NPO status  Comments/MD notified:per protocol    Yesenia Lyons E  Remember to initiate Hypoglycemia Order Set & complete

## 2014-06-08 NOTE — Progress Notes (Signed)
Patient ID: Yesenia Lyons, female   DOB: 09-17-1950, 64 y.o.   MRN: [id number] Pt received SQ heparin today at 1420 . GB drain postponed until 6/2. Pt/nursing aware.

## 2014-06-08 NOTE — Clinical Social Work Note (Signed)
Clinical Social Work Assessment  Patient Details  Name: Yesenia Lyons MRN: [id number] Date of Birth: 14-Jan-1950  Date of referral:  06/08/14               Reason for consult:  [employer]                Permission sought to share information with:  Family Supports Permission granted to share information::  Yes, Verbal Permission Granted  Name::     Yesenia Lyons  Agency::     Relationship::  daughter  Contact Information:  [phone]  Housing/Transportation Living arrangements for the past 2 months:  [place] of Information:  Patient, Adult Children Patient Interpreter Needed:  None Criminal Activity/Legal Involvement Pertinent to Current Situation/Hospitalization:  No - Comment as needed Significant Relationships:  Adult Children Lives with:  Adult Children Do you feel safe going back to the place where you live?  Yes Need for family participation in patient care:  Yes (Comment) (per pt request)  Care giving concerns:  CSW received phone call from RN that pt requesting to speak with social worker.    Social Worker assessment / plan:  CSW received phone call from RN that pt requesting to speak to this CSW.   CSW visited pt room and met with pt at bedside. CSW introduced self and explained role. Pt reports that pt daughter wanted to speak with CSW. Pt provided permission for CSW to speak with pt daughter and called pt daughter from her cell phone. CSW spoke with pt daughter via telephone. Pt daughter reports that she had questions surrounding Medicaid and if pt qualified. CSW explained that pt daughter would have to go through the Department of Social Services to apply for Medicaid and DSS would be able to provide further information about eligibility. CSW explained that pt daughter may want to explore options of applying online for Medicaid. Pt daughter appreciative of information.   CSW discussed PT recommendation of [place] [facility] vs ST SNF. Pt and pt daughter both  state that plan is for home upon discharge with home health services. Pt daughter works for [employer].   CSW provided pt daughter with CSW contact information in case any further needs arise.   No further social work needs identified at this time.  CSW signing off.   Employment status:  Unemployed Nurse, adult PT Recommendations:  Home with [place], [place], [place] / Referral to community resources:  Other (Comment Required) (provided information to pt daughter about Department of Social Services to apply for Medicaid)  Patient/Family's Response to care:  Pt alert and oriented x 4. Pt daughter supportive and actively involved in pt care. Pt daughter hopeful to begin process of applying for Medicaid for pt.   Patient/Family's Understanding of and Emotional Response to Diagnosis, Current Treatment, and Prognosis:  Pt and pt daughter have good understanding surrounding pt diagnosis and treatment plan.   Emotional Assessment Appearance:  Appears stated age Attitude/Demeanor/Rapport:  Other (pt cooperative) Affect (typically observed):  Appropriate Orientation:  Oriented to Self, Oriented to Place, Oriented to  Time, Oriented to Situation Alcohol / Substance use:  Not Applicable Psych involvement (Current and /or in the community):  No (Comment)  Discharge Needs  Concerns to be addressed:  Financial / Insurance Concerns Readmission within the last 30 days:  No Current discharge risk:  None Barriers to Discharge:  No Barriers Identified   CSW signing off. Please  re-consult if social work needs arise.    KIDD, [place], LCSW 06/08/2014, 4:11 PM  [phone]

## 2014-06-08 NOTE — Evaluation (Signed)
Physical Therapy Evaluation Patient Details Name: Yesenia Lyons MRN: [id number] DOB: [birth year] Today's Date: 06/08/2014   History of Present Illness  64 yo female admitted with sepsis. Hx of obesity, HTN, DM, CHF, metastatic carcinoid tumor.  Clinical Impression  On eval, pt required Min assist (+2 for safety) for mobility-able to ambulate ~60 feet with RW. LOB x1 while ambulating requiring assist to stabilize. Pt fatigues easily and is generally weak. May need to consider ST rehab at SNF, depending on progress. Feel pt would have difficulty managing at home alone, at this time (daughter works).     Follow Up Recommendations Home health PT vs SNF (depending on progress. At this time, do not feel pt could manage safely at home alone. Could potentially [facility] from [facility]. )    Equipment Recommendations  None recommended by PT    Recommendations for Other Services OT consult     Precautions / Restrictions Precautions Precautions: Fall Restrictions Weight Bearing Restrictions: No      Mobility  Bed Mobility Overal bed mobility: Needs Assistance Bed Mobility: Supine to Sit;Sit to Supine     Supine to sit: HOB elevated;Min guard Sit to supine: Min assist   General bed mobility comments: Assist for LEs back onto bed. Increased time.   Transfers Overall transfer level: Needs assistance Equipment used: Rolling walker (2 wheeled) Transfers: Sit to/from [facility] Sit to Stand: Min assist;From elevated surface Stand pivot transfers: Min assist       General transfer comment: x2. Assist to rise, stabilize. VCs safety, hand placement. Increased time.   Ambulation/Gait Ambulation/Gait assistance: Min assist;+2 safety/equipment Ambulation Distance (Feet): 60 Feet Assistive device: Rolling walker (2 wheeled) Gait Pattern/deviations: Decreased stride length;Step-through pattern     General Gait Details: Assist to stabilize and follow with recliner. LOB x  1 while ambulaitng requiring Min assist to stabilize. Pt fatigues fairly easily. Remained on [place] O2-2L. Dyspnea 2/4  Stairs            Wheelchair Mobility    Modified Rankin (Stroke Patients Only)       Balance                                             Pertinent Vitals/Pain Pain Assessment: Faces Faces Pain Scale: Hurts little more Pain Location: abdomen Pain Intervention(s): Monitored during session    Home Living Family/patient expects to be discharged to:: Private residence Living Arrangements: Children Available Help at Discharge: Family;Available PRN/intermittently (daughter works)           Actor: Environmental consultant - 4 wheels;Shower seat      Prior Function Level of Independence: Independent with assistive device(s)               Hand Dominance        Extremity/Trunk Assessment   Upper Extremity Assessment: Defer to OT evaluation           Lower Extremity Assessment: Generalized weakness      Cervical / Trunk Assessment: Kyphotic  Communication   Communication: No difficulties  Cognition Arousal/Alertness: Awake/alert Behavior During Therapy: WFL for tasks assessed/performed Overall Cognitive Status: Within Functional Limits for tasks assessed                      General Comments      Exercises  Assessment/Plan    PT Assessment Patient needs continued PT services  PT Diagnosis Difficulty walking;Generalized weakness   PT Problem List Decreased strength;Decreased activity tolerance;Decreased balance;Decreased mobility;Pain;Obesity  PT Treatment Interventions DME instruction;Gait training;Functional mobility training;Therapeutic activities;Therapeutic exercise;Patient/family education;Balance training   PT Goals (Current goals can be found in the Care Plan section) Acute Rehab PT Goals Patient Stated Goal: none stated. PT Goal Formulation: With patient Time For Goal Achievement:  06/22/14 Potential to Achieve Goals: Good    Frequency Min 3X/week   Barriers to discharge        Co-evaluation               End of Session   Activity Tolerance: Patient limited by fatigue Patient left: in bed;with call bell/phone within reach           Time: 0819-0846 PT Time Calculation (min) (ACUTE ONLY): 27 min   Charges:   PT Evaluation $Initial PT Evaluation Tier I: 1 Procedure     PT G Codes:        Weston Anna, MPT Pager: [phone]

## 2014-06-08 NOTE — Evaluation (Signed)
Occupational Therapy Evaluation Patient Details Name: Yesenia Lyons MRN: [id number] DOB: April 11, 1950 Today's Date: 06/08/2014    History of Present Illness 64 yo female admitted with sepsis. Hx of obesity, HTN, DM, CHF, metastatic carcinoid tumor.   Clinical Impression   Pt was admitted for the above.  She will benefit from skilled OT to increase independence with adls.  Pt currently needs A x 2 for mobility for safety and mod A for LB adls/total A for hygiene.  Goals are for supervision in acute setting.    Follow Up Recommendations  Home health OT;SNF (depending upon progress)    Equipment Recommendations       Recommendations for Other Services       Precautions / Restrictions Precautions Precautions: Fall Restrictions Weight Bearing Restrictions: No      Mobility Bed Mobility Overal bed mobility: Needs Assistance Bed Mobility: Supine to Sit;Sit to Supine     Supine to sit: HOB elevated;Min guard Sit to supine: Min assist   General bed mobility comments: Assist for LEs back onto bed. Increased time.   Transfers Overall transfer level: Needs assistance Equipment used: Rolling walker (2 wheeled) Transfers: Sit to/from Stand Sit to Stand: Min assist;From elevated surface Stand pivot transfers: Min assist       General transfer comment: x2. Assist to rise, stabilize. VCs safety, hand placement. Increased time.     Balance                                            ADL Overall ADL's : Needs assistance/impaired                         Toilet Transfer: Minimal assistance;Stand-pivot;BSC;RW   Toileting- Clothing Manipulation and Hygiene: Total assistance;Sit to/from stand;+2 for safety/equipment         General ADL Comments: pt is able to perform UB adls with set up.  She normally wears pants and slip on shoes for LB dressing and needs mod A overall for LB adls.  Pt has been NPO for procedure later today.  Pt had one LOB when  ambulating requiring steadying:  +2 for safety with lines.     Vision     Perception     Praxis      Pertinent Vitals/Pain Pain Assessment: Faces Faces Pain Scale: Hurts little more Pain Location: abdomen Pain Intervention(s): Monitored during session;Limited activity within patient's tolerance     Hand Dominance     Extremity/Trunk Assessment Upper Extremity Assessment Upper Extremity Assessment: RUE deficits/detail;Generalized weakness RUE Deficits / Details: limited to 40 degrees FF due to pain      Cervical / Trunk Assessment Cervical / Trunk Assessment: Kyphotic   Communication Communication Communication: No difficulties   Cognition Arousal/Alertness: Awake/alert Behavior During Therapy: WFL for tasks assessed/performed Overall Cognitive Status: Within Functional Limits for tasks assessed                     General Comments       Exercises       Shoulder Instructions      Home Living Family/patient expects to be discharged to:: Private residence Living Arrangements: Children Available Help at Discharge: Family;Available PRN/intermittently Type of Home: House Home Access: Stairs to enter           [employer] Shower/Tub: Occupational psychologist:  Standard     Home Equipment: [place] - 4 wheels;Shower seat;Bedside commode   Additional Comments: Pt is home alone during the day      Prior Functioning/Environment Level of Independence: Independent with assistive device(s)             OT Diagnosis: Generalized weakness   OT Problem List: Decreased strength;Decreased activity tolerance;Decreased knowledge of use of DME or AE;Pain;Impaired balance (sitting and/or standing)   OT Treatment/Interventions: Self-care/ADL training;Energy conservation;DME and/or AE instruction;Balance training;Patient/family education    OT Goals(Current goals can be found in the care plan section) Acute Rehab OT Goals Patient Stated Goal: none  stated. OT Goal Formulation: With patient Time For Goal Achievement: 06/22/14 Potential to Achieve Goals: Good ADL Goals Pt Will Perform Lower Body Bathing: with supervision;with adaptive equipment;sit to/from stand Pt Will Perform Lower Body Dressing: with supervision;with adaptive equipment;sit to/from stand Pt Will Transfer to Toilet: with supervision;ambulating;bedside commode Pt Will Perform Toileting - Clothing Manipulation and hygiene: with supervision;sit to/from stand Additional ADL Goal #1: Pt will verbalize 3 energy conservation strategies  OT Frequency: Min 2X/week   Barriers to D/C:            Co-evaluation PT/OT/SLP Co-Evaluation/Treatment: Yes Reason for Co-Treatment: For patient/therapist safety PT goals addressed during session: Mobility/safety with mobility OT goals addressed during session: ADL's and self-care      End of Session    Activity Tolerance: Patient tolerated treatment well Patient left: in bed;with call bell/phone within reach   Time: 3903-0092 OT Time Calculation (min): 27 min Charges:  OT General Charges $OT Visit: 1 Procedure OT Evaluation $Initial OT Evaluation Tier I: 1 Procedure G-Codes:    Bushra Denman June 24, 2014, 9:23 AM  Lesle Chris, OTR/L [phone] 2014/06/24

## 2014-06-08 NOTE — Progress Notes (Signed)
[facility] Progress Note     Subjective: Still has exquisite pain in RUQ and epigastrium.  No N/V, NPO for procedure.  Awaiting perc GB drain.  Wants to eat.    Objective: Vital signs in last 24 hours: Temp:  [98.9 F (37.2 C)-99 F (37.2 C)] 98.9 F (37.2 C) (06/01 0644) Pulse Rate:  [73-90] 73 (06/01 0644) Resp:  [20] 20 (06/01 0644) BP: (121-129)/(54-82) 121/54 mmHg (06/01 0644) SpO2:  [95 %-99 %] 95 % (06/01 0644) Weight:  [147.1 kg (324 lb 4.8 oz)] 147.1 kg (324 lb 4.8 oz) (06/01 0644) Last BM Date: 06/08/14  Intake/Output from previous day: 05/31 0701 - 06/01 0700 In: 1757.5 [P.O.:720; I.V.:237.5; IV Piggyback:800] Out: -  Intake/Output this shift:    PE: Gen:  Alert, NAD, pleasant Abd: Obese soft, ND, tender in epigastrium and RUQ, +BS, no HSM   Lab Results:   Recent Labs  06/07/14 0515 06/08/14 0630  WBC 20.9* 16.3*  HGB 10.5* 10.3*  HCT 33.2* 33.3*  PLT 280 340   BMET  Recent Labs  06/07/14 0515 06/08/14 0630  NA 132* 138  K 3.2* 3.4*  CL 100* 104  CO2 25 27  GLUCOSE 58* 109*  BUN 20 14  CREATININE 1.19* 0.88  CALCIUM 7.6* 8.0*   PT/INR  Recent Labs  06/06/14 0841 06/08/14 0630  LABPROT 17.5* 17.6*  INR 1.43 1.43   CMP     Component Value Date/Time   NA 138 06/08/2014 0630   NA 139 05/26/2014 1513   K 3.4* 06/08/2014 0630   K 3.9 05/26/2014 1513   CL 104 06/08/2014 0630   CO2 27 06/08/2014 0630   CO2 25 05/26/2014 1513   GLUCOSE 109* 06/08/2014 0630   GLUCOSE 252* 05/26/2014 1513   BUN 14 06/08/2014 0630   BUN 10.2 05/26/2014 1513   CREATININE 0.88 06/08/2014 0630   CREATININE 1.0 05/26/2014 1513   CALCIUM 8.0* 06/08/2014 0630   CALCIUM 8.8 05/26/2014 1513   PROT 7.0 06/08/2014 0630   PROT 7.8 05/26/2014 1513   ALBUMIN 2.0* 06/08/2014 0630   ALBUMIN 2.9* 05/26/2014 1513   AST 24 06/08/2014 0630   AST 38* 05/26/2014 1513   ALT 16 06/08/2014 0630   ALT 37 05/26/2014 1513   ALKPHOS 353* 06/08/2014 0630   ALKPHOS 564* 05/26/2014 1513   BILITOT 1.2 06/08/2014 0630   BILITOT 1.94* 05/26/2014 1513   GFRNONAA >60 06/08/2014 0630   GFRAA >60 06/08/2014 0630   Lipase     Component Value Date/Time   LIPASE 11* 06/04/2014 0232       Studies/Results: Nm Hepatobiliary Liver Func  06/07/2014   CLINICAL DATA:  Right upper quadrant pain. Cholecystitis. Gallbladder sludge and wall thickening seen on recent ultrasound. Multiple liver masses. Carcinoid tumor.  EXAM: NUCLEAR MEDICINE HEPATOBILIARY IMAGING  TECHNIQUE: Sequential images of the abdomen were obtained out to 60 minutes following intravenous administration of radiopharmaceutical. Because of lack of gallbladder activity, imaging was continued for an additional 60 minutes following administration of 5.9 mg morphine IV and additional 2.0 mCi technetium 99 M Choletec.  RADIOPHARMACEUTICALS:  9.6 Tc-72mCholetec IV  COMPARISON:  Abdomen ultrasound on 06/05/2014 and abdomen CT on 06/01/2014  FINDINGS: Prompt radiopharmaceutical uptake by the liver is seen. The liver shows multiple photopenic masslike areas, consistent with numerous liver masses as seen on recent CT, suspicious for liver metastases.  Biliary excretion of activity is delayed indicating hepatocellular dysfunction. Biliary activity reaches small bowel initially on the 35 minutes  image.  No gallbladder activity is visualized prior to morphine administration, as well as during additional 60 minutes of imaging following administration of intravenous morphine.  IMPRESSION: Absence of gallbladder activity, suggesting acute cholecystitis. Note that false positive results can occur in setting of setting of prolonged NPO status or gallbladder sludge.  No evidence of biliary obstruction.  Diffuse photopenic lesions throughout the liver, consistent liver metastases.   Electronically Signed   By: Earle Gell M.D.   On: 06/07/2014 14:09    Anti-infectives: Anti-infectives    Start     Dose/Rate Route  Frequency Ordered Stop   06/07/14 1500  vancomycin (VANCOCIN) 1,750 mg in sodium chloride 0.9 % 500 mL IVPB     1,750 mg 250 mL/hr over 120 Minutes Intravenous Every 24 hours 06/06/14 0930     06/07/14 1000  levofloxacin (LEVAQUIN) IVPB 750 mg     750 mg 100 mL/hr over 90 Minutes Intravenous Every 24 hours 06/06/14 0930     06/06/14 1254  aztreonam (AZACTAM) 2 g in dextrose 5 % 50 mL IVPB     2 g 100 mL/hr over 30 Minutes Intravenous Every 8 hours 06/06/14 0930     06/06/14 0930  vancomycin (VANCOCIN) 2,500 mg in sodium chloride 0.9 % 500 mL IVPB     2,500 mg 250 mL/hr over 120 Minutes Intravenous  Once 06/06/14 0921 06/06/14 1731   06/06/14 0815  levofloxacin (LEVAQUIN) IVPB 750 mg     750 mg 100 mL/hr over 90 Minutes Intravenous  Once 06/06/14 0802 06/06/14 1246   06/06/14 0815  aztreonam (AZACTAM) 2 g in dextrose 5 % 50 mL IVPB     2 g 100 mL/hr over 30 Minutes Intravenous  Once 06/06/14 0802 06/06/14 1325   06/06/14 0815  vancomycin (VANCOCIN) IVPB 1000 mg/200 mL premix  Status:  Discontinued     1,000 mg 200 mL/hr over 60 Minutes Intravenous  Once 06/06/14 0802 06/06/14 0921   06/05/14 2200  metroNIDAZOLE (FLAGYL) IVPB 500 mg  Status:  Discontinued     500 mg 100 mL/hr over 60 Minutes Intravenous 3 times per day 06/05/14 1400 06/06/14 0802   06/05/14 2000  ciprofloxacin (CIPRO) IVPB 400 mg  Status:  Discontinued     400 mg 200 mL/hr over 60 Minutes Intravenous Every 12 hours 06/05/14 1410 06/06/14 0802   06/04/14 2200  metroNIDAZOLE (FLAGYL) tablet 500 mg  Status:  Discontinued     500 mg Oral 3 times per day 06/04/14 2124 06/05/14 1400   06/04/14 2130  ciprofloxacin (CIPRO) tablet 500 mg  Status:  Discontinued     500 mg Oral 2 times daily 06/04/14 2124 06/05/14 1400   06/04/14 1300  metroNIDAZOLE (FLAGYL) IVPB 500 mg  Status:  Discontinued     500 mg 100 mL/hr over 60 Minutes Intravenous 3 times per day 06/04/14 1218 06/04/14 2124   06/04/14 1300  ciprofloxacin (CIPRO) IVPB  400 mg  Status:  Discontinued     400 mg 200 mL/hr over 60 Minutes Intravenous Every 12 hours 06/04/14 1233 06/04/14 2124       Assessment/Plan Sepsis of unknown source on admission Leukocytosis  Elevated Alk phos Hyperbilirubinemia Cystic duct obstruction ?cholecystitis -Given metastatic carcinoid it is likely that her gallbladder will not be normal. There is no filling of the gallbladder on HIDA consistent with bilary obstruction/cholecystitis. Her WBC remains high, but is trending down. LFT's are high at baseline, but improving.  -Due to chronic medical conditions would not recommend lap  chole. Would be reasonable to offer Perc chole tube as opposed to cholecystectomy at this time.  -Talked with her daughter [name] on the phone yesterday and both the patient and daughter are agreeable to proceed with perc chole drain. IR pending perc drain today. Metastatic carcinoid tumor -Dr. Burr Medico following Chronic diastolic CHF DVT Proph -Heparin SQ needs to be held prior to perc chole    LOS: 3 days    Nat Christen 06/08/2014, 9:41 AM Pager: [phone]

## 2014-06-08 NOTE — Progress Notes (Signed)
[facility] PROGRESS NOTE  Yesenia Lyons ZOX:[id number] DOB: 11/21/1950 DOA: 06/03/2014 PCP: Isaias Cowman, PA-C   Off Service Summary: [phone] with metastatic carcinoid tumor followed by Dr. Burr Medico who presented initially with altered mental status, found to be floridly septic with LFT's more elevated than baseline. Abd [place] was suspicious for acute cholecystitis and the patient was started on cipro/flagyl given PCN allergy. Surgery was called, who recommended HIDA scan. The patient's WBC continued to worsen and abx were broadened to vanc, levaquin, and aztreonam. WBC has since improved. Follow up HIDA was ultimately obtained which also suggested colicystitis and Surgery was formally consulted with recommendations for IR guided perc drain.   Assessment/Plan: 1. Acute encephalopathy 1. Improved since admission 2. Suspect possible toxic-metabolic encephalopathy from acute infection 2. Metastatic Carcinoid tumor 1. Normally followed by Dr. Truitt Merle 2. Appreciate Dr. Burr Medico assistance 3. Pt had been on Octreotide as an outpatient 3. Elevated transaminases, elevated bilirubin 1. LFT's trending down 2. Presenting bili had trended up, peaking at 3.0, now down to normal 3. Recent CT abd on 5/26 was unremarkable for biliary obstruction 4. Recent abd [place] with findings of significant sludge with GB wall thickening and trace fluid around gallbladder 5. HIDA demononstrated no contrast in GB c/w with acute cholecystitis 6. IR to perform cholecystostomy drain placement today 7. Appreciate general surgery assistance 4. Pseudohyponatremia with hyperglycemia 1. Sodium improved 5. DM2 with episode of hypoglycemia, none since last night 1. Had initially been continued on lantus 55 units BID with 10 units pre-meal insulin 2. CBG well controlled currently 3. continue SSI alone for now 6. HTN 1. BP remains stable 7. Chronic diastolic CHF 1. Compensated on exam 2. Cont to monitor 3. held diuretics secondary to  mildly elevated Cr but will resume tomorrow 8. Sepsis from unclear source, possible cholecystitis 1. Pt with fevers on presentation with WBC of 22k, peaking at 28k 2. Per above, total bili is newly elevated this admission with RUQ pain 3. Patient was initially started on empiric cipro and flagyl to cover possible cholecystits in the setting of PCN allergy 4. Pt's sepsis worsened and abx were broadened to  aztreonam, levaquin, and vanc with improved WBC  5. BCx NGTD 6. Culture gallbladder fluid 9. DVT prophylaxis 1. Heparin subQ 10. Constipation 1. Recent CT abd from 5/26 personally reviewed with large stool noted in the ascending through transverse colons 2. Improvement noted following cathartics, now having diarrhea 11. Diarrhea, may be related to laxatives or carcinoid but at risk for C. Diff 1. C. Diff PCR with precautions pending results 12. Acute hypoxic respiratory failure due to sepsis 1. Wean oxygen   Code Status: Full Family Communication: Pt in room Disposition Plan: Pending cholecystotomy drain, likely home in a few days.  Wean O2   Consultants:  General surgery  IR  Procedures:  CT abd/pelvis  Abd [place]  HIDA  Antibiotics:  Ciprofloxacin 5/28>>>5/30  Flagyl 5/28>>>5/30  Aztreonam 5/30>>>  Levaquin 5/30>>>  Vancomycin 5/30>>>  HPI/Subjective: Patient states she feels about the same today.  5/10 pain in RUQ after recent exam by PA.  Denies nausea, vomiting.  Having watery diarrhea about 3-4 times per day.    Objective: Filed Vitals:   06/07/14 0521 06/07/14 1407 06/07/14 2107 06/08/14 0644  BP: 123/65 123/65 129/82 121/54  Pulse: 78 77 90 73  Temp: 100.6 F (38.1 C) 98.9 F (37.2 C) 99 F (37.2 C) 98.9 F (37.2 C)  TempSrc: Oral Oral Oral Oral  Resp: 22 20 20  20  Height:      Weight:    147.1 kg (324 lb 4.8 oz)  SpO2: 99% 99% 95% 95%    Intake/Output Summary (Last 24 hours) at 06/08/14 0954 Last data filed at 06/08/14 5170  Gross per 24  hour  Intake 1757.5 ml  Output      0 ml  Net 1757.5 ml   Filed Weights   06/06/14 0452 06/07/14 0427 06/08/14 0644  Weight: 147.1 kg (324 lb 4.8 oz) 146.6 kg (323 lb 3.1 oz) 147.1 kg (324 lb 4.8 oz)    Exam:   General:  Obese F, laying in bed in nad, oriented  Cardiovascular: regular, s1, s2  Respiratory: normal resp effort, no wheeing  Abdomen:   NABS, soft, ND, TTP in the RUQ with some guarding, no rebounding.  Less TTP but present in other quadrants.   tender over epigastric region  Musculoskeletal: Perfused, no clubbing, no cyanosis, trace bilateral pitting LEE  Data Reviewed: Basic Metabolic Panel:  Recent Labs Lab 06/04/14 2235 06/05/14 0600 06/06/14 0330 06/07/14 0515 06/08/14 0630  NA 136 135 136 132* 138  K 4.3 5.2* 3.7 3.2* 3.4*  CL 99* 98* 98* 100* 104  CO2 27 28 26 25 27   GLUCOSE 121* 139* 75 58* 109*  BUN 18 20 24* 20 14  CREATININE 0.96 1.25* 1.48* 1.19* 0.88  CALCIUM 8.6* 8.4* 8.3* 7.6* 8.0*   Liver Function Tests:  Recent Labs Lab 06/04/14 0232 06/05/14 0600 06/06/14 0330 06/07/14 0515 06/08/14 0630  AST 50* 59* 42* 29 24  ALT 43 22 25 17 16   ALKPHOS 516* 405* 380* 298* 353*  BILITOT 2.2* 3.0* 2.9* 2.0* 1.2  PROT 8.0 7.1 7.9 6.9 7.0  ALBUMIN 3.1* 2.6* 2.8* 2.2* 2.0*    Recent Labs Lab 06/04/14 0232  LIPASE 11*    Recent Labs Lab 06/04/14 0230  AMMONIA 29   CBC:  Recent Labs Lab 06/03/14 2240 06/04/14 0232 06/05/14 0600 06/06/14 0330 06/07/14 0515 06/08/14 0630  WBC 20.0* 22.1* 25.3* 28.9* 20.9* 16.3*  NEUTROABS 15.4* 16.3*  --   --  16.3* 12.6*  HGB 13.2 12.1 12.3 12.0 10.5* 10.3*  HCT 41.1 37.9 38.2 37.9 33.2* 33.3*  MCV 91.5 91.8 92.9 90.5 90.7 92.8  PLT 295 289 309 324 280 340   Cardiac Enzymes:  Recent Labs Lab 06/03/14 2240  TROPONINI <0.03   BNP (last 3 results) No results for input(s): BNP in the last 8760 hours.  ProBNP (last 3 results) No results for input(s): PROBNP in the last 8760  hours.  CBG:  Recent Labs Lab 06/07/14 1425 06/07/14 1451 06/07/14 1628 06/07/14 2128 06/08/14 0747  GLUCAP 62* 90 167* 119* 94    Recent Results (from the past 240 hour(s))  Culture, blood (routine x 2)     Status: None (Preliminary result)   Collection Time: 06/04/14  8:29 AM  Result Value Ref Range Status   Specimen Description BLOOD RIGHT ARM  Final   Special Requests   Final    BOTTLES DRAWN AEROBIC AND ANAEROBIC 10CC BOTH BOTTLES   Culture   Final           BLOOD CULTURE RECEIVED NO GROWTH TO DATE CULTURE WILL BE HELD FOR 5 DAYS BEFORE ISSUING A FINAL NEGATIVE REPORT Performed at [facility]    Report Status PENDING  Incomplete  Culture, blood (routine x 2)     Status: None (Preliminary result)   Collection Time: 06/04/14  8:40 AM  Result Value Ref Range Status   Specimen Description BLOOD RIGHT ARM  Final   Special Requests BOTTLES DRAWN AEROBIC ONLY 5CC BLUE BOTTLE  Final   Culture   Final           BLOOD CULTURE RECEIVED NO GROWTH TO DATE CULTURE WILL BE HELD FOR 5 DAYS BEFORE ISSUING A FINAL NEGATIVE REPORT Performed at [facility]    Report Status PENDING  Incomplete     Studies: Nm Hepatobiliary Liver Func  06/07/2014   CLINICAL DATA:  Right upper quadrant pain. Cholecystitis. Gallbladder sludge and wall thickening seen on recent ultrasound. Multiple liver masses. Carcinoid tumor.  EXAM: NUCLEAR MEDICINE HEPATOBILIARY IMAGING  TECHNIQUE: Sequential images of the abdomen were obtained out to 60 minutes following intravenous administration of radiopharmaceutical. Because of lack of gallbladder activity, imaging was continued for an additional 60 minutes following administration of 5.9 mg morphine IV and additional 2.0 mCi technetium 99 M Choletec.  RADIOPHARMACEUTICALS:  9.6 Tc-59m Choletec IV  COMPARISON:  Abdomen ultrasound on 06/05/2014 and abdomen CT on 06/01/2014  FINDINGS: Prompt radiopharmaceutical uptake by the liver is seen. The liver shows  multiple photopenic masslike areas, consistent with numerous liver masses as seen on recent CT, suspicious for liver metastases.  Biliary excretion of activity is delayed indicating hepatocellular dysfunction. Biliary activity reaches small bowel initially on the 35 minutes image.  No gallbladder activity is visualized prior to morphine administration, as well as during additional 60 minutes of imaging following administration of intravenous morphine.  IMPRESSION: Absence of gallbladder activity, suggesting acute cholecystitis. Note that false positive results can occur in setting of setting of prolonged NPO status or gallbladder sludge.  No evidence of biliary obstruction.  Diffuse photopenic lesions throughout the liver, consistent liver metastases.   Electronically Signed   By: Earle Gell M.D.   On: 06/07/2014 14:09    Scheduled Meds: . amLODipine  5 mg Oral Daily  . atenolol  25 mg Oral Daily  . aztreonam  2 g Intravenous Q8H  . ferrous gluconate  324 mg Oral BID WC  . gabapentin  400 mg Oral TID  . heparin  5,000 Units Subcutaneous 3 times per day  . heparin lock flush  500 Units Intravenous Once  . insulin aspart  0-20 Units Subcutaneous TID WC  . insulin aspart  0-5 Units Subcutaneous QHS  . levofloxacin (LEVAQUIN) IV  750 mg Intravenous Q24H  . potassium chloride  10 mEq Intravenous Q1 Hr x 2  . sodium chloride  3 mL Intravenous Q12H  . vancomycin  1,750 mg Intravenous Q24H   Continuous Infusions: . 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 If 7PM-7AM, please contact  night-coverage at [email or web address], password [facility] 06/08/2014, 9:53 AM  LOS: 3 days

## 2014-06-09 ENCOUNTER — Inpatient Hospital Stay (HOSPITAL_COMMUNITY): Payer: Medicare Other

## 2014-06-09 LAB — COMPREHENSIVE METABOLIC PANEL
ALBUMIN: 2 g/dL — AB (ref 3.5–5.0)
ALT: 16 U/L (ref 14–54)
AST: 23 U/L (ref 15–41)
Alkaline Phosphatase: 516 U/L — ABNORMAL HIGH (ref 38–126)
Anion gap: 8 (ref 5–15)
BUN: 11 mg/dL (ref 6–20)
CO2: 26 mmol/L (ref 22–32)
Calcium: 8.2 mg/dL — ABNORMAL LOW (ref 8.9–10.3)
Chloride: 104 mmol/L (ref 101–111)
Creatinine, Ser: 0.77 mg/dL (ref 0.44–1.00)
GFR calc Af Amer: >60 mL/min (ref >60)
GFR calc non Af Amer: >60 mL/min (ref >60)
GLUCOSE: 155 mg/dL — AB (ref 65–99)
Potassium: 3.4 mmol/L — ABNORMAL LOW (ref 3.5–5.1)
SODIUM: 138 mmol/L (ref 135–145)
TOTAL PROTEIN: 7 g/dL (ref 6.5–8.1)
Total Bilirubin: 1.1 mg/dL (ref 0.3–1.2)

## 2014-06-09 LAB — CBC
HCT: 34.5 % — ABNORMAL LOW (ref 36.0–46.0)
HEMOGLOBIN: 10.5 g/dL — AB (ref 12.0–15.0)
MCH: 28.2 pg (ref 26.0–34.0)
MCHC: 30.4 g/dL (ref 30.0–36.0)
MCV: 92.7 fL (ref 78.0–100.0)
Platelets: 406 10*3/uL — ABNORMAL HIGH (ref 150–400)
RBC: 3.72 MIL/uL — ABNORMAL LOW (ref 3.87–5.11)
RDW: 13.8 % (ref 11.5–15.5)
WBC: 13.8 10*3/uL — AB (ref 4.0–10.5)

## 2014-06-09 LAB — GLUCOSE, CAPILLARY
GLUCOSE-CAPILLARY: 149 mg/dL — AB (ref 65–99)
GLUCOSE-CAPILLARY: 174 mg/dL — AB (ref 65–99)
Glucose-Capillary: 173 mg/dL — ABNORMAL HIGH (ref 65–99)
Glucose-Capillary: 179 mg/dL — ABNORMAL HIGH (ref 65–99)

## 2014-06-09 LAB — VANCOMYCIN, RANDOM: Vancomycin Rm: 19 ug/mL

## 2014-06-09 MED ORDER — FENTANYL CITRATE (PF) 100 MCG/2ML IJ SOLN
INTRAMUSCULAR | Status: AC
  Filled 2014-06-09: qty 4

## 2014-06-09 MED ORDER — ACETAMINOPHEN 500 MG PO TABS
1000.0000 mg | ORAL_TABLET | Freq: Three times a day (TID) | ORAL | Status: DC
  Administered 2014-06-09 – 2014-06-11 (×7): 1000 mg via ORAL
  Filled 2014-06-09 (×7): qty 2

## 2014-06-09 MED ORDER — POTASSIUM CHLORIDE CRYS ER 20 MEQ PO TBCR
40.0000 meq | EXTENDED_RELEASE_TABLET | Freq: Once | ORAL | Status: AC
  Administered 2014-06-09: 40 meq via ORAL
  Filled 2014-06-09: qty 2

## 2014-06-09 MED ORDER — IOHEXOL 300 MG/ML  SOLN
50.0000 mL | Freq: Once | INTRAMUSCULAR | Status: AC | PRN
  Administered 2014-06-09: 10 mL

## 2014-06-09 MED ORDER — ALUM & MAG HYDROXIDE-SIMETH 200-200-20 MG/5ML PO SUSP: 30.0000 mL | Freq: Four times a day (QID) | ORAL | Status: DC | PRN

## 2014-06-09 MED ORDER — POLYETHYLENE GLYCOL 3350 17 G PO PACK: 17.0000 g | PACK | Freq: Two times a day (BID) | ORAL | Status: DC | PRN

## 2014-06-09 MED ORDER — MIDAZOLAM HCL 2 MG/2ML IJ SOLN
INTRAMUSCULAR | Status: AC | PRN
  Administered 2014-06-09 (×3): 1 mg via INTRAVENOUS

## 2014-06-09 MED ORDER — TRAMADOL HCL 50 MG PO TABS
50.0000 mg | ORAL_TABLET | Freq: Four times a day (QID) | ORAL | Status: DC | PRN
  Administered 2014-06-09: 50 mg via ORAL

## 2014-06-09 MED ORDER — PHENOL 1.4 % MT LIQD: 2.0000 | OROMUCOSAL | Status: DC | PRN

## 2014-06-09 MED ORDER — BISACODYL 10 MG RE SUPP: 10.0000 mg | Freq: Every day | RECTAL | Status: DC | PRN

## 2014-06-09 MED ORDER — FENTANYL CITRATE (PF) 100 MCG/2ML IJ SOLN
INTRAMUSCULAR | Status: AC | PRN
  Administered 2014-06-09: 50 ug via INTRAVENOUS

## 2014-06-09 MED ORDER — MIDAZOLAM HCL 2 MG/2ML IJ SOLN
INTRAMUSCULAR | Status: AC
  Filled 2014-06-09: qty 6

## 2014-06-09 MED ORDER — INSULIN GLARGINE 100 UNIT/ML ~~LOC~~ SOLN
10.0000 [IU] | Freq: Every day | SUBCUTANEOUS | Status: DC
Start: 2014-06-09 — End: 2014-06-11
  Administered 2014-06-09 – 2014-06-10 (×2): 10 [IU] via SUBCUTANEOUS
  Filled 2014-06-09 (×3): qty 0.1

## 2014-06-09 MED ORDER — VANCOMYCIN HCL 10 G IV SOLR
1500.0000 mg | Freq: Two times a day (BID) | INTRAVENOUS | Status: DC
  Administered 2014-06-09 – 2014-06-11 (×4): 1500 mg via INTRAVENOUS
  Filled 2014-06-09 (×5): qty 1500

## 2014-06-09 MED ORDER — MENTHOL 3 MG MT LOZG: 1.0000 | LOZENGE | OROMUCOSAL | Status: DC | PRN

## 2014-06-09 MED ORDER — LIP MEDEX EX OINT
1.0000 "application " | TOPICAL_OINTMENT | Freq: Two times a day (BID) | CUTANEOUS | Status: DC
  Administered 2014-06-09 – 2014-06-11 (×5): 1 via TOPICAL
  Filled 2014-06-09: qty 7

## 2014-06-09 MED ORDER — HYDROCODONE-ACETAMINOPHEN 5-325 MG PO TABS: 1.0000 | ORAL_TABLET | ORAL | Status: DC | PRN

## 2014-06-09 MED ORDER — LIDOCAINE HCL 1 % IJ SOLN
INTRAMUSCULAR | Status: AC
  Filled 2014-06-09: qty 20

## 2014-06-09 MED ORDER — POLYETHYLENE GLYCOL 3350 17 G PO PACK
17.0000 g | PACK | Freq: Two times a day (BID) | ORAL | Status: DC
Start: 2014-06-09 — End: 2014-06-11
  Filled 2014-06-09: qty 1

## 2014-06-09 MED ORDER — MAGIC MOUTHWASH
15.0000 mL | Freq: Four times a day (QID) | ORAL | Status: DC | PRN
Start: 2014-06-09 — End: 2014-06-11
  Filled 2014-06-09: qty 15

## 2014-06-09 NOTE — Progress Notes (Addendum)
[facility] PROGRESS NOTE  Yesenia Lyons TGY:[id number] DOB: [birth year] DOA: 06/03/2014 PCP: Isaias Cowman, PA-C   Off Service Summary: [phone] with metastatic carcinoid tumor followed by Dr. Burr Medico who presented initially with altered mental status, found to be floridly septic with LFT's more elevated than baseline. Abd [place] was suspicious for acute cholecystitis and the patient was started on cipro/flagyl given PCN allergy. Surgery was called, who recommended HIDA scan. The patient's WBC continued to worsen and abx were broadened to vanc, levaquin, and aztreonam. WBC has since improved. Follow up HIDA was ultimately obtained which also suggested colicystitis and Surgery was formally consulted with recommendations for IR guided perc drain which she received on 6/2  Assessment/Plan: 1. Acute encephalopathy 1. Improved since admission 2. Suspect possible toxic-metabolic encephalopathy from acute infection 2. Metastatic Carcinoid tumor 1. Normally followed by Dr. Truitt Merle 2. Appreciate Dr. Burr Medico assistance 3. Pt had been on Octreotide as an outpatient 3. Elevated transaminases, elevated bilirubin which has returned to normal however her alkaline phosphatase has increased to 516 1. LFT's normalized 2. Presenting bili had trended up, peaking at 3.0, now down to normal 3. Recent CT abd on 5/26 was unremarkable for biliary obstruction 4. Recent abd [place] with findings of significant sludge with GB wall thickening and trace fluid around gallbladder 5. HIDA demononstrated no contrast in GB c/w with acute cholecystitis 6. IR performed cholecystostomy drain placement on 6/2 7. Appreciate general surgery assistance 4. Pseudohyponatremia with hyperglycemia 1. Sodium improved 5. DM2 with stable blood sugars for the last 24 hours 1. Start Lantus 10 units qhs this evennig 2. CBG well controlled currently 3. continue SSI 6. HTN 1. BP remains stable 7. Chronic diastolic CHF 1. Compensated on exam 2. Cont to  monitor 3. Resume direct tomorrow  8. Sepsis from unclear source, possible cholecystitis 1. Pt with fevers on presentation with WBC of 22k, peaking at 28k 2. Per above, total bili is newly elevated this admission with RUQ pain 3. Patient was initially started on empiric cipro and flagyl to cover possible cholecystits in the setting of PCN allergy 4. Pt's sepsis worsened and abx were broadened to  aztreonam, levaquin, and vanc with improved WBC  5. BCx NGTD 6. Follow-up Culture gallbladder fluid 9. DVT prophylaxis 1. Heparin subQ 10. Constipation 1. Recent CT abd from 5/26 personally reviewed with large stool noted in the ascending through transverse colons 2. Improvement noted following cathartics, now having diarrhea 11. Diarrhea, may be related to laxatives or carcinoid but at risk for C. Diff 1. C. Diff PCR with precautions pending results, last diarrhea was this morning 12. Acute hypoxic respiratory failure due to sepsis, resolved  Code Status: Full Family Communication: Pt in room Disposition Plan:   Pending the results of her cholecystostomy fluid culture, able to tolerate diet, anticipate home in 2 days   Consultants:  General surgery  IR  Procedures:  CT abd/pelvis  Abd [place]  HIDA  Antibiotics:  Ciprofloxacin 5/28>>>5/30  Flagyl 5/28>>>5/30  Aztreonam 5/30>>>  Levaquin 5/30>>>  Vancomycin 5/30>>>  HPI/Subjective: Patient continues to have diarrhea, had watery diarrhea she thinks this morning when she urinated. The nurses have not been able to collect a sample because she has voided each time she has had diarrhea. Her pain was much better after her cholecystostomy drain was placed initially, then returned, but has since resolved with pain medication. She denies nausea, vomiting. She is sad because she missed her grandsons kindergarten graduation today.    Objective: Filed Vitals:  06/09/14 0854 06/09/14 0859 06/09/14 0904 06/09/14 1342  BP: 148/92 149/93  156/100 127/71  Pulse: 72 74 77 56  Temp:    97.4 F (36.3 C)  TempSrc:    Axillary  Resp: 18 24 24 22   Height:      Weight:      SpO2: 98% 98% 97% 93%    Intake/Output Summary (Last 24 hours) at 06/09/14 1420 Last data filed at 06/09/14 1344  Gross per 24 hour  Intake 1108.75 ml  Output   1400 ml  Net -291.25 ml   Filed Weights   06/07/14 0427 06/08/14 0644 06/09/14 0551  Weight: 146.6 kg (323 lb 3.1 oz) 147.1 kg (324 lb 4.8 oz) 146.8 kg (323 lb 10.2 oz)    Exam:   General:  Obese F, laying in bed in nad, oriented  Cardiovascular: regular, s1, s2  Respiratory: normal resp effort, no wheeing  Abdomen:   NABS, soft, ND, TTP in the RUQ, epigastric area, umbilical area with some guarding, no rebounding.  Less TTP but present in other quadrants.   tender over epigastric region. The drain contains very dark colored mostly clear fluid.  Musculoskeletal: Perfused, no clubbing, no cyanosis, trace bilateral pitting LEE  Data Reviewed: Basic Metabolic Panel:  Recent Labs Lab 06/05/14 0600 06/06/14 0330 06/07/14 0515 06/08/14 0630 06/09/14 0512  NA 135 136 132* 138 138  K 5.2* 3.7 3.2* 3.4* 3.4*  CL 98* 98* 100* 104 104  CO2 28 26 25 27 26   GLUCOSE 139* 75 58* 109* 155*  BUN 20 24* 20 14 11   CREATININE 1.25* 1.48* 1.19* 0.88 0.77  CALCIUM 8.4* 8.3* 7.6* 8.0* 8.2*   Liver Function Tests:  Recent Labs Lab 06/05/14 0600 06/06/14 0330 06/07/14 0515 06/08/14 0630 06/09/14 0512  AST 59* 42* 29 24 23   ALT 22 25 17 16 16   ALKPHOS 405* 380* 298* 353* 516*  BILITOT 3.0* 2.9* 2.0* 1.2 1.1  PROT 7.1 7.9 6.9 7.0 7.0  ALBUMIN 2.6* 2.8* 2.2* 2.0* 2.0*    Recent Labs Lab 06/04/14 0232  LIPASE 11*    Recent Labs Lab 06/04/14 0230  AMMONIA 29   CBC:  Recent Labs Lab 06/03/14 2240 06/04/14 0232 06/05/14 0600 06/06/14 0330 06/07/14 0515 06/08/14 0630 06/09/14 0512  WBC 20.0* 22.1* 25.3* 28.9* 20.9* 16.3* 13.8*  NEUTROABS 15.4* 16.3*  --   --  16.3*  12.6*  --   HGB 13.2 12.1 12.3 12.0 10.5* 10.3* 10.5*  HCT 41.1 37.9 38.2 37.9 33.2* 33.3* 34.5*  MCV 91.5 91.8 92.9 90.5 90.7 92.8 92.7  PLT 295 289 309 324 280 340 406*   Cardiac Enzymes:  Recent Labs Lab 06/03/14 2240  TROPONINI <0.03   BNP (last 3 results) No results for input(s): BNP in the last 8760 hours.  ProBNP (last 3 results) No results for input(s): PROBNP in the last 8760 hours.  CBG:  Recent Labs Lab 06/08/14 1654 06/08/14 2034 06/08/14 2231 06/09/14 0755 06/09/14 1143  GLUCAP 85 212* 188* 149* 174*    Recent Results (from the past 240 hour(s))  Culture, blood (routine x 2)     Status: None (Preliminary result)   Collection Time: 06/04/14  8:29 AM  Result Value Ref Range Status   Specimen Description BLOOD RIGHT ARM  Final   Special Requests   Final    BOTTLES DRAWN AEROBIC AND ANAEROBIC 10CC BOTH BOTTLES   Culture   Final           BLOOD  CULTURE RECEIVED NO GROWTH TO DATE CULTURE WILL BE HELD FOR 5 DAYS BEFORE ISSUING A FINAL NEGATIVE REPORT Performed at [facility]    Report Status PENDING  Incomplete  Culture, blood (routine x 2)     Status: None (Preliminary result)   Collection Time: 06/04/14  8:40 AM  Result Value Ref Range Status   Specimen Description BLOOD RIGHT ARM  Final   Special Requests BOTTLES DRAWN AEROBIC ONLY 5CC BLUE BOTTLE  Final   Culture   Final           BLOOD CULTURE RECEIVED NO GROWTH TO DATE CULTURE WILL BE HELD FOR 5 DAYS BEFORE ISSUING A FINAL NEGATIVE REPORT Performed at [facility]    Report Status PENDING  Incomplete     Studies: Ir Perc Cholecystostomy  06/09/2014   CLINICAL DATA:  Pericholecystic fluid, gallbladder sludge, nonvisualization on scintigraphy.  EXAM: PERCUTANEOUS CHOLECYSTOSTOMY TUBE PLACEMENT WITH ULTRASOUND AND FLUOROSCOPIC GUIDANCE:  FLUOROSCOPY TIME:  0.9 minutes, 80.7 mGy  TECHNIQUE: The procedure, risks (including but not limited to bleeding, infection, organ damage ), benefits,  and alternatives were explained to the patient. Questions regarding the procedure were encouraged and answered. The patient understands and consents to the procedure. Survey ultrasound of the abdomen was performed and an appropriate skin entry site was identified. Skin site was marked, prepped with Betadine, and draped in usual sterile fashion, and infiltrated locally with 1% lidocaine.  Intravenous Fentanyl and Versed were administered as conscious sedation during continuous cardiorespiratory monitoring by the radiology RN, with a total moderate sedation time of 18 minutes.  Under real-time ultrasound guidance, gallbladder was accessed using a 21 gauge trocar needle with a direct fundal approach in hopes of avoiding the known liver lesions in minimizing bleeding risk. The 018 guidewire advanced easily within the gallbladder lumen, confirmed under ultrasound. However, passage of the 6 [place] transitional dilator resulting in buckling of the wire at the fundal entry site, with significant risk of loss of percutaneous intraluminal access, especially considering the need to further dilate to 10 [place]. For this reason, a transhepatic approach was selected. A path was identified avoiding any macroscopic lesions. Under real-time ultrasound guidance, the gallbladder was accessed using the Transhepatic approach with a 21-gauge needle. Ultrasound image documentation was saved. Bile returned through the hub. Needle was exchanged over a 018 guidewire for transitional dilator which allowed placement of 035 J wire. Over this, a 10.2 French pigtail catheter was advanced and formed centrally in the gallbladder lumen. Small contrast injection confirmed appropriate position. Catheter secured externally with 0 Prolene suture and placed external drain bag. Patient tolerated the procedure well.  COMPLICATIONS: COMPLICATIONS none  IMPRESSION: 1. Technically successful percutaneous cholecystostomy tube placement with ultrasound and  fluoroscopic guidance.   Electronically Signed   By: Lucrezia Europe M.D.   On: 06/09/2014 10:35    Scheduled Meds: . acetaminophen  1,000 mg Oral TID  . amLODipine  5 mg Oral Daily  . atenolol  25 mg Oral Daily  . aztreonam  2 g Intravenous Q8H  . ferrous gluconate  324 mg Oral BID WC  . gabapentin  400 mg Oral TID  . heparin  5,000 Units Subcutaneous 3 times per day  . heparin lock flush  500 Units Intravenous Once  . insulin aspart  0-5 Units Subcutaneous QHS  . insulin aspart  0-9 Units Subcutaneous TID WC  . levofloxacin (LEVAQUIN) IV  750 mg Intravenous Q24H  . lidocaine      .  lip balm  1 application Topical BID  . polyethylene glycol  17 g Oral BID  . saccharomyces boulardii  250 mg Oral BID  . sodium chloride  3 mL Intravenous Q12H  . vancomycin  1,500 mg Intravenous Q12H   Continuous Infusions: . 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 If 7PM-7AM, please contact night-coverage at [email or web address], password [facility] 06/09/2014, 2:20 PM  LOS: 4 days

## 2014-06-09 NOTE — Procedures (Signed)
50F Per Cholecystostomy drain under [place] and fluoro No complication No blood loss. See complete dictation in [facility].

## 2014-06-09 NOTE — Progress Notes (Signed)
[facility] Progress Note     Subjective: Pt doing much better, pain now minimal.  She says this am she woke up and felt much improved.  No N/V, tolerating full liquids.  Having flatus and BM.  Perc chole drain with 29m output.  C.diff neg.  Objective: Vital signs in last 24 hours: Temp:  [97.4 F (36.3 C)] 97.4 F (36.3 C) (06/03 0421) Pulse Rate:  [51-56] 51 (06/03 1041) Resp:  [20-22] 20 (06/03 0421) BP: (127-139)/(60-86) 139/86 mmHg (06/03 0421) SpO2:  [93 %-97 %] 96 % (06/03 0421) Weight:  [148 kg (326 lb 4.5 oz)] 148 kg (326 lb 4.5 oz) (06/03 0421) Last BM Date: 06/08/14  Intake/Output from previous day: 06/02 0701 - 06/03 0700 In: 1500 [I.V.:200; IV Piggyback:1300] Out: 1315 [Urine:1250; Drains:65] Intake/Output this shift:    PE: Gen:  Alert, NAD, pleasant Abd: Obese soft, ND, minimal tenderness in epigastrium and RUQ (much improved), +BS, no HSM   Lab Results:   Recent Labs  06/09/14 0512 06/10/14 0714  WBC 13.8* 12.2*  HGB 10.5* 10.3*  HCT 34.5* 33.2*  PLT 406* 395   BMET  Recent Labs  06/09/14 0512 06/10/14 0714  NA 138 131*  K 3.4* 3.7  CL 104 99*  CO2 26 24  GLUCOSE 155* 181*  BUN 11 9  CREATININE 0.77 0.66  CALCIUM 8.2* 7.7*   PT/INR  Recent Labs  06/08/14 0630  LABPROT 17.6*  INR 1.43   CMP     Component Value Date/Time   NA 131* 06/10/2014 0714   NA 139 05/26/2014 1513   K 3.7 06/10/2014 0714   K 3.9 05/26/2014 1513   CL 99* 06/10/2014 0714   CO2 24 06/10/2014 0714   CO2 25 05/26/2014 1513   GLUCOSE 181* 06/10/2014 0714   GLUCOSE 252* 05/26/2014 1513   BUN 9 06/10/2014 0714   BUN 10.2 05/26/2014 1513   CREATININE 0.66 06/10/2014 0714   CREATININE 1.0 05/26/2014 1513   CALCIUM 7.7* 06/10/2014 0714   CALCIUM 8.8 05/26/2014 1513   PROT 6.7 06/10/2014 0714   PROT 7.8 05/26/2014 1513   ALBUMIN 1.7* 06/10/2014 0714   ALBUMIN 2.9* 05/26/2014 1513   AST 25 06/10/2014 0714   AST 38* 05/26/2014 1513   ALT 17  06/10/2014 0714   ALT 37 05/26/2014 1513   ALKPHOS 511* 06/10/2014 0714   ALKPHOS 564* 05/26/2014 1513   BILITOT 1.1 06/10/2014 0714   BILITOT 1.94* 05/26/2014 1513   GFRNONAA >60 06/10/2014 0714   GFRAA >60 06/10/2014 0714   Lipase     Component Value Date/Time   LIPASE 11* 06/04/2014 0232       Studies/Results: Ir Perc Cholecystostomy  06/09/2014   CLINICAL DATA:  Pericholecystic fluid, gallbladder sludge, nonvisualization on scintigraphy.  EXAM: PERCUTANEOUS CHOLECYSTOSTOMY TUBE PLACEMENT WITH ULTRASOUND AND FLUOROSCOPIC GUIDANCE:  FLUOROSCOPY TIME:  0.9 minutes, 80.7 mGy  TECHNIQUE: The procedure, risks (including but not limited to bleeding, infection, organ damage ), benefits, and alternatives were explained to the patient. Questions regarding the procedure were encouraged and answered. The patient understands and consents to the procedure. Survey ultrasound of the abdomen was performed and an appropriate skin entry site was identified. Skin site was marked, prepped with Betadine, and draped in usual sterile fashion, and infiltrated locally with 1% lidocaine.  Intravenous Fentanyl and Versed were administered as conscious sedation during continuous cardiorespiratory monitoring by the radiology RN, with a total moderate sedation time of 18 minutes.  Under real-time ultrasound guidance,  gallbladder was accessed using a 21 gauge trocar needle with a direct fundal approach in hopes of avoiding the known liver lesions in minimizing bleeding risk. The 018 guidewire advanced easily within the gallbladder lumen, confirmed under ultrasound. However, passage of the 6 [place] transitional dilator resulting in buckling of the wire at the fundal entry site, with significant risk of loss of percutaneous intraluminal access, especially considering the need to further dilate to 10 [place]. For this reason, a transhepatic approach was selected. A path was identified avoiding any macroscopic lesions. Under  real-time ultrasound guidance, the gallbladder was accessed using the Transhepatic approach with a 21-gauge needle. Ultrasound image documentation was saved. Bile returned through the hub. Needle was exchanged over a 018 guidewire for transitional dilator which allowed placement of 035 J wire. Over this, a 10.2 French pigtail catheter was advanced and formed centrally in the gallbladder lumen. Small contrast injection confirmed appropriate position. Catheter secured externally with 0 Prolene suture and placed external drain bag. Patient tolerated the procedure well.  COMPLICATIONS: COMPLICATIONS none  IMPRESSION: 1. Technically successful percutaneous cholecystostomy tube placement with ultrasound and fluoroscopic guidance.   Electronically Signed   By: Lucrezia Europe M.D.   On: 06/09/2014 10:35    Anti-infectives: Anti-infectives    Start     Dose/Rate Route Frequency Ordered Stop   06/09/14 1400  vancomycin (VANCOCIN) 1,500 mg in sodium chloride 0.9 % 500 mL IVPB     1,500 mg 250 mL/hr over 120 Minutes Intravenous Every 12 hours 06/09/14 1335     06/07/14 1500  vancomycin (VANCOCIN) 1,750 mg in sodium chloride 0.9 % 500 mL IVPB  Status:  Discontinued     1,750 mg 250 mL/hr over 120 Minutes Intravenous Every 24 hours 06/06/14 0930 06/09/14 1335   06/07/14 1000  levofloxacin (LEVAQUIN) IVPB 750 mg     750 mg 100 mL/hr over 90 Minutes Intravenous Every 24 hours 06/06/14 0930     06/06/14 1254  aztreonam (AZACTAM) 2 g in dextrose 5 % 50 mL IVPB     2 g 100 mL/hr over 30 Minutes Intravenous Every 8 hours 06/06/14 0930     06/06/14 0930  vancomycin (VANCOCIN) 2,500 mg in sodium chloride 0.9 % 500 mL IVPB     2,500 mg 250 mL/hr over 120 Minutes Intravenous  Once 06/06/14 0921 06/06/14 1731   06/06/14 0815  levofloxacin (LEVAQUIN) IVPB 750 mg     750 mg 100 mL/hr over 90 Minutes Intravenous  Once 06/06/14 0802 06/06/14 1246   06/06/14 0815  aztreonam (AZACTAM) 2 g in dextrose 5 % 50 mL IVPB     2  g 100 mL/hr over 30 Minutes Intravenous  Once 06/06/14 0802 06/06/14 1325   06/06/14 0815  vancomycin (VANCOCIN) IVPB 1000 mg/200 mL premix  Status:  Discontinued     1,000 mg 200 mL/hr over 60 Minutes Intravenous  Once 06/06/14 0802 06/06/14 0921   06/05/14 2200  metroNIDAZOLE (FLAGYL) IVPB 500 mg  Status:  Discontinued     500 mg 100 mL/hr over 60 Minutes Intravenous 3 times per day 06/05/14 1400 06/06/14 0802   06/05/14 2000  ciprofloxacin (CIPRO) IVPB 400 mg  Status:  Discontinued     400 mg 200 mL/hr over 60 Minutes Intravenous Every 12 hours 06/05/14 1410 06/06/14 0802   06/04/14 2200  metroNIDAZOLE (FLAGYL) tablet 500 mg  Status:  Discontinued     500 mg Oral 3 times per day 06/04/14 2124 06/05/14 1400   06/04/14 2130  ciprofloxacin (CIPRO) tablet 500 mg  Status:  Discontinued     500 mg Oral 2 times daily 06/04/14 2124 06/05/14 1400   06/04/14 1300  metroNIDAZOLE (FLAGYL) IVPB 500 mg  Status:  Discontinued     500 mg 100 mL/hr over 60 Minutes Intravenous 3 times per day 06/04/14 1218 06/04/14 2124   06/04/14 1300  ciprofloxacin (CIPRO) IVPB 400 mg  Status:  Discontinued     400 mg 200 mL/hr over 60 Minutes Intravenous Every 12 hours 06/04/14 1233 06/04/14 2124       Assessment/Plan Sepsis of unknown source on admission Leukocytosis - 12.2 Elevated Alk phos - 511 Hyperbilirubinemia -  Normalized 1.1 Cystic duct obstruction ?cholecystitis -Given metastatic carcinoid it is likely that her gallbladder will not be normal. There is no filling of the gallbladder on HIDA consistent with bilary obstruction/cholecystitis. Her WBC much improved.  -s/p IR perc drain, if in 6-8 weeks a tube cholangiogram is negative for obstruction, will consider removing the tube.  If still obstructed, may consider leaving tube in in-definitely secondary to possible carcinoid involvement in this area.   -Continue Antibiotics for minimum 4 more days, f/u cultures, follow clinically -Advance diet to  low fat diet as tolerated -Will follow up with Dr. Johney Maine in the office in 2-3 weeks  Metastatic carcinoid tumor -Dr. Burr Medico following Chronic diastolic CHF DVT Proph -Heparin SQ can be resumed     LOS: 5 days    Nat Christen 06/10/2014, 11:55 AM Pager: [phone]

## 2014-06-09 NOTE — Progress Notes (Signed)
ANTIBIOTIC CONSULT NOTE - FOLLOW UP  Pharmacy Consult for vancomycin, levaquin, aztreonam Indication: rule out sepsis, cholecystitis  Allergies  Allergen Reactions  . Penicillins Itching and Swelling    Patient Measurements: Height: 5\' 10"  (177.8 cm) Weight: (!) 323 lb 10.2 oz (146.8 kg) IBW/kg (Calculated) : 68.5  Vital Signs: Temp: 98.2 F (36.8 C) (06/02 0551) Temp Source: Oral (06/02 0551) BP: 156/100 mmHg (06/02 0904) Pulse Rate: 77 (06/02 0904) Intake/Output from previous day: 06/01 0701 - 06/02 0700 In: 1258.8 [I.V.:458.8; IV Piggyback:800] Out: 1150 [Urine:1150] Intake/Output from this shift:    Labs:  Recent Labs  06/07/14 0515 06/08/14 0630 06/09/14 0512  WBC 20.9* 16.3* 13.8*  HGB 10.5* 10.3* 10.5*  PLT 280 340 406*  CREATININE 1.19* 0.88 0.77   Estimated Creatinine Clearance: 110.5 mL/min (by C-G formula based on Cr of 0.77).  Recent Labs  06/09/14 0512  [facility] 19     Microbiology: Recent Results (from the past 720 hour(s))  Culture, blood (routine x 2)     Status: None (Preliminary result)   Collection Time: 06/04/14  8:29 AM  Result Value Ref Range Status   Specimen Description BLOOD RIGHT ARM  Final   Special Requests   Final    BOTTLES DRAWN AEROBIC AND ANAEROBIC 10CC BOTH BOTTLES   Culture   Final           BLOOD CULTURE RECEIVED NO GROWTH TO DATE CULTURE WILL BE HELD FOR 5 DAYS BEFORE ISSUING A FINAL NEGATIVE REPORT Performed at [facility]    Report Status PENDING  Incomplete  Culture, blood (routine x 2)     Status: None (Preliminary result)   Collection Time: 06/04/14  8:40 AM  Result Value Ref Range Status   Specimen Description BLOOD RIGHT ARM  Final   Special Requests BOTTLES DRAWN AEROBIC ONLY 5CC BLUE BOTTLE  Final   Culture   Final           BLOOD CULTURE RECEIVED NO GROWTH TO DATE CULTURE WILL BE HELD FOR 5 DAYS BEFORE ISSUING A FINAL NEGATIVE REPORT Performed at [facility]    Report Status  PENDING  Incomplete    Anti-infectives    Start     Dose/Rate Route Frequency Ordered Stop   06/07/14 1500  vancomycin (VANCOCIN) 1,750 mg in sodium chloride 0.9 % 500 mL IVPB     1,750 mg 250 mL/hr over 120 Minutes Intravenous Every 24 hours 06/06/14 0930     06/07/14 1000  levofloxacin (LEVAQUIN) IVPB 750 mg     750 mg 100 mL/hr over 90 Minutes Intravenous Every 24 hours 06/06/14 0930     06/06/14 1254  aztreonam (AZACTAM) 2 g in dextrose 5 % 50 mL IVPB     2 g 100 mL/hr over 30 Minutes Intravenous Every 8 hours 06/06/14 0930     06/06/14 0930  vancomycin (VANCOCIN) 2,500 mg in sodium chloride 0.9 % 500 mL IVPB     2,500 mg 250 mL/hr over 120 Minutes Intravenous  Once 06/06/14 0921 06/06/14 1731   06/06/14 0815  levofloxacin (LEVAQUIN) IVPB 750 mg     750 mg 100 mL/hr over 90 Minutes Intravenous  Once 06/06/14 0802 06/06/14 1246   06/06/14 0815  aztreonam (AZACTAM) 2 g in dextrose 5 % 50 mL IVPB     2 g 100 mL/hr over 30 Minutes Intravenous  Once 06/06/14 0802 06/06/14 1325   06/06/14 0815  vancomycin (VANCOCIN) IVPB 1000 mg/200 mL premix  Status:  Discontinued     1,000 mg 200 mL/hr over 60 Minutes Intravenous  Once 06/06/14 0802 06/06/14 0921   06/05/14 2200  metroNIDAZOLE (FLAGYL) IVPB 500 mg  Status:  Discontinued     500 mg 100 mL/hr over 60 Minutes Intravenous 3 times per day 06/05/14 1400 06/06/14 0802   06/05/14 2000  ciprofloxacin (CIPRO) IVPB 400 mg  Status:  Discontinued     400 mg 200 mL/hr over 60 Minutes Intravenous Every 12 hours 06/05/14 1410 06/06/14 0802   06/04/14 2200  metroNIDAZOLE (FLAGYL) tablet 500 mg  Status:  Discontinued     500 mg Oral 3 times per day 06/04/14 2124 06/05/14 1400   06/04/14 2130  ciprofloxacin (CIPRO) tablet 500 mg  Status:  Discontinued     500 mg Oral 2 times daily 06/04/14 2124 06/05/14 1400   06/04/14 1300  metroNIDAZOLE (FLAGYL) IVPB 500 mg  Status:  Discontinued     500 mg 100 mL/hr over 60 Minutes Intravenous 3 times per day  06/04/14 1218 06/04/14 2124   06/04/14 1300  ciprofloxacin (CIPRO) IVPB 400 mg  Status:  Discontinued     400 mg 200 mL/hr over 60 Minutes Intravenous Every 12 hours 06/04/14 1233 06/04/14 2124      Assessment: 64 y.o. obese female admitted 06/03/2014 for AMS with fever, leukocytosis, and lactic acidosis. Found also to have elevated transaminases and bilirubin. Thought initially due to known metastatic carcinoid tumor, but opted to treated with Cipro/Flagyl for possible cholecystitis.Patient continued to be febrile with Abx  broadened to Levaquin, Aztreonam, Vancomycin (noted PCN allergy with swelling) on 5/30. Per [facility] note on 6/2, plan is to treat for a minimum of 5 more days of abx.  5/28 >> Ciprofloxacin >> 5/30 5/28 >> Metronidazole >> 5/30 5/30 >>Levofloxacin >> 5/30 >> Aztreonam >> 5/30 >> Vancomycin >>  Tmax: afebrile  WBCs: elevated but trending down Renal: AKI (sepsis-related?) - resolved;  CrCl > 90 CG; 72 N HIDA: suggest acute cholecystitis  5/28 blood: NGTD 6/2 body fluid: pending  Dose changes/levels: 6/2 AM VRm = 19 on 1750 q24 (~14 hrs after last dose)   Goal of Therapy:  Vancomycin trough level 15-20 mcg/ml  Plan:  - will change vancomycin to 1500 mg IV q12h - Will obtain vancomycin level at steady state to assess new regimen - continue levaquin 750 mg IV q24h - continue aztreonam 2gm IV q8h  Gilman Olazabal P 06/09/2014,1:26 PM

## 2014-06-09 NOTE — Progress Notes (Signed)
[facility] Progress Note     Subjective: Perc chole drain placed Still has abd pain in RUQ, RLQ and epigastrium.   No N/V Appetite fair    Objective: Vital signs in last 24 hours: Temp:  [97.9 F (36.6 C)-98.4 F (36.9 C)] 98.2 F (36.8 C) (06/02 0551) Pulse Rate:  [58-84] 77 (06/02 0904) Resp:  [18-24] 24 (06/02 0904) BP: (135-157)/(46-100) 156/100 mmHg (06/02 0904) SpO2:  [94 %-100 %] 97 % (06/02 0904) Weight:  [146.8 kg (323 lb 10.2 oz)] 146.8 kg (323 lb 10.2 oz) (06/02 0551) Last BM Date: 06/08/14  Intake/Output from previous day: 06/01 0701 - 06/02 0700 In: 1258.8 [I.V.:458.8; IV Piggyback:800] Out: 1150 [Urine:1150] Intake/Output this shift:    PE: General: Pt awake/alert/oriented x4 in mild acute distress Eyes: PERRL, normal EOM. Sclera nonicteric Neuro: CN II-XII intact w/o focal sensory/motor deficits. Lymph: No head/neck/groin lymphadenopathy Psych:  No delerium/psychosis/paranoia.  Follows commands but tangental HENT: Normocephalic, Mucus membranes moist.  No thrush Neck: Supple, No tracheal deviation Chest: No pain.  Good respiratory excursion. CV:  Pulses intact.  Regular rhythm MS: Normal AROM mjr joints.  No obvious deformity Abdomen: Soft, Nondistended.  Morbidly obese.  Diffuse mild TTP.  No incarcerated hernias.  RUQ drain w thick black bile Ext:  SCDs BLE.  No significant edema.  No cyanosis Skin: No petechiae / purpura    Lab Results:   Recent Labs  06/08/14 0630 06/09/14 0512  WBC 16.3* 13.8*  HGB 10.3* 10.5*  HCT 33.3* 34.5*  PLT 340 406*   BMET  Recent Labs  06/08/14 0630 06/09/14 0512  NA 138 138  K 3.4* 3.4*  CL 104 104  CO2 27 26  GLUCOSE 109* 155*  BUN 14 11  CREATININE 0.88 0.77  CALCIUM 8.0* 8.2*   PT/INR  Recent Labs  06/08/14 0630  LABPROT 17.6*  INR 1.43   CMP     Component Value Date/Time   NA 138 06/09/2014 0512   NA 139 05/26/2014 1513   K 3.4* 06/09/2014 0512   K 3.9 05/26/2014  1513   CL 104 06/09/2014 0512   CO2 26 06/09/2014 0512   CO2 25 05/26/2014 1513   GLUCOSE 155* 06/09/2014 0512   GLUCOSE 252* 05/26/2014 1513   BUN 11 06/09/2014 0512   BUN 10.2 05/26/2014 1513   CREATININE 0.77 06/09/2014 0512   CREATININE 1.0 05/26/2014 1513   CALCIUM 8.2* 06/09/2014 0512   CALCIUM 8.8 05/26/2014 1513   PROT 7.0 06/09/2014 0512   PROT 7.8 05/26/2014 1513   ALBUMIN 2.0* 06/09/2014 0512   ALBUMIN 2.9* 05/26/2014 1513   AST 23 06/09/2014 0512   AST 38* 05/26/2014 1513   ALT 16 06/09/2014 0512   ALT 37 05/26/2014 1513   ALKPHOS 516* 06/09/2014 0512   ALKPHOS 564* 05/26/2014 1513   BILITOT 1.1 06/09/2014 0512   BILITOT 1.94* 05/26/2014 1513   GFRNONAA >60 06/09/2014 0512   GFRAA >60 06/09/2014 0512   Lipase     Component Value Date/Time   LIPASE 11* 06/04/2014 0232       Studies/Results: Nm Hepatobiliary Liver Func  06/07/2014   CLINICAL DATA:  Right upper quadrant pain. Cholecystitis. Gallbladder sludge and wall thickening seen on recent ultrasound. Multiple liver masses. Carcinoid tumor.  EXAM: NUCLEAR MEDICINE HEPATOBILIARY IMAGING  TECHNIQUE: Sequential images of the abdomen were obtained out to 60 minutes following intravenous administration of radiopharmaceutical. Because of lack of gallbladder activity, imaging was continued for an additional 60 minutes following  administration of 5.9 mg morphine IV and additional 2.0 mCi technetium 99 M Choletec.  RADIOPHARMACEUTICALS:  9.6 Tc-66mCholetec IV  COMPARISON:  Abdomen ultrasound on 06/05/2014 and abdomen CT on 06/01/2014  FINDINGS: Prompt radiopharmaceutical uptake by the liver is seen. The liver shows multiple photopenic masslike areas, consistent with numerous liver masses as seen on recent CT, suspicious for liver metastases.  Biliary excretion of activity is delayed indicating hepatocellular dysfunction. Biliary activity reaches small bowel initially on the 35 minutes image.  No gallbladder activity is  visualized prior to morphine administration, as well as during additional 60 minutes of imaging following administration of intravenous morphine.  IMPRESSION: Absence of gallbladder activity, suggesting acute cholecystitis. Note that false positive results can occur in setting of setting of prolonged NPO status or gallbladder sludge.  No evidence of biliary obstruction.  Diffuse photopenic lesions throughout the liver, consistent liver metastases.   Electronically Signed   By: JEarle GellM.D.   On: 06/07/2014 14:09    Anti-infectives: Anti-infectives    Start     Dose/Rate Route Frequency Ordered Stop   06/07/14 1500  vancomycin (VANCOCIN) 1,750 mg in sodium chloride 0.9 % 500 mL IVPB     1,750 mg 250 mL/hr over 120 Minutes Intravenous Every 24 hours 06/06/14 0930     06/07/14 1000  levofloxacin (LEVAQUIN) IVPB 750 mg     750 mg 100 mL/hr over 90 Minutes Intravenous Every 24 hours 06/06/14 0930     06/06/14 1254  aztreonam (AZACTAM) 2 g in dextrose 5 % 50 mL IVPB     2 g 100 mL/hr over 30 Minutes Intravenous Every 8 hours 06/06/14 0930     06/06/14 0930  vancomycin (VANCOCIN) 2,500 mg in sodium chloride 0.9 % 500 mL IVPB     2,500 mg 250 mL/hr over 120 Minutes Intravenous  Once 06/06/14 0921 06/06/14 1731   06/06/14 0815  levofloxacin (LEVAQUIN) IVPB 750 mg     750 mg 100 mL/hr over 90 Minutes Intravenous  Once 06/06/14 0802 06/06/14 1246   06/06/14 0815  aztreonam (AZACTAM) 2 g in dextrose 5 % 50 mL IVPB     2 g 100 mL/hr over 30 Minutes Intravenous  Once 06/06/14 0802 06/06/14 1325   06/06/14 0815  vancomycin (VANCOCIN) IVPB 1000 mg/200 mL premix  Status:  Discontinued     1,000 mg 200 mL/hr over 60 Minutes Intravenous  Once 06/06/14 0802 06/06/14 0921   06/05/14 2200  metroNIDAZOLE (FLAGYL) IVPB 500 mg  Status:  Discontinued     500 mg 100 mL/hr over 60 Minutes Intravenous 3 times per day 06/05/14 1400 06/06/14 0802   06/05/14 2000  ciprofloxacin (CIPRO) IVPB 400 mg  Status:   Discontinued     400 mg 200 mL/hr over 60 Minutes Intravenous Every 12 hours 06/05/14 1410 06/06/14 0802   06/04/14 2200  metroNIDAZOLE (FLAGYL) tablet 500 mg  Status:  Discontinued     500 mg Oral 3 times per day 06/04/14 2124 06/05/14 1400   06/04/14 2130  ciprofloxacin (CIPRO) tablet 500 mg  Status:  Discontinued     500 mg Oral 2 times daily 06/04/14 2124 06/05/14 1400   06/04/14 1300  metroNIDAZOLE (FLAGYL) IVPB 500 mg  Status:  Discontinued     500 mg 100 mL/hr over 60 Minutes Intravenous 3 times per day 06/04/14 1218 06/04/14 2124   06/04/14 1300  ciprofloxacin (CIPRO) IVPB 400 mg  Status:  Discontinued     400 mg 200 mL/hr over  60 Minutes Intravenous Every 12 hours 06/04/14 1233 06/04/14 2124       Assessment/Plan Sepsis of unknown source on admission Leukocytosis  Elevated Alk phos - mildly up but had procedure Hyperbilirubinemia Cystic duct obstruction ?cholecystitis  -Given metastatic carcinoid it is likely that her gallbladder will not be normal. There is no filling of the gallbladder on HIDA consistent with bilary obstruction/cholecystitis. Her WBC remains high, but is trending down. LFT's are high at baseline, but improving.   -Due to chronic medical conditions would not recommend lap chole. Palliate with perc chole tube as opposed to cholecystectomy at this time.  Cholangiogram in 6 weeks.  If no obstruction & no stones, d/c tube & f/u PRN.  If still w obstruction, consider lap chole but risks moderate   Continue IV ABx x 5 more days minimum.  F/u cultures.  Follow clinically Metastatic carcinoid tumor -Dr. Burr Medico following Chronic diastolic CHF DVT Proph -Heparin SQ needs to be held prior to perc chole    LOS: 4 days    Michaelene Dutan C. 06/09/2014, 10:08 AM Pager: [phone]

## 2014-06-10 DIAGNOSIS — G9340 Encephalopathy, unspecified: Secondary | ICD-10-CM

## 2014-06-10 LAB — CBC
HEMATOCRIT: 33.2 % — AB (ref 36.0–46.0)
Hemoglobin: 10.3 g/dL — ABNORMAL LOW (ref 12.0–15.0)
MCH: 28.2 pg (ref 26.0–34.0)
MCHC: 31 g/dL (ref 30.0–36.0)
MCV: 91 fL (ref 78.0–100.0)
Platelets: 395 10*3/uL (ref 150–400)
RBC: 3.65 MIL/uL — AB (ref 3.87–5.11)
RDW: 13.7 % (ref 11.5–15.5)
WBC: 12.2 10*3/uL — ABNORMAL HIGH (ref 4.0–10.5)

## 2014-06-10 LAB — COMPREHENSIVE METABOLIC PANEL
ALK PHOS: 511 U/L — AB (ref 38–126)
ALT: 17 U/L (ref 14–54)
AST: 25 U/L (ref 15–41)
Albumin: 1.7 g/dL — ABNORMAL LOW (ref 3.5–5.0)
Anion gap: 8 (ref 5–15)
BUN: 9 mg/dL (ref 6–20)
CHLORIDE: 99 mmol/L — AB (ref 101–111)
CO2: 24 mmol/L (ref 22–32)
Calcium: 7.7 mg/dL — ABNORMAL LOW (ref 8.9–10.3)
Creatinine, Ser: 0.66 mg/dL (ref 0.44–1.00)
GFR calc Af Amer: >60 mL/min (ref >60)
GFR calc non Af Amer: >60 mL/min (ref >60)
Glucose, Bld: 181 mg/dL — ABNORMAL HIGH (ref 65–99)
POTASSIUM: 3.7 mmol/L (ref 3.5–5.1)
SODIUM: 131 mmol/L — AB (ref 135–145)
TOTAL PROTEIN: 6.7 g/dL (ref 6.5–8.1)
Total Bilirubin: 1.1 mg/dL (ref 0.3–1.2)

## 2014-06-10 LAB — CULTURE, BLOOD (ROUTINE X 2)
CULTURE: NO GROWTH
Culture: NO GROWTH

## 2014-06-10 LAB — CLOSTRIDIUM DIFFICILE BY PCR: Toxigenic C. Difficile by PCR: NEGATIVE

## 2014-06-10 LAB — GLUCOSE, CAPILLARY
GLUCOSE-CAPILLARY: 187 mg/dL — AB (ref 65–99)
GLUCOSE-CAPILLARY: 162 mg/dL — AB (ref 65–99)
GLUCOSE-CAPILLARY: 168 mg/dL — AB (ref 65–99)
Glucose-Capillary: 160 mg/dL — ABNORMAL HIGH (ref 65–99)

## 2014-06-10 MED ORDER — SPIRONOLACTONE 25 MG PO TABS
25.0000 mg | ORAL_TABLET | Freq: Every day | ORAL | Status: DC
  Administered 2014-06-10 – 2014-06-11 (×2): 25 mg via ORAL
  Filled 2014-06-10 (×2): qty 1

## 2014-06-10 MED ORDER — FUROSEMIDE 10 MG/ML IJ SOLN
40.0000 mg | Freq: Every day | INTRAMUSCULAR | Status: DC
  Administered 2014-06-10 – 2014-06-11 (×2): 40 mg via INTRAVENOUS
  Filled 2014-06-10 (×2): qty 4

## 2014-06-10 MED ORDER — ALTEPLASE 2 MG IJ SOLR
2.0000 mg | Freq: Once | INTRAMUSCULAR | Status: AC
  Administered 2014-06-10: 2 mg
  Filled 2014-06-10: qty 2

## 2014-06-10 NOTE — Progress Notes (Signed)
Patient ID: Yesenia Lyons, female   DOB: 1950-04-05, 64 y.o.   MRN: [id number]    Referring Physician(s): CCS  Subjective:  Pt feeling better today; still a little sore at GB drain site; denies N/V  Allergies: Penicillins  Medications: Prior to Admission medications   Medication Sig Start Date End Date Taking? Authorizing Provider  albuterol (PROVENTIL HFA;VENTOLIN HFA) 108 (90 BASE) MCG/ACT inhaler Inhale 2 puffs into the lungs every 6 (six) hours as needed for wheezing (wheezing).    Yes Historical Provider, MD  amLODipine-benazepril (LOTREL) 5-10 MG per capsule Take 1 capsule by mouth daily.   Yes Historical Provider, MD  atenolol (TENORMIN) 25 MG tablet Take 25 mg by mouth daily.    Yes Historical Provider, MD  beclomethasone (QVAR) 80 MCG/ACT inhaler Inhale 1 puff into the lungs as needed (sob). breathing   Yes Historical Provider, MD  feeding supplement, ENSURE COMPLETE, (ENSURE COMPLETE) LIQD Take 237 mLs by mouth 2 (two) times daily between meals. Patient taking differently: Take 237 mLs by mouth daily as needed (nutrition).  04/01/13  Yes Nishant Dhungel, MD  furosemide (LASIX) 40 MG tablet Take 1 tablet (40 mg total) by mouth daily. 10/29/11  Yes Sheryle Hail, NP  gabapentin (NEURONTIN) 400 MG capsule Take 400 mg by mouth 3 (three) times daily.   Yes Historical Provider, MD  insulin aspart (NOVOLOG) 100 UNIT/ML injection Inject 40-50 Units into the skin 3 (three) times daily before meals. Inject 50 units at breakfast and 40 units at  lunch and 40 units at dinner. Pt on sliding scale   Yes Historical Provider, MD  insulin glargine (LANTUS) 100 UNIT/ML injection Inject 50 Units into the skin 2 (two) times daily. In the morning and at bedtime.   Yes Historical Provider, MD  ondansetron (ZOFRAN ODT) 4 MG disintegrating tablet Take 1 tablet (4 mg total) by mouth every 8 (eight) hours as needed for nausea or vomiting. 08/28/13  Yes Velvet Bathe, MD  oxyCODONE-acetaminophen (PERCOCET)  10-325 MG per tablet Take 1 tablet by mouth every 8 (eight) hours as needed for pain. 03/03/14  Yes Truitt Merle, MD  spironolactone (ALDACTONE) 25 MG tablet Take 25 mg by mouth daily.   Yes Historical Provider, MD  vitamin C (ASCORBIC ACID) 500 MG tablet Take 1 tablet (500 mg total) by mouth daily. 09/02/13  Yes Aasim Lona Kettle, MD  ferrous gluconate (FERGON) 324 MG tablet Take 324 mg by mouth 2 (two) times daily with a meal.    Historical Provider, MD  Ginger, Zingiber officinalis, (GINGER ROOT) 550 MG CAPS Take 1 capsule by mouth daily as needed (nutrition).     Historical Provider, MD  Zinc 50 MG TABS Take 1 tablet (50 mg total) by mouth daily. Patient not taking: Reported on 06/03/2014 09/02/13   Bernadene Bell, MD     Vital Signs: BP 147/94 mmHg  Pulse 69  Temp(Src) 97.8 F (36.6 C) (Oral)  Resp 22  Ht 5\' 10"  (1.778 m)  Wt 326 lb 4.5 oz (148 kg)  BMI 46.82 kg/m2  SpO2 97%  Physical Exam pt awake/alert; GB drain intact, insertion site ok, mildly tender, output 65 cc's , cx;s pend  Imaging: Nm Hepatobiliary Liver Func  06/07/2014   CLINICAL DATA:  Right upper quadrant pain. Cholecystitis. Gallbladder sludge and wall thickening seen on recent ultrasound. Multiple liver masses. Carcinoid tumor.  EXAM: NUCLEAR MEDICINE HEPATOBILIARY IMAGING  TECHNIQUE: Sequential images of the abdomen were obtained out to 60 minutes following intravenous administration of radiopharmaceutical. Because  of lack of gallbladder activity, imaging was continued for an additional 60 minutes following administration of 5.9 mg morphine IV and additional 2.0 mCi technetium 99 M Choletec.  RADIOPHARMACEUTICALS:  9.6 Tc-64m Choletec IV  COMPARISON:  Abdomen ultrasound on 06/05/2014 and abdomen CT on 06/01/2014  FINDINGS: Prompt radiopharmaceutical uptake by the liver is seen. The liver shows multiple photopenic masslike areas, consistent with numerous liver masses as seen on recent CT, suspicious for liver metastases.  Biliary  excretion of activity is delayed indicating hepatocellular dysfunction. Biliary activity reaches small bowel initially on the 35 minutes image.  No gallbladder activity is visualized prior to morphine administration, as well as during additional 60 minutes of imaging following administration of intravenous morphine.  IMPRESSION: Absence of gallbladder activity, suggesting acute cholecystitis. Note that false positive results can occur in setting of setting of prolonged NPO status or gallbladder sludge.  No evidence of biliary obstruction.  Diffuse photopenic lesions throughout the liver, consistent liver metastases.   Electronically Signed   By: Earle Gell M.D.   On: 06/07/2014 14:09   Ir Perc Cholecystostomy  06/09/2014   CLINICAL DATA:  Pericholecystic fluid, gallbladder sludge, nonvisualization on scintigraphy.  EXAM: PERCUTANEOUS CHOLECYSTOSTOMY TUBE PLACEMENT WITH ULTRASOUND AND FLUOROSCOPIC GUIDANCE:  FLUOROSCOPY TIME:  0.9 minutes, 80.7 mGy  TECHNIQUE: The procedure, risks (including but not limited to bleeding, infection, organ damage ), benefits, and alternatives were explained to the patient. Questions regarding the procedure were encouraged and answered. The patient understands and consents to the procedure. Survey ultrasound of the abdomen was performed and an appropriate skin entry site was identified. Skin site was marked, prepped with Betadine, and draped in usual sterile fashion, and infiltrated locally with 1% lidocaine.  Intravenous Fentanyl and Versed were administered as conscious sedation during continuous cardiorespiratory monitoring by the radiology RN, with a total moderate sedation time of 18 minutes.  Under real-time ultrasound guidance, gallbladder was accessed using a 21 gauge trocar needle with a direct fundal approach in hopes of avoiding the known liver lesions in minimizing bleeding risk. The 018 guidewire advanced easily within the gallbladder lumen, confirmed under ultrasound.  However, passage of the 6 [place] transitional dilator resulting in buckling of the wire at the fundal entry site, with significant risk of loss of percutaneous intraluminal access, especially considering the need to further dilate to 10 [place]. For this reason, a transhepatic approach was selected. A path was identified avoiding any macroscopic lesions. Under real-time ultrasound guidance, the gallbladder was accessed using the Transhepatic approach with a 21-gauge needle. Ultrasound image documentation was saved. Bile returned through the hub. Needle was exchanged over a 018 guidewire for transitional dilator which allowed placement of 035 J wire. Over this, a 10.2 French pigtail catheter was advanced and formed centrally in the gallbladder lumen. Small contrast injection confirmed appropriate position. Catheter secured externally with 0 Prolene suture and placed external drain bag. Patient tolerated the procedure well.  COMPLICATIONS: COMPLICATIONS none  IMPRESSION: 1. Technically successful percutaneous cholecystostomy tube placement with ultrasound and fluoroscopic guidance.   Electronically Signed   By: Lucrezia Europe M.D.   On: 06/09/2014 10:35    Labs:  CBC:  Recent Labs  06/07/14 0515 06/08/14 0630 06/09/14 0512 06/10/14 0714  WBC 20.9* 16.3* 13.8* 12.2*  HGB 10.5* 10.3* 10.5* 10.3*  HCT 33.2* 33.3* 34.5* 33.2*  PLT 280 340 406* 395    COAGS:  Recent Labs  08/24/13 0845 06/04/14 0232 06/06/14 0841 06/08/14 0630  INR 1.34 1.38 1.43 1.43  APTT  --   --  36  --     BMP:  Recent Labs  06/07/14 0515 06/08/14 0630 06/09/14 0512 06/10/14 0714  NA 132* 138 138 131*  K 3.2* 3.4* 3.4* 3.7  CL 100* 104 104 99*  CO2 25 27 26 24   GLUCOSE 58* 109* 155* 181*  BUN 20 14 11 9   CALCIUM 7.6* 8.0* 8.2* 7.7*  CREATININE 1.19* 0.88 0.77 0.66  GFRNONAA 47* >60 >60 >60  GFRAA 54* >60 >60 >60    LIVER FUNCTION TESTS:  Recent Labs  06/07/14 0515 06/08/14 0630 06/09/14 0512  06/10/14 0714  BILITOT 2.0* 1.2 1.1 1.1  AST 29 24 23 25   ALT 17 16 16 17   ALKPHOS 298* 353* 516* 511*  PROT 6.9 7.0 7.0 6.7  ALBUMIN 2.2* 2.0* 2.0* 1.7*    Assessment and Plan:  Cholecystitis, s/p perc GB drain 6/2; afebrile; WBC 12.2(13.8), bile cx's pend, c diff neg., cont drain for at least 4-6 weeks unless GB removed in interim; additional plans as per CCS; upon d/c rec once daily flushing of drain with 5-10 cc's sterile NS, recording of output and dressing change every other day; contact [phone] or [phone] with any questions Signed: D. Rowe Robert 06/10/2014, 4:23 PM   I spent a total of 15 minutes in face to face in clinical consultation/evaluation, greater than 50% of which was counseling/coordinating care for GB drain

## 2014-06-10 NOTE — Progress Notes (Signed)
Physical Therapy Treatment Patient Details Name: Nysa Sarin MRN: [id number] DOB: [birth year] Today's Date: 06/10/2014    History of Present Illness 64 yo female admitted with sepsis. Hx of obesity, HTN, DM, CHF, metastatic carcinoid tumor.    PT Comments    Progressing slowly with mobility. Discussed d/c plan-pt states she will be returning home.   Follow Up Recommendations  Home health PT;Supervision for mobility/OOB (pt states she is returning home. [place] may be beneificial)     Equipment Recommendations  None recommended by PT    Recommendations for Other Services       Precautions / Restrictions Precautions Precautions: Fall Restrictions Weight Bearing Restrictions: No    Mobility  Bed Mobility Overal bed mobility: Needs Assistance Bed Mobility: Sit to Supine       Sit to supine: Min assist   General bed mobility comments: Assist for LEs back onto bed. Increased time.   Transfers Overall transfer level: Needs assistance Equipment used: Rolling walker (2 wheeled) Transfers: Sit to/from Stand Sit to Stand: Min guard Stand pivot transfers: Min guard          Ambulation/Gait Ambulation/Gait assistance: Min guard Ambulation Distance (Feet): 60 Feet Assistive device: Rolling walker (2 wheeled)       General Gait Details: close guard for safety. Dyspnea 2/4. Fatigues fairly easily.    Stairs            Wheelchair Mobility    Modified Rankin (Stroke Patients Only)       Balance                                    Cognition Arousal/Alertness: Awake/alert Behavior During Therapy: WFL for tasks assessed/performed Overall Cognitive Status: Within Functional Limits for tasks assessed                      Exercises      General Comments        Pertinent Vitals/Pain Pain Assessment: Faces Faces Pain Scale: Hurts little more Pain Location: R side abdomen Pain Descriptors / Indicators: Sore Pain  Intervention(s): Monitored during session    Home Living                      Prior Function            PT Goals (current goals can now be found in the care plan section) Progress towards PT goals: Progressing toward goals    Frequency  Min 3X/week    PT Plan Current plan remains appropriate    Co-evaluation             End of Session   Activity Tolerance: Patient limited by fatigue Patient left: in bed;with call bell/phone within reach     Time: 3094-0768 PT Time Calculation (min) (ACUTE ONLY): 15 min  Charges:  $Gait Training: 8-22 mins                    G Codes:      Weston Anna, MPT Pager: [phone]

## 2014-06-10 NOTE — Progress Notes (Signed)
OT Cancellation Note  Patient Details Name: Tamzin Bertling MRN: [id number] DOB: 08-05-1950   Cancelled Treatment:    Reason Eval/Treat Not Completed: Other (comment).  Pt had just gotten back to bed when I checked on her this afternoon and was fatiqued.  Will try to return on Monday.  Dustan Hyams 06/10/2014, 4:12 PM  Lesle Chris, OTR/L [phone] 06/10/2014

## 2014-06-10 NOTE — Progress Notes (Addendum)
[facility] PROGRESS NOTE  Yesenia Lyons FMB:[id number] DOB: 1950-02-24 DOA: 06/03/2014 PCP: Isaias Cowman, PA-C   Off Service Summary: [phone] with metastatic carcinoid tumor followed by Dr. Burr Medico who presented initially with altered mental status, found to be floridly septic with LFT's more elevated than baseline. Abd [place] was suspicious for acute cholecystitis and the patient was started on cipro/flagyl given PCN allergy. Surgery was called, who recommended HIDA scan. The patient's WBC continued to worsen and abx were broadened to vanc, levaquin, and aztreonam. WBC has since improved. Follow up HIDA was ultimately obtained which also suggested colicystitis and Surgery was formally consulted with recommendations for IR guided perc drain which she received on 6/2  Assessment/Plan:  Sepsis secondary to acute cholecystitis -  CT scan on 5/26 was unremarkable for biliary obstruction -  Abdominal ultrasound demonstrated sludge within the gallbladder and gallbladder wall thickening with trace fluid surrounding the gallbladder -  HIDA scan demonstrated no evidence of contrast within the gallbladder consistent with acute cholecystitis -  General surgery was consulted who recommended against surgical resection at this time -  Interventional radiology performed cholecystostomy drain placement on 6/2 without complication -  Transaminitis has trended down -  Continue vancomycin, aztreonam, and levofloxacin pending culture data -  Follow-up biliary culture -  F/u Dr. Johney Maine in 2-3 weeks.  Arrange for cholangiogram at 6 weeks and if no obstruction or stones, remove drain.  If obstructive or positive for stones, consider lap chole vs. Indefinite biliary drain -  Drain should remain in for 4-6 weeks unless gallbladder removed in the interim:  Once daily flushes with 5-10 mL saline and recording output every other day.  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 Continue octreotide.  Pseudohyponatremia secondary to hyperglycemia, resolved. Current hyponatremia may be secondary to dehydration.  Diabetes mellitus type 2 with stable blood sugars for the last 24 hours -  Continue Lantus 10 units sliding scale insulin  Hypertension, blood pressure is mildly elevated likely secondary to pain -  Continue pain medication and blood pressure medications  Chronic diastolic heart failure with increased peripheral edema on exam -  Resume diuretic  Code Status: Full Family Communication: Pt in room Disposition Plan:   Pending the results of her cholecystostomy fluid culture, able to tolerate diet, anticipate home possibly tomorrow   Consultants:  General surgery  IR  Procedures:  CT abd/pelvis  Abd [place]  HIDA  Antibiotics:  Ciprofloxacin 5/28>>>5/30  Flagyl 5/28>>>5/30  Aztreonam 5/30>>>  Levaquin 5/30>>>  Vancomycin 5/30>>>  HPI/Subjective: Diarrhea has resolved. She continues to have pain in the right upper quadrant. Denies nausea and would like to eat.  Objective: Filed Vitals:   06/09/14 2154 06/10/14 0421 06/10/14 1041 06/10/14 1427  BP: 127/60 139/86  147/94  Pulse: 56 53 51 69  Temp: 97.4 F (36.3 C) 97.4 F (36.3 C)  97.8 F (36.6 C)  TempSrc: Oral Oral  Oral  Resp: 20 20  22   Height:      Weight:  148 kg (326 lb 4.5 oz)    SpO2: 97% 96%  97%    Intake/Output Summary (Last 24 hours) at 06/10/14 1650 Last data filed at 06/10/14 0615  Gross per 24 hour  Intake   1300 ml  Output   1065 ml  Net    235 ml   Filed Weights   06/08/14  0644 06/09/14 0551 06/10/14 0421  Weight: 147.1 kg (324 lb 4.8 oz) 146.8 kg (323 lb 10.2 oz) 148 kg (326 lb 4.5 oz)    Exam:   General:  Obese F, laying in bed in nad, oriented  Cardiovascular: regular, s1, s2  Respiratory: normal resp effort, no wheezng  Abdomen:   NABS,  soft, ND, TTP in the RUQ, epigastric area, umbilical area with some guarding, no rebounding.  Less TTP but present in other quadrants.   tender over epigastric region. The drain contains much clear brown fluid  Musculoskeletal: Perfused, no clubbing, no cyanosis, 1+ bilateral pitting LEE  Data Reviewed: Basic Metabolic Panel:  Recent Labs Lab 06/06/14 0330 06/07/14 0515 06/08/14 0630 06/09/14 0512 06/10/14 0714  NA 136 132* 138 138 131*  K 3.7 3.2* 3.4* 3.4* 3.7  CL 98* 100* 104 104 99*  CO2 26 25 27 26 24   GLUCOSE 75 58* 109* 155* 181*  BUN 24* 20 14 11 9   CREATININE 1.48* 1.19* 0.88 0.77 0.66  CALCIUM 8.3* 7.6* 8.0* 8.2* 7.7*   Liver Function Tests:  Recent Labs Lab 06/06/14 0330 06/07/14 0515 06/08/14 0630 06/09/14 0512 06/10/14 0714  AST 42* 29 24 23 25   ALT 25 17 16 16 17   ALKPHOS 380* 298* 353* 516* 511*  BILITOT 2.9* 2.0* 1.2 1.1 1.1  PROT 7.9 6.9 7.0 7.0 6.7  ALBUMIN 2.8* 2.2* 2.0* 2.0* 1.7*    Recent Labs Lab 06/04/14 0232  LIPASE 11*    Recent Labs Lab 06/04/14 0230  AMMONIA 29   CBC:  Recent Labs Lab 06/03/14 2240 06/04/14 0232  06/06/14 0330 06/07/14 0515 06/08/14 0630 06/09/14 0512 06/10/14 0714  WBC 20.0* 22.1*  < > 28.9* 20.9* 16.3* 13.8* 12.2*  NEUTROABS 15.4* 16.3*  --   --  16.3* 12.6*  --   --   HGB 13.2 12.1  < > 12.0 10.5* 10.3* 10.5* 10.3*  HCT 41.1 37.9  < > 37.9 33.2* 33.3* 34.5* 33.2*  MCV 91.5 91.8  < > 90.5 90.7 92.8 92.7 91.0  PLT 295 289  < > 324 280 340 406* 395  < > = values in this interval not displayed. Cardiac Enzymes:  Recent Labs Lab 06/03/14 2240  TROPONINI <0.03   BNP (last 3 results) No results for input(s): BNP in the last 8760 hours.  ProBNP (last 3 results) No results for input(s): PROBNP in the last 8760 hours.  CBG:  Recent Labs Lab 06/09/14 1631 06/09/14 2152 06/10/14 0740 06/10/14 1132 06/10/14 1625  GLUCAP 173* 179* 160* 187* 162*    Recent Results (from the past 240 hour(s))   Culture, blood (routine x 2)     Status: None   Collection Time: 06/04/14  8:29 AM  Result Value Ref Range Status   Specimen Description BLOOD RIGHT ARM  Final   Special Requests   Final    BOTTLES DRAWN AEROBIC AND ANAEROBIC 10CC BOTH BOTTLES   Culture   Final    NO GROWTH 5 DAYS Performed at [facility]    Report Status 06/10/2014 FINAL  Final  Culture, blood (routine x 2)     Status: None   Collection Time: 06/04/14  8:40 AM  Result Value Ref Range Status   Specimen Description BLOOD RIGHT ARM  Final   Special Requests BOTTLES DRAWN AEROBIC ONLY 5CC BLUE BOTTLE  Final   Culture   Final    NO GROWTH 5 DAYS Performed at [facility]  Report Status 06/10/2014 FINAL  Final  Body fluid culture     Status: None (Preliminary result)   Collection Time: 06/09/14  9:00 AM  Result Value Ref Range Status   Specimen Description BILE  Final   Special Requests NONE  Final   Gram Stain   Final    NO WBC SEEN NO ORGANISMS SEEN Performed at [facility]    Culture PENDING  Incomplete   Report Status PENDING  Incomplete  Clostridium Difficile by PCR     Status: None   Collection Time: 06/10/14  8:01 AM  Result Value Ref Range Status   C difficile by pcr NEGATIVE NEGATIVE Final     Studies: Ir Perc Cholecystostomy  06/09/2014   CLINICAL DATA:  Pericholecystic fluid, gallbladder sludge, nonvisualization on scintigraphy.  EXAM: PERCUTANEOUS CHOLECYSTOSTOMY TUBE PLACEMENT WITH ULTRASOUND AND FLUOROSCOPIC GUIDANCE:  FLUOROSCOPY TIME:  0.9 minutes, 80.7 mGy  TECHNIQUE: The procedure, risks (including but not limited to bleeding, infection, organ damage ), benefits, and alternatives were explained to the patient. Questions regarding the procedure were encouraged and answered. The patient understands and consents to the procedure. Survey ultrasound of the abdomen was performed and an appropriate skin entry site was identified. Skin site was marked, prepped with Betadine,  and draped in usual sterile fashion, and infiltrated locally with 1% lidocaine.  Intravenous Fentanyl and Versed were administered as conscious sedation during continuous cardiorespiratory monitoring by the radiology RN, with a total moderate sedation time of 18 minutes.  Under real-time ultrasound guidance, gallbladder was accessed using a 21 gauge trocar needle with a direct fundal approach in hopes of avoiding the known liver lesions in minimizing bleeding risk. The 018 guidewire advanced easily within the gallbladder lumen, confirmed under ultrasound. However, passage of the 6 [place] transitional dilator resulting in buckling of the wire at the fundal entry site, with significant risk of loss of percutaneous intraluminal access, especially considering the need to further dilate to 10 [place]. For this reason, a transhepatic approach was selected. A path was identified avoiding any macroscopic lesions. Under real-time ultrasound guidance, the gallbladder was accessed using the Transhepatic approach with a 21-gauge needle. Ultrasound image documentation was saved. Bile returned through the hub. Needle was exchanged over a 018 guidewire for transitional dilator which allowed placement of 035 J wire. Over this, a 10.2 French pigtail catheter was advanced and formed centrally in the gallbladder lumen. Small contrast injection confirmed appropriate position. Catheter secured externally with 0 Prolene suture and placed external drain bag. Patient tolerated the procedure well.  COMPLICATIONS: COMPLICATIONS none  IMPRESSION: 1. Technically successful percutaneous cholecystostomy tube placement with ultrasound and fluoroscopic guidance.   Electronically Signed   By: Lucrezia Europe M.D.   On: 06/09/2014 10:35    Scheduled Meds: . acetaminophen  1,000 mg Oral TID  . amLODipine  5 mg Oral Daily  . atenolol  25 mg Oral Daily  . aztreonam  2 g Intravenous Q8H  . ferrous gluconate  324 mg Oral BID WC  . furosemide  40 mg  Intravenous Daily  . gabapentin  400 mg Oral TID  . heparin  5,000 Units Subcutaneous 3 times per day  . heparin lock flush  500 Units Intravenous Once  . insulin aspart  0-5 Units Subcutaneous QHS  . insulin aspart  0-9 Units Subcutaneous TID WC  . insulin glargine  10 Units Subcutaneous QHS  . levofloxacin (LEVAQUIN) IV  750 mg Intravenous Q24H  . lip balm  1 application Topical  BID  . polyethylene glycol  17 g Oral BID  . saccharomyces boulardii  250 mg Oral BID  . sodium chloride  3 mL Intravenous Q12H  . spironolactone  25 mg Oral Daily  . vancomycin  1,500 mg Intravenous Q12H   Continuous Infusions: . 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 If 7PM-7AM, please contact night-coverage at [email or web address], password [facility] 06/10/2014, 4:50 PM  LOS: 5 days

## 2014-06-10 NOTE — Care Management Note (Signed)
Case Management Note  Patient Details  Name: Yesenia Lyons MRN: [id number] Date of Birth: 04/05/1950  Subjective/Objective:   sepsis                 Action/Plan:Home with [facility]   Expected Discharge Date:                  Expected Discharge Plan:  [place]  In-House Referral:     Discharge planning Services  CM Consult  Post Acute Care Choice:    Choice offered to:  Patient  DME Arranged:    DME Agency:     HH Arranged:  RN, PT, Nurse's Aide [place] Agency:  [place]  Status of Service:  In process, will continue to follow  Medicare Important Message Given:    Date Medicare IM Given:    Medicare IM give by:    Date Additional Medicare IM Given:    Additional Medicare Important Message give by:     If discussed at [place] of Stay Meetings, dates discussed:    Additional CommentsPurcell Mouton, RN 06/10/2014, 3:37 PM

## 2014-06-11 LAB — GLUCOSE, CAPILLARY
GLUCOSE-CAPILLARY: 171 mg/dL — AB (ref 65–99)
Glucose-Capillary: 130 mg/dL — ABNORMAL HIGH (ref 65–99)

## 2014-06-11 LAB — CBC
HCT: 36.1 % (ref 36.0–46.0)
HEMOGLOBIN: 11.2 g/dL — AB (ref 12.0–15.0)
MCH: 28.3 pg (ref 26.0–34.0)
MCHC: 31 g/dL (ref 30.0–36.0)
MCV: 91.2 fL (ref 78.0–100.0)
Platelets: 460 10*3/uL — ABNORMAL HIGH (ref 150–400)
RBC: 3.96 MIL/uL (ref 3.87–5.11)
RDW: 13.8 % (ref 11.5–15.5)
WBC: 13.1 10*3/uL — AB (ref 4.0–10.5)

## 2014-06-11 LAB — COMPREHENSIVE METABOLIC PANEL
ALT: 19 U/L (ref 14–54)
ANION GAP: 6 (ref 5–15)
AST: 28 U/L (ref 15–41)
Albumin: 2.3 g/dL — ABNORMAL LOW (ref 3.5–5.0)
Alkaline Phosphatase: 634 U/L — ABNORMAL HIGH (ref 38–126)
BUN: 8 mg/dL (ref 6–20)
CO2: 27 mmol/L (ref 22–32)
Calcium: 8.5 mg/dL — ABNORMAL LOW (ref 8.9–10.3)
Chloride: 105 mmol/L (ref 101–111)
Creatinine, Ser: 0.58 mg/dL (ref 0.44–1.00)
GFR calc non Af Amer: >60 mL/min (ref >60)
GLUCOSE: 140 mg/dL — AB (ref 65–99)
POTASSIUM: 3.7 mmol/L (ref 3.5–5.1)
Sodium: 138 mmol/L (ref 135–145)
Total Bilirubin: 0.8 mg/dL (ref 0.3–1.2)
Total Protein: 7.2 g/dL (ref 6.5–8.1)

## 2014-06-11 MED ORDER — INSULIN GLARGINE 100 UNIT/ML ~~LOC~~ SOLN: 10.0000 [IU] | Freq: Two times a day (BID) | SUBCUTANEOUS | Status: AC

## 2014-06-11 MED ORDER — GABAPENTIN 400 MG PO CAPS: 400.0000 mg | ORAL_CAPSULE | Freq: Three times a day (TID) | ORAL | Status: AC

## 2014-06-11 MED ORDER — SACCHAROMYCES BOULARDII 250 MG PO CAPS: 250.0000 mg | ORAL_CAPSULE | Freq: Two times a day (BID) | ORAL | Status: AC

## 2014-06-11 MED ORDER — LEVOFLOXACIN 750 MG PO TABS: 750.0000 mg | ORAL_TABLET | Freq: Every day | ORAL | Status: DC

## 2014-06-11 NOTE — Discharge Summary (Addendum)
Physician Discharge Summary  Yesenia Lyons YKZ:[id number] DOB: May 28, 1950 DOA: 06/03/2014  PCP: Isaias Cowman, PA-C  Admit date: 06/03/2014 Discharge date: 06/11/2014  Recommendations for Outpatient Follow-up:  1. Follow up with general surgery, Dr. Johney Maine, in 2-3 weeks to arrange repeat cholangiogram at around 6 weeks.  F/u biliary culture 2. If questions about the drain, family can contact radiology at [phone] or [phone]. 3. Dr. Burr Medico for routine follow up with metastatic carcinoid tumor 4. PCP in 1 week to review diabetes management.  Had to reduce insulin drastically but anticipate she will need to resume her higher doses in the next few days.   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 Burr Medico who presented initially with altered mental status, found to be floridly septic with LFT's more elevated than baseline. Abd [place] was suspicious for acute cholecystitis and the patient was started on cipro/flagyl given PCN allergy. Surgery was called, who recommended HIDA scan. The patient's WBC continued to worsen and abx were broadened to vanc, levaquin, and aztreonam. WBC has since improved. Follow up HIDA was ultimately obtained which also suggested colicystitis and Surgery was formally consulted with recommendations  for IR guided perc drain which she received on 6/2  Hospital Course:   Sepsis secondary to acute cholecystitis - CT scan on 5/26 was unremarkable for biliary obstruction - Abdominal ultrasound demonstrated sludge within the gallbladder and gallbladder wall thickening with trace fluid surrounding the gallbladder - HIDA scan demonstrated no evidence of contrast within the gallbladder consistent with acute cholecystitis - General surgery was consulted who recommended against surgical resection due to her comorbidities  - Interventional radiology performed cholecystostomy drain placement on 6/2 without complication - Transaminitis trended down - She continued vancomycin, aztreonam, and levofloxacin but was narrowed to monotherapy levofloxacin at discharge since her cultures were all negative. - Follow-up biliary culture - F/u Dr. Johney Maine in 2-3 weeks. Arrange for cholangiogram at 6 weeks and if no obstruction or stones, remove drain. If obstructive or positive for stones, consider lap chole vs. Indefinite biliary drain - Drain should remain in for 4-6 weeks unless gallbladder removed in the interim: Once daily flushes with 5-10 mL saline and recording output every other day. contact [phone] or [phone] with any questions  Acute encephalopathy secondary to sepsis has resolved with treatment of her acute infection  Metastatic carcinoid tumor followed by Dr. Truitt Merle. Continue octreotide.  Pseudohyponatremia secondary to hyperglycemia, resolved.  Diabetes mellitus type 2 with stable blood sugars for the last 24 hours - Continued Lantus 10 units sliding scale insulin  Hypertension, blood pressure is mildly elevated likely secondary to pain - Continued pain medication and blood pressure medications  Chronic diastolic heart failure with increased peripheral edema on exam after holding diuretics for sepsis. - Resumed diuretic  Consultants:  General  surgery  IR  Procedures:  CT abd/pelvis  Abd [place]  HIDA  Antibiotics:  Ciprofloxacin 5/28>>>5/30  Flagyl 5/28>>>5/30  Aztreonam 5/30>>> 6/4  Levaquin 5/30>>>  Vancomycin 5/30>>> 6/4  Discharge Exam: Filed Vitals:   06/11/14 0445  BP: 134/66  Pulse: 56  Temp: 97.7 F (36.5 C)  Resp: 20   Filed Vitals:   06/10/14 1041 06/10/14 1427 06/10/14 2048 06/11/14 0445  BP:  147/94 136/63 134/66  Pulse: 51 69 59 56  Temp:  97.8 F (36.6 C) 98.1 F (36.7 C) 97.7 F (36.5 C)  TempSrc:  Oral Oral Oral  Resp:  22 20 20   Height:      Weight:    147.2 kg (324 lb 8.3 oz)  SpO2:  97% 100% 98%     General: Obese F, sitting in chair  Cardiovascular: regular, s1, s2  Respiratory: normal resp effort, no wheezng  Abdomen: NABS, soft, ND, TTP in the RUQ, epigastric area, umbilical area without rebound or guarding, improved from prior. The drain contains clear brown fluid, no surrounding induration, erythema, or discharge at drain site.  Musculoskeletal: Perfused, no clubbing, no cyanosis, 1+ bilateral pitting LEE but improved despite sitting in chair  Discharge Instructions      Discharge Instructions    (HEART FAILURE PATIENTS) Call MD:  Anytime you have any of the following symptoms: 1) 3 pound weight gain in 24 hours or 5 pounds in 1 week 2) shortness of breath, with or without a dry hacking cough 3) swelling in the hands, feet or stomach 4) if you have to sleep on extra pillows at night in order to breathe.    Complete by:  As directed      Call MD for:  difficulty breathing, headache or visual disturbances    Complete by:  As directed      Call MD for:  extreme fatigue    Complete by:  As directed      Call MD for:  hives    Complete by:  As directed      Call MD for:  persistant dizziness or light-headedness    Complete by:  As directed      Call MD for:  persistant nausea and vomiting    Complete by:  As directed      Call MD for:  redness, tenderness, or  signs of infection (pain, swelling, redness, odor or green/yellow discharge around incision site)    Complete by:  As directed      Call MD for:  severe uncontrolled pain    Complete by:  As directed      Call MD for:  temperature >100.4    Complete by:  As directed      Diet Carb Modified    Complete by:  As directed      Discharge instructions    Complete by:  As directed   You were hospitalized with cholecystitis or gallbladder infection. You had a drain placed in her gallbladder to drain out infection and you have received antibiotics.  You need 3 more days of levofloxacin and your next dose is due tomorrow morning.  I have given you a prescription for a probiotic called florastor to reduce your risk of infectious diarrhea.  If you have worsening diarrhea anytime in the next three months, please seek medical attention to be tested for infectious diarrhea which can be dangerous and which requires treatment with special antibiotics.  Your drain will stay in  for at least 4-6 weeks at which time the surgeons will check to see if your gallbladder has healed on its own or not.  You will need to empty your drain once a day and flush it with 10 mL of saline to prevent it from clogging.  Change the dressing around the drain once daily or as needed.  If you have questions or problems with your drain, call [phone] or [phone].  Your blood sugars have been low because of your illness, which is normal.  Please take lantus insulin 10 units twice a day and stop all of your other insulin for now.  Check your blood sugars four times a day and once your blood sugars are consistently greater than 180, call your primary care doctor's office for further instructions.  Record your blood sugars and bring them with you to your next appointment for your doctor to review in about 1 week.     Increase activity slowly    Complete by:  As directed             Medication List    STOP taking these medications         insulin aspart 100 UNIT/ML injection  Commonly known as:  novoLOG     Zinc 50 MG Tabs      TAKE these medications        albuterol 108 (90 BASE) MCG/ACT inhaler  Commonly known as:  PROVENTIL HFA;VENTOLIN HFA  Inhale 2 puffs into the lungs every 6 (six) hours as needed for wheezing (wheezing).     amLODipine-benazepril 5-10 MG per capsule  Commonly known as:  LOTREL  Take 1 capsule by mouth daily.     atenolol 25 MG tablet  Commonly known as:  TENORMIN  Take 25 mg by mouth daily.     beclomethasone 80 MCG/ACT inhaler  Commonly known as:  QVAR  Inhale 1 puff into the lungs as needed (sob). breathing     feeding supplement (ENSURE COMPLETE) Liqd  Take 237 mLs by mouth 2 (two) times daily between meals.     ferrous gluconate 324 MG tablet  Commonly known as:  FERGON  Take 324 mg by mouth 2 (two) times daily with a meal.     furosemide 40 MG tablet  Commonly known as:  LASIX  Take 1 tablet (40 mg total) by mouth daily.     gabapentin 400 MG capsule  Commonly known as:  NEURONTIN  Take 1 capsule (400 mg total) by mouth 3 (three) times daily.     Ginger Root 550 MG Caps  Take 1 capsule by mouth daily as needed (nutrition).     insulin glargine 100 UNIT/ML injection  Commonly known as:  LANTUS  Inject 0.1 mLs (10 Units total) into the skin 2 (two) times daily. In the morning and at bedtime.     levofloxacin 750 MG tablet  Commonly known as:  LEVAQUIN  Take 1 tablet (750 mg total) by mouth daily.  Start taking on:  06/12/2014     ondansetron 4 MG disintegrating tablet  Commonly known as:  ZOFRAN ODT  Take 1 tablet (4 mg total) by mouth every 8 (eight) hours as needed for nausea or vomiting.     oxyCODONE-acetaminophen 10-325 MG per tablet  Commonly known as:  PERCOCET  Take 1 tablet by mouth every 8 (eight) hours as needed for pain.     saccharomyces boulardii 250 MG capsule  Commonly known as:  FLORASTOR  Take 1  capsule (250 mg total) by mouth 2 (two) times  daily.     spironolactone 25 MG tablet  Commonly known as:  ALDACTONE  Take 25 mg by mouth daily.     vitamin C 500 MG tablet  Commonly known as:  ASCORBIC ACID  Take 1 tablet (500 mg total) by mouth daily.       Follow-up Information    Follow up with Isaias Cowman, PA-C. Schedule an appointment as soon as possible for a visit in 1 week.   Specialty:  Cardiology   Contact information:   [facility]  [address] [place] [address] [phone]       Follow up with Adin Hector., MD. Schedule an appointment as soon as possible for a visit in 2 weeks.   Specialty:  General Surgery   Contact information:   [address] [place] [address] [phone]       Follow up with Truitt Merle, MD. Schedule an appointment as soon as possible for a visit in 2 weeks.   Specialties:  Hematology, Oncology   Contact information:   [place] [address] [phone]        The results of significant diagnostics from this hospitalization (including imaging, microbiology, ancillary and laboratory) are listed below for reference.    Significant Diagnostic Studies: Ct Head Wo Contrast  06/04/2014   CLINICAL DATA:  Weakness and decreased appetite. Memory problems. Acute encephalopathy.  EXAM: CT HEAD WITHOUT CONTRAST  TECHNIQUE: Contiguous axial images were obtained from the base of the skull through the vertex without intravenous contrast.  COMPARISON:  None.  FINDINGS: Skull and Sinuses:Extensive hyperostosis, especially in the frontal region with crowding of the frontal lobes.  There has been endoscopic sinus surgery on the right with opening of the maxillary and ethmoid sinuses. 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 No evidence of acute sinusitis.  Orbits: No acute abnormality.  Brain: Expanded low-density sella consistent with a empty sella. Based on patient's age this is considered  incidental.  There is inferior right frontal well circumscribed low-density (including gyrus rectus) appearance suggesting gliosis and location suggesting previous trauma. Hyperostosis crowds the frontal lobes bilaterally with diffuse sulcal effacement. No evidence of acute infarct, hemorrhage, hydrocephalus, or mass lesion.  IMPRESSION: 1. No acute findings. 2. Right inferior frontal gliosis, location favoring remote contusion. 3. 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   Electronically Signed   By: Monte Fantasia M.D.   On: 06/04/2014 01:44   Nm Hepatobiliary Liver Func  06/07/2014   CLINICAL DATA:  Right upper quadrant pain. Cholecystitis. Gallbladder sludge and wall thickening seen on recent ultrasound. Multiple liver masses. Carcinoid tumor.  EXAM: NUCLEAR MEDICINE HEPATOBILIARY IMAGING  TECHNIQUE: Sequential images of the abdomen were obtained out to 60 minutes following intravenous administration of radiopharmaceutical. Because of lack of gallbladder activity, imaging was continued for an additional 60 minutes following administration of 5.9 mg morphine IV and additional 2.0 mCi technetium 99 M Choletec.  RADIOPHARMACEUTICALS:  9.6 Tc-39m Choletec IV  COMPARISON:  Abdomen ultrasound on 06/05/2014 and abdomen CT on 06/01/2014  FINDINGS: Prompt radiopharmaceutical uptake by the liver is seen. The liver shows multiple photopenic masslike areas, consistent with numerous liver masses as seen on recent CT, suspicious for liver metastases.  Biliary excretion of activity is delayed indicating hepatocellular dysfunction. Biliary activity reaches small bowel initially on the 35 minutes image.  No gallbladder activity is visualized prior to morphine administration, as well as during additional 60 minutes of imaging following administration of intravenous morphine.  IMPRESSION: Absence of gallbladder activity, suggesting acute cholecystitis. Note that false positive results can occur  in setting of setting of prolonged NPO status or gallbladder sludge.  No evidence of biliary obstruction.  Diffuse photopenic lesions throughout the liver, consistent liver metastases.   Electronically Signed   By: Earle Gell M.D.   On: 06/07/2014 14:09   Ct Abdomen Pelvis W Contrast  06/01/2014   CLINICAL DATA:  Mid abdominal pain.  EXAM: CT ABDOMEN AND PELVIS WITH CONTRAST  TECHNIQUE: Multidetector CT imaging of the abdomen and pelvis was performed using the standard protocol following bolus administration of intravenous contrast.  CONTRAST:  132mL OMNIPAQUE IOHEXOL 300 MG/ML  SOLN  COMPARISON:  12/27/2013  FINDINGS: Lower chest: There are no pleural effusions. The heart size is mildly enlarged. Mild subsegmental atelectasis noted in the lung bases.  Hepatobiliary: Innumerable liver metastasis are again identified. Lesions appear amorphous and are difficult to accurately measure and compare with previous exam. Index lesion within the lateral right lobe measures 4.7 cm, image 41/series 3. Previously 4.0 cm. This may be due to differences in technique, noise from patient's body habitus and or differences in phase of contrast opacification. The gallbladder appears normal. There is no biliary dilatation.  Pancreas:  Negative.  Spleen: The spleen is negative.  Adrenals/Urinary Tract: Normal right adrenal gland. Left adrenal nodule measures 6 x 5.6 cm, image 60/series 2. Previously 5.8 by 5.5 cm. Normal appearance of both kidneys. The urinary bladder appears normal for degree of distention.  Stomach/Bowel: The stomach and the small bowel loops have a normal course and caliber. There is no bowel obstruction. Normal appearance of the proximal colon. The distal colon is also unremarkable.  Vascular/Lymphatic: Calcified atherosclerotic disease involves the abdominal aorta. No aneurysm. Index aortocaval lymph node measures 1.5 cm, image 42 of series 4. Previously this measured the same. The partially calcified index  nodal mass within the mesenteric measures 3.1 x 4.9 cm, image 119 of series 3. This is compared with 3.1 x 4.2 cm previously.  Reproductive: Previous hysterectomy.  No adnexal mass noted.  Other: There is no free fluid or fluid collections within the abdomen or pelvis. No peritoneal nodule or mass.  Musculoskeletal: Degenerative disc disease is identified within the lumbar spine. There are no aggressive lytic or sclerotic bone lesions.  IMPRESSION: 1. No acute findings identified. 2. Multi focal liver metastasis are again identified. When compared with previous exam the dominant lesion in the right hepatic lobe is felt to the larger on today's study. 3. Partially calcified lesion within the central mesenteric a slightly increased in the interval. Stable retroperitoneal adenopathy. 4. Aortic atherosclerosis.   Electronically Signed   By: Kerby Moors M.D.   On: 06/01/2014 09:28   Ir Perc Cholecystostomy  06/09/2014   CLINICAL DATA:  Pericholecystic fluid, gallbladder sludge, nonvisualization on scintigraphy.  EXAM: PERCUTANEOUS CHOLECYSTOSTOMY TUBE PLACEMENT WITH ULTRASOUND AND FLUOROSCOPIC GUIDANCE:  FLUOROSCOPY TIME:  0.9 minutes, 80.7 mGy  TECHNIQUE: The procedure, risks (including but not limited to bleeding, infection, organ damage ), benefits, and alternatives were explained to the patient. Questions regarding the procedure were encouraged and answered. The patient understands and consents to the procedure. Survey ultrasound of the abdomen was performed and an appropriate skin entry site was identified. Skin site was marked, prepped with Betadine, and draped in usual sterile fashion, and infiltrated locally  with 1% lidocaine.  Intravenous Fentanyl and Versed were administered as conscious sedation during continuous cardiorespiratory monitoring by the radiology RN, with a total moderate sedation time of 18 minutes.  Under real-time ultrasound guidance, gallbladder was accessed using a 21 gauge trocar needle  with a direct fundal approach in hopes of avoiding the known liver lesions in minimizing bleeding risk. The 018 guidewire advanced easily within the gallbladder lumen, confirmed under ultrasound. However, passage of the 6 [place] transitional dilator resulting in buckling of the wire at the fundal entry site, with significant risk of loss of percutaneous intraluminal access, especially considering the need to further dilate to 10 [place]. For this reason, a transhepatic approach was selected. A path was identified avoiding any macroscopic lesions. Under real-time ultrasound guidance, the gallbladder was accessed using the Transhepatic approach with a 21-gauge needle. Ultrasound image documentation was saved. Bile returned through the hub. Needle was exchanged over a 018 guidewire for transitional dilator which allowed placement of 035 J wire. Over this, a 10.2 French pigtail catheter was advanced and formed centrally in the gallbladder lumen. Small contrast injection confirmed appropriate position. Catheter secured externally with 0 Prolene suture and placed external drain bag. Patient tolerated the procedure well.  COMPLICATIONS: COMPLICATIONS none  IMPRESSION: 1. Technically successful percutaneous cholecystostomy tube placement with ultrasound and fluoroscopic guidance.   Electronically Signed   By: Lucrezia Europe M.D.   On: 06/09/2014 10:35   Dg Chest Port 1 View  06/03/2014   CLINICAL DATA:  Weakness  EXAM: PORTABLE CHEST - 1 VIEW  COMPARISON:  10/30/2013  FINDINGS: Cardiac shadow is enlarged. The overall inspiratory effort is poor with bibasilar atelectatic changes. No acute bony abnormality is noted.  IMPRESSION: Poor inspiratory effort with bibasilar atelectasis.   Electronically Signed   By: Inez Catalina M.D.   On: 06/03/2014 21:44   US Abdomen Limited Ruq  06/05/2014   CLINICAL DATA:  Cholecystitis, right upper quadrant pain  EXAM: US ABDOMEN LIMITED - RIGHT UPPER QUADRANT  COMPARISON:  06/01/2014   FINDINGS: Gallbladder:  There is abundant sludge within the gallbladder. The wall the gallbladder is thickened at 5 mm. There is trace fluid around the gallbladder. There is no Murphy's sign. There are no stones.  Common bile duct:  Diameter: 4 mm  Liver:  There is heterogeneous hepatic echotexture. Multiple areas of masslike appearance are suggested the largest in the left lobe measuring 4 x 3.5 x 3.5 cm. The liver is enlarged at 23 cm span.  Patient is tender over the right upper quadrant.  IMPRESSION: 1. Hepatomegaly with heterogeneous hepatic echotexture and multiple areas of masslike attenuation. This is consistent with the periods of multiple metastatic lesions on recent CT scan. 2. Although there is no sonographic Murphy sign, there is abundant sludge in the gallbladder, gallbladder wall thickening, and trace fluid around the gallbladder.   Electronically Signed   By: Skipper Cliche M.D.   On: 06/05/2014 12:51    Microbiology: Recent Results (from the past 240 hour(s))  Culture, blood (routine x 2)     Status: None   Collection Time: 06/04/14  8:29 AM  Result Value Ref Range Status   Specimen Description BLOOD RIGHT ARM  Final   Special Requests   Final    BOTTLES DRAWN AEROBIC AND ANAEROBIC 10CC BOTH BOTTLES   Culture   Final    NO GROWTH 5 DAYS Performed at [facility]    Report Status 06/10/2014 FINAL  Final  Culture, blood (routine  x 2)     Status: None   Collection Time: 06/04/14  8:40 AM  Result Value Ref Range Status   Specimen Description BLOOD RIGHT ARM  Final   Special Requests BOTTLES DRAWN AEROBIC ONLY 5CC BLUE BOTTLE  Final   Culture   Final    NO GROWTH 5 DAYS Performed at [facility]    Report Status 06/10/2014 FINAL  Final  Body fluid culture     Status: None (Preliminary result)   Collection Time: 06/09/14  9:00 AM  Result Value Ref Range Status   Specimen Description BILE  Final   Special Requests NONE  Final   Gram Stain   Final    NO WBC  SEEN NO ORGANISMS SEEN Performed at [facility]    Culture   Final    NO GROWTH 2 DAYS Performed at [facility]    Report Status PENDING  Incomplete  Clostridium Difficile by PCR     Status: None   Collection Time: 06/10/14  8:01 AM  Result Value Ref Range Status   C difficile by pcr NEGATIVE NEGATIVE Final     Labs: Basic Metabolic Panel:  Recent Labs Lab 06/07/14 0515 06/08/14 0630 06/09/14 0512 06/10/14 0714 06/11/14 0550  NA 132* 138 138 131* 138  K 3.2* 3.4* 3.4* 3.7 3.7  CL 100* 104 104 99* 105  CO2 25 27 26 24 27   GLUCOSE 58* 109* 155* 181* 140*  BUN 20 14 11 9 8   CREATININE 1.19* 0.88 0.77 0.66 0.58  CALCIUM 7.6* 8.0* 8.2* 7.7* 8.5*   Liver Function Tests:  Recent Labs Lab 06/07/14 0515 06/08/14 0630 06/09/14 0512 06/10/14 0714 06/11/14 0550  AST 29 24 23 25 28   ALT 17 16 16 17 19   ALKPHOS 298* 353* 516* 511* 634*  BILITOT 2.0* 1.2 1.1 1.1 0.8  PROT 6.9 7.0 7.0 6.7 7.2  ALBUMIN 2.2* 2.0* 2.0* 1.7* 2.3*   No results for input(s): LIPASE, AMYLASE in the last 168 hours. No results for input(s): AMMONIA in the last 168 hours. CBC:  Recent Labs Lab 06/07/14 0515 06/08/14 0630 06/09/14 0512 06/10/14 0714 06/11/14 0550  WBC 20.9* 16.3* 13.8* 12.2* 13.1*  NEUTROABS 16.3* 12.6*  --   --   --   HGB 10.5* 10.3* 10.5* 10.3* 11.2*  HCT 33.2* 33.3* 34.5* 33.2* 36.1  MCV 90.7 92.8 92.7 91.0 91.2  PLT 280 340 406* 395 460*   Cardiac Enzymes: No results for input(s): CKTOTAL, CKMB, CKMBINDEX, TROPONINI in the last 168 hours. BNP: BNP (last 3 results) No results for input(s): BNP in the last 8760 hours.  ProBNP (last 3 results) No results for input(s): PROBNP in the last 8760 hours.  CBG:  Recent Labs Lab 06/09/14 2152 06/10/14 0740 06/10/14 1132 06/10/14 1625 06/10/14 2051  GLUCAP 179* 160* 187* 162* 168*    Time coordinating discharge: 35 minutes  Signed:  D'Arcy Abraha  [facility] 06/11/2014, 1:15  PM

## 2014-06-11 NOTE — Progress Notes (Signed)
Patient ID: Yesenia Lyons, female   DOB: [birth year], 64 y.o.   MRN: [id number]  [facility]  Subjective: Patient up in chair "feeling much, much better".  Objective: Vital signs in last 24 hours: Temp:  [97.7 F (36.5 C)-98.1 F (36.7 C)] 97.7 F (36.5 C) (06/04 0445) Pulse Rate:  [56-69] 56 (06/04 0445) Resp:  [20-22] 20 (06/04 0445) BP: (134-147)/(63-94) 134/66 mmHg (06/04 0445) SpO2:  [97 %-100 %] 98 % (06/04 0445) Weight:  [147.2 kg (324 lb 8.3 oz)] 147.2 kg (324 lb 8.3 oz) (06/04 0445) Last BM Date: 06/10/14  Intake/Output from previous day: 06/03 0701 - 06/04 0700 In: 1490 [P.O.:120; I.V.:120; IV Piggyback:1250] Out: 1470 [Urine:1400; Drains:70] Intake/Output this shift: Total I/O In: 410 [P.O.:360; IV Piggyback:50] Out: -   Physical Exam: HEENT - sclerae clear, mucous membranes moist Chest - clear bilaterally Cor - RRR Abdomen - soft, obese; mild tenderness palpation RUQ; drain with bilious output, thin, clear Neuro - alert & oriented, no focal deficits  Lab Results:   Recent Labs  06/10/14 0714 06/11/14 0550  WBC 12.2* 13.1*  HGB 10.3* 11.2*  HCT 33.2* 36.1  PLT 395 460*   BMET  Recent Labs  06/10/14 0714 06/11/14 0550  NA 131* 138  K 3.7 3.7  CL 99* 105  CO2 24 27  GLUCOSE 181* 140*  BUN 9 8  CREATININE 0.66 0.58  CALCIUM 7.7* 8.5*   PT/INR No results for input(s): LABPROT, INR in the last 72 hours. Comprehensive Metabolic Panel:    Component Value Date/Time   NA 138 06/11/2014 0550   NA 131* 06/10/2014 0714   NA 139 05/26/2014 1513   NA 142 03/03/2014 1357   K 3.7 06/11/2014 0550   K 3.7 06/10/2014 0714   K 3.9 05/26/2014 1513   K 3.7 03/03/2014 1357   CL 105 06/11/2014 0550   CL 99* 06/10/2014 0714   CO2 27 06/11/2014 0550   CO2 24 06/10/2014 0714   CO2 25 05/26/2014 1513   CO2 25 03/03/2014 1357   BUN 8 06/11/2014 0550   BUN 9 06/10/2014 0714   BUN 10.2 05/26/2014 1513   BUN 16.1 03/03/2014  1357   CREATININE 0.58 06/11/2014 0550   CREATININE 0.66 06/10/2014 0714   CREATININE 1.0 05/26/2014 1513   CREATININE 1.1 03/03/2014 1357   GLUCOSE 140* 06/11/2014 0550   GLUCOSE 181* 06/10/2014 0714   GLUCOSE 252* 05/26/2014 1513   GLUCOSE 170* 03/03/2014 1357   CALCIUM 8.5* 06/11/2014 0550   CALCIUM 7.7* 06/10/2014 0714   CALCIUM 8.8 05/26/2014 1513   CALCIUM 9.2 03/03/2014 1357   AST 28 06/11/2014 0550   AST 25 06/10/2014 0714   AST 38* 05/26/2014 1513   AST 30 03/03/2014 1357   ALT 19 06/11/2014 0550   ALT 17 06/10/2014 0714   ALT 37 05/26/2014 1513   ALT 25 03/03/2014 1357   ALKPHOS 634* 06/11/2014 0550   ALKPHOS 511* 06/10/2014 0714   ALKPHOS 564* 05/26/2014 1513   ALKPHOS 406* 03/03/2014 1357   BILITOT 0.8 06/11/2014 0550   BILITOT 1.1 06/10/2014 0714   BILITOT 1.94* 05/26/2014 1513   BILITOT 0.83 03/03/2014 1357   PROT 7.2 06/11/2014 0550   PROT 6.7 06/10/2014 0714   PROT 7.8 05/26/2014 1513   PROT 7.7 03/03/2014 1357   ALBUMIN 2.3* 06/11/2014 0550   ALBUMIN 1.7* 06/10/2014 0714   ALBUMIN 2.9* 05/26/2014 1513   ALBUMIN 3.3* 03/03/2014 1357    Studies/Results: No  results found.  Anti-infectives: Anti-infectives    Start     Dose/Rate Route Frequency Ordered Stop   06/09/14 1400  vancomycin (VANCOCIN) 1,500 mg in sodium chloride 0.9 % 500 mL IVPB     1,500 mg 250 mL/hr over 120 Minutes Intravenous Every 12 hours 06/09/14 1335     06/07/14 1500  vancomycin (VANCOCIN) 1,750 mg in sodium chloride 0.9 % 500 mL IVPB  Status:  Discontinued     1,750 mg 250 mL/hr over 120 Minutes Intravenous Every 24 hours 06/06/14 0930 06/09/14 1335   06/07/14 1000  levofloxacin (LEVAQUIN) IVPB 750 mg     750 mg 100 mL/hr over 90 Minutes Intravenous Every 24 hours 06/06/14 0930     06/06/14 1254  aztreonam (AZACTAM) 2 g in dextrose 5 % 50 mL IVPB     2 g 100 mL/hr over 30 Minutes Intravenous Every 8 hours 06/06/14 0930     06/06/14 0930  vancomycin (VANCOCIN) 2,500 mg in  sodium chloride 0.9 % 500 mL IVPB     2,500 mg 250 mL/hr over 120 Minutes Intravenous  Once 06/06/14 0921 06/06/14 1731   06/06/14 0815  levofloxacin (LEVAQUIN) IVPB 750 mg     750 mg 100 mL/hr over 90 Minutes Intravenous  Once 06/06/14 0802 06/06/14 1246   06/06/14 0815  aztreonam (AZACTAM) 2 g in dextrose 5 % 50 mL IVPB     2 g 100 mL/hr over 30 Minutes Intravenous  Once 06/06/14 0802 06/06/14 1325   06/06/14 0815  vancomycin (VANCOCIN) IVPB 1000 mg/200 mL premix  Status:  Discontinued     1,000 mg 200 mL/hr over 60 Minutes Intravenous  Once 06/06/14 0802 06/06/14 0921   06/05/14 2200  metroNIDAZOLE (FLAGYL) IVPB 500 mg  Status:  Discontinued     500 mg 100 mL/hr over 60 Minutes Intravenous 3 times per day 06/05/14 1400 06/06/14 0802   06/05/14 2000  ciprofloxacin (CIPRO) IVPB 400 mg  Status:  Discontinued     400 mg 200 mL/hr over 60 Minutes Intravenous Every 12 hours 06/05/14 1410 06/06/14 0802   06/04/14 2200  metroNIDAZOLE (FLAGYL) tablet 500 mg  Status:  Discontinued     500 mg Oral 3 times per day 06/04/14 2124 06/05/14 1400   06/04/14 2130  ciprofloxacin (CIPRO) tablet 500 mg  Status:  Discontinued     500 mg Oral 2 times daily 06/04/14 2124 06/05/14 1400   06/04/14 1300  metroNIDAZOLE (FLAGYL) IVPB 500 mg  Status:  Discontinued     500 mg 100 mL/hr over 60 Minutes Intravenous 3 times per day 06/04/14 1218 06/04/14 2124   06/04/14 1300  ciprofloxacin (CIPRO) IVPB 400 mg  Status:  Discontinued     400 mg 200 mL/hr over 60 Minutes Intravenous Every 12 hours 06/04/14 1233 06/04/14 2124      Assessment & Plans: Cystic duct obstruction ?cholecystitis  S/p IR perc drain-Continue Antibiotics for minimum 4 more days, f/u cultures, follow clinically  Advance diet to low fat diet as tolerated  Will arrange follow up with Dr. Johney Maine in the [place] office in 2-3 weeks  Will sign off - call if needed  Earnstine Regal, MD, [facility] Office:  [place] 06/11/2014

## 2014-06-11 NOTE — Care Management Note (Signed)
Case Management Note  Patient Details  Name: Yesenia Lyons MRN: [id number] Date of Birth: [birth year]  Subjective/Objective:                    Action/Plan:d/c home w/HHC [facility].No further d/c needs or orders.   Expected Discharge Date:                  Expected Discharge Plan:  [place] ([facility]-Resumption of HHRN/HHPT/HH Nurse's aide.)  In-House Referral:     Discharge planning Services  CM Consult  Post Acute Care Choice:    Choice offered to:  Patient  DME Arranged:    DME Agency:     HH Arranged:  RN, PT, Nurse's Aide [place] Agency:  [place]  Status of Service:  Completed, signed off  Medicare Important Message Given:  Yes Date Medicare IM Given:  06/11/14 Medicare IM give by:  Dessa Phi Date Additional Medicare IM Given:    Additional Medicare Important Message give by:     If discussed at [place] of Stay Meetings, dates discussed:    Additional Comments:  Dessa Phi, RN 06/11/2014, 1:31 PM

## 2014-06-12 LAB — BODY FLUID CULTURE
Culture: NO GROWTH
Gram Stain: NONE SEEN

## 2014-06-24 ENCOUNTER — Other Ambulatory Visit (HOSPITAL_BASED_OUTPATIENT_CLINIC_OR_DEPARTMENT_OTHER): Payer: Medicare Other

## 2014-06-24 ENCOUNTER — Ambulatory Visit (HOSPITAL_BASED_OUTPATIENT_CLINIC_OR_DEPARTMENT_OTHER): Payer: Medicare Other

## 2014-06-24 VITALS — BP 125/91 | HR 88 | Temp 100.0°F

## 2014-06-24 DIAGNOSIS — C7B02 Secondary carcinoid tumors of liver: Secondary | ICD-10-CM

## 2014-06-24 DIAGNOSIS — C7B09 Secondary carcinoid tumors of other sites: Secondary | ICD-10-CM

## 2014-06-24 DIAGNOSIS — D3A098 Benign carcinoid tumors of other sites: Secondary | ICD-10-CM

## 2014-06-24 DIAGNOSIS — C7A00 Malignant carcinoid tumor of unspecified site: Secondary | ICD-10-CM

## 2014-06-24 LAB — COMPREHENSIVE METABOLIC PANEL (CC13)
ALT: 45 U/L (ref 0–55)
ANION GAP: 10 meq/L (ref 3–11)
AST: 42 U/L — ABNORMAL HIGH (ref 5–34)
Albumin: 2.6 g/dL — ABNORMAL LOW (ref 3.5–5.0)
Alkaline Phosphatase: 612 U/L — ABNORMAL HIGH (ref 40–150)
BUN: 13 mg/dL (ref 7.0–26.0)
CALCIUM: 9.1 mg/dL (ref 8.4–10.4)
CO2: 27 mEq/L (ref 22–29)
Chloride: 100 mEq/L (ref 98–109)
Creatinine: 1.2 mg/dL — ABNORMAL HIGH (ref 0.6–1.1)
EGFR: 53 mL/min/{1.73_m2} — ABNORMAL LOW (ref >90)
Glucose: 218 mg/dl — ABNORMAL HIGH (ref 70–140)
Potassium: 3.9 mEq/L (ref 3.5–5.1)
SODIUM: 137 meq/L (ref 136–145)
TOTAL PROTEIN: 8 g/dL (ref 6.4–8.3)
Total Bilirubin: 2.21 mg/dL — ABNORMAL HIGH (ref 0.20–1.20)

## 2014-06-24 LAB — CBC WITH DIFFERENTIAL/PLATELET
BASO%: 0.3 % (ref 0.0–2.0)
BASOS ABS: 0 10*3/uL (ref 0.0–0.1)
EOS%: 0.3 % (ref 0.0–7.0)
Eosinophils Absolute: 0.1 10*3/uL (ref 0.0–0.5)
HEMATOCRIT: 34.3 % — AB (ref 34.8–46.6)
HEMOGLOBIN: 10.9 g/dL — AB (ref 11.6–15.9)
LYMPH%: 13.1 % — AB (ref 14.0–49.7)
MCH: 29 pg (ref 25.1–34.0)
MCHC: 31.8 g/dL (ref 31.5–36.0)
MCV: 91.2 fL (ref 79.5–101.0)
MONO#: 2 10*3/uL — ABNORMAL HIGH (ref 0.1–0.9)
MONO%: 14.1 % — AB (ref 0.0–14.0)
NEUT#: 10.4 10*3/uL — ABNORMAL HIGH (ref 1.5–6.5)
NEUT%: 72.2 % (ref 38.4–76.8)
Platelets: 268 10*3/uL (ref 145–400)
RBC: 3.76 10*6/uL (ref 3.70–5.45)
RDW: 14.6 % — ABNORMAL HIGH (ref 11.2–14.5)
WBC: 14.4 10*3/uL — ABNORMAL HIGH (ref 3.9–10.3)
lymph#: 1.9 10*3/uL (ref 0.9–3.3)

## 2014-06-24 MED ORDER — OCTREOTIDE ACETATE 30 MG IM KIT
30.0000 mg | PACK | Freq: Once | INTRAMUSCULAR | Status: AC
  Administered 2014-06-24: 30 mg via INTRAMUSCULAR
  Filled 2014-06-24: qty 1

## 2014-06-27 ENCOUNTER — Other Ambulatory Visit: Payer: Self-pay

## 2014-06-27 DIAGNOSIS — K819 Cholecystitis, unspecified: Secondary | ICD-10-CM

## 2014-06-27 DIAGNOSIS — K810 Acute cholecystitis: Secondary | ICD-10-CM

## 2014-06-29 LAB — CHROMOGRANIN A: Chromogranin A: 72 ng/mL — ABNORMAL HIGH (ref <=15)

## 2014-07-07 ENCOUNTER — Ambulatory Visit: Payer: Medicare Other

## 2014-07-08 DIAGNOSIS — R197 Diarrhea, unspecified: Secondary | ICD-10-CM

## 2014-07-08 HISTORY — DX: Diarrhea, unspecified: R19.7

## 2014-07-08 HISTORY — PX: CHOLECYSTECTOMY: SHX55

## 2014-07-12 ENCOUNTER — Other Ambulatory Visit (HOSPITAL_COMMUNITY): Payer: Medicare Other

## 2014-07-18 ENCOUNTER — Ambulatory Visit (HOSPITAL_COMMUNITY)
Admission: RE | Admit: 2014-07-18 | Discharge: 2014-07-18 | Disposition: A | Discharge status: Home / Self Care | Payer: Medicare Other | Source: Ambulatory Visit | Attending: Surgery | Admitting: Surgery

## 2014-07-18 DIAGNOSIS — R1011 Right upper quadrant pain: Secondary | ICD-10-CM | POA: Diagnosis not present

## 2014-07-18 DIAGNOSIS — I10 Essential (primary) hypertension: Secondary | ICD-10-CM | POA: Diagnosis present

## 2014-07-18 DIAGNOSIS — E43 Unspecified severe protein-calorie malnutrition: Secondary | ICD-10-CM | POA: Diagnosis present

## 2014-07-18 DIAGNOSIS — Z4682 Encounter for fitting and adjustment of non-vascular catheter: Secondary | ICD-10-CM | POA: Insufficient documentation

## 2014-07-18 DIAGNOSIS — D72829 Elevated white blood cell count, unspecified: Secondary | ICD-10-CM | POA: Diagnosis present

## 2014-07-18 DIAGNOSIS — K819 Cholecystitis, unspecified: Secondary | ICD-10-CM

## 2014-07-18 DIAGNOSIS — Z79891 Long term (current) use of opiate analgesic: Secondary | ICD-10-CM

## 2014-07-18 DIAGNOSIS — C7A00 Malignant carcinoid tumor of unspecified site: Secondary | ICD-10-CM | POA: Diagnosis present

## 2014-07-18 DIAGNOSIS — A419 Sepsis, unspecified organism: Principal | ICD-10-CM | POA: Diagnosis present

## 2014-07-18 DIAGNOSIS — Z794 Long term (current) use of insulin: Secondary | ICD-10-CM

## 2014-07-18 DIAGNOSIS — N179 Acute kidney failure, unspecified: Secondary | ICD-10-CM | POA: Diagnosis present

## 2014-07-18 DIAGNOSIS — E876 Hypokalemia: Secondary | ICD-10-CM | POA: Diagnosis not present

## 2014-07-18 DIAGNOSIS — Z880 Allergy status to penicillin: Secondary | ICD-10-CM

## 2014-07-18 DIAGNOSIS — Z79899 Other long term (current) drug therapy: Secondary | ICD-10-CM

## 2014-07-18 DIAGNOSIS — E780 Pure hypercholesterolemia: Secondary | ICD-10-CM | POA: Diagnosis present

## 2014-07-18 DIAGNOSIS — K8010 Calculus of gallbladder with chronic cholecystitis without obstruction: Secondary | ICD-10-CM | POA: Diagnosis present

## 2014-07-18 DIAGNOSIS — E860 Dehydration: Secondary | ICD-10-CM | POA: Diagnosis present

## 2014-07-18 DIAGNOSIS — E875 Hyperkalemia: Secondary | ICD-10-CM | POA: Diagnosis present

## 2014-07-18 DIAGNOSIS — I5032 Chronic diastolic (congestive) heart failure: Secondary | ICD-10-CM | POA: Diagnosis present

## 2014-07-18 DIAGNOSIS — E1165 Type 2 diabetes mellitus with hyperglycemia: Secondary | ICD-10-CM | POA: Diagnosis present

## 2014-07-18 DIAGNOSIS — C7B02 Secondary carcinoid tumors of liver: Secondary | ICD-10-CM | POA: Insufficient documentation

## 2014-07-18 DIAGNOSIS — G9340 Encephalopathy, unspecified: Secondary | ICD-10-CM | POA: Diagnosis present

## 2014-07-18 DIAGNOSIS — E1140 Type 2 diabetes mellitus with diabetic neuropathy, unspecified: Secondary | ICD-10-CM | POA: Diagnosis present

## 2014-07-18 DIAGNOSIS — K830 Cholangitis: Secondary | ICD-10-CM | POA: Diagnosis present

## 2014-07-18 DIAGNOSIS — C787 Secondary malignant neoplasm of liver and intrahepatic bile duct: Secondary | ICD-10-CM | POA: Diagnosis present

## 2014-07-18 DIAGNOSIS — Z6841 Body Mass Index (BMI) 40.0 and over, adult: Secondary | ICD-10-CM

## 2014-07-18 MED ORDER — IOHEXOL 300 MG/ML  SOLN
20.0000 mL | Freq: Once | INTRAMUSCULAR | Status: AC | PRN
  Administered 2014-07-18: 40 mL

## 2014-07-19 NOTE — Progress Notes (Signed)
Quick Note:  Study reveals no more obstruction of the gallbladder but still some filling BX defect strongly suspicious for stones/sludge.  I think her risk of recurrent cholecystitis is rather high. She did benefit from cholecystectomy. We will try and time this elective laparoscopic situation 6-8 weeks from drain placement = Early August. Would keep drain in until cholecystectomy can be done Make sure she is an acceptable risk for surgery. Increased risk of leak and bleeding with her metastatic carcinoid   Alisha CCS MA, please call and set up appt to discuss surgery.  Thanks,  Adin Hector, M.D., F.A.C.S. Gastrointestinal and Minimally Invasive Surgery [facility] [address], [place], [place] [address] [phone] Main / Paging   ______

## 2014-07-20 ENCOUNTER — Emergency Department (HOSPITAL_COMMUNITY): Payer: Medicare Other

## 2014-07-20 ENCOUNTER — Inpatient Hospital Stay (HOSPITAL_COMMUNITY)
Admission: EM | Admit: 2014-07-20 | Discharge: 2014-08-01 | DRG: 853 | Disposition: A | Discharge status: Home / Self Care | Payer: Medicare Other | Attending: Internal Medicine | Admitting: Internal Medicine

## 2014-07-20 ENCOUNTER — Other Ambulatory Visit: Payer: Self-pay | Admitting: Surgery

## 2014-07-20 ENCOUNTER — Encounter (HOSPITAL_COMMUNITY): Payer: Self-pay | Admitting: Emergency Medicine

## 2014-07-20 DIAGNOSIS — D72829 Elevated white blood cell count, unspecified: Secondary | ICD-10-CM

## 2014-07-20 DIAGNOSIS — R6510 Systemic inflammatory response syndrome (SIRS) of non-infectious origin without acute organ dysfunction: Secondary | ICD-10-CM | POA: Diagnosis present

## 2014-07-20 DIAGNOSIS — R945 Abnormal results of liver function studies: Secondary | ICD-10-CM

## 2014-07-20 DIAGNOSIS — K8309 Other cholangitis: Secondary | ICD-10-CM

## 2014-07-20 DIAGNOSIS — K819 Cholecystitis, unspecified: Secondary | ICD-10-CM | POA: Diagnosis present

## 2014-07-20 DIAGNOSIS — R1011 Right upper quadrant pain: Secondary | ICD-10-CM

## 2014-07-20 DIAGNOSIS — N179 Acute kidney failure, unspecified: Secondary | ICD-10-CM | POA: Diagnosis present

## 2014-07-20 DIAGNOSIS — E669 Obesity, unspecified: Secondary | ICD-10-CM

## 2014-07-20 DIAGNOSIS — Z01811 Encounter for preprocedural respiratory examination: Secondary | ICD-10-CM

## 2014-07-20 DIAGNOSIS — I10 Essential (primary) hypertension: Secondary | ICD-10-CM | POA: Diagnosis present

## 2014-07-20 DIAGNOSIS — C7A00 Malignant carcinoid tumor of unspecified site: Secondary | ICD-10-CM | POA: Diagnosis present

## 2014-07-20 DIAGNOSIS — R7989 Other specified abnormal findings of blood chemistry: Secondary | ICD-10-CM

## 2014-07-20 DIAGNOSIS — E1169 Type 2 diabetes mellitus with other specified complication: Secondary | ICD-10-CM

## 2014-07-20 DIAGNOSIS — R109 Unspecified abdominal pain: Secondary | ICD-10-CM | POA: Diagnosis present

## 2014-07-20 DIAGNOSIS — K567 Ileus, unspecified: Secondary | ICD-10-CM

## 2014-07-20 LAB — URINE MICROSCOPIC-ADD ON

## 2014-07-20 LAB — COMPREHENSIVE METABOLIC PANEL
ALBUMIN: 3.2 g/dL — AB (ref 3.5–5.0)
ALT: 25 U/L (ref 14–54)
AST: 110 U/L — ABNORMAL HIGH (ref 15–41)
Alkaline Phosphatase: 398 U/L — ABNORMAL HIGH (ref 38–126)
Anion gap: 11 (ref 5–15)
BUN: 15 mg/dL (ref 6–20)
CALCIUM: 8.6 mg/dL — AB (ref 8.9–10.3)
CO2: 25 mmol/L (ref 22–32)
Chloride: 96 mmol/L — ABNORMAL LOW (ref 101–111)
Creatinine, Ser: 1.34 mg/dL — ABNORMAL HIGH (ref 0.44–1.00)
GFR calc non Af Amer: 41 mL/min — ABNORMAL LOW (ref >60)
GFR, EST AFRICAN AMERICAN: 47 mL/min — AB (ref >60)
GLUCOSE: 242 mg/dL — AB (ref 65–99)
POTASSIUM: 5.7 mmol/L — AB (ref 3.5–5.1)
SODIUM: 132 mmol/L — AB (ref 135–145)
TOTAL PROTEIN: 8.6 g/dL — AB (ref 6.5–8.1)
Total Bilirubin: 2.7 mg/dL — ABNORMAL HIGH (ref 0.3–1.2)

## 2014-07-20 LAB — URINALYSIS, ROUTINE W REFLEX MICROSCOPIC
Glucose, UA: NEGATIVE mg/dL
HGB URINE DIPSTICK: NEGATIVE
Ketones, ur: NEGATIVE mg/dL
NITRITE: NEGATIVE
Protein, ur: 30 mg/dL — AB
Specific Gravity, Urine: 1.029 (ref 1.005–1.030)
UROBILINOGEN UA: 1 mg/dL (ref 0.0–1.0)
pH: 5.5 (ref 5.0–8.0)

## 2014-07-20 LAB — CBC
HCT: 40.6 % (ref 36.0–46.0)
HEMOGLOBIN: 12.9 g/dL (ref 12.0–15.0)
MCH: 28.9 pg (ref 26.0–34.0)
MCHC: 31.8 g/dL (ref 30.0–36.0)
MCV: 90.8 fL (ref 78.0–100.0)
Platelets: 271 10*3/uL (ref 150–400)
RBC: 4.47 MIL/uL (ref 3.87–5.11)
RDW: 14 % (ref 11.5–15.5)
WBC: 22.9 10*3/uL — AB (ref 4.0–10.5)

## 2014-07-20 LAB — LIPASE, BLOOD: Lipase: <10 U/L — ABNORMAL LOW (ref 22–51)

## 2014-07-20 MED ORDER — IOHEXOL 300 MG/ML  SOLN: 50.0000 mL | Freq: Once | INTRAMUSCULAR | Status: AC | PRN

## 2014-07-20 MED ORDER — CIPROFLOXACIN IN D5W 400 MG/200ML IV SOLN
400.0000 mg | Freq: Once | INTRAVENOUS | Status: AC
  Administered 2014-07-20: 400 mg via INTRAVENOUS
  Filled 2014-07-20: qty 200

## 2014-07-20 MED ORDER — METRONIDAZOLE IN NACL 5-0.79 MG/ML-% IV SOLN
500.0000 mg | Freq: Once | INTRAVENOUS | Status: AC
  Administered 2014-07-21: 500 mg via INTRAVENOUS
  Filled 2014-07-20: qty 100

## 2014-07-20 NOTE — H&P (Signed)
Yesenia Lyons 06/27/2014 3:36 PM Location: [facility] Patient #: [id number] DOB: 1950/10/21 Single / Language: Cleophus Molt / Race: Black or African American Female  History of Present Illness Adin Hector MD; 06/27/2014 4:50 PM) Patient words: reck.  The patient is a 64 year old female who presents for evaluation of gall stones. Patient returns status post hospitalization for acute cholecystitis. Pleasant morbidly obese female with heart failure and diabetes and other issues. 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 Came in in septic shock with mental status changes. Workup suspicious for cholecystitis. Felt to be a poor operative risk. Underwent percutaneous drainage of the gallbladder. Gradually improved. Ultrasound had shown sludge only. She comes today with family member. She can walk with a walker at home but tends to prefer the wheelchair. Denies much pain. Her appetite down little bit but no severe postprandial nausea or vomiting. She is not on blood thinners. No heart attack or stroke. Off all antibiotics.   Other Problems Marjean Donna, CMA; 06/27/2014 3:36 PM) Arthritis Cancer Diabetes Mellitus High blood pressure  Past Surgical History Marjean Donna, CMA; 06/27/2014 3:36 PM) Hysterectomy (not due to cancer) - Complete  Diagnostic Studies History Marjean Donna, CMA; 06/27/2014 3:36 PM) Colonoscopy >10 years ago  Allergies Marjean Donna, CMA; 06/27/2014 3:39 PM) Penicillamine *ASSORTED CLASSES*  Medication History Davy Pique Bynum, CMA; 06/27/2014 3:41 PM) Atenolol (25MG  Tablet, Oral) Active. Furosemide (40MG  Tablet, Oral) Active. Amlodipine Besy-Benazepril HCl (5-20MG  Capsule, Oral) Active. HumaLOG KwikPen (100UNIT/ML Soln Pen-inj, Subcutaneous) Active. HydrALAZINE HCl (50MG  Tablet, Oral) Active. Lantus SoloStar (100UNIT/ML Soln Pen-inj, Subcutaneous) Active. Spironolactone (25MG  Tablet, Oral) Active. Gabapentin (400MG  Capsule, Oral)  Active. Medications Reconciled  Social History Marjean Donna, CMA; 06/27/2014 3:36 PM) Caffeine use Coffee, Tea. No alcohol use No drug use Tobacco use Never smoker.  Family History Marjean Donna, CMA; 06/27/2014 3:36 PM) Family history unknown First Degree Relatives  Pregnancy / Birth History Marjean Donna, [employer]; 06/27/2014 3:36 PM) Age of menopause >59  Review of Systems Davy Pique Bynum CMA; 06/27/2014 3:36 PM) General Present- Appetite Loss and Weight Loss. Not Present- Chills, Fatigue, Fever, Night Sweats and Weight Gain. Skin Present- Dryness. Not Present- Change in Wart/Mole, Hives, Jaundice, New Lesions, Non-Healing Wounds, Rash and Ulcer. HEENT Not Present- Earache, Hearing Loss, Hoarseness, Nose Bleed, Oral Ulcers, Ringing in the Ears, Seasonal Allergies, Sinus Pain, Sore Throat, Visual Disturbances, Wears glasses/contact lenses and Yellow Eyes. Respiratory Not Present- Bloody sputum, Chronic Cough, Difficulty Breathing, Snoring and Wheezing. Breast Not Present- Breast Mass, Breast Pain, Nipple Discharge and Skin Changes. Cardiovascular Present- Swelling of Extremities. Not Present- Chest Pain, Difficulty Breathing Lying Down, Leg Cramps, Palpitations, Rapid Heart Rate and Shortness of Breath. Gastrointestinal Not Present- Abdominal Pain, Bloating, Bloody Stool, Change in Bowel Habits, Chronic diarrhea, Constipation, Difficulty Swallowing, Excessive gas, Gets full quickly at meals, Hemorrhoids, Indigestion, Nausea, Rectal Pain and Vomiting. Female Genitourinary Not Present- Frequency, Nocturia, Painful Urination, Pelvic Pain and Urgency. Musculoskeletal Not Present- Back Pain, Joint Pain, Joint Stiffness, Muscle Pain, Muscle Weakness and Swelling of Extremities. Neurological Present- Numbness. Not Present- Decreased Memory, Fainting, Headaches, Seizures, Tingling, Tremor, Trouble walking and Weakness. Psychiatric Not Present- Anxiety, Bipolar, Change in Sleep Pattern, Depression,  Fearful and Frequent crying. Endocrine Not Present- Cold Intolerance, Excessive Hunger, Hair Changes, Heat Intolerance, Hot flashes and New Diabetes. Hematology Not Present- Easy Bruising, Excessive bleeding, Gland problems, HIV and Persistent Infections.   Vitals (Sonya Bynum CMA; 06/27/2014 3:38 PM) 06/27/2014 3:38 PM Weight: 330 lb Height: 70in Body Surface Area: 2.72 m  Body Mass Index: 47.35 kg/m Temp.: 61F(Temporal)  Pulse: 79 (Regular)  BP: 138/81 (Sitting, Left Arm, Standard)    Physical Exam Adin Hector MD; 06/27/2014 4:52 PM) General Mental Status-Alert. General Appearance-Not in acute distress. Voice-Normal.  Integumentary Global Assessment Upon inspection and palpation of skin surfaces of the - Distribution of scalp and body hair is normal. General Characteristics Overall examination of the patient's skin reveals - no rashes and no suspicious lesions.  Head and Neck Head-normocephalic, atraumatic with no lesions or palpable masses. Face Global Assessment - atraumatic, no absence of expression. Neck Global Assessment - no abnormal movements, no decreased range of motion. Trachea-midline. Thyroid Gland Characteristics - non-tender.  Eye Eyeball - Left-Extraocular movements intact, No Nystagmus. Eyeball - Right-Extraocular movements intact, No Nystagmus. Upper Eyelid - Left-No Cyanotic. Upper Eyelid - Right-No Cyanotic.  Chest and Lung Exam Inspection Accessory muscles - No use of accessory muscles in breathing.  Abdomen Note: Morbidly obese but soft. Percutaneous drain in RIGHT upper quadrant with clear bilious output. No peritonitis.   Female Genitourinary Note: No inguinal hernias.   Peripheral Vascular Upper Extremity Inspection - Left - Not Gangrenous, No Petechiae. Right - Not Gangrenous, No Petechiae.  Neurologic Neurologic evaluation reveals -normal attention span and ability to concentrate, able to  name objects and repeat phrases. Appropriate fund of knowledge and normal coordination.  Neuropsychiatric Mental status exam performed with findings of-able to articulate well with normal speech/language, rate, volume and coherence and no evidence of hallucinations, delusions, obsessions or homicidal/suicidal ideation. Orientation-oriented X3.  Musculoskeletal Global Assessment Gait and Station - normal gait and station. Note: Significant edema bilateral lower extremities.   Lymphatic General Lymphatics Description - No Generalized lymphadenopathy.    Assessment & Plan Adin Hector MD; 06/27/2014 4:52 PM) ACUTE CHOLECYSTITIS (575.0  K81.0) Impression: Morbidly obese female with numerous comorbidities status post percutaneous drainage of gallbladder for acute cholecystitis with sludge only.  We'll plan cholangiogram at the four-week point. Early July. If that shows no obstruction and no stones, remove drain and follow-up as needed. If she has stones and/or evidence of persistent biliary obstruction, plan laparoscopic cholecystectomy. Most likely 4 port. procedure dome down given morbid obesity and liver full of carcinoid tumor. Collector try to avoid this but risk of recurrent cholecystitis elevated if she has persistent obstruction and/or significant stones. Risks of surgery elevated but improved from when she was in the ICU and shock. Concern with significant carcinoid burden in the liver. She is open to recommendations. Current Plans  Pt Education - [facility] Good Bowel Health (Tima Curet) Pt Education - [facility] Pain Control (Eneida Evers) Pt Education - Cholecystitis: cholecystitis   Signed by Adin Hector, MD (06/27/2014 4:53 PM)  Adin Hector, M.D., F.A.C.S. Gastrointestinal and Minimally Invasive Surgery [facility] [address] [place], [place] [address] [phone] Main / Paging

## 2014-07-20 NOTE — ED Provider Notes (Signed)
CSN: [id number]     Arrival date & time 07/20/14  1643 History   First MD Initiated Contact with Patient 07/20/14 2000     Chief Complaint  Patient presents with  . Post-op Problem      HPI Patient underwent placement of a cholecystostomy tube for possible cholecystitis at the early part of June 2016.  At that time she was also noted to have carcinoid tumor with metastases to the liver.  She's been improving and doing better however she underwent cholangiogram 2 days ago and has developed worsening right upper quadrant abdominal pain since the cholangiogram.  I contacted the general surgery office and was recommended that she come to the ER for evaluation.  Plan at time of cholangiogram was that if it demonstrated ongoing sludge or stones the patient would likely undergo cholecystectomy.  Patient was low-grade fever today.  She reports nausea without vomiting.  She denies diarrhea.   Past Medical History  Diagnosis Date  . Pneumonia   . Renal disorder     renal failure, was on HD for a period of time  . Obesity   . Hypertension   . Diabetes mellitus   . High cholesterol   . CHF (congestive heart failure)   . 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 Abdominal hysterectomy    . Knee reconstruction, medial patellar femoral ligament     No family history on file. History  Substance Use Topics  . Smoking status: Never Smoker   . Smokeless tobacco: Never Used  . Alcohol Use: No   OB History    No data available     Review of Systems  All other systems reviewed and are negative.     Allergies  Penicillins  Home Medications   Prior to Admission medications   Medication Sig Start Date End Date Taking? Authorizing Provider  albuterol (PROVENTIL HFA;VENTOLIN HFA) 108 (90 BASE) MCG/ACT inhaler Inhale 2 puffs into the lungs every 6 (six) hours as needed for wheezing (wheezing).    Yes Historical Provider, MD   amLODipine-benazepril (LOTREL) 5-10 MG per capsule Take 1 capsule by mouth daily.   Yes Historical Provider, MD  beclomethasone (QVAR) 80 MCG/ACT inhaler Inhale 1 puff into the lungs as needed (sob). breathing   Yes Historical Provider, MD  feeding supplement, ENSURE COMPLETE, (ENSURE COMPLETE) LIQD Take 237 mLs by mouth 2 (two) times daily between meals. Patient taking differently: Take 237 mLs by mouth daily as needed (nutrition).  04/01/13  Yes Nishant Dhungel, MD  ferrous gluconate (FERGON) 324 MG tablet Take 324 mg by mouth 2 (two) times daily with a meal.   Yes Historical Provider, MD  furosemide (LASIX) 40 MG tablet Take 1 tablet (40 mg total) by mouth daily. 10/29/11  Yes Sheryle Hail, NP  gabapentin (NEURONTIN) 400 MG capsule Take 1 capsule (400 mg total) by mouth 3 (three) times daily. 06/11/14  Yes Janece Canterbury, MD  Ginger, Zingiber officinalis, (GINGER ROOT) 550 MG CAPS Take 1 capsule by mouth daily as needed (nutrition).    Yes Historical Provider, MD  insulin glargine (LANTUS) 100 UNIT/ML injection Inject 0.1 mLs (10 Units total) into the skin 2 (two) times daily. In the morning and at bedtime. 06/11/14  Yes Janece Canterbury, MD  ondansetron (ZOFRAN ODT) 4 MG disintegrating tablet Take 1 tablet (4 mg total) by mouth every 8 (eight) hours as needed for nausea or vomiting.  08/28/13  Yes Velvet Bathe, MD  oxyCODONE-acetaminophen (PERCOCET) 10-325 MG per tablet Take 1 tablet by mouth every 8 (eight) hours as needed for pain. 03/03/14  Yes Truitt Merle, MD  saccharomyces boulardii (FLORASTOR) 250 MG capsule Take 1 capsule (250 mg total) by mouth 2 (two) times daily. 06/11/14  Yes Janece Canterbury, MD  spironolactone (ALDACTONE) 25 MG tablet Take 25 mg by mouth daily.   Yes Historical Provider, MD  vitamin C (ASCORBIC ACID) 500 MG tablet Take 1 tablet (500 mg total) by mouth daily. 09/02/13  Yes Aasim Lona Kettle, MD  levofloxacin (LEVAQUIN) 750 MG tablet Take 1 tablet (750 mg total) by mouth  daily. Patient not taking: Reported on 07/20/2014 06/12/14   Janece Canterbury, MD   BP 122/64 mmHg  Pulse 70  Temp(Src) 99 F (37.2 C) (Oral)  Resp 18  SpO2 99% Physical Exam  Constitutional: She is oriented to person, place, and time. She appears well-developed and well-nourished. No distress.  HENT:  Head: Normocephalic and atraumatic.  Eyes: EOM are normal.  Neck: Normal range of motion.  Cardiovascular: Normal rate, regular rhythm and normal heart sounds.   Pulmonary/Chest: Effort normal and breath sounds normal.  Abdominal: Soft. She exhibits no distension.  Tenderness in the right upper quadrant.  Cholecystostomy tube and right upper quadrant.  Biliary drainage noted.   Musculoskeletal: Normal range of motion.  Neurological: She is alert and oriented to person, place, and time.  Skin: Skin is warm and dry.  Psychiatric: She has a normal mood and affect. Judgment normal.  Nursing note and vitals reviewed.   ED Course  Procedures (including critical care time) Labs Review Labs Reviewed  LIPASE, BLOOD - Abnormal; Notable for the following:    Lipase <10 (*)    All other components within normal limits  COMPREHENSIVE METABOLIC PANEL - Abnormal; Notable for the following:    Sodium 132 (*)    Potassium 5.7 (*)    Chloride 96 (*)    Glucose, Bld 242 (*)    Creatinine, Ser 1.34 (*)    Calcium 8.6 (*)    Total Protein 8.6 (*)    Albumin 3.2 (*)    AST 110 (*)    Alkaline Phosphatase 398 (*)    Total Bilirubin 2.7 (*)    GFR calc non Af Amer 41 (*)    GFR calc Af Amer 47 (*)    All other components within normal limits  CBC - Abnormal; Notable for the following:    WBC 22.9 (*)    All other components within normal limits  URINALYSIS, ROUTINE W REFLEX MICROSCOPIC (NOT AT [facility]) - Abnormal; Notable for the following:    Color, Urine ORANGE (*)    APPearance CLOUDY (*)    Bilirubin Urine MODERATE (*)    Protein, ur 30 (*)    Leukocytes, UA SMALL (*)    All other  components within normal limits  URINE MICROSCOPIC-ADD ON - Abnormal; Notable for the following:    Squamous Epithelial / LPF MANY (*)    Bacteria, UA FEW (*)    Casts HYALINE CASTS (*)    All other components within normal limits    Imaging Review US Abdomen Limited  07/20/2014   CLINICAL DATA:  Right upper quadrant pain after surgery on Monday. Gallbladder drain.  EXAM: US ABDOMEN LIMITED - RIGHT UPPER QUADRANT  COMPARISON:  CT abdomen and pelvis 06/01/2014  FINDINGS: Examination is technically limited due to patient's body habitus and bowel gas.  Gallbladder:  Gallbladder is not visualized. It is likely decompressed with gallbladder drain in place.  Common bile duct:  Diameter: 2.5 mm, normal  Liver:  Diffusely heterogeneous hepatic parenchymal echotexture with nodular contour. Specific lesions are poorly defined, but the appearance is consistent with known hepatic metastases.  IMPRESSION: Gallbladder is not visualized and is likely decompressed. No bile duct dilatation. Heterogeneous parenchymal appearance of the liver consistent with known metastasis.   Electronically Signed   By: Lucienne Capers M.D.   On: 07/20/2014 23:14  I personally reviewed the imaging tests through PACS system I reviewed available ER/hospitalization records through the EMR    EKG Interpretation None      MDM   Final diagnoses:  None   Elevated white blood cell count.  Examination concerning for cholecystitis.  Initially had a ultrasound was obtained however this only demonstrates a decompressed gallbladder.  Patient will need to move on the CT scan. Care to Dr Claudine Mouton.     Jola Schmidt, MD 07/21/14 [phone]

## 2014-07-20 NOTE — ED Notes (Signed)
Pt's family member states that pt needs gallbladder surgery and had a "foley bag placed".  C/o severe abd pain.

## 2014-07-20 NOTE — ED Notes (Signed)
I attempted to collect labs and was unsuccessful.  Daughter stated they have to call the IV team for her.

## 2014-07-21 ENCOUNTER — Encounter (HOSPITAL_COMMUNITY): Payer: Self-pay | Admitting: Radiology

## 2014-07-21 DIAGNOSIS — Z6841 Body Mass Index (BMI) 40.0 and over, adult: Secondary | ICD-10-CM | POA: Diagnosis not present

## 2014-07-21 DIAGNOSIS — E1140 Type 2 diabetes mellitus with diabetic neuropathy, unspecified: Secondary | ICD-10-CM | POA: Diagnosis present

## 2014-07-21 DIAGNOSIS — S37009A Unspecified injury of unspecified kidney, initial encounter: Secondary | ICD-10-CM

## 2014-07-21 DIAGNOSIS — I10 Essential (primary) hypertension: Secondary | ICD-10-CM

## 2014-07-21 DIAGNOSIS — E876 Hypokalemia: Secondary | ICD-10-CM | POA: Diagnosis not present

## 2014-07-21 DIAGNOSIS — G9340 Encephalopathy, unspecified: Secondary | ICD-10-CM

## 2014-07-21 DIAGNOSIS — I5032 Chronic diastolic (congestive) heart failure: Secondary | ICD-10-CM | POA: Diagnosis present

## 2014-07-21 DIAGNOSIS — E669 Obesity, unspecified: Secondary | ICD-10-CM | POA: Diagnosis not present

## 2014-07-21 DIAGNOSIS — E875 Hyperkalemia: Secondary | ICD-10-CM | POA: Diagnosis present

## 2014-07-21 DIAGNOSIS — C7A00 Malignant carcinoid tumor of unspecified site: Secondary | ICD-10-CM

## 2014-07-21 DIAGNOSIS — C7B02 Secondary carcinoid tumors of liver: Secondary | ICD-10-CM | POA: Diagnosis not present

## 2014-07-21 DIAGNOSIS — R109 Unspecified abdominal pain: Secondary | ICD-10-CM

## 2014-07-21 DIAGNOSIS — K8010 Calculus of gallbladder with chronic cholecystitis without obstruction: Secondary | ICD-10-CM | POA: Diagnosis present

## 2014-07-21 DIAGNOSIS — E1165 Type 2 diabetes mellitus with hyperglycemia: Secondary | ICD-10-CM | POA: Diagnosis present

## 2014-07-21 DIAGNOSIS — R509 Fever, unspecified: Secondary | ICD-10-CM

## 2014-07-21 DIAGNOSIS — A419 Sepsis, unspecified organism: Secondary | ICD-10-CM | POA: Diagnosis present

## 2014-07-21 DIAGNOSIS — E119 Type 2 diabetes mellitus without complications: Secondary | ICD-10-CM

## 2014-07-21 DIAGNOSIS — K830 Cholangitis: Secondary | ICD-10-CM | POA: Diagnosis not present

## 2014-07-21 DIAGNOSIS — R1011 Right upper quadrant pain: Secondary | ICD-10-CM | POA: Diagnosis present

## 2014-07-21 DIAGNOSIS — C7B09 Secondary carcinoid tumors of other sites: Secondary | ICD-10-CM

## 2014-07-21 DIAGNOSIS — E780 Pure hypercholesterolemia: Secondary | ICD-10-CM | POA: Diagnosis present

## 2014-07-21 DIAGNOSIS — C787 Secondary malignant neoplasm of liver and intrahepatic bile duct: Secondary | ICD-10-CM | POA: Diagnosis present

## 2014-07-21 DIAGNOSIS — K819 Cholecystitis, unspecified: Secondary | ICD-10-CM | POA: Diagnosis not present

## 2014-07-21 DIAGNOSIS — E43 Unspecified severe protein-calorie malnutrition: Secondary | ICD-10-CM | POA: Diagnosis present

## 2014-07-21 DIAGNOSIS — N179 Acute kidney failure, unspecified: Secondary | ICD-10-CM | POA: Diagnosis present

## 2014-07-21 DIAGNOSIS — Z79891 Long term (current) use of opiate analgesic: Secondary | ICD-10-CM | POA: Diagnosis not present

## 2014-07-21 DIAGNOSIS — D72829 Elevated white blood cell count, unspecified: Secondary | ICD-10-CM | POA: Diagnosis present

## 2014-07-21 DIAGNOSIS — Z794 Long term (current) use of insulin: Secondary | ICD-10-CM | POA: Diagnosis not present

## 2014-07-21 DIAGNOSIS — Z880 Allergy status to penicillin: Secondary | ICD-10-CM | POA: Diagnosis not present

## 2014-07-21 DIAGNOSIS — K811 Chronic cholecystitis: Secondary | ICD-10-CM | POA: Diagnosis not present

## 2014-07-21 DIAGNOSIS — E860 Dehydration: Secondary | ICD-10-CM | POA: Diagnosis present

## 2014-07-21 DIAGNOSIS — Z79899 Other long term (current) drug therapy: Secondary | ICD-10-CM | POA: Diagnosis not present

## 2014-07-21 DIAGNOSIS — B957 Other staphylococcus as the cause of diseases classified elsewhere: Secondary | ICD-10-CM | POA: Diagnosis not present

## 2014-07-21 LAB — LACTIC ACID, PLASMA
LACTIC ACID, VENOUS: 1.2 mmol/L (ref 0.5–2.0)
Lactic Acid, Venous: 1.7 mmol/L (ref 0.5–2.0)
Lactic Acid, Venous: 3.1 mmol/L (ref 0.5–2.0)
Lactic Acid, Venous: 2.3 mmol/L (ref 0.5–2.0)

## 2014-07-21 LAB — CBC WITH DIFFERENTIAL/PLATELET
BASOS ABS: 0 10*3/uL (ref 0.0–0.1)
Basophils Relative: 0 % (ref 0–1)
Eosinophils Absolute: 0 10*3/uL (ref 0.0–0.7)
Eosinophils Relative: 0 % (ref 0–5)
HEMATOCRIT: 37.4 % (ref 36.0–46.0)
Hemoglobin: 11.8 g/dL — ABNORMAL LOW (ref 12.0–15.0)
LYMPHS ABS: 1.3 10*3/uL (ref 0.7–4.0)
Lymphocytes Relative: 6 % — ABNORMAL LOW (ref 12–46)
MCH: 28.4 pg (ref 26.0–34.0)
MCHC: 31.6 g/dL (ref 30.0–36.0)
MCV: 90.1 fL (ref 78.0–100.0)
MONO ABS: 3.6 10*3/uL — AB (ref 0.1–1.0)
Monocytes Relative: 18 % — ABNORMAL HIGH (ref 3–12)
NEUTROS ABS: 15.4 10*3/uL — AB (ref 1.7–7.7)
Neutrophils Relative %: 76 % (ref 43–77)
Platelets: 260 10*3/uL (ref 150–400)
RBC: 4.15 MIL/uL (ref 3.87–5.11)
RDW: 13.9 % (ref 11.5–15.5)
WBC: 20.3 10*3/uL — ABNORMAL HIGH (ref 4.0–10.5)

## 2014-07-21 LAB — HEPATIC FUNCTION PANEL
ALBUMIN: 2.7 g/dL — AB (ref 3.5–5.0)
ALT: 21 U/L (ref 14–54)
AST: 54 U/L — AB (ref 15–41)
Alkaline Phosphatase: 330 U/L — ABNORMAL HIGH (ref 38–126)
Bilirubin, Direct: 0.8 mg/dL — ABNORMAL HIGH (ref 0.1–0.5)
Indirect Bilirubin: 1.3 mg/dL — ABNORMAL HIGH (ref 0.3–0.9)
Total Bilirubin: 2.1 mg/dL — ABNORMAL HIGH (ref 0.3–1.2)
Total Protein: 7.6 g/dL (ref 6.5–8.1)

## 2014-07-21 LAB — BASIC METABOLIC PANEL
Anion gap: 9 (ref 5–15)
BUN: 16 mg/dL (ref 6–20)
CO2: 26 mmol/L (ref 22–32)
CREATININE: 1.16 mg/dL — AB (ref 0.44–1.00)
Calcium: 8 mg/dL — ABNORMAL LOW (ref 8.9–10.3)
Chloride: 99 mmol/L — ABNORMAL LOW (ref 101–111)
GFR calc non Af Amer: 48 mL/min — ABNORMAL LOW (ref >60)
GFR, EST AFRICAN AMERICAN: 56 mL/min — AB (ref >60)
GLUCOSE: 242 mg/dL — AB (ref 65–99)
POTASSIUM: 3.6 mmol/L (ref 3.5–5.1)
Sodium: 134 mmol/L — ABNORMAL LOW (ref 135–145)

## 2014-07-21 LAB — GLUCOSE, CAPILLARY
GLUCOSE-CAPILLARY: 160 mg/dL — AB (ref 65–99)
Glucose-Capillary: 199 mg/dL — ABNORMAL HIGH (ref 65–99)
Glucose-Capillary: 203 mg/dL — ABNORMAL HIGH (ref 65–99)
Glucose-Capillary: 176 mg/dL — ABNORMAL HIGH (ref 65–99)
Glucose-Capillary: 178 mg/dL — ABNORMAL HIGH (ref 65–99)

## 2014-07-21 LAB — PROCALCITONIN: PROCALCITONIN: 3.45 ng/mL

## 2014-07-21 MED ORDER — SODIUM CHLORIDE 0.9 % IV BOLUS (SEPSIS)
500.0000 mL | Freq: Once | INTRAVENOUS | Status: AC
  Administered 2014-07-21: 500 mL via INTRAVENOUS

## 2014-07-21 MED ORDER — MORPHINE SULFATE 4 MG/ML IJ SOLN
6.0000 mg | Freq: Once | INTRAMUSCULAR | Status: AC
  Administered 2014-07-21: 6 mg via INTRAVENOUS
  Filled 2014-07-21: qty 2

## 2014-07-21 MED ORDER — INSULIN GLARGINE 100 UNIT/ML ~~LOC~~ SOLN
10.0000 [IU] | Freq: Two times a day (BID) | SUBCUTANEOUS | Status: DC
  Administered 2014-07-21: 10 [IU] via SUBCUTANEOUS
  Filled 2014-07-21 (×2): qty 0.1

## 2014-07-21 MED ORDER — ACETAMINOPHEN 650 MG RE SUPP: 650.0000 mg | Freq: Four times a day (QID) | RECTAL | Status: DC | PRN

## 2014-07-21 MED ORDER — ALBUTEROL SULFATE (2.5 MG/3ML) 0.083% IN NEBU: 2.5000 mg | INHALATION_SOLUTION | Freq: Four times a day (QID) | RESPIRATORY_TRACT | Status: DC | PRN

## 2014-07-21 MED ORDER — SODIUM CHLORIDE 0.9 % IV BOLUS (SEPSIS)
2000.0000 mL | Freq: Once | INTRAVENOUS | Status: AC
  Administered 2014-07-21: 2000 mL via INTRAVENOUS

## 2014-07-21 MED ORDER — SODIUM CHLORIDE 0.9 % IV SOLN
500.0000 mg | Freq: Three times a day (TID) | INTRAVENOUS | Status: DC
  Filled 2014-07-21: qty 500

## 2014-07-21 MED ORDER — INSULIN ASPART 100 UNIT/ML ~~LOC~~ SOLN
0.0000 [IU] | SUBCUTANEOUS | Status: DC
  Administered 2014-07-21 (×2): 2 [IU] via SUBCUTANEOUS
  Administered 2014-07-21: 3 [IU] via SUBCUTANEOUS
  Administered 2014-07-21: 2 [IU] via SUBCUTANEOUS
  Administered 2014-07-22 (×2): 1 [IU] via SUBCUTANEOUS
  Administered 2014-07-22: 2 [IU] via SUBCUTANEOUS
  Administered 2014-07-22: 1 [IU] via SUBCUTANEOUS
  Administered 2014-07-22: 2 [IU] via SUBCUTANEOUS
  Administered 2014-07-23: 1 [IU] via SUBCUTANEOUS
  Administered 2014-07-23: 3 [IU] via SUBCUTANEOUS
  Administered 2014-07-23: 5 [IU] via SUBCUTANEOUS
  Administered 2014-07-23 – 2014-07-24 (×3): 1 [IU] via SUBCUTANEOUS
  Administered 2014-07-24: 3 [IU] via SUBCUTANEOUS
  Administered 2014-07-24 (×2): 1 [IU] via SUBCUTANEOUS

## 2014-07-21 MED ORDER — ONDANSETRON HCL 4 MG PO TABS: 4.0000 mg | ORAL_TABLET | Freq: Four times a day (QID) | ORAL | Status: DC | PRN

## 2014-07-21 MED ORDER — BECLOMETHASONE DIPROPIONATE 80 MCG/ACT IN AERS: 1.0000 | INHALATION_SPRAY | RESPIRATORY_TRACT | Status: DC | PRN

## 2014-07-21 MED ORDER — INSULIN GLARGINE 100 UNIT/ML ~~LOC~~ SOLN: 10.0000 [IU] | Freq: Every day | SUBCUTANEOUS | Status: DC

## 2014-07-21 MED ORDER — IOHEXOL 300 MG/ML  SOLN
100.0000 mL | Freq: Once | INTRAMUSCULAR | Status: AC | PRN
Start: 2014-07-21 — End: 2014-07-21
  Administered 2014-07-21: 100 mL via INTRAVENOUS

## 2014-07-21 MED ORDER — CIPROFLOXACIN IN D5W 400 MG/200ML IV SOLN
400.0000 mg | Freq: Two times a day (BID) | INTRAVENOUS | Status: DC
  Filled 2014-07-21: qty 200

## 2014-07-21 MED ORDER — GABAPENTIN 400 MG PO CAPS
400.0000 mg | ORAL_CAPSULE | Freq: Three times a day (TID) | ORAL | Status: DC
  Administered 2014-07-21 – 2014-08-01 (×33): 400 mg via ORAL
  Filled 2014-07-21 (×39): qty 1

## 2014-07-21 MED ORDER — SACCHAROMYCES BOULARDII 250 MG PO CAPS
250.0000 mg | ORAL_CAPSULE | Freq: Two times a day (BID) | ORAL | Status: DC
  Administered 2014-07-21 – 2014-08-01 (×22): 250 mg via ORAL
  Filled 2014-07-21 (×24): qty 1

## 2014-07-21 MED ORDER — INSULIN GLARGINE 100 UNIT/ML ~~LOC~~ SOLN
10.0000 [IU] | Freq: Two times a day (BID) | SUBCUTANEOUS | Status: DC
  Administered 2014-07-21 – 2014-07-22 (×2): 10 [IU] via SUBCUTANEOUS
  Filled 2014-07-21 (×3): qty 0.1

## 2014-07-21 MED ORDER — METRONIDAZOLE IN NACL 5-0.79 MG/ML-% IV SOLN
500.0000 mg | Freq: Three times a day (TID) | INTRAVENOUS | Status: DC
  Administered 2014-07-21: 500 mg via INTRAVENOUS
  Filled 2014-07-21 (×2): qty 100

## 2014-07-21 MED ORDER — SODIUM CHLORIDE 0.9 % IV SOLN
INTRAVENOUS | Status: AC
Start: 2014-07-21 — End: 2014-07-22
  Administered 2014-07-21: 17:00:00 via INTRAVENOUS
  Administered 2014-07-22: 1000 mL via INTRAVENOUS

## 2014-07-21 MED ORDER — ACETAMINOPHEN 325 MG PO TABS: 650.0000 mg | ORAL_TABLET | Freq: Four times a day (QID) | ORAL | Status: DC | PRN

## 2014-07-21 MED ORDER — BUDESONIDE 0.25 MG/2ML IN SUSP: 0.2500 mg | Freq: Every day | RESPIRATORY_TRACT | Status: DC | PRN

## 2014-07-21 MED ORDER — ALBUTEROL SULFATE HFA 108 (90 BASE) MCG/ACT IN AERS: 2.0000 | INHALATION_SPRAY | Freq: Four times a day (QID) | RESPIRATORY_TRACT | Status: DC | PRN

## 2014-07-21 MED ORDER — SODIUM CHLORIDE 0.9 % IV SOLN
500.0000 mg | Freq: Three times a day (TID) | INTRAVENOUS | Status: DC
  Administered 2014-07-21 – 2014-07-25 (×13): 500 mg via INTRAVENOUS
  Filled 2014-07-21 (×14): qty 500

## 2014-07-21 MED ORDER — SODIUM CHLORIDE 0.9 % IV BOLUS (SEPSIS)
1000.0000 mL | Freq: Once | INTRAVENOUS | Status: AC
  Administered 2014-07-21: 1000 mL via INTRAVENOUS

## 2014-07-21 MED ORDER — HEPARIN SODIUM (PORCINE) 5000 UNIT/ML IJ SOLN
5000.0000 [IU] | Freq: Three times a day (TID) | INTRAMUSCULAR | Status: DC
  Administered 2014-07-21 – 2014-07-26 (×14): 5000 [IU] via SUBCUTANEOUS
  Filled 2014-07-21 (×17): qty 1

## 2014-07-21 MED ORDER — SODIUM CHLORIDE 0.9 % IJ SOLN
3.0000 mL | Freq: Two times a day (BID) | INTRAMUSCULAR | Status: DC
  Administered 2014-07-23 – 2014-07-29 (×5): 3 mL via INTRAVENOUS

## 2014-07-21 MED ORDER — ONDANSETRON HCL 4 MG/2ML IJ SOLN
4.0000 mg | Freq: Four times a day (QID) | INTRAMUSCULAR | Status: DC | PRN
  Administered 2014-07-30: 4 mg via INTRAVENOUS
  Filled 2014-07-21: qty 2

## 2014-07-21 NOTE — ED Provider Notes (Signed)
I was signed out this patient by Dr. Venora Maples is pending CT scan for evaluation of gallbladder. CT scan is negative for biliary pathology. When discussing results with patient, family still high concern of dark-colored urine. I spoke with Dr. Johney Maine who is caring for the patient and he recommends for medical admission with gastroenterology consultation. He does not believe this is the gallbladder. He does have concern for leukocytosis, hyperbilirubinemia, and right upper quadrant pain. Dr. Jabier Mutton with [facility] hospitalist has agreed to admit the patient for further care.  Everlene Balls, MD 07/21/14 [phone]

## 2014-07-21 NOTE — H&P (Signed)
[facility] History and Physical  Tonilynn Bieker ZOX:[id number] DOB: 1950/07/23 DOA: 07/20/2014  Referring physician: Dr.Oni. PCP: Isaias Cowman, PA-C  Specialists: Dr.Feng. Oncologist.  Chief Complaint: Right upper quadrant pain.  HPI: Yesenia Lyons is a 64 y.o. female with history of sepsis secondary to cholecystitis and has had cholecystostomy and had discharged on 06/11/2014 and had cholangiogram done on July 11 which showed persistent opacities for which drain was still continued presents to the ER because of right upper quadrant pain worsening over the last 3 days with nausea. Patient also had one episode of diarrhea. In the ER CT abdomen and pelvis and sonogram of the abdomen done by unremarkable. Patient's LFTs has worsened from previous. ER physician had discussed with Dr. Johney Maine, general surgeon over this time will be seeing patient in consult but feels that patient's fever and pain may be related to something else other than gallbladder. Patient has been started on empiric antibiotics after blood cultures were obtained and admitted for further management. Patient denies any chest pain or shortness of breath.   Review of Systems: As presented in the history of presenting illness, rest negative.  Past Medical History  Diagnosis Date  . Pneumonia   . Renal disorder     renal failure, was on HD for a period of time  . Obesity   . Hypertension   . Diabetes mellitus   . High cholesterol   . CHF (congestive heart failure)   . 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 Abdominal hysterectomy    . Knee reconstruction, medial patellar femoral ligament     Social History:  reports that she has never smoked. She has never used smokeless tobacco. She reports that she does not drink alcohol or use illicit drugs. Where does patient live home. Can patient participate in ADLs? Yes.  Allergies  Allergen Reactions  . Penicillins Itching  and Swelling    Family History: History reviewed. No pertinent family history.    Prior to Admission medications   Medication Sig Start Date End Date Taking? Authorizing Provider  albuterol (PROVENTIL HFA;VENTOLIN HFA) 108 (90 BASE) MCG/ACT inhaler Inhale 2 puffs into the lungs every 6 (six) hours as needed for wheezing (wheezing).    Yes Historical Provider, MD  amLODipine-benazepril (LOTREL) 5-10 MG per capsule Take 1 capsule by mouth daily.   Yes Historical Provider, MD  beclomethasone (QVAR) 80 MCG/ACT inhaler Inhale 1 puff into the lungs as needed (sob). breathing   Yes Historical Provider, MD  feeding supplement, ENSURE COMPLETE, (ENSURE COMPLETE) LIQD Take 237 mLs by mouth 2 (two) times daily between meals. Patient taking differently: Take 237 mLs by mouth daily as needed (nutrition).  04/01/13  Yes Nishant Dhungel, MD  ferrous gluconate (FERGON) 324 MG tablet Take 324 mg by mouth 2 (two) times daily with a meal.   Yes Historical Provider, MD  furosemide (LASIX) 40 MG tablet Take 1 tablet (40 mg total) by mouth daily. 10/29/11  Yes Sheryle Hail, NP  gabapentin (NEURONTIN) 400 MG capsule Take 1 capsule (400 mg total) by mouth 3 (three) times daily. 06/11/14  Yes Janece Canterbury, MD  Ginger, Zingiber officinalis, (GINGER ROOT) 550 MG CAPS Take 1 capsule by mouth daily as needed (nutrition).    Yes Historical Provider, MD  insulin glargine (LANTUS) 100 UNIT/ML injection Inject 0.1 mLs (10 Units total) into the skin 2 (two) times daily. In the morning and at  bedtime. 06/11/14  Yes Janece Canterbury, MD  ondansetron (ZOFRAN ODT) 4 MG disintegrating tablet Take 1 tablet (4 mg total) by mouth every 8 (eight) hours as needed for nausea or vomiting. 08/28/13  Yes Velvet Bathe, MD  oxyCODONE-acetaminophen (PERCOCET) 10-325 MG per tablet Take 1 tablet by mouth every 8 (eight) hours as needed for pain. 03/03/14  Yes Truitt Merle, MD  saccharomyces boulardii (FLORASTOR) 250 MG capsule Take 1 capsule (250 mg  total) by mouth 2 (two) times daily. 06/11/14  Yes Janece Canterbury, MD  spironolactone (ALDACTONE) 25 MG tablet Take 25 mg by mouth daily.   Yes Historical Provider, MD  vitamin C (ASCORBIC ACID) 500 MG tablet Take 1 tablet (500 mg total) by mouth daily. 09/02/13  Yes Aasim Lona Kettle, MD  levofloxacin (LEVAQUIN) 750 MG tablet Take 1 tablet (750 mg total) by mouth daily. Patient not taking: Reported on 07/20/2014 06/12/14   Janece Canterbury, MD    Physical Exam: Filed Vitals:   07/20/14 2200 07/20/14 2233 07/21/14 0048 07/21/14 0417  BP: 124/65 122/64 117/73 102/62  Pulse:  70 89 94  Temp:   98.7 F (37.1 C)   TempSrc:   Oral   Resp:  18 20 18   SpO2:  99% 100% 95%     General:  Moderately built and nourished.  Eyes: Anicteric no pallor.  ENT: No discharge from the ears eyes nose and mouth.  Neck: No mass felt.  Cardiovascular: S1 and S2 heard.  Respiratory: No rhonchi or crepitations.  Abdomen: Soft nontender bowel sounds present.  Skin: No rash.  Musculoskeletal: No edema.  Psychiatric: Appears normal.  Neurologic: Alert awake oriented to time place and person. Moves all extremities.  Labs on Admission:  Basic Metabolic Panel:  Recent Labs Lab 07/20/14 2103  NA 132*  K 5.7*  CL 96*  CO2 25  GLUCOSE 242*  BUN 15  CREATININE 1.34*  CALCIUM 8.6*   Liver Function Tests:  Recent Labs Lab 07/20/14 2103  AST 110*  ALT 25  ALKPHOS 398*  BILITOT 2.7*  PROT 8.6*  ALBUMIN 3.2*    Recent Labs Lab 07/20/14 2103  LIPASE <10*   No results for input(s): AMMONIA in the last 168 hours. CBC:  Recent Labs Lab 07/20/14 2103  WBC 22.9*  HGB 12.9  HCT 40.6  MCV 90.8  PLT 271   Cardiac Enzymes: No results for input(s): CKTOTAL, CKMB, CKMBINDEX, TROPONINI in the last 168 hours.  BNP (last 3 results) No results for input(s): BNP in the last 8760 hours.  ProBNP (last 3 results) No results for input(s): PROBNP in the last 8760 hours.  CBG: No results for  input(s): GLUCAP in the last 168 hours.  Radiological Exams on Admission: Ct Abdomen Pelvis W Contrast  07/21/2014   CLINICAL DATA:  64 year old female with upper abdominal pain and cholecystostomy tube.  EXAM: CT ABDOMEN AND PELVIS WITH CONTRAST  TECHNIQUE: Multidetector CT imaging of the abdomen and pelvis was performed using the standard protocol following bolus administration of intravenous contrast.  CONTRAST:  147mL OMNIPAQUE IOHEXOL 300 MG/ML  SOLN  COMPARISON:  CT dated 12/27/2013 and ultrasound dated 07/20/2014 appearance  FINDINGS: Bibasilar linear atelectasis/ scarring. Top-normal cardiac size. No intra-abdominal free air. Small perihepatic ascites.  There are innumerable hepatic hypodense lesions compatible with metastatic disease. Direct comparison for interval change in the size of the hepatic lesions since the prior CT is difficult as the lesions were less conspicuous on the prior study. A 3.4 x 3.4 cm lesion  seen in the right lobe of the liver. A 5.7 x 8.6 cm area of hypo enhancement is noted in the left lobe of the liver, likely confluence of adjacent metastatic lesions. A percutaneous transhepatic cholecystostomy is noted with tip in the gallbladder. The gallbladder is decompressed. The pancreas is atrophic. The spleen and the right adrenal gland appear unremarkable. There is a 6.7 x 5.7 (previously 6.0 x 5.3 cm) heterogeneous centrally necrotic left adrenal mass. The kidneys and urinary bladder appear unremarkable. Hysterectomy.  Moderate stool noted throughout the colon. There is no evidence of bowel obstruction or inflammation. The visualized abdominal aorta and IVC appear patent. No portal venous gas identified. There is retroperitoneal and mesenteric adenopathy. A 5.5 x 3.1 cm (previously 4.4 by 2.8 cm) partially calcified ovoid soft tissue mesentery mass noted in the lower abdomen. A small nodular density adjacent to the spleen appears similar as the prior study and likely represents a  splenic. Small nodular densities at the right cardiophrenic angle represent small lymph nodes. Metastatic disease is less likely but not excluded.  Midline vertical anterior pelvic wall incisional scar. Degenerative changes of the spine with multilevel osteophyte formation. No acute fracture.  IMPRESSION: Interval progression of the hepatic and nodal metastatic disease. There is interval increase in the size of the left adrenal mass and mesenteric mass/lymph node.  Percutaneous transhepatic cholecystostomy. No calcified gallstone or biliary ductal dilatation.  No evidence of bowel obstruction or inflammation.  Normal appendix.   Electronically Signed   By: Anner Crete M.D.   On: 07/21/2014 01:09   US Abdomen Limited  07/20/2014   CLINICAL DATA:  Right upper quadrant pain after surgery on Monday. Gallbladder drain.  EXAM: US ABDOMEN LIMITED - RIGHT UPPER QUADRANT  COMPARISON:  CT abdomen and pelvis 06/01/2014  FINDINGS: Examination is technically limited due to patient's body habitus and bowel gas.  Gallbladder:  Gallbladder is not visualized. It is likely decompressed with gallbladder drain in place.  Common bile duct:  Diameter: 2.5 mm, normal  Liver:  Diffusely heterogeneous hepatic parenchymal echotexture with nodular contour. Specific lesions are poorly defined, but the appearance is consistent with known hepatic metastases.  IMPRESSION: Gallbladder is not visualized and is likely decompressed. No bile duct dilatation. Heterogeneous parenchymal appearance of the liver consistent with known metastasis.   Electronically Signed   By: Lucienne Capers M.D.   On: 07/20/2014 23:14    EKG: Independently reviewed. Normal sinus rhythm. With nonspecific T-wave changes.  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 SIRS likely associated intra-abdominal - patient has been placed on Cipro and Flagyl as patient is allergic to penicillin. Patient has been kept nothing by mouth except medications. Surgery has been consulted and may need GI consult in a.m. Follow blood cultures and urine cultures. Follow LFTs. 2. Acute renal failure with hyperkalemia - hold spironolactone and Lasix and antihypertensives for now as patient's blood pressure is in the low normal. Gently hydrate and closely follow metabolic panel. 3. Chronic diastolic CHF - holding diuretics due to #1 and 2. Patient is getting gentle hydration. Closely follow metabolic panel and intake and output. 4. Diabetes mellitus type 2 with hyperglycemia - Since patient is nothing by mouth patient's Lantus dose will be decreased from 10 units to 5 units twice a  day. Closely follow CBGs.  5.  Magen carcinoid tumor with metastasis  - per oncology.    DVT ProphylaxisSCDs in anticipation of procedure. Code Status: Full code. Family Communication:  patient's daughter.  Disposition Plan: Admit to inpatient.    Amirr Achord N. [facility] Pager [phone].  If 7PM-7AM, please contact night-coverage [email or web address] Password [email or web address] [facility] 07/21/2014, 5:35 AM

## 2014-07-21 NOTE — Progress Notes (Signed)
ANTIBIOTIC CONSULT NOTE - INITIAL  Pharmacy Consult for Cipro/Flagyl Indication: Intra-abdominal infection  Allergies  Allergen Reactions  . Penicillins Itching and Swelling    Patient Measurements:   Wt=147 kg  Vital Signs: Temp: 99.5 F (37.5 C) (07/14 0549) Temp Source: Axillary (07/14 0549) BP: 105/67 mmHg (07/14 0549) Pulse Rate: 92 (07/14 0549) Intake/Output from previous day:   Intake/Output from this shift:    Labs:  Recent Labs  07/20/14 2103 07/21/14 0459  WBC 22.9* 20.3*  HGB 12.9 11.8*  PLT 271 260  CREATININE 1.34* 1.16*   CrCl cannot be calculated (Unknown ideal weight.). No results for input(s): VANCOTROUGH, VANCOPEAK, VANCORANDOM, GENTTROUGH, GENTPEAK, GENTRANDOM, TOBRATROUGH, TOBRAPEAK, TOBRARND, AMIKACINPEAK, AMIKACINTROU, AMIKACIN in the last 72 hours.   Microbiology: No results found for this or any previous visit (from the past 720 hour(s)).  Medical History: Past Medical History  Diagnosis Date  . Pneumonia   . Renal disorder     renal failure, was on HD for a period of time  . Obesity   . Hypertension   . Diabetes mellitus   . High cholesterol   . CHF (congestive heart failure)   . 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 albuterol (PROVENTIL HFA;VENTOLIN HFA) 108 (90 BASE) MCG/ACT inhaler Inhale 2 puffs into the lungs every 6 (six) hours as needed for wheezing (wheezing).    Past Week at Unknown time  . amLODipine-benazepril (LOTREL) 5-10 MG per capsule Take 1 capsule by mouth daily.   07/19/2014 at Unknown time  . beclomethasone (QVAR) 80 MCG/ACT inhaler Inhale 1 puff into the lungs as needed (sob). breathing   Past Week at Unknown time  . feeding supplement, ENSURE COMPLETE, (ENSURE COMPLETE) LIQD Take 237 mLs by mouth 2 (two) times daily between meals. (Patient taking differently: Take 237 mLs by mouth daily as needed (nutrition). ) 30  Bottle 0 07/19/2014 at Unknown time  . ferrous gluconate (FERGON) 324 MG tablet Take 324 mg by mouth 2 (two) times daily with a meal.   07/19/2014 at Unknown time  . furosemide (LASIX) 40 MG tablet Take 1 tablet (40 mg total) by mouth daily. 10 tablet 0 07/19/2014 at Unknown time  . gabapentin (NEURONTIN) 400 MG capsule Take 1 capsule (400 mg total) by mouth 3 (three) times daily. 90 capsule 0 07/19/2014 at Unknown time  . Ginger, Zingiber officinalis, (GINGER ROOT) 550 MG CAPS Take 1 capsule by mouth daily as needed (nutrition).    unknown at Unknown time  . insulin glargine (LANTUS) 100 UNIT/ML injection Inject 0.1 mLs (10 Units total) into the skin 2 (two) times daily. In the morning and at bedtime. 10 mL 11 07/19/2014 at Unknown time  . ondansetron (ZOFRAN ODT) 4 MG disintegrating tablet Take 1 tablet (4 mg total) by mouth every 8 (eight) hours as needed for nausea or vomiting. 20 tablet 0 07/19/2014 at Unknown time  . oxyCODONE-acetaminophen (PERCOCET) 10-325 MG per tablet Take 1 tablet by mouth every 8 (eight) hours as needed for pain. 30 tablet 0 07/19/2014 at Unknown time  . saccharomyces boulardii (FLORASTOR) 250 MG capsule Take 1 capsule (250 mg total) by mouth 2 (two) times daily. 60 capsule 2 07/19/2014 at Unknown time  . spironolactone (ALDACTONE) 25 MG tablet Take 25 mg by mouth daily.   07/19/2014 at Unknown time  . vitamin C (ASCORBIC ACID) 500 MG tablet Take  1 tablet (500 mg total) by mouth daily. 30 tablet 5 Past Week at Unknown time  . levofloxacin (LEVAQUIN) 750 MG tablet Take 1 tablet (750 mg total) by mouth daily. (Patient not taking: Reported on 07/20/2014) 3 tablet 0 Completed Course at ongoing    Scheduled:  . ciprofloxacin  400 mg Intravenous Q12H  . gabapentin  400 mg Oral TID  . insulin aspart  0-9 Units Subcutaneous Q4H  . insulin glargine  10 Units Subcutaneous BID  . metronidazole  500 mg Intravenous Q8H  . saccharomyces boulardii  250 mg Oral BID  . sodium chloride  3 mL  Intravenous Q12H   Infusions:   Assessment: 73 yoF s/p placement of cholecystostomy tube in early June 2016 now with worsening RUQ abdominal pain and low grade fever.  Cipro/Flagyl per Rx for intra-abdominal infection.   Goal of Therapy:  Treat infection  Plan:   Cipro 400mg  IV q12h  Flagyl 500mg  IV q8h  F/u Scr/cultures  Dorrene German 07/21/2014,6:38 AM

## 2014-07-21 NOTE — Progress Notes (Signed)
Yesenia Lyons   DOB:11-01-1950   PJ#:[id number]   PYK#:[id number]  Subjective: Pt was admitted last night for abdominal pain and possible sepsis. She had cholangiogram by IR on Monday, July 11. Her abdominal pain got worse afterwards. She also had low-grade fever yesterday. She has been not doing well at home lately, with little appetite and energy level. I saw patient and spoke with her daughter who was in the room with her.  Objective:  Filed Vitals:   07/21/14 1436  BP: 123/60  Pulse: 96  Temp: 99.1 F (37.3 C)  Resp: 18    There is no weight on file to calculate BMI.  Intake/Output Summary (Last 24 hours) at 07/21/14 1816 Last data filed at 07/21/14 1437  Gross per 24 hour  Intake    100 ml  Output      0 ml  Net    100 ml     Sclerae slightly jaundice  Oropharynx clear  No peripheral adenopathy  Lungs clear -- no rales or rhonchi  Heart regular rate and rhythm  Abdomen soft, (+) mild diffuse tenderness   MSK no focal spinal tenderness, no peripheral edema  Neuro nonfocal    CBG (last 3)   Recent Labs  07/21/14 0750 07/21/14 1230 07/21/14 1652  GLUCAP 203* 176* 160*     Labs:  Lab Results  Component Value Date   WBC 20.3* 07/21/2014   HGB 11.8* 07/21/2014   HCT 37.4 07/21/2014   MCV 90.1 07/21/2014   PLT 260 07/21/2014   NEUTROABS 15.4* 07/21/2014    @LASTCHEMISTRY @  Urine Studies No results for input(s): UHGB, CRYS in the last 72 hours.  Invalid input(s): UACOL, UAPR, USPG, UPH, UTP, UGL, UKET, UBIL, UNIT, UROB, ULEU, UEPI, UWBC, URBC, UBAC, CAST, Scalp Level, [place]  Basic Metabolic Panel:  Recent Labs Lab 07/20/14 2103 07/21/14 0459  NA 132* 134*  K 5.7* 3.6  CL 96* 99*  CO2 25 26  GLUCOSE 242* 242*  BUN 15 16  CREATININE 1.34* 1.16*  CALCIUM 8.6* 8.0*   GFR CrCl cannot be calculated (Unknown ideal weight.). Liver Function Tests:  Recent Labs Lab 07/20/14 2103 07/21/14 0459  AST 110* 54*  ALT 25 21  ALKPHOS 398* 330*  BILITOT 2.7* 2.1*   PROT 8.6* 7.6  ALBUMIN 3.2* 2.7*    Recent Labs Lab 07/20/14 2103  LIPASE <10*   No results for input(s): AMMONIA in the last 168 hours. Coagulation profile No results for input(s): INR, PROTIME in the last 168 hours.  CBC:  Recent Labs Lab 07/20/14 2103 07/21/14 0459  WBC 22.9* 20.3*  NEUTROABS  --  15.4*  HGB 12.9 11.8*  HCT 40.6 37.4  MCV 90.8 90.1  PLT 271 260   Cardiac Enzymes: No results for input(s): CKTOTAL, CKMB, CKMBINDEX, TROPONINI in the last 168 hours. BNP: Invalid input(s): POCBNP CBG:  Recent Labs Lab 07/21/14 0656 07/21/14 0750 07/21/14 1230 07/21/14 1652  GLUCAP 199* 203* 176* 160*   D-Dimer No results for input(s): DDIMER in the last 72 hours. Hgb A1c No results for input(s): HGBA1C in the last 72 hours. Lipid Profile No results for input(s): CHOL, HDL, LDLCALC, TRIG, CHOLHDL, LDLDIRECT in the last 72 hours. Thyroid function studies No results for input(s): TSH, T4TOTAL, T3FREE, THYROIDAB in the last 72 hours.  Invalid input(s): FREET3 Anemia work up No results for input(s): VITAMINB12, FOLATE, FERRITIN, TIBC, IRON, RETICCTPCT in the last 72 hours. Microbiology Recent Results (from the past 240 hour(s))  Body fluid culture  Status: None (Preliminary result)   Collection Time: 07/21/14 10:42 AM  Result Value Ref Range Status   Specimen Description COMMON BILE DUCT  Final   Special Requests Immunocompromised  Final   Gram Stain   Final    NO WBC SEEN MODERATE GRAM POSITIVE COCCI IN CLUSTERS Performed at [facility]    Culture PENDING  Incomplete   Report Status PENDING  Incomplete      Studies:  Ct Abdomen Pelvis W Contrast  07/21/2014   CLINICAL DATA:  64 year old female with upper abdominal pain and cholecystostomy tube.  EXAM: CT ABDOMEN AND PELVIS WITH CONTRAST  TECHNIQUE: Multidetector CT imaging of the abdomen and pelvis was performed using the standard protocol following bolus administration of intravenous  contrast.  CONTRAST:  169mL OMNIPAQUE IOHEXOL 300 MG/ML  SOLN  COMPARISON:  CT dated 12/27/2013 and ultrasound dated 07/20/2014 appearance  FINDINGS: Bibasilar linear atelectasis/ scarring. Top-normal cardiac size. No intra-abdominal free air. Small perihepatic ascites.  There are innumerable hepatic hypodense lesions compatible with metastatic disease. Direct comparison for interval change in the size of the hepatic lesions since the prior CT is difficult as the lesions were less conspicuous on the prior study. A 3.4 x 3.4 cm lesion seen in the right lobe of the liver. A 5.7 x 8.6 cm area of hypo enhancement is noted in the left lobe of the liver, likely confluence of adjacent metastatic lesions. A percutaneous transhepatic cholecystostomy is noted with tip in the gallbladder. The gallbladder is decompressed. The pancreas is atrophic. The spleen and the right adrenal gland appear unremarkable. There is a 6.7 x 5.7 (previously 6.0 x 5.3 cm) heterogeneous centrally necrotic left adrenal mass. The kidneys and urinary bladder appear unremarkable. Hysterectomy.  Moderate stool noted throughout the colon. There is no evidence of bowel obstruction or inflammation. The visualized abdominal aorta and IVC appear patent. No portal venous gas identified. There is retroperitoneal and mesenteric adenopathy. A 5.5 x 3.1 cm (previously 4.4 by 2.8 cm) partially calcified ovoid soft tissue mesentery mass noted in the lower abdomen. A small nodular density adjacent to the spleen appears similar as the prior study and likely represents a splenic. Small nodular densities at the right cardiophrenic angle represent small lymph nodes. Metastatic disease is less likely but not excluded.  Midline vertical anterior pelvic wall incisional scar. Degenerative changes of the spine with multilevel osteophyte formation. No acute fracture.  IMPRESSION: Interval progression of the hepatic and nodal metastatic disease. There is interval increase in  the size of the left adrenal mass and mesenteric mass/lymph node.  Percutaneous transhepatic cholecystostomy. No calcified gallstone or biliary ductal dilatation.  No evidence of bowel obstruction or inflammation.  Normal appendix.   Electronically Signed   By: Anner Crete M.D.   On: 07/21/2014 01:09   US Abdomen Limited  07/20/2014   CLINICAL DATA:  Right upper quadrant pain after surgery on Monday. Gallbladder drain.  EXAM: US ABDOMEN LIMITED - RIGHT UPPER QUADRANT  COMPARISON:  CT abdomen and pelvis 06/01/2014  FINDINGS: Examination is technically limited due to patient's body habitus and bowel gas.  Gallbladder:  Gallbladder is not visualized. It is likely decompressed with gallbladder drain in place.  Common bile duct:  Diameter: 2.5 mm, normal  Liver:  Diffusely heterogeneous hepatic parenchymal echotexture with nodular contour. Specific lesions are poorly defined, but the appearance is consistent with known hepatic metastases.  IMPRESSION: Gallbladder is not visualized and is likely decompressed. No bile duct dilatation. Heterogeneous parenchymal appearance of  the liver consistent with known metastasis.   Electronically Signed   By: Lucienne Capers M.D.   On: 07/20/2014 23:14    Assessment: 64 y.o.  1. Sepsis, probably from abdominal source 2. 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 Acute encephalopathy, improved 4. Hypertension  5. Diabetes mellitus 6. AKI, resolving 7. Leukocytosis secondary to sepsis 8. Fever secondary to sepsis  9. Chronic diastolic CHF   Plan: -I discussed her CT scan findings with patient and her daughter. Apparently she has disease progression on the scan, her chromogranin A level has also been significantly trending up, this is all consistent with disease progression. -I discussed switching her Sandostatin to next line treatment, likely everolimus, in a few weeks after the episodes of sepsis resolves. But I am concerned  about her tolerance to the treatment giving her poor performance status and abnormal liver function.  -I would also refer her to interventional radiology to consider liver targeted therapy, after she recovers from the acute infection and if her liver function improves. -She was evaluated by surgery today, awaiting for the final surgical recommendation by Dr. Hassell Done. Per patient's daughter, Dr. Johney Maine offered her cholecystectomy in early August. I'm wondering if this needs to be done earlier, then we can proceed with other anticancer treatment. -continue antibiotics, pending cultures -I'll follow her when she is in-house. I'll see you back in a few weeks after her discharge.  Truitt Merle, MD 07/21/2014  6:16 PM

## 2014-07-21 NOTE — Progress Notes (Signed)
Patient ID: Yesenia Lyons, female   DOB: 1950-08-19, 64 y.o.   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  She is known to [place] from a recent hospitalization for sepsis at which time she had a percutaneous cholecystostomy tube placed and has followed up with Dr. Johney Maine.  She reports sudden onset RUQ abdominal pain after drain study on Monday.  Associated with nausea.  She has been febrile, LFTs are up, but now improving.  WBC trending down from 22.9k to 20.3k.  CT of A/P showed progression of hepatic and nodal metastatic disease.  Objective:  Vital signs:  Filed Vitals:   07/20/14 2233 07/21/14 0048 07/21/14 0417 07/21/14 0549  BP: 122/64 117/73 102/62 105/67  Pulse: 70 89 94 92  Temp:  98.7 F (37.1 C)  99.5 F (37.5 C)  TempSrc:  Oral  Axillary  Resp: '18 20 18 20  ' SpO2: 99% 100% 95% 97%       Intake/Output   Yesterday:    This shift:     Physical Exam: General: Pt awake/alert/oriented x4 in no acute distress Chest: cta. No chest wall pain w good excursion CV:  Pulses intact.  Regular rhythm MS: Normal AROM mjr joints.  No obvious deformity Abdomen: Soft.  Nondistended.  Mild TTP upper abdomen.  Drain with bilious output.    No evidence of peritonitis.  No incarcerated hernias. Ext:  SCDs BLE.  No mjr edema.  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   ARF (acute renal failure)    Results:   Labs: Results for orders placed or performed during the hospital  encounter of 07/20/14 (from the past 48 hour(s))  Urinalysis, Routine w reflex microscopic (not at [facility])     Status: Abnormal   Collection Time: 07/20/14  8:33 PM  Result Value Ref Range   Color, Urine ORANGE (A) YELLOW    Comment: BIOCHEMICALS MAY BE AFFECTED BY COLOR   APPearance CLOUDY (A) CLEAR   Specific Gravity, Urine 1.029 1.005 - 1.030   pH 5.5 5.0 - 8.0   Glucose, UA NEGATIVE NEGATIVE mg/dL   Hgb urine dipstick NEGATIVE NEGATIVE   Bilirubin Urine MODERATE (A) NEGATIVE   Ketones, ur NEGATIVE NEGATIVE mg/dL   Protein, ur 30 (A) NEGATIVE mg/dL   Urobilinogen, UA 1.0 0.0 - 1.0 mg/dL   Nitrite NEGATIVE NEGATIVE   Leukocytes, UA SMALL (A) NEGATIVE  Urine microscopic-add on     Status: Abnormal   Collection Time: 07/20/14  8:33 PM  Result Value Ref Range   Squamous Epithelial / LPF MANY (A) RARE   WBC, UA 3-6 <3 WBC/hpf   Bacteria, UA FEW (A) RARE   Casts HYALINE CASTS (A) NEGATIVE  Lipase, blood     Status: Abnormal   Collection Time: 07/20/14  9:03 PM  Result Value Ref Range   Lipase <10 (L) 22 - 51 U/L  Comprehensive metabolic panel     Status: Abnormal  Collection Time: 07/20/14  9:03 PM  Result Value Ref Range   Sodium 132 (L) 135 - 145 mmol/L   Potassium 5.7 (H) 3.5 - 5.1 mmol/L    Comment: SLIGHT HEMOLYSIS HEMOLYSIS AT THIS LEVEL MAY AFFECT RESULT    Chloride 96 (L) 101 - 111 mmol/L   CO2 25 22 - 32 mmol/L   Glucose, Bld 242 (H) 65 - 99 mg/dL   BUN 15 6 - 20 mg/dL   Creatinine, Ser 1.34 (H) 0.44 - 1.00 mg/dL   Calcium 8.6 (L) 8.9 - 10.3 mg/dL   Total Protein 8.6 (H) 6.5 - 8.1 g/dL   Albumin 3.2 (L) 3.5 - 5.0 g/dL   AST 110 (H) 15 - 41 U/L    Comment: SLIGHT HEMOLYSIS HEMOLYSIS AT THIS LEVEL MAY AFFECT RESULT    ALT 25 14 - 54 U/L    Comment: SLIGHT HEMOLYSIS HEMOLYSIS AT THIS LEVEL MAY AFFECT RESULT    Alkaline Phosphatase 398 (H) 38 - 126 U/L    Comment: SLIGHT HEMOLYSIS HEMOLYSIS AT THIS LEVEL MAY AFFECT RESULT    Total Bilirubin 2.7 (H) 0.3 - 1.2  mg/dL    Comment: SLIGHT HEMOLYSIS HEMOLYSIS AT THIS LEVEL MAY AFFECT RESULT    GFR calc non Af Amer 41 (L) >60 mL/min   GFR calc Af Amer 47 (L) >60 mL/min    Comment: (NOTE) The eGFR has been calculated using the CKD EPI equation. This calculation has not been validated in all clinical situations. eGFR's persistently <60 mL/min signify possible Chronic Kidney Disease.    Anion gap 11 5 - 15  CBC     Status: Abnormal   Collection Time: 07/20/14  9:03 PM  Result Value Ref Range   WBC 22.9 (H) 4.0 - 10.5 K/uL   RBC 4.47 3.87 - 5.11 MIL/uL   Hemoglobin 12.9 12.0 - 15.0 g/dL   HCT 40.6 36.0 - 46.0 %   MCV 90.8 78.0 - 100.0 fL   MCH 28.9 26.0 - 34.0 pg   MCHC 31.8 30.0 - 36.0 g/dL   RDW 14.0 11.5 - 15.5 %   Platelets 271 150 - 400 K/uL  Hepatic function panel     Status: Abnormal   Collection Time: 07/21/14  4:59 AM  Result Value Ref Range   Total Protein 7.6 6.5 - 8.1 g/dL   Albumin 2.7 (L) 3.5 - 5.0 g/dL   AST 54 (H) 15 - 41 U/L   ALT 21 14 - 54 U/L   Alkaline Phosphatase 330 (H) 38 - 126 U/L   Total Bilirubin 2.1 (H) 0.3 - 1.2 mg/dL   Bilirubin, Direct 0.8 (H) 0.1 - 0.5 mg/dL   Indirect Bilirubin 1.3 (H) 0.3 - 0.9 mg/dL  Basic metabolic panel     Status: Abnormal   Collection Time: 07/21/14  4:59 AM  Result Value Ref Range   Sodium 134 (L) 135 - 145 mmol/L   Potassium 3.6 3.5 - 5.1 mmol/L    Comment: DELTA CHECK NOTED REPEATED TO VERIFY    Chloride 99 (L) 101 - 111 mmol/L   CO2 26 22 - 32 mmol/L   Glucose, Bld 242 (H) 65 - 99 mg/dL   BUN 16 6 - 20 mg/dL   Creatinine, Ser 1.16 (H) 0.44 - 1.00 mg/dL   Calcium 8.0 (L) 8.9 - 10.3 mg/dL   GFR calc non Af Amer 48 (L) >60 mL/min   GFR calc Af Amer 56 (L) >60 mL/min    Comment: (NOTE) The eGFR has  been calculated using the CKD EPI equation. This calculation has not been validated in all clinical situations. eGFR's persistently <60 mL/min signify possible Chronic Kidney Disease.    Anion gap 9 5 - 15  CBC WITH  DIFFERENTIAL     Status: Abnormal   Collection Time: 07/21/14  4:59 AM  Result Value Ref Range   WBC 20.3 (H) 4.0 - 10.5 K/uL   RBC 4.15 3.87 - 5.11 MIL/uL   Hemoglobin 11.8 (L) 12.0 - 15.0 g/dL   HCT 37.4 36.0 - 46.0 %   MCV 90.1 78.0 - 100.0 fL   MCH 28.4 26.0 - 34.0 pg   MCHC 31.6 30.0 - 36.0 g/dL   RDW 13.9 11.5 - 15.5 %   Platelets 260 150 - 400 K/uL   Neutrophils Relative % 76 43 - 77 %   Neutro Abs 15.4 (H) 1.7 - 7.7 K/uL   Lymphocytes Relative 6 (L) 12 - 46 %   Lymphs Abs 1.3 0.7 - 4.0 K/uL   Monocytes Relative 18 (H) 3 - 12 %   Monocytes Absolute 3.6 (H) 0.1 - 1.0 K/uL   Eosinophils Relative 0 0 - 5 %   Eosinophils Absolute 0.0 0.0 - 0.7 K/uL   Basophils Relative 0 0 - 1 %   Basophils Absolute 0.0 0.0 - 0.1 K/uL  Procalcitonin - Baseline     Status: None   Collection Time: 07/21/14  4:59 AM  Result Value Ref Range   Procalcitonin 3.45 ng/mL    Comment:        Interpretation: PCT > 2 ng/mL: Systemic infection (sepsis) is likely, unless other causes are known. (NOTE)         ICU PCT Algorithm               Non ICU PCT Algorithm    ----------------------------     ------------------------------         PCT < 0.25 ng/mL                 PCT < 0.1 ng/mL     Stopping of antibiotics            Stopping of antibiotics       strongly encouraged.               strongly encouraged.    ----------------------------     ------------------------------       PCT level decrease by               PCT < 0.25 ng/mL       >= 80% from peak PCT       OR PCT 0.25 - 0.5 ng/mL          Stopping of antibiotics                                             encouraged.     Stopping of antibiotics           encouraged.    ----------------------------     ------------------------------       PCT level decrease by              PCT >= 0.25 ng/mL       < 80% from peak PCT        AND PCT >= 0.5 ng/mL  Continuing antibiotics                                               encouraged.        Continuing antibiotics            encouraged.    ----------------------------     ------------------------------     PCT level increase compared          PCT > 0.5 ng/mL         with peak PCT AND          PCT >= 0.5 ng/mL             Escalation of antibiotics                                          strongly encouraged.      Escalation of antibiotics        strongly encouraged.   Glucose, capillary     Status: Abnormal   Collection Time: 07/21/14  6:56 AM  Result Value Ref Range   Glucose-Capillary 199 (H) 65 - 99 mg/dL  Lactic acid, plasma     Status: None   Collection Time: 07/21/14  7:00 AM  Result Value Ref Range   Lactic Acid, Venous 1.7 0.5 - 2.0 mmol/L  Glucose, capillary     Status: Abnormal   Collection Time: 07/21/14  7:50 AM  Result Value Ref Range   Glucose-Capillary 203 (H) 65 - 99 mg/dL  Lactic acid, plasma     Status: Abnormal   Collection Time: 07/21/14  9:42 AM  Result Value Ref Range   Lactic Acid, Venous 3.1 (HH) 0.5 - 2.0 mmol/L    Comment: REPEATED TO VERIFY CRITICAL RESULT CALLED TO, READ BACK BY AND VERIFIED WITH: GIBBS,B. '@1045'  ON 7.14.16 BY MCCOY, N.     Imaging / Studies: Ct Abdomen Pelvis W Contrast  07/21/2014   CLINICAL DATA:  64 year old female with upper abdominal pain and cholecystostomy tube.  EXAM: CT ABDOMEN AND PELVIS WITH CONTRAST  TECHNIQUE: Multidetector CT imaging of the abdomen and pelvis was performed using the standard protocol following bolus administration of intravenous contrast.  CONTRAST:  163m OMNIPAQUE IOHEXOL 300 MG/ML  SOLN  COMPARISON:  CT dated 12/27/2013 and ultrasound dated 07/20/2014 appearance  FINDINGS: Bibasilar linear atelectasis/ scarring. Top-normal cardiac size. No intra-abdominal free air. Small perihepatic ascites.  There are innumerable hepatic hypodense lesions compatible with metastatic disease. Direct comparison for interval change in the size of the hepatic lesions since the prior CT is difficult as the lesions  were less conspicuous on the prior study. A 3.4 x 3.4 cm lesion seen in the right lobe of the liver. A 5.7 x 8.6 cm area of hypo enhancement is noted in the left lobe of the liver, likely confluence of adjacent metastatic lesions. A percutaneous transhepatic cholecystostomy is noted with tip in the gallbladder. The gallbladder is decompressed. The pancreas is atrophic. The spleen and the right adrenal gland appear unremarkable. There is a 6.7 x 5.7 (previously 6.0 x 5.3 cm) heterogeneous centrally necrotic left adrenal mass. The kidneys and urinary bladder appear unremarkable. Hysterectomy.  Moderate stool noted throughout the colon. There is no evidence of bowel obstruction or inflammation. The visualized abdominal aorta  and IVC appear patent. No portal venous gas identified. There is retroperitoneal and mesenteric adenopathy. A 5.5 x 3.1 cm (previously 4.4 by 2.8 cm) partially calcified ovoid soft tissue mesentery mass noted in the lower abdomen. A small nodular density adjacent to the spleen appears similar as the prior study and likely represents a splenic. Small nodular densities at the right cardiophrenic angle represent small lymph nodes. Metastatic disease is less likely but not excluded.  Midline vertical anterior pelvic wall incisional scar. Degenerative changes of the spine with multilevel osteophyte formation. No acute fracture.  IMPRESSION: Interval progression of the hepatic and nodal metastatic disease. There is interval increase in the size of the left adrenal mass and mesenteric mass/lymph node.  Percutaneous transhepatic cholecystostomy. No calcified gallstone or biliary ductal dilatation.  No evidence of bowel obstruction or inflammation.  Normal appendix.   Electronically Signed   By: Anner Crete M.D.   On: 07/21/2014 01:09   US Abdomen Limited  07/20/2014   CLINICAL DATA:  Right upper quadrant pain after surgery on Monday. Gallbladder drain.  EXAM: US ABDOMEN LIMITED - RIGHT UPPER  QUADRANT  COMPARISON:  CT abdomen and pelvis 06/01/2014  FINDINGS: Examination is technically limited due to patient's body habitus and bowel gas.  Gallbladder:  Gallbladder is not visualized. It is likely decompressed with gallbladder drain in place.  Common bile duct:  Diameter: 2.5 mm, normal  Liver:  Diffusely heterogeneous hepatic parenchymal echotexture with nodular contour. Specific lesions are poorly defined, but the appearance is consistent with known hepatic metastases.  IMPRESSION: Gallbladder is not visualized and is likely decompressed. No bile duct dilatation. Heterogeneous parenchymal appearance of the liver consistent with known metastasis.   Electronically Signed   By: Lucienne Capers M.D.   On: 07/20/2014 23:14    Medications / Allergies:  Scheduled Meds: . gabapentin  400 mg Oral TID  . imipenem-cilastatin  500 mg Intravenous 3 times per day  . insulin aspart  0-9 Units Subcutaneous Q4H  . insulin glargine  10 Units Subcutaneous BID  . saccharomyces boulardii  250 mg Oral BID  . sodium chloride  2,000 mL Intravenous Once  . sodium chloride  3 mL Intravenous Q12H   Continuous Infusions:  PRN Meds:.acetaminophen **OR** acetaminophen, albuterol, budesonide (PULMICORT) nebulizer solution, ondansetron **OR** ondansetron (ZOFRAN) IV  Antibiotics: Anti-infectives    Start     Dose/Rate Route Frequency Ordered Stop   07/21/14 1400  imipenem-cilastatin (PRIMAXIN) 500 mg in sodium chloride 0.9 % 100 mL IVPB     500 mg 200 mL/hr over 30 Minutes Intravenous 3 times per day 07/21/14 0824     07/21/14 1000  ciprofloxacin (CIPRO) IVPB 400 mg  Status:  Discontinued     400 mg 200 mL/hr over 60 Minutes Intravenous Every 12 hours 07/21/14 0622 07/21/14 0803   07/21/14 0830  imipenem-cilastatin (PRIMAXIN) 500 mg in sodium chloride 0.9 % 100 mL IVPB  Status:  Discontinued     500 mg 200 mL/hr over 30 Minutes Intravenous 3 times per day 07/21/14 0822 07/21/14 0824   07/21/14 0800   metroNIDAZOLE (FLAGYL) IVPB 500 mg  Status:  Discontinued     500 mg 100 mL/hr over 60 Minutes Intravenous Every 8 hours 07/21/14 0622 07/21/14 1018   07/20/14 2200  ciprofloxacin (CIPRO) IVPB 400 mg     400 mg 200 mL/hr over 60 Minutes Intravenous  Once 07/20/14 2154 07/21/14 0059   07/20/14 2200  metroNIDAZOLE (FLAGYL) IVPB 500 mg     500  mg 100 mL/hr over 60 Minutes Intravenous  Once 07/20/14 2154 07/21/14 0414        Assessment/Plan Sepsis Cholecystitis s/p perc chole drain-it was studied on 7/11 cystic and common bile duct were patent, but had small filling defects/sludge/stones and tube was kept in place.  Not much more to do from surgical standpoint.   Not sure why she's septic, ascending cholangitis? Abdominal pain could be from disease progression.  Agree with primaxin and supportive care. Advance diet as tolerated. OP follow up with Dr. Johney Maine.  Will follow along.   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 Francie Massing, [facility] Surgery Pager [phone](7A-4:30P)   07/21/2014 11:13 AM

## 2014-07-21 NOTE — Progress Notes (Addendum)
ANTIBIOTIC CONSULT NOTE - FOLLOW UP  Pharmacy Consult for Primaxin Indication: intra-abdominal infection  Allergies  Allergen Reactions  . Penicillins Itching and Swelling    Patient Measurements:   Adjusted Body Weight: 100 kg  Vital Signs: Temp: 99.5 F (37.5 C) (07/14 0549) Temp Source: Axillary (07/14 0549) BP: 105/67 mmHg (07/14 0549) Pulse Rate: 92 (07/14 0549) Intake/Output from previous day:   Intake/Output from this shift:    Labs:  Recent Labs  07/20/14 2103 07/21/14 0459  WBC 22.9* 20.3*  HGB 12.9 11.8*  PLT 271 260  CREATININE 1.34* 1.16*   CrCl cannot be calculated (Unknown ideal weight.). No results for input(s): VANCOTROUGH, VANCOPEAK, VANCORANDOM, GENTTROUGH, GENTPEAK, GENTRANDOM, TOBRATROUGH, TOBRAPEAK, TOBRARND, AMIKACINPEAK, AMIKACINTROU, AMIKACIN in the last 72 hours.   Microbiology: No results found for this or any previous visit (from the past 720 hour(s)).  Anti-infectives    Start     Dose/Rate Route Frequency Ordered Stop   07/21/14 1400  imipenem-cilastatin (PRIMAXIN) 500 mg in sodium chloride 0.9 % 100 mL IVPB     500 mg 200 mL/hr over 30 Minutes Intravenous 3 times per day 07/21/14 0824     07/21/14 1000  ciprofloxacin (CIPRO) IVPB 400 mg  Status:  Discontinued     400 mg 200 mL/hr over 60 Minutes Intravenous Every 12 hours 07/21/14 0622 07/21/14 0803   07/21/14 0830  imipenem-cilastatin (PRIMAXIN) 500 mg in sodium chloride 0.9 % 100 mL IVPB  Status:  Discontinued     500 mg 200 mL/hr over 30 Minutes Intravenous 3 times per day 07/21/14 0822 07/21/14 0824   07/21/14 0800  metroNIDAZOLE (FLAGYL) IVPB 500 mg     500 mg 100 mL/hr over 60 Minutes Intravenous Every 8 hours 07/21/14 0622     07/20/14 2200  ciprofloxacin (CIPRO) IVPB 400 mg     400 mg 200 mL/hr over 60 Minutes Intravenous  Once 07/20/14 2154 07/21/14 0059   07/20/14 2200  metroNIDAZOLE (FLAGYL) IVPB 500 mg     500 mg 100 mL/hr over 60 Minutes Intravenous  Once 07/20/14  2154 07/21/14 0414      Assessment: 64 yoF s/p placement of cholecystostomy tube in early June 2016 now with worsening RUQ abdominal pain and low grade fever. Started on Cipro/Flagyl per Rx for intra-abdominal infection, now to switch to Primaxin.  7/14 >> Cipro >> 7/14 7/14 >> Flagyl >> 7/14 7/14 >> Primaxin >>   Temp: 100.6 yesterday WBC: elevated but trending down Renal: SCr wnl but about 2x baseline; CrCl 76 CG, 55 N  7/14 blood: IP  Drug level / dose changes info:   Goal of Therapy:  Eradication of infection Appropriate antibiotic dosing for indication and renal function  Plan:  Day 1 antibiotics  Begin Primaxin 500 mg IV q8 hr  Would d/c Flagyl as redundant anaerobic coverage  Follow clinical course, renal function, culture results as available  Follow for de-escalation of antibiotics and LOT   Reuel Boom, PharmD, BCPS Pager: [phone] 07/21/2014, 8:26 AM

## 2014-07-21 NOTE — Progress Notes (Signed)
CRITICAL VALUE ALERT  Critical value received: lactic acid 2.3 Date of notification:  07/14/201  Time of notification: 2130         Critical value read back:Yes.    Nurse who received alert:  Delano Metz  MD notified (1st page):  Tylene Fantasia  Time of first page:  2150  MD notified (2nd page):  Time of second page:  Responding MD:  Tylene Fantasia  Time MD responded:  2200

## 2014-07-21 NOTE — Progress Notes (Signed)
Utilization review completed.  

## 2014-07-21 NOTE — Progress Notes (Signed)
CRITICAL VALUE ALERT  Critical value received:  Lactic acid 3.1  Date of notification:  07/21/14  Time of notification:  4782  Critical value read back:Yes.    Nurse who received alert:  B.Lavina Hamman RN  MD notified (1st page):  Dhungel  Time of first page:  1047  Responding MD:  Dhungel  Time MD responded:  1050

## 2014-07-21 NOTE — Progress Notes (Addendum)
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 Burr Medico), diabetes mellitus who was hospitalized one month back for sepsis due to acute cholecystitis and had percutaneous cholecystostomy tube placed presented to the ED with sudden onset of right upper quadrant abdominal pain after she had a drain study done on 7/11. This is associated with nausea and was febrile in the ED with mildly elevated LFTs. She also has significant leukocytosis and elevated lactic acid meeting criteria for sepsis. A CT scan of the abdomen and pelvis showing progression of hepatic and metastatic disease.  Assessment/Plan: Sepsis Possibly related to ascending cholangitis versus progressive liver metastases. Placed on Cipro and Flagyl on admission which has been transitioned to Primaxin for broader coverage. Elevated lactic acid. Bolus with 2 L IV normal saline and monitor. Check blood cultures. Sent biliary drain culture as well. Cholangiogram done by IR on 7/11 showed patent drain. No further recommendations per surgery. Continue maintenance IV hydration and supportive care with Tylenol and antiemetics. No further abdominal pain symptoms.   Type 2 diabetes mellitus Monitor on sliding insulin . Resume home dose Lantus twice a day  Metastatic carcinoid tumor Follows with Dr. Burr Medico. ET on admission shows progressive liver and nodal metastases. On octreotide as outpatient. Dr Burr Medico consulted.  Acute kidney injury Secondary to sepsis and dehydration. Hold Lasix and Monitor with IV fluids   Hyperkalemia Hold spironolactone.   Diet: Clear liquid DVT prophylaxis: Subcutaneous heparin  code Status: Full code Family Communication: None at bedside Disposition Plan: Monitor as Inpatient   Consultants:  CCS   Dr Burr Medico  Procedures:  CT  abd and pelvis  Antibiotics:  IV cipro and flagyl x 1 dose   IV primaxin 7/14--  HPI/Subjective: Inpatient seen and examined. Denies abdominal pain. Admission H&P reviewed.  Objective: Filed Vitals:   07/21/14 1436  BP: 123/60  Pulse: 96  Temp: 99.1 F (37.3 C)  Resp: 18    Intake/Output Summary (Last 24 hours) at 07/21/14 1549 Last data filed at 07/21/14 1437  Gross per 24 hour  Intake    100 ml  Output      0 ml  Net    100 ml   There were no vitals filed for this visit.  Exam:   General:  Early obese female lying in bed appears fatigued, not in distress  HEENT: No pallor, moist oral mucosa, supple neck  Chest: Clear to auscultation bilaterally  CVS: Normal S1 and S2, no murmurs rub or gallop  GI: Soft, nontender, nondistended, percutaneous biliary drain in place  Musculoskeletal: Warm, no edema   CNS: Alert and oriented  Data Reviewed: Basic Metabolic Panel:  Recent Labs Lab 07/20/14 2103 07/21/14 0459  NA 132* 134*  K 5.7* 3.6  CL 96* 99*  CO2 25 26  GLUCOSE 242* 242*  BUN 15 16  CREATININE 1.34* 1.16*  CALCIUM 8.6* 8.0*   Liver Function Tests:  Recent Labs Lab 07/20/14 2103 07/21/14 0459  AST 110* 54*  ALT 25 21  ALKPHOS 398* 330*  BILITOT 2.7* 2.1*  PROT 8.6* 7.6  ALBUMIN 3.2* 2.7*    Recent Labs Lab 07/20/14 2103  LIPASE <10*   No results for input(s): AMMONIA in the last 168 hours. CBC:  Recent Labs Lab 07/20/14 2103 07/21/14 0459  WBC 22.9* 20.3*  NEUTROABS  --  15.4*  HGB 12.9 11.8*  HCT 40.6 37.4  MCV 90.8 90.1  PLT 271 260   Cardiac Enzymes: No results for input(s): CKTOTAL, CKMB, CKMBINDEX, TROPONINI in the last 168 hours. BNP (last 3 results) No results for input(s): BNP in the last 8760 hours.  ProBNP (last 3 results) No results for input(s): PROBNP in the last 8760 hours.  CBG:  Recent Labs Lab 07/21/14 0656 07/21/14 0750 07/21/14 1230  GLUCAP 199* 203* 176*    Recent Results (from the  past 240 hour(s))  Body fluid culture     Status: None (Preliminary result)   Collection Time: 07/21/14 10:42 AM  Result Value Ref Range Status   Specimen Description COMMON BILE DUCT  Final   Special Requests Immunocompromised  Final   Gram Stain   Final    NO WBC SEEN MODERATE GRAM POSITIVE COCCI IN CLUSTERS Performed at [facility]    Culture PENDING  Incomplete   Report Status PENDING  Incomplete     Studies: Ct Abdomen Pelvis W Contrast  07/21/2014   CLINICAL DATA:  64 year old female with upper abdominal pain and cholecystostomy tube.  EXAM: CT ABDOMEN AND PELVIS WITH CONTRAST  TECHNIQUE: Multidetector CT imaging of the abdomen and pelvis was performed using the standard protocol following bolus administration of intravenous contrast.  CONTRAST:  170mL OMNIPAQUE IOHEXOL 300 MG/ML  SOLN  COMPARISON:  CT dated 12/27/2013 and ultrasound dated 07/20/2014 appearance  FINDINGS: Bibasilar linear atelectasis/ scarring. Top-normal cardiac size. No intra-abdominal free air. Small perihepatic ascites.  There are innumerable hepatic hypodense lesions compatible with metastatic disease. Direct comparison for interval change in the size of the hepatic lesions since the prior CT is difficult as the lesions were less conspicuous on the prior study. A 3.4 x 3.4 cm lesion seen in the right lobe of the liver. A 5.7 x 8.6 cm area of hypo enhancement is noted in the left lobe of the liver, likely confluence of adjacent metastatic lesions. A percutaneous transhepatic cholecystostomy is noted with tip in the gallbladder. The gallbladder is decompressed. The pancreas is atrophic. The spleen and the right adrenal gland appear unremarkable. There is a 6.7 x 5.7 (previously 6.0 x 5.3 cm) heterogeneous centrally necrotic left adrenal mass. The kidneys and urinary bladder appear unremarkable. Hysterectomy.  Moderate stool noted throughout the colon. There is no evidence of bowel obstruction or inflammation. The  visualized abdominal aorta and IVC appear patent. No portal venous gas identified. There is retroperitoneal and mesenteric adenopathy. A 5.5 x 3.1 cm (previously 4.4 by 2.8 cm) partially calcified ovoid soft tissue mesentery mass noted in the lower abdomen. A small nodular density adjacent to the spleen appears similar as the prior study and likely represents a splenic. Small nodular densities at the right cardiophrenic angle represent small lymph nodes. Metastatic disease is less likely but not excluded.  Midline vertical anterior pelvic wall incisional scar. Degenerative changes of the spine with multilevel osteophyte formation. No acute fracture.  IMPRESSION: Interval progression of the hepatic and nodal metastatic disease. There is interval increase in the size of the left adrenal mass and mesenteric mass/lymph node.  Percutaneous transhepatic cholecystostomy. No calcified gallstone or biliary ductal dilatation.  No evidence of bowel obstruction or inflammation.  Normal appendix.   Electronically Signed   By: Anner Crete M.D.   On: 07/21/2014 01:09   US Abdomen Limited  07/20/2014   CLINICAL DATA:  Right upper quadrant pain after surgery on Monday. Gallbladder drain.  EXAM: US ABDOMEN LIMITED - RIGHT UPPER QUADRANT  COMPARISON:  CT abdomen and pelvis 06/01/2014  FINDINGS: Examination is technically limited due to patient's body habitus and bowel gas.  Gallbladder:  Gallbladder is not visualized. It is likely decompressed with gallbladder drain in place.  Common bile duct:  Diameter: 2.5 mm, normal  Liver:  Diffusely heterogeneous hepatic parenchymal echotexture with nodular contour. Specific lesions are poorly defined, but the appearance is consistent with known hepatic metastases.  IMPRESSION: Gallbladder is not visualized and is likely decompressed. No bile duct dilatation. Heterogeneous parenchymal appearance of the liver consistent with known metastasis.   Electronically Signed   By: Lucienne Capers  M.D.   On: 07/20/2014 23:14    Scheduled Meds: . gabapentin  400 mg Oral TID  . imipenem-cilastatin  500 mg Intravenous 3 times per day  . insulin aspart  0-9 Units Subcutaneous Q4H  . insulin glargine  10 Units Subcutaneous BID  . saccharomyces boulardii  250 mg Oral BID  . sodium chloride  3 mL Intravenous Q12H   Continuous Infusions:    Time spent: 25 minutes    Yesenia Lyons, [place]  [facility] Pager [phone] If 7PM-7AM, please contact night-coverage at [email or web address], password [facility] 07/21/2014, 3:49 PM  LOS: 0 days

## 2014-07-22 ENCOUNTER — Other Ambulatory Visit: Payer: Medicare Other

## 2014-07-22 ENCOUNTER — Ambulatory Visit: Payer: Medicare Other

## 2014-07-22 DIAGNOSIS — K8309 Other cholangitis: Secondary | ICD-10-CM

## 2014-07-22 LAB — CBC
HCT: 35.3 % — ABNORMAL LOW (ref 36.0–46.0)
Hemoglobin: 11.1 g/dL — ABNORMAL LOW (ref 12.0–15.0)
MCH: 28.1 pg (ref 26.0–34.0)
MCHC: 31.4 g/dL (ref 30.0–36.0)
MCV: 89.4 fL (ref 78.0–100.0)
PLATELETS: 226 10*3/uL (ref 150–400)
RBC: 3.95 MIL/uL (ref 3.87–5.11)
RDW: 13.9 % (ref 11.5–15.5)
WBC: 19.2 10*3/uL — ABNORMAL HIGH (ref 4.0–10.5)

## 2014-07-22 LAB — COMPREHENSIVE METABOLIC PANEL
ALK PHOS: 288 U/L — AB (ref 38–126)
ALT: 15 U/L (ref 14–54)
AST: 28 U/L (ref 15–41)
Albumin: 2.4 g/dL — ABNORMAL LOW (ref 3.5–5.0)
Anion gap: 6 (ref 5–15)
BUN: 13 mg/dL (ref 6–20)
CALCIUM: 7.8 mg/dL — AB (ref 8.9–10.3)
CHLORIDE: 108 mmol/L (ref 101–111)
CO2: 27 mmol/L (ref 22–32)
Creatinine, Ser: 1.05 mg/dL — ABNORMAL HIGH (ref 0.44–1.00)
GFR calc Af Amer: >60 mL/min (ref >60)
GFR calc non Af Amer: 55 mL/min — ABNORMAL LOW (ref >60)
Glucose, Bld: 160 mg/dL — ABNORMAL HIGH (ref 65–99)
POTASSIUM: 3.3 mmol/L — AB (ref 3.5–5.1)
SODIUM: 141 mmol/L (ref 135–145)
Total Bilirubin: 1.7 mg/dL — ABNORMAL HIGH (ref 0.3–1.2)
Total Protein: 7.1 g/dL (ref 6.5–8.1)

## 2014-07-22 LAB — AMMONIA: Ammonia: 30 umol/L (ref 9–35)

## 2014-07-22 LAB — GLUCOSE, CAPILLARY
GLUCOSE-CAPILLARY: 138 mg/dL — AB (ref 65–99)
GLUCOSE-CAPILLARY: 151 mg/dL — AB (ref 65–99)
GLUCOSE-CAPILLARY: 157 mg/dL — AB (ref 65–99)
GLUCOSE-CAPILLARY: 121 mg/dL — AB (ref 65–99)
Glucose-Capillary: 145 mg/dL — ABNORMAL HIGH (ref 65–99)
Glucose-Capillary: 115 mg/dL — ABNORMAL HIGH (ref 65–99)

## 2014-07-22 MED ORDER — INSULIN GLARGINE 100 UNIT/ML ~~LOC~~ SOLN: 5.0000 [IU] | Freq: Every day | SUBCUTANEOUS | Status: DC

## 2014-07-22 MED ORDER — CHLORHEXIDINE GLUCONATE CLOTH 2 % EX PADS
6.0000 | MEDICATED_PAD | Freq: Once | CUTANEOUS | Status: AC
  Administered 2014-07-22: 6 via TOPICAL

## 2014-07-22 MED ORDER — VANCOMYCIN HCL 10 G IV SOLR
2000.0000 mg | Freq: Once | INTRAVENOUS | Status: AC
  Administered 2014-07-22: 2000 mg via INTRAVENOUS
  Filled 2014-07-22: qty 2000

## 2014-07-22 MED ORDER — VANCOMYCIN HCL IN DEXTROSE 1-5 GM/200ML-% IV SOLN
1000.0000 mg | Freq: Two times a day (BID) | INTRAVENOUS | Status: DC
  Administered 2014-07-22 – 2014-07-24 (×4): 1000 mg via INTRAVENOUS
  Filled 2014-07-22 (×4): qty 200

## 2014-07-22 MED ORDER — CHLORHEXIDINE GLUCONATE CLOTH 2 % EX PADS
6.0000 | MEDICATED_PAD | Freq: Once | CUTANEOUS | Status: AC
  Administered 2014-07-23: 6 via TOPICAL

## 2014-07-22 NOTE — Progress Notes (Signed)
ANTIBIOTIC CONSULT NOTE - FOLLOW UP  Pharmacy Consult for Primaxin, vancomycin Indication: intra-abdominal infection  Allergies  Allergen Reactions  . Penicillins Itching and Swelling    Patient Measurements: Height: 5\' 10"  (177.8 cm) Weight: 298 lb 8.1 oz (135.4 kg) IBW/kg (Calculated) : 68.5   Vital Signs: Temp: 98.9 F (37.2 C) (07/15 0541) Temp Source: Oral (07/15 0541) BP: 122/69 mmHg (07/15 0541) Pulse Rate: 100 (07/15 0541) Intake/Output from previous day: 07/14 0701 - 07/15 0700 In: 2095 [P.O.:720; I.V.:1275; IV Piggyback:100] Out: 900 [Urine:900] Intake/Output from this shift: Total I/O In: 120 [P.O.:120] Out: 300 [Urine:300]  Labs:  Recent Labs  07/20/14 2103 07/21/14 0459 07/22/14 0500  WBC 22.9* 20.3* 19.2*  HGB 12.9 11.8* 11.1*  PLT 271 260 226  CREATININE 1.34* 1.16* 1.05*   Estimated Creatinine Clearance: 80.4 mL/min (by C-G formula based on Cr of 1.05). No results for input(s): VANCOTROUGH, VANCOPEAK, VANCORANDOM, GENTTROUGH, GENTPEAK, GENTRANDOM, TOBRATROUGH, TOBRAPEAK, TOBRARND, AMIKACINPEAK, AMIKACINTROU, AMIKACIN in the last 72 hours.   Microbiology: Recent Results (from the past 720 hour(s))  Body fluid culture     Status: None (Preliminary result)   Collection Time: 07/21/14 10:42 AM  Result Value Ref Range Status   Specimen Description COMMON BILE DUCT  Final   Special Requests Immunocompromised  Final   Gram Stain   Final    NO WBC SEEN MODERATE GRAM POSITIVE COCCI IN CLUSTERS Performed at [facility]    Culture PENDING  Incomplete   Report Status PENDING  Incomplete      Assessment: 23 yoF s/p placement of cholecystostomy tube in early June 2016 now with worsening RUQ abdominal pain and low grade fever. Started on Cipro/Flagyl per Rx for intra-abdominal infection, now to switch to Primaxin.  7/14 >> Cipro >> 7/14 7/14 >> Flagyl >> 7/14 7/14 >> Primaxin >>  7/15 >> vancomycin  Temp: 100.3 yesterday WBC: elevated  but trending down Renal: SCr steadily improving; CrCl 60 N  7/14 blood: IP 7/14 fluid from common bile duct: IP; gram stain showed moderate GPC in clusters   Goal of Therapy:  Eradication of infection Appropriate antibiotic dosing for indication and renal function Vancomycin trough 15-20  Plan:  Day 2 antibiotics  Continue Primaxin 500 mg IV q8hr  Orders received to add vancomycin given gram stain result from biliary fluid.  Vancomycin 2 grams IV x 1, then 1 gram IV q12h  Follow-up on culture results.  Follow renal function.  Check vancomycin trough at steady-state unless early de-escalation occurs earlier.  Clayburn Pert, PharmD, BCPS Pager: [phone] 07/22/2014  10:52 AM

## 2014-07-22 NOTE — Progress Notes (Signed)
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 Burr Medico), diabetes mellitus who was hospitalized one month back for sepsis due to acute cholecystitis and had percutaneous cholecystostomy tube placed presented to the ED with sudden onset of right upper quadrant abdominal pain after she had a drain study done on 7/11. This is associated with nausea and was febrile in the ED with mildly elevated LFTs. She also has significant leukocytosis and elevated lactic acid meeting criteria for sepsis. A CT scan of the abdomen and pelvis showing progression of hepatic and metastatic disease.  Assessment/Plan: Sepsis Possibly related to ascending cholangitis versus progressive liver metastases. Placed on Cipro and Flagyl on admission which has been transitioned to Primaxin for broader coverage. Lactic acid resolved with IV fluid bolus. Cholecystostomy drain culture growing GPC. Added empiric vancomycin Follow blood cultures.  Cholangiogram done by IR on 7/11 showed patent drain. Reevaluated by  surgery and recommend  cholecystectomy this admission.. Continue maintenance IV hydration and supportive care with Tylenol and antiemetics. No further abdominal pain symptoms, nuasea or vomiting. -keep NPO   Type 2 diabetes mellitus Monitor on sliding insulin .hold lantus as pt NPO.  Metastatic carcinoid tumor Follows with Dr. Burr Medico. ET on admission shows progressive liver and nodal metastases. On octreotide as outpatient. Seen by Dr Burr Medico and liver targeted therapy by IR once acute illness resolves.  Acute kidney injury Secondary to sepsis and dehydration. Hold Lasix and Monitor with IV fluids   Hyperkalemia Held  spironolactone.   Diet: Clear liquid DVT prophylaxis: Subcutaneous heparin  code Status: Full code Family  Communication: None at bedside Disposition Plan: Monitor as Inpatient   Consultants:  CCS   Dr Burr Medico  Procedures:  CT abd and pelvis  Antibiotics:  IV cipro and flagyl x 1 dose   IV primaxin 7/14--  Iv vancomycin 7/15--  HPI/Subjective:  seen and examined. No further  abdominal pain.   Objective: Filed Vitals:   07/22/14 1444  BP: 148/71  Pulse: 86  Temp: 98.9 F (37.2 C)  Resp: 18    Intake/Output Summary (Last 24 hours) at 07/22/14 1728 Last data filed at 07/22/14 1444  Gross per 24 hour  Intake   2315 ml  Output   1200 ml  Net   1115 ml   Filed Weights   07/22/14 0030  Weight: 135.4 kg (298 lb 8.1 oz)    Exam:   General:   not in distress  HEENT: , moist oral mucosa, supple neck  Chest: Clear to auscultation bilaterally  CVS: Normal S1 and S2, no murmurs rub or gallop  GI: Soft,mild RUQ tenderness,  nondistended, percutaneous biliary drain in place, functioning  Musculoskeletal: Warm, no edema   CNS: Alert and oriented  Data Reviewed: Basic Metabolic Panel:  Recent Labs Lab 07/20/14 2103 07/21/14 0459 07/22/14 0500  NA 132* 134* 141  K 5.7* 3.6 3.3*  CL 96* 99* 108  CO2 25 26 27   GLUCOSE 242* 242* 160*  BUN 15 16 13   CREATININE 1.34* 1.16* 1.05*  CALCIUM 8.6* 8.0* 7.8*   Liver Function Tests:  Recent Labs Lab 07/20/14 2103 07/21/14 0459 07/22/14 0500  AST 110* 54* 28  ALT 25 21 15   ALKPHOS 398* 330* 288*  BILITOT 2.7* 2.1* 1.7*  PROT 8.6* 7.6 7.1  ALBUMIN 3.2* 2.7* 2.4*    Recent Labs Lab 07/20/14 2103  LIPASE <10*    Recent Labs Lab 07/22/14 0900  AMMONIA 30   CBC:  Recent Labs Lab 07/20/14 2103 07/21/14 0459 07/22/14 0500  WBC 22.9* 20.3* 19.2*  NEUTROABS  --  15.4*  --   HGB 12.9 11.8* 11.1*  HCT 40.6 37.4 35.3*  MCV 90.8 90.1 89.4  PLT 271 260 226   Cardiac Enzymes: No results for input(s): CKTOTAL, CKMB, CKMBINDEX, TROPONINI in the last 168 hours. BNP (last 3 results) No results for  input(s): BNP in the last 8760 hours.  ProBNP (last 3 results) No results for input(s): PROBNP in the last 8760 hours.  CBG:  Recent Labs Lab 07/22/14 0154 07/22/14 0538 07/22/14 0742 07/22/14 1229 07/22/14 1618  GLUCAP 145* 138* 151* 157* 121*    Recent Results (from the past 240 hour(s))  Culture, blood (routine x 2)     Status: None (Preliminary result)   Collection Time: 07/21/14  4:59 AM  Result Value Ref Range Status   Specimen Description BLOOD RIGHT HAND  Final   Special Requests BOTTLES DRAWN AEROBIC ONLY 2.5ML  Final   Culture   Final    NO GROWTH 1 DAY Performed at [facility]    Report Status PENDING  Incomplete  Culture, blood (routine x 2)     Status: None (Preliminary result)   Collection Time: 07/21/14  4:59 AM  Result Value Ref Range Status   Specimen Description BLOOD RIGHT ANTECUBITAL  Final   Special Requests BOTTLES DRAWN AEROBIC AND ANAEROBIC 5.5ML  Final   Culture   Final    NO GROWTH 1 DAY Performed at [facility]    Report Status PENDING  Incomplete  Body fluid culture     Status: None (Preliminary result)   Collection Time: 07/21/14 10:42 AM  Result Value Ref Range Status   Specimen Description COMMON BILE DUCT  Final   Special Requests Immunocompromised  Final   Gram Stain   Final    NO WBC SEEN MODERATE GRAM POSITIVE COCCI IN CLUSTERS    Culture   Final    GRAM POSITIVE COCCI Performed at [facility]    Report Status PENDING  Incomplete     Studies: Ct Abdomen Pelvis W Contrast  07/21/2014   CLINICAL DATA:  64 year old female with upper abdominal pain and cholecystostomy tube.  EXAM: CT ABDOMEN AND PELVIS WITH CONTRAST  TECHNIQUE: Multidetector CT imaging of the abdomen and pelvis was performed using the standard protocol following bolus administration of intravenous contrast.  CONTRAST:  132mL OMNIPAQUE IOHEXOL 300 MG/ML  SOLN  COMPARISON:  CT dated 12/27/2013 and ultrasound dated 07/20/2014 appearance   FINDINGS: Bibasilar linear atelectasis/ scarring. Top-normal cardiac size. No intra-abdominal free air. Small perihepatic ascites.  There are innumerable hepatic hypodense lesions compatible with metastatic disease. Direct comparison for interval change in the size of the hepatic lesions since the prior CT is difficult as the lesions were less conspicuous on the prior study. A 3.4 x 3.4 cm lesion seen in the right lobe of the liver. A 5.7 x 8.6 cm area of hypo enhancement is noted in the left lobe of the liver, likely confluence of adjacent metastatic lesions. A percutaneous transhepatic cholecystostomy is noted with tip in the gallbladder. The gallbladder is decompressed. The pancreas is atrophic. The spleen and the right adrenal gland appear unremarkable. There is a 6.7 x 5.7 (previously 6.0 x 5.3 cm) heterogeneous centrally necrotic left adrenal mass. The kidneys and urinary bladder appear unremarkable. Hysterectomy.  Moderate stool noted throughout the colon. There is no evidence of bowel obstruction or inflammation. The visualized abdominal aorta and IVC appear patent. No portal venous gas identified. There is retroperitoneal and mesenteric adenopathy. A 5.5 x 3.1 cm (previously 4.4 by 2.8 cm) partially calcified ovoid soft tissue mesentery mass noted in the lower abdomen. A small nodular density adjacent to the spleen appears similar as the prior study and likely represents a splenic. Small nodular densities at the right cardiophrenic angle represent small lymph nodes. Metastatic disease is less likely but not excluded.  Midline vertical anterior pelvic wall incisional scar. Degenerative changes of the spine with multilevel osteophyte formation. No acute fracture.  IMPRESSION: Interval progression of the hepatic and nodal metastatic disease. There is interval increase in the size of the left adrenal mass and mesenteric mass/lymph node.  Percutaneous transhepatic cholecystostomy. No calcified gallstone or  biliary ductal dilatation.  No evidence of bowel obstruction or inflammation.  Normal appendix.   Electronically Signed   By: Anner Crete M.D.   On: 07/21/2014 01:09   US Abdomen Limited  07/20/2014   CLINICAL DATA:  Right upper quadrant pain after surgery on Monday. Gallbladder drain.  EXAM: US ABDOMEN LIMITED - RIGHT UPPER QUADRANT  COMPARISON:  CT abdomen and pelvis 06/01/2014  FINDINGS: Examination is technically limited due to patient's body habitus and bowel gas.  Gallbladder:  Gallbladder is not visualized. It is likely decompressed with gallbladder drain in place.  Common bile duct:  Diameter: 2.5 mm, normal  Liver:  Diffusely heterogeneous hepatic parenchymal echotexture with nodular contour. Specific lesions are poorly defined, but the appearance is consistent with known hepatic metastases.  IMPRESSION: Gallbladder is not visualized and is likely decompressed. No bile duct dilatation. Heterogeneous parenchymal appearance of the liver consistent with known metastasis.   Electronically Signed   By: Lucienne Capers M.D.   On: 07/20/2014 23:14    Scheduled Meds: . Chlorhexidine Gluconate Cloth  6 each Topical Once  . [START ON 07/23/2014] Chlorhexidine Gluconate Cloth  6 each Topical Once  . gabapentin  400 mg Oral TID  . heparin subcutaneous  5,000 Units Subcutaneous 3 times per day  . imipenem-cilastatin  500 mg Intravenous 3 times per day  . insulin aspart  0-9 Units Subcutaneous Q4H  . insulin glargine  10 Units Subcutaneous BID  . saccharomyces boulardii  250 mg Oral BID  . sodium chloride  3 mL Intravenous Q12H  . [START ON 07/23/2014] vancomycin  1,000 mg Intravenous Q12H   Continuous Infusions:    Time spent: 25 minutes    Armarion Greek, [place]  [facility] Pager [phone] If 7PM-7AM, please contact night-coverage at [email or web address], password [email or web address] 07/22/2014, 5:28 PM  LOS: 1 day

## 2014-07-22 NOTE — Progress Notes (Signed)
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  Make NPO now for possible surgery later today (vs in AM) -wean IVF -IV ABx for GPC in drain - Invanz & f/u cultures -VTE prophylaxis- SCDs, etc -mobilize as tolerated to help recovery  -standard of care is interval chole/IOC 6-8 weeks after drain placement.  With stones in GB, I worry recurrent cholecystitis risk high if drain removed.  Challenge w her fair health/deconditioning & progressive liver disease.     I think patient will need need chole this admission.  That would break the cycle of recurrent infections & allow that to recover enough so far she can focus on Tx her progressing liver carcinoid disease.  Seen by Dr Burr Medico w Med [facility] whom feels pt needs switch in chemoTx that cannot happen until GB / cholangitis issues resolved.   Defer to the inpatient LDOW surgeons covering, see if they have time.  The anatomy & physiology of hepatobiliary & pancreatic function was discussed.  The pathophysiology of gallbladder dysfunction was discussed.  Natural history risks without surgery was discussed.   I feel the risks of no intervention will lead to serious problems that outweigh the operative risks; therefore, I recommended cholecystectomy to remove the pathology.  I explained laparoscopic techniques with possible need for an open approach.  Probable cholangiogram to evaluate the bilary tract was explained as well.    Risks such as bleeding, infection, abscess, leak, injury to other organs, need for further  treatment, stroke, heart attack, death, and other risks were discussed.  I noted a good likelihood this will help address the problem.  Possibility that this will not correct all abdominal symptoms was explained.  Goals of post-operative recovery were discussed as well.  We will work to minimize complications.  An educational handout further explaining the pathology and treatment options was given as well.  Questions were answered.  The patient expresses understanding & wishes to proceed with surgery.   Could do liver biopsy 14G Tru-Cut at same time of chole if done soon  Adin Hector, M.D., F.A.C.S. Gastrointestinal and Minimally Invasive Surgery [facility] [address] [place], [place] [address] [phone] Main / Paging   07/22/2014  Subjective:  No more pain Tired Appetite fair  Objective:  Vital signs:  Filed Vitals:   07/21/14 1436 07/21/14 2205 07/22/14 0030 07/22/14 0541  BP: 123/60 119/59  122/69  Pulse: 96 96  100  Temp: 99.1 F (37.3 C) 100.3 F (37.9 C)  98.9 F (37.2 C)  TempSrc: Oral Oral  Oral  Resp: '18 18  18  ' Height:  '5\' 10"'  (1.778 m)    Weight:   135.4 kg (298 lb 8.1 oz)   SpO2: 95% 100%  100%       Intake/Output   Yesterday:  07/14 0701 - 07/15 0700 In: 2095 [P.O.:720; I.V.:1275; IV Piggyback:100] Out: 900 [Urine:900] This shift:     Bowel function:  Flatus: y  BM: n  Drain: bilious - no  pus  Physical Exam:  General: Pt awake/alert/oriented x4 in no acute distress.  Tired, not toxic  Eyes: PERRL, normal EOM.  Sclera clear.  No icterus Neuro: CN II-XII intact w/o focal sensory/motor deficits. Lymph: No head/neck/groin lymphadenopathy Psych:  No delerium/psychosis/paranoia HENT: Normocephalic, Mucus membranes moist.  No thrush Neck: Supple, No tracheal deviation Chest: No chest wall pain w good excursion CV:  Pulses intact.  Regular rhythm MS: Normal AROM mjr joints.  No obvious  deformity Abdomen: Soft.  Nondistended.  Mildly tender at RUQ drain site only.  No evidence of peritonitis.  No incarcerated hernias. Ext:  SCDs BLE.  No mjr edema.  No cyanosis Skin: No petechiae / purpura  Results:   Labs: Results for orders placed or performed during the hospital encounter of 07/20/14 (from the past 48 hour(s))  Urinalysis, Routine w reflex microscopic (not at [facility])     Status: Abnormal   Collection Time: 07/20/14  8:33 PM  Result Value Ref Range   Color, Urine ORANGE (A) YELLOW    Comment: BIOCHEMICALS MAY BE AFFECTED BY COLOR   APPearance CLOUDY (A) CLEAR   Specific Gravity, Urine 1.029 1.005 - 1.030   pH 5.5 5.0 - 8.0   Glucose, UA NEGATIVE NEGATIVE mg/dL   Hgb urine dipstick NEGATIVE NEGATIVE   Bilirubin Urine MODERATE (A) NEGATIVE   Ketones, ur NEGATIVE NEGATIVE mg/dL   Protein, ur 30 (A) NEGATIVE mg/dL   Urobilinogen, UA 1.0 0.0 - 1.0 mg/dL   Nitrite NEGATIVE NEGATIVE   Leukocytes, UA SMALL (A) NEGATIVE  Urine microscopic-add on     Status: Abnormal   Collection Time: 07/20/14  8:33 PM  Result Value Ref Range   Squamous Epithelial / LPF MANY (A) RARE   WBC, UA 3-6 <3 WBC/hpf   Bacteria, UA FEW (A) RARE   Casts HYALINE CASTS (A) NEGATIVE  Lipase, blood     Status: Abnormal   Collection Time: 07/20/14  9:03 PM  Result Value Ref Range   Lipase <10 (L) 22 - 51 U/L  Comprehensive metabolic panel     Status: Abnormal   Collection Time: 07/20/14  9:03 PM  Result Value Ref Range   Sodium 132 (L) 135 - 145 mmol/L   Potassium 5.7 (H) 3.5 - 5.1 mmol/L    Comment: SLIGHT HEMOLYSIS HEMOLYSIS AT THIS LEVEL MAY AFFECT RESULT    Chloride 96 (L) 101 - 111 mmol/L   CO2 25 22 - 32 mmol/L   Glucose, Bld 242 (H) 65 - 99 mg/dL   BUN 15 6 - 20 mg/dL   Creatinine, Ser 1.34 (H) 0.44 - 1.00 mg/dL   Calcium 8.6 (L) 8.9 - 10.3 mg/dL   Total Protein 8.6 (H) 6.5 - 8.1 g/dL   Albumin 3.2 (L) 3.5 - 5.0 g/dL   AST 110 (H) 15 - 41 U/L    Comment: SLIGHT  HEMOLYSIS HEMOLYSIS AT THIS LEVEL MAY AFFECT RESULT    ALT 25 14 - 54 U/L    Comment: SLIGHT HEMOLYSIS HEMOLYSIS AT THIS LEVEL MAY AFFECT RESULT    Alkaline Phosphatase 398 (H) 38 - 126 U/L    Comment: SLIGHT HEMOLYSIS HEMOLYSIS AT THIS LEVEL MAY AFFECT RESULT    Total Bilirubin 2.7 (H) 0.3 - 1.2 mg/dL    Comment: SLIGHT HEMOLYSIS HEMOLYSIS AT THIS LEVEL MAY AFFECT RESULT    GFR calc non Af Amer 41 (L) >60 mL/min   GFR calc Af Amer 47 (L) >60 mL/min    Comment: (NOTE) The eGFR has  been calculated using the CKD EPI equation. This calculation has not been validated in all clinical situations. eGFR's persistently <60 mL/min signify possible Chronic Kidney Disease.    Anion gap 11 5 - 15  CBC     Status: Abnormal   Collection Time: 07/20/14  9:03 PM  Result Value Ref Range   WBC 22.9 (H) 4.0 - 10.5 K/uL   RBC 4.47 3.87 - 5.11 MIL/uL   Hemoglobin 12.9 12.0 - 15.0 g/dL   HCT 40.6 36.0 - 46.0 %   MCV 90.8 78.0 - 100.0 fL   MCH 28.9 26.0 - 34.0 pg   MCHC 31.8 30.0 - 36.0 g/dL   RDW 14.0 11.5 - 15.5 %   Platelets 271 150 - 400 K/uL  Hepatic function panel     Status: Abnormal   Collection Time: 07/21/14  4:59 AM  Result Value Ref Range   Total Protein 7.6 6.5 - 8.1 g/dL   Albumin 2.7 (L) 3.5 - 5.0 g/dL   AST 54 (H) 15 - 41 U/L   ALT 21 14 - 54 U/L   Alkaline Phosphatase 330 (H) 38 - 126 U/L   Total Bilirubin 2.1 (H) 0.3 - 1.2 mg/dL   Bilirubin, Direct 0.8 (H) 0.1 - 0.5 mg/dL   Indirect Bilirubin 1.3 (H) 0.3 - 0.9 mg/dL  Basic metabolic panel     Status: Abnormal   Collection Time: 07/21/14  4:59 AM  Result Value Ref Range   Sodium 134 (L) 135 - 145 mmol/L   Potassium 3.6 3.5 - 5.1 mmol/L    Comment: DELTA CHECK NOTED REPEATED TO VERIFY    Chloride 99 (L) 101 - 111 mmol/L   CO2 26 22 - 32 mmol/L   Glucose, Bld 242 (H) 65 - 99 mg/dL   BUN 16 6 - 20 mg/dL   Creatinine, Ser 1.16 (H) 0.44 - 1.00 mg/dL   Calcium 8.0 (L) 8.9 - 10.3 mg/dL   GFR calc non Af Amer 48 (L) >60  mL/min   GFR calc Af Amer 56 (L) >60 mL/min    Comment: (NOTE) The eGFR has been calculated using the CKD EPI equation. This calculation has not been validated in all clinical situations. eGFR's persistently <60 mL/min signify possible Chronic Kidney Disease.    Anion gap 9 5 - 15  CBC WITH DIFFERENTIAL     Status: Abnormal   Collection Time: 07/21/14  4:59 AM  Result Value Ref Range   WBC 20.3 (H) 4.0 - 10.5 K/uL   RBC 4.15 3.87 - 5.11 MIL/uL   Hemoglobin 11.8 (L) 12.0 - 15.0 g/dL   HCT 37.4 36.0 - 46.0 %   MCV 90.1 78.0 - 100.0 fL   MCH 28.4 26.0 - 34.0 pg   MCHC 31.6 30.0 - 36.0 g/dL   RDW 13.9 11.5 - 15.5 %   Platelets 260 150 - 400 K/uL   Neutrophils Relative % 76 43 - 77 %   Neutro Abs 15.4 (H) 1.7 - 7.7 K/uL   Lymphocytes Relative 6 (L) 12 - 46 %   Lymphs Abs 1.3 0.7 - 4.0 K/uL   Monocytes Relative 18 (H) 3 - 12 %   Monocytes Absolute 3.6 (H) 0.1 - 1.0 K/uL   Eosinophils Relative 0 0 - 5 %   Eosinophils Absolute 0.0 0.0 - 0.7 K/uL   Basophils Relative 0 0 - 1 %   Basophils Absolute 0.0 0.0 - 0.1 K/uL  Procalcitonin - Baseline     Status: None  Collection Time: 07/21/14  4:59 AM  Result Value Ref Range   Procalcitonin 3.45 ng/mL    Comment:        Interpretation: PCT > 2 ng/mL: Systemic infection (sepsis) is likely, unless other causes are known. (NOTE)         ICU PCT Algorithm               Non ICU PCT Algorithm    ----------------------------     ------------------------------         PCT < 0.25 ng/mL                 PCT < 0.1 ng/mL     Stopping of antibiotics            Stopping of antibiotics       strongly encouraged.               strongly encouraged.    ----------------------------     ------------------------------       PCT level decrease by               PCT < 0.25 ng/mL       >= 80% from peak PCT       OR PCT 0.25 - 0.5 ng/mL          Stopping of antibiotics                                             encouraged.     Stopping of antibiotics            encouraged.    ----------------------------     ------------------------------       PCT level decrease by              PCT >= 0.25 ng/mL       < 80% from peak PCT        AND PCT >= 0.5 ng/mL            Continuing antibiotics                                               encouraged.       Continuing antibiotics            encouraged.    ----------------------------     ------------------------------     PCT level increase compared          PCT > 0.5 ng/mL         with peak PCT AND          PCT >= 0.5 ng/mL             Escalation of antibiotics                                          strongly encouraged.      Escalation of antibiotics        strongly encouraged.   Glucose, capillary     Status: Abnormal   Collection Time: 07/21/14  6:56 AM  Result Value Ref Range   Glucose-Capillary 199 (H) 65 - 99 mg/dL  Lactic acid, plasma  Status: None   Collection Time: 07/21/14  7:00 AM  Result Value Ref Range   Lactic Acid, Venous 1.7 0.5 - 2.0 mmol/L  Glucose, capillary     Status: Abnormal   Collection Time: 07/21/14  7:50 AM  Result Value Ref Range   Glucose-Capillary 203 (H) 65 - 99 mg/dL  Lactic acid, plasma     Status: Abnormal   Collection Time: 07/21/14  9:42 AM  Result Value Ref Range   Lactic Acid, Venous 3.1 (HH) 0.5 - 2.0 mmol/L    Comment: REPEATED TO VERIFY CRITICAL RESULT CALLED TO, READ BACK BY AND VERIFIED WITH: GIBBS,B. '@1045'  ON 7.14.16 BY MCCOY, N.   Body fluid culture     Status: None (Preliminary result)   Collection Time: 07/21/14 10:42 AM  Result Value Ref Range   Specimen Description COMMON BILE DUCT    Special Requests Immunocompromised    Gram Stain      NO WBC SEEN MODERATE GRAM POSITIVE COCCI IN CLUSTERS Performed at [facility]    Culture PENDING    Report Status PENDING   Glucose, capillary     Status: Abnormal   Collection Time: 07/21/14 12:30 PM  Result Value Ref Range   Glucose-Capillary 176 (H) 65 - 99 mg/dL  Glucose,  capillary     Status: Abnormal   Collection Time: 07/21/14  4:52 PM  Result Value Ref Range   Glucose-Capillary 160 (H) 65 - 99 mg/dL  Lactic acid, plasma     Status: Abnormal   Collection Time: 07/21/14  7:57 PM  Result Value Ref Range   Lactic Acid, Venous 2.3 (HH) 0.5 - 2.0 mmol/L    Comment: REPEATED TO VERIFY CRITICAL RESULT CALLED TO, READ BACK BY AND VERIFIED WITH: Lonni Fix [id number] @ 2042 BY J SCOTTON   Glucose, capillary     Status: Abnormal   Collection Time: 07/21/14 10:06 PM  Result Value Ref Range   Glucose-Capillary 178 (H) 65 - 99 mg/dL  Lactic acid, plasma     Status: None   Collection Time: 07/21/14 11:05 PM  Result Value Ref Range   Lactic Acid, Venous 1.2 0.5 - 2.0 mmol/L  Glucose, capillary     Status: Abnormal   Collection Time: 07/22/14  1:54 AM  Result Value Ref Range   Glucose-Capillary 145 (H) 65 - 99 mg/dL  CBC     Status: Abnormal   Collection Time: 07/22/14  5:00 AM  Result Value Ref Range   WBC 19.2 (H) 4.0 - 10.5 K/uL   RBC 3.95 3.87 - 5.11 MIL/uL   Hemoglobin 11.1 (L) 12.0 - 15.0 g/dL   HCT 35.3 (L) 36.0 - 46.0 %   MCV 89.4 78.0 - 100.0 fL   MCH 28.1 26.0 - 34.0 pg   MCHC 31.4 30.0 - 36.0 g/dL   RDW 13.9 11.5 - 15.5 %   Platelets 226 150 - 400 K/uL  Comprehensive metabolic panel     Status: Abnormal   Collection Time: 07/22/14  5:00 AM  Result Value Ref Range   Sodium 141 135 - 145 mmol/L    Comment: DELTA CHECK NOTED REPEATED TO VERIFY    Potassium 3.3 (L) 3.5 - 5.1 mmol/L   Chloride 108 101 - 111 mmol/L   CO2 27 22 - 32 mmol/L   Glucose, Bld 160 (H) 65 - 99 mg/dL   BUN 13 6 - 20 mg/dL   Creatinine, Ser 1.05 (H) 0.44 - 1.00 mg/dL   Calcium 7.8 (L) 8.9 -  10.3 mg/dL   Total Protein 7.1 6.5 - 8.1 g/dL   Albumin 2.4 (L) 3.5 - 5.0 g/dL   AST 28 15 - 41 U/L   ALT 15 14 - 54 U/L   Alkaline Phosphatase 288 (H) 38 - 126 U/L   Total Bilirubin 1.7 (H) 0.3 - 1.2 mg/dL   GFR calc non Af Amer 55 (L) >60 mL/min   GFR calc Af Amer >60 >60  mL/min    Comment: (NOTE) The eGFR has been calculated using the CKD EPI equation. This calculation has not been validated in all clinical situations. eGFR's persistently <60 mL/min signify possible Chronic Kidney Disease.    Anion gap 6 5 - 15  Glucose, capillary     Status: Abnormal   Collection Time: 07/22/14  5:38 AM  Result Value Ref Range   Glucose-Capillary 138 (H) 65 - 99 mg/dL  Glucose, capillary     Status: Abnormal   Collection Time: 07/22/14  7:42 AM  Result Value Ref Range   Glucose-Capillary 151 (H) 65 - 99 mg/dL    Imaging / Studies: Ct Abdomen Pelvis W Contrast  07/21/2014   CLINICAL DATA:  64 year old female with upper abdominal pain and cholecystostomy tube.  EXAM: CT ABDOMEN AND PELVIS WITH CONTRAST  TECHNIQUE: Multidetector CT imaging of the abdomen and pelvis was performed using the standard protocol following bolus administration of intravenous contrast.  CONTRAST:  127m OMNIPAQUE IOHEXOL 300 MG/ML  SOLN  COMPARISON:  CT dated 12/27/2013 and ultrasound dated 07/20/2014 appearance  FINDINGS: Bibasilar linear atelectasis/ scarring. Top-normal cardiac size. No intra-abdominal free air. Small perihepatic ascites.  There are innumerable hepatic hypodense lesions compatible with metastatic disease. Direct comparison for interval change in the size of the hepatic lesions since the prior CT is difficult as the lesions were less conspicuous on the prior study. A 3.4 x 3.4 cm lesion seen in the right lobe of the liver. A 5.7 x 8.6 cm area of hypo enhancement is noted in the left lobe of the liver, likely confluence of adjacent metastatic lesions. A percutaneous transhepatic cholecystostomy is noted with tip in the gallbladder. The gallbladder is decompressed. The pancreas is atrophic. The spleen and the right adrenal gland appear unremarkable. There is a 6.7 x 5.7 (previously 6.0 x 5.3 cm) heterogeneous centrally necrotic left adrenal mass. The kidneys and urinary bladder appear  unremarkable. Hysterectomy.  Moderate stool noted throughout the colon. There is no evidence of bowel obstruction or inflammation. The visualized abdominal aorta and IVC appear patent. No portal venous gas identified. There is retroperitoneal and mesenteric adenopathy. A 5.5 x 3.1 cm (previously 4.4 by 2.8 cm) partially calcified ovoid soft tissue mesentery mass noted in the lower abdomen. A small nodular density adjacent to the spleen appears similar as the prior study and likely represents a splenic. Small nodular densities at the right cardiophrenic angle represent small lymph nodes. Metastatic disease is less likely but not excluded.  Midline vertical anterior pelvic wall incisional scar. Degenerative changes of the spine with multilevel osteophyte formation. No acute fracture.  IMPRESSION: Interval progression of the hepatic and nodal metastatic disease. There is interval increase in the size of the left adrenal mass and mesenteric mass/lymph node.  Percutaneous transhepatic cholecystostomy. No calcified gallstone or biliary ductal dilatation.  No evidence of bowel obstruction or inflammation.  Normal appendix.   Electronically Signed   By: AAnner CreteM.D.   On: 07/21/2014 01:09   UKoreaAbdomen Limited  07/20/2014   CLINICAL DATA:  Right upper quadrant pain after surgery on Monday. Gallbladder drain.  EXAM: US ABDOMEN LIMITED - RIGHT UPPER QUADRANT  COMPARISON:  CT abdomen and pelvis 06/01/2014  FINDINGS: Examination is technically limited due to patient's body habitus and bowel gas.  Gallbladder:  Gallbladder is not visualized. It is likely decompressed with gallbladder drain in place.  Common bile duct:  Diameter: 2.5 mm, normal  Liver:  Diffusely heterogeneous hepatic parenchymal echotexture with nodular contour. Specific lesions are poorly defined, but the appearance is consistent with known hepatic metastases.  IMPRESSION: Gallbladder is not visualized and is likely decompressed. No bile duct  dilatation. Heterogeneous parenchymal appearance of the liver consistent with known metastasis.   Electronically Signed   By: Lucienne Capers M.D.   On: 07/20/2014 23:14    Medications / Allergies: per chart  Antibiotics: Anti-infectives    Start     Dose/Rate Route Frequency Ordered Stop   07/21/14 1400  imipenem-cilastatin (PRIMAXIN) 500 mg in sodium chloride 0.9 % 100 mL IVPB     500 mg 200 mL/hr over 30 Minutes Intravenous 3 times per day 07/21/14 0824     07/21/14 1000  ciprofloxacin (CIPRO) IVPB 400 mg  Status:  Discontinued     400 mg 200 mL/hr over 60 Minutes Intravenous Every 12 hours 07/21/14 0622 07/21/14 0803   07/21/14 0830  imipenem-cilastatin (PRIMAXIN) 500 mg in sodium chloride 0.9 % 100 mL IVPB  Status:  Discontinued     500 mg 200 mL/hr over 30 Minutes Intravenous 3 times per day 07/21/14 0822 07/21/14 0824   07/21/14 0800  metroNIDAZOLE (FLAGYL) IVPB 500 mg  Status:  Discontinued     500 mg 100 mL/hr over 60 Minutes Intravenous Every 8 hours 07/21/14 0622 07/21/14 1018   07/20/14 2200  ciprofloxacin (CIPRO) IVPB 400 mg     400 mg 200 mL/hr over 60 Minutes Intravenous  Once 07/20/14 2154 07/21/14 0059   07/20/14 2200  metroNIDAZOLE (FLAGYL) IVPB 500 mg     500 mg 100 mL/hr over 60 Minutes Intravenous  Once 07/20/14 2154 07/21/14 0414        Note: Portions of this report may have been transcribed using voice recognition software. Every effort was made to ensure accuracy; however, inadvertent computerized transcription errors may be present.   Any transcriptional errors that result from this process are unintentional.     Adin Hector, M.D., F.A.C.S. Gastrointestinal and Minimally Invasive Surgery [facility] [address], [place], [place] [employer] [address] [phone] Main / Paging   07/22/2014  CARE TEAM:  PCP: Isaias Cowman, PA-C  Outpatient Care Team: Patient Care Team: Secundino Ginger, PA-C as PCP - General  (Cardiology)  Inpatient Treatment Team: Treatment Team: Attending Provider: Louellen Molder, MD; Rounding Team: Ian Bushman, MD; Consulting Physician: Truitt Merle, MD; Consulting Physician: Nolon Nations, MD; Physical Therapist: Lucile Crater, PT

## 2014-07-22 NOTE — Progress Notes (Signed)
Initial Nutrition Assessment  DOCUMENTATION CODES:   Severe malnutrition in context of acute illness/injury, Morbid obesity  INTERVENTION:   Diet advancement per MD When diet is advanced, pt would like Ensure Enlive po BID, each supplement provides 350 kcal and 20 grams of protein RD to continue to monitor  NUTRITION DIAGNOSIS:   Malnutrition related to acute illness as evidenced by percent weight loss, energy intake < or equal to 50% for > or equal to 5 days.  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 Burr Medico), diabetes mellitus who was hospitalized one month back for sepsis due to acute cholecystitis and had percutaneous cholecystostomy tube placed presented to the ED with sudden onset of right upper quadrant abdominal pain after she had a drain study done on 7/11. This is associated with nausea and was febrile in the ED with mildly elevated LFTs.  Per surgery note, pt may have surgery today. Pt currently NPO. Pt reports poor appetite x weeks PTA. Pt states she was eating grits daily and that was all. Pt has had 26 lb weight loss since 6/04 (8% weight loss x 1.5 months, significant for time frame).  Pt states she drinks Ensure supplements at home. Wants Ensures when her diet is advanced.  Nutrition focused physical exam shows no sign of depletion of muscle mass or body fat.  Labs reviewed: CBGs: 138-151 Low K Elevated Creatinine  Diet Order:  Diet NPO time specified Except for: Ice Chips Diet NPO time specified  Skin:  Reviewed, no issues  Last BM:  PTA  Height:   Ht Readings from Last 1 Encounters:  07/21/14 5\' 10"  (1.778 m)    Weight:   Wt Readings from Last 1 Encounters:  07/22/14 298 lb 8.1 oz  (135.4 kg)    Ideal Body Weight:  68.1 kg  Wt Readings from Last 10 Encounters:  07/22/14 298 lb 8.1 oz (135.4 kg)  06/11/14 324 lb 8.3 oz (147.2 kg)  05/26/14 327 lb 6.4 oz (148.508 kg)  03/03/14 331 lb 1.6 oz (150.186 kg)  01/03/14 344 lb 1.6 oz (156.083 kg)  12/06/13 338 lb 6.4 oz (153.497 kg)  11/05/13 332 lb 8 oz (150.821 kg)  10/07/13 336 lb 12.8 oz (152.771 kg)  09/02/13 333 lb (151.048 kg)  08/28/13 327 lb 9.7 oz (148.6 kg)    BMI:  Body mass index is 42.83 kg/(m^2).  Estimated Nutritional Needs:   Kcal:  2000-2200  Protein:  85-95g  Fluid:  2L/day  EDUCATION NEEDS:   No education needs identified at this time  Clayton Bibles, MS, RD, LDN Pager: [phone] After Hours Pager: [phone]

## 2014-07-22 NOTE — Evaluation (Signed)
Physical Therapy Evaluation Patient Details Name: Yesenia Lyons MRN: [id number] DOB: [birth year] Today's Date: 07/22/2014   History of Present Illness  64 yo female admitted with cholangitis. Hx of obesity, HTN, DM, CHF, metastatic carcinoid tumor. Recent admitssion for cholecystostomy, DC 06/11/14.   Clinical Impression  Pt admitted with above diagnosis. Pt currently with functional limitations due to the deficits listed below (see PT Problem List). Pt walked 42' with RW with min/guard assist. She fatigues quickly. She declined ST-SNF, she prefers to DC to her daughter's home.  Pt will benefit from skilled PT to increase their independence and safety with mobility to allow discharge to the venue listed below.       Follow Up Recommendations Home health PT    Equipment Recommendations  None recommended by PT    Recommendations for Other Services OT consult     Precautions / Restrictions Precautions Precautions: Fall Precaution Comments: pt denies h/o falls Restrictions Weight Bearing Restrictions: No      Mobility  Bed Mobility               General bed mobility comments: NT-pt up at EOB  Transfers Overall transfer level: Needs assistance Equipment used: Rolling walker (2 wheeled) Transfers: Sit to/from Stand Sit to Stand: Min guard;From elevated surface         General transfer comment: no physical assist needed from elevated bed  Ambulation/Gait Ambulation/Gait assistance: Min guard Ambulation Distance (Feet): 38 Feet Assistive device: Rolling walker (2 wheeled) Gait Pattern/deviations: Step-through pattern Gait velocity: slow Gait velocity interpretation: Below normal speed for age/gender General Gait Details: distance limited by fatigue, 2/4 dyspnea, pt states at baseline she can walk farther  [employer]            Wheelchair Mobility    Modified Rankin (Stroke Patients Only)       Balance Overall balance assessment: Needs assistance   Sitting  balance-Leahy Scale: Good     Standing balance support: Bilateral upper extremity supported Standing balance-Leahy Scale: Poor Standing balance comment: requires BUE support                             Pertinent Vitals/Pain Pain Assessment: 0-10 Pain Score: 2  Pain Location: drain site Pain Descriptors / Indicators: Sore Pain Intervention(s): Ice applied;Limited activity within patient's tolerance;Monitored during session    Home Living Family/patient expects to be discharged to:: Private residence Living Arrangements: Children Available Help at Discharge: Family;Available PRN/intermittently (daughter works, nieces can check in on her) Type of Home: House Home Access: Level entry     Home Layout: One level Home Equipment: Environmental consultant - 4 wheels;Shower seat;Bedside commode;Wheelchair - manual Additional Comments: Pt is home alone during the day    Prior Function Level of Independence: Independent with assistive device(s)         Comments: uses rollator, sits at sink to bathe, uses WC for long distances     Hand Dominance        Extremity/Trunk Assessment   Upper Extremity Assessment: RUE deficits/detail RUE Deficits / Details: unable to elevate R shoulder, pt states this is chronic         Lower Extremity Assessment: RLE deficits/detail;LLE deficits/detail RLE Deficits / Details: knee ext 2/5, decr sensation to light touch R foot, ankle WFL LLE Deficits / Details: knee ext +2/5, decr sensation to light touch, ankle WFL  Cervical / Trunk Assessment: Normal  Communication   Communication: No difficulties  Cognition Arousal/Alertness:  Awake/alert Behavior During Therapy: WFL for tasks assessed/performed Overall Cognitive Status: Within Functional Limits for tasks assessed                      General Comments      Exercises        Assessment/Plan    PT Assessment Patient needs continued PT services  PT Diagnosis Difficulty  walking;Generalized weakness;Acute pain   PT Problem List Decreased strength;Decreased activity tolerance;Decreased balance;Pain;Decreased mobility;Obesity  PT Treatment Interventions Gait training;Functional mobility training;Therapeutic activities;Patient/family education;Therapeutic exercise;Balance training   PT Goals (Current goals can be found in the Care Plan section) Acute Rehab PT Goals Patient Stated Goal: to get around better PT Goal Formulation: With patient Time For Goal Achievement: 08/05/14 Potential to Achieve Goals: Fair    Frequency Min 3X/week   Barriers to discharge Decreased caregiver support pt stated she's not willing to consider ST-SNF, wants to DC to daughter's home    Co-evaluation               End of Session Equipment Utilized During Treatment: Gait belt Activity Tolerance: Patient limited by fatigue;Patient tolerated treatment well Patient left: in chair;with call bell/phone within reach Nurse Communication: Mobility status         Time: 1572-6203 PT Time Calculation (min) (ACUTE ONLY): 26 min   Charges:   PT Evaluation $Initial PT Evaluation Tier I: 1 Procedure PT Treatments $Gait Training: 8-22 mins   PT G Codes:        Philomena Doheny 07/22/2014, 9:39 AM [phone]

## 2014-07-23 LAB — CBC
HCT: 34 % — ABNORMAL LOW (ref 36.0–46.0)
HEMOGLOBIN: 10.3 g/dL — AB (ref 12.0–15.0)
MCH: 27.9 pg (ref 26.0–34.0)
MCHC: 30.3 g/dL (ref 30.0–36.0)
MCV: 92.1 fL (ref 78.0–100.0)
PLATELETS: 262 10*3/uL (ref 150–400)
RBC: 3.69 MIL/uL — AB (ref 3.87–5.11)
RDW: 14.3 % (ref 11.5–15.5)
WBC: 13.8 10*3/uL — ABNORMAL HIGH (ref 4.0–10.5)

## 2014-07-23 LAB — GLUCOSE, CAPILLARY
GLUCOSE-CAPILLARY: 123 mg/dL — AB (ref 65–99)
GLUCOSE-CAPILLARY: 123 mg/dL — AB (ref 65–99)
GLUCOSE-CAPILLARY: 135 mg/dL — AB (ref 65–99)
GLUCOSE-CAPILLARY: 213 mg/dL — AB (ref 65–99)
GLUCOSE-CAPILLARY: 262 mg/dL — AB (ref 65–99)
GLUCOSE-CAPILLARY: 85 mg/dL (ref 65–99)
Glucose-Capillary: 123 mg/dL — ABNORMAL HIGH (ref 65–99)
Glucose-Capillary: 127 mg/dL — ABNORMAL HIGH (ref 65–99)

## 2014-07-23 LAB — PROCALCITONIN: PROCALCITONIN: 2.83 ng/mL

## 2014-07-23 MED ORDER — INSULIN GLARGINE 100 UNIT/ML ~~LOC~~ SOLN
10.0000 [IU] | Freq: Two times a day (BID) | SUBCUTANEOUS | Status: DC
  Administered 2014-07-23 – 2014-08-01 (×19): 10 [IU] via SUBCUTANEOUS
  Filled 2014-07-23 (×22): qty 0.1

## 2014-07-23 MED ORDER — SODIUM CHLORIDE 0.9 % IJ SOLN
10.0000 mL | INTRAMUSCULAR | Status: DC | PRN
  Administered 2014-07-23: 10 mL
  Administered 2014-07-24: 20 mL
  Administered 2014-07-24 – 2014-07-31 (×8): 10 mL
  Filled 2014-07-23 (×10): qty 40

## 2014-07-23 NOTE — Progress Notes (Signed)
Dear Doctor: Yesenia Lyons This patient has been identified as a candidate for PICC for the following reason (s): IV therapy over 48 hours, poor veins/poor circulatory system (CHF, COPD, emphysema, diabetes, steroid use, IV drug abuse, etc.) and restarts due to phlebitis and infiltration in 24 hours If you agree, please write an order for the indicated device. For any questions contact the Vascular Access Team at [phone] if no answer, please leave a message.  Thank you for supporting the early vascular access assessment program.

## 2014-07-23 NOTE — Progress Notes (Signed)
Peripherally Inserted Central Catheter/Midline Placement  The IV Nurse has discussed with the patient and/or persons authorized to consent for the patient, the purpose of this procedure and the potential benefits and risks involved with this procedure.  The benefits include less needle sticks, lab draws from the catheter and patient may be discharged home with the catheter.  Risks include, but not limited to, infection, bleeding, blood clot (thrombus formation), and puncture of an artery; nerve damage and irregular heat beat.  Alternatives to this procedure were also discussed.  PICC/Midline Placement Documentation  PICC / Midline Double Lumen 07/23/14 PICC Right Brachial 51 cm 0 cm (Active)  Indication for Insertion or Continuance of Line Limited venous access - need for IV therapy >5 days (PICC only);Prolonged intravenous therapies 07/23/2014  5:21 PM  Exposed Catheter (cm) 0 cm 07/23/2014  5:21 PM  Site Assessment Clean;Dry;Intact 07/23/2014  5:21 PM  Lumen #1 Status Flushed;Saline locked;Blood return noted 07/23/2014  5:21 PM  Lumen #2 Status Flushed;Saline locked;Blood return noted 07/23/2014  5:21 PM  Dressing Type Transparent 07/23/2014  5:21 PM  Dressing Status Clean;Dry;Intact;Antimicrobial disc in place 07/23/2014  5:21 PM  Line Care Connections checked and tightened 07/23/2014  5:21 PM  Line Adjustment (NICU/IV Team Only) No 07/23/2014  5:21 PM  Dressing Intervention New dressing 07/23/2014  5:21 PM  Dressing Change Due 07/30/14 07/23/2014  5:21 PM       Rolena Infante 07/23/2014, 5:22 PM

## 2014-07-23 NOTE — Progress Notes (Signed)
  Subjective: No complaints  Objective: Vital signs in last 24 hours: Temp:  [98.9 F (37.2 C)-100 F (37.8 C)] 99.3 F (37.4 C) (07/16 0442) Pulse Rate:  [82-86] 82 (07/16 0442) Resp:  [18-20] 18 (07/16 0442) BP: (114-148)/(62-85) 114/85 mmHg (07/16 0442) SpO2:  [96 %-98 %] 96 % (07/16 0442)    Intake/Output from previous day: 07/15 0701 - 07/16 0700 In: 320 [P.O.:120; IV Piggyback:200] Out: 1590 [Urine:1590] Intake/Output this shift:    Resp: clear to auscultation bilaterally Cardio: regular rate and rhythm GI: soft, nontender. drain output bilious  Lab Results:   Recent Labs  07/21/14 0459 07/22/14 0500  WBC 20.3* 19.2*  HGB 11.8* 11.1*  HCT 37.4 35.3*  PLT 260 226   BMET  Recent Labs  07/21/14 0459 07/22/14 0500  NA 134* 141  K 3.6 3.3*  CL 99* 108  CO2 26 27  GLUCOSE 242* 160*  BUN 16 13  CREATININE 1.16* 1.05*  CALCIUM 8.0* 7.8*   PT/INR No results for input(s): LABPROT, INR in the last 72 hours. ABG No results for input(s): PHART, HCO3 in the last 72 hours.  Invalid input(s): PCO2, PO2  Studies/Results: No results found.  Anti-infectives: Anti-infectives    Start     Dose/Rate Route Frequency Ordered Stop   07/23/14 0000  vancomycin (VANCOCIN) IVPB 1000 mg/200 mL premix     1,000 mg 200 mL/hr over 60 Minutes Intravenous Every 12 hours 07/22/14 1047     07/22/14 1200  vancomycin (VANCOCIN) 2,000 mg in sodium chloride 0.9 % 500 mL IVPB     2,000 mg 250 mL/hr over 120 Minutes Intravenous  Once 07/22/14 1046 07/22/14 1418   07/21/14 1400  imipenem-cilastatin (PRIMAXIN) 500 mg in sodium chloride 0.9 % 100 mL IVPB     500 mg 200 mL/hr over 30 Minutes Intravenous 3 times per day 07/21/14 0824     07/21/14 1000  ciprofloxacin (CIPRO) IVPB 400 mg  Status:  Discontinued     400 mg 200 mL/hr over 60 Minutes Intravenous Every 12 hours 07/21/14 0622 07/21/14 0803   07/21/14 0830  imipenem-cilastatin (PRIMAXIN) 500 mg in sodium chloride 0.9 %  100 mL IVPB  Status:  Discontinued     500 mg 200 mL/hr over 30 Minutes Intravenous 3 times per day 07/21/14 0822 07/21/14 0824   07/21/14 0800  metroNIDAZOLE (FLAGYL) IVPB 500 mg  Status:  Discontinued     500 mg 100 mL/hr over 60 Minutes Intravenous Every 8 hours 07/21/14 0622 07/21/14 1018   07/20/14 2200  ciprofloxacin (CIPRO) IVPB 400 mg     400 mg 200 mL/hr over 60 Minutes Intravenous  Once 07/20/14 2154 07/21/14 0059   07/20/14 2200  metroNIDAZOLE (FLAGYL) IVPB 500 mg     500 mg 100 mL/hr over 60 Minutes Intravenous  Once 07/20/14 2154 07/21/14 0414      Assessment/Plan: s/p * No surgery found * Advance diet. Start clears today Continue drain Continue primaxin/vanc Consider cholecystectomy during this admission in order to restart chemo  LOS: 2 days    TOTH III,Spirit Wernli S 07/23/2014

## 2014-07-23 NOTE — Progress Notes (Signed)
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 Burr Medico), diabetes mellitus who was hospitalized one month back for sepsis due to acute cholecystitis and had percutaneous cholecystostomy tube placed presented to the ED with sudden onset of right upper quadrant abdominal pain after she had a drain study done on 7/11. This is associated with nausea and was febrile in the ED with mildly elevated LFTs. She also has significant leukocytosis and elevated lactic acid meeting criteria for sepsis. A CT scan of the abdomen and pelvis showing progression of hepatic and metastatic disease.  Assessment/Plan: Sepsis Possibly related to ascending cholangitis versus progressive liver metastases. Placed on Cipro and Flagyl on admission which has been transitioned to Primaxin for broader coverage. Lactic acid resolved with IV fluid bolus. Cholecystostomy drain culture growing coag-negative staph. Added empiric vancomycin Follow blood cultures.  Cholangiogram done by IR on 7/11 showed patent drain. Reevaluated by  surgery and recommend  cholecystectomy this admission.Marland Kitchen Possibly on 7/18) Continue maintenance IV hydration and supportive care with Tylenol and antiemetics. No further abdominal pain symptoms, nuasea or vomiting. -Started back on clears. -Order PICC line for poor IV access.   Type 2 diabetes mellitus Monitor on sliding insulin .hold lantus as pt NPO.  Metastatic carcinoid tumor Follows with Dr. Burr Medico. CT on admission shows progressive liver and nodal metastases. On octreotide as outpatient. Seen by Dr Burr Medico and liver targeted therapy by IR once acute illness resolves.  Acute kidney injury Secondary to sepsis and dehydration. Hold Lasix and Monitor with IV fluids   Hyperkalemia Held  spironolactone.   Diet: Clear  liquid DVT prophylaxis: Subcutaneous heparin  code Status: Full code Family Communication: None at bedside Disposition Plan: Continue inpatient monitoring for now.   Consultants:  [facility]   Dr Burr Medico  Procedures:  CT abd and pelvis  Antibiotics:  IV cipro and flagyl x 1 dose   IV primaxin 7/14--  Iv vancomycin 7/15--  HPI/Subjective:  seen and examined. No further abdominal pain nausea vomiting.  Objective: Filed Vitals:   07/23/14 0442  BP: 114/85  Pulse: 82  Temp: 99.3 F (37.4 C)  Resp: 18    Intake/Output Summary (Last 24 hours) at 07/23/14 1106 Last data filed at 07/23/14 1054  Gross per 24 hour  Intake      3 ml  Output   1440 ml  Net  -1437 ml   Filed Weights   07/22/14 0030  Weight: 135.4 kg (298 lb 8.1 oz)    Exam:   General:   Elderly obese female not in distress  HEENT: , moist oral mucosa, supple neck  Chest: Clear to auscultation bilaterally  CVS: Normal S1 and S2, no murmurs rub or gallop  GI: Soft, nontender,  nondistended, percutaneous biliary drain in place, functioning  Musculoskeletal: Warm, no edema   CNS: Alert and oriented  Data Reviewed: Basic Metabolic Panel:  Recent Labs Lab 07/20/14 2103 07/21/14 0459 07/22/14 0500  NA 132* 134* 141  K 5.7* 3.6 3.3*  CL 96* 99* 108  CO2 25 26 27   GLUCOSE 242* 242* 160*  BUN 15 16 13   CREATININE 1.34* 1.16* 1.05*  CALCIUM 8.6* 8.0* 7.8*   Liver Function Tests:  Recent Labs Lab 07/20/14 2103 07/21/14 0459 07/22/14 0500  AST 110* 54* 28  ALT 25 21 15   ALKPHOS 398* 330* 288*  BILITOT 2.7* 2.1* 1.7*  PROT 8.6* 7.6 7.1  ALBUMIN 3.2* 2.7* 2.4*    Recent Labs Lab 07/20/14 2103  LIPASE <10*    Recent Labs Lab 07/22/14 0900  AMMONIA 30   CBC:  Recent Labs Lab 07/20/14 2103 07/21/14 0459 07/22/14 0500 07/23/14 0845  WBC 22.9* 20.3* 19.2* 13.8*  NEUTROABS  --  15.4*  --   --   HGB 12.9 11.8* 11.1* 10.3*  HCT 40.6 37.4 35.3* 34.0*  MCV 90.8 90.1 89.4 92.1   PLT 271 260 226 262   Cardiac Enzymes: No results for input(s): CKTOTAL, CKMB, CKMBINDEX, TROPONINI in the last 168 hours. BNP (last 3 results) No results for input(s): BNP in the last 8760 hours.  ProBNP (last 3 results) No results for input(s): PROBNP in the last 8760 hours.  CBG:  Recent Labs Lab 07/22/14 1618 07/22/14 2020 07/23/14 0023 07/23/14 0429 07/23/14 0802  GLUCAP 121* 115* 123* 123* 85    Recent Results (from the past 240 hour(s))  Culture, blood (routine x 2)     Status: None (Preliminary result)   Collection Time: 07/21/14  4:59 AM  Result Value Ref Range Status   Specimen Description BLOOD RIGHT HAND  Final   Special Requests BOTTLES DRAWN AEROBIC ONLY 2.5ML  Final   Culture   Final    NO GROWTH 1 DAY Performed at [facility]    Report Status PENDING  Incomplete  Culture, blood (routine x 2)     Status: None (Preliminary result)   Collection Time: 07/21/14  4:59 AM  Result Value Ref Range Status   Specimen Description BLOOD RIGHT ANTECUBITAL  Final   Special Requests BOTTLES DRAWN AEROBIC AND ANAEROBIC 5.5ML  Final   Culture   Final    NO GROWTH 1 DAY Performed at [facility]    Report Status PENDING  Incomplete  Body fluid culture     Status: None (Preliminary result)   Collection Time: 07/21/14 10:42 AM  Result Value Ref Range Status   Specimen Description COMMON BILE DUCT  Final   Special Requests Immunocompromised  Final   Gram Stain   Final    NO WBC SEEN MODERATE GRAM POSITIVE COCCI IN CLUSTERS    Culture   Final    ABUNDANT STAPHYLOCOCCUS SPECIES (COAGULASE NEGATIVE) CALL MICROBIOLOGY LAB IF SENSITIVITIES ARE REQUIRED. Performed at [facility]    Report Status PENDING  Incomplete     Studies: No results found.  Scheduled Meds: . gabapentin  400 mg Oral TID  . heparin subcutaneous  5,000 Units Subcutaneous 3 times per day  . imipenem-cilastatin  500 mg Intravenous 3 times per day  . insulin aspart  0-9  Units Subcutaneous Q4H  . insulin glargine  5 Units Subcutaneous QHS  . saccharomyces boulardii  250 mg Oral BID  . sodium chloride  3 mL Intravenous Q12H  . vancomycin  1,000 mg Intravenous Q12H   Continuous Infusions:    Time spent: 25 minutes    Moncerrat Burnstein, [place]  [facility] Pager [phone] If 7PM-7AM, please contact night-coverage at [email or web address], password [facility] 07/23/2014, 11:06 AM  LOS: 2 days

## 2014-07-24 LAB — URINALYSIS, ROUTINE W REFLEX MICROSCOPIC
Bilirubin Urine: NEGATIVE
GLUCOSE, UA: NEGATIVE mg/dL
HGB URINE DIPSTICK: NEGATIVE
Ketones, ur: NEGATIVE mg/dL
Leukocytes, UA: NEGATIVE
Nitrite: NEGATIVE
PH: 6 (ref 5.0–8.0)
PROTEIN: NEGATIVE mg/dL
Specific Gravity, Urine: 1.011 (ref 1.005–1.030)
Urobilinogen, UA: 1 mg/dL (ref 0.0–1.0)

## 2014-07-24 LAB — GLUCOSE, CAPILLARY
GLUCOSE-CAPILLARY: 144 mg/dL — AB (ref 65–99)
GLUCOSE-CAPILLARY: 126 mg/dL — AB (ref 65–99)
Glucose-Capillary: 108 mg/dL — ABNORMAL HIGH (ref 65–99)
Glucose-Capillary: 208 mg/dL — ABNORMAL HIGH (ref 65–99)
Glucose-Capillary: 157 mg/dL — ABNORMAL HIGH (ref 65–99)

## 2014-07-24 LAB — VANCOMYCIN, TROUGH: VANCOMYCIN TR: 20 ug/mL (ref 10.0–20.0)

## 2014-07-24 MED ORDER — FUROSEMIDE 40 MG PO TABS
40.0000 mg | ORAL_TABLET | Freq: Every day | ORAL | Status: DC
  Administered 2014-07-24 – 2014-08-01 (×5): 40 mg via ORAL
  Filled 2014-07-24 (×10): qty 1

## 2014-07-24 MED ORDER — VANCOMYCIN HCL IN DEXTROSE 750-5 MG/150ML-% IV SOLN
750.0000 mg | Freq: Two times a day (BID) | INTRAVENOUS | Status: DC
  Administered 2014-07-25 – 2014-07-26 (×4): 750 mg via INTRAVENOUS
  Filled 2014-07-24 (×4): qty 150

## 2014-07-24 MED ORDER — INSULIN ASPART 100 UNIT/ML ~~LOC~~ SOLN
0.0000 [IU] | Freq: Three times a day (TID) | SUBCUTANEOUS | Status: DC
  Administered 2014-07-24: 2 [IU] via SUBCUTANEOUS
  Administered 2014-07-25: 3 [IU] via SUBCUTANEOUS
  Administered 2014-07-25: 2 [IU] via SUBCUTANEOUS
  Administered 2014-07-25: 3 [IU] via SUBCUTANEOUS
  Administered 2014-07-27: 2 [IU] via SUBCUTANEOUS
  Administered 2014-07-27: 1 [IU] via SUBCUTANEOUS
  Administered 2014-07-28: 3 [IU] via SUBCUTANEOUS
  Administered 2014-07-28: 1 [IU] via SUBCUTANEOUS
  Administered 2014-07-28: 2 [IU] via SUBCUTANEOUS
  Administered 2014-07-29: 3 [IU] via SUBCUTANEOUS
  Administered 2014-07-29: 2 [IU] via SUBCUTANEOUS

## 2014-07-24 MED ORDER — ENSURE ENLIVE PO LIQD
237.0000 mL | Freq: Two times a day (BID) | ORAL | Status: DC
  Administered 2014-07-25 – 2014-07-29 (×7): 237 mL via ORAL

## 2014-07-24 NOTE — Progress Notes (Signed)
  Subjective: No complaints  Objective: Vital signs in last 24 hours: Temp:  [98.7 F (37.1 C)-99.3 F (37.4 C)] 98.7 F (37.1 C) (07/17 0637) Pulse Rate:  [59-84] 69 (07/17 0637) Resp:  [14-18] 14 (07/17 0637) BP: (125-133)/(62-78) 133/73 mmHg (07/17 0637) SpO2:  [96 %-98 %] 97 % (07/17 0637) Last BM Date: 07/23/14  Intake/Output from previous day: 07/16 0701 - 07/17 0700 In: 303 [I.V.:3; IV Piggyback:300] Out: 1045 [Urine:1000; Drains:45] Intake/Output this shift:    Resp: clear to auscultation bilaterally Cardio: regular rate and rhythm GI: soft, nontender. drain output bilious  Lab Results:   Recent Labs  07/22/14 0500 07/23/14 0845  WBC 19.2* 13.8*  HGB 11.1* 10.3*  HCT 35.3* 34.0*  PLT 226 262   BMET  Recent Labs  07/22/14 0500  NA 141  K 3.3*  CL 108  CO2 27  GLUCOSE 160*  BUN 13  CREATININE 1.05*  CALCIUM 7.8*   PT/INR No results for input(s): LABPROT, INR in the last 72 hours. ABG No results for input(s): PHART, HCO3 in the last 72 hours.  Invalid input(s): PCO2, PO2  Studies/Results: No results found.  Anti-infectives: Anti-infectives    Start     Dose/Rate Route Frequency Ordered Stop   07/23/14 0000  vancomycin (VANCOCIN) IVPB 1000 mg/200 mL premix     1,000 mg 200 mL/hr over 60 Minutes Intravenous Every 12 hours 07/22/14 1047     07/22/14 1200  vancomycin (VANCOCIN) 2,000 mg in sodium chloride 0.9 % 500 mL IVPB     2,000 mg 250 mL/hr over 120 Minutes Intravenous  Once 07/22/14 1046 07/22/14 1418   07/21/14 1400  imipenem-cilastatin (PRIMAXIN) 500 mg in sodium chloride 0.9 % 100 mL IVPB     500 mg 200 mL/hr over 30 Minutes Intravenous 3 times per day 07/21/14 0824     07/21/14 1000  ciprofloxacin (CIPRO) IVPB 400 mg  Status:  Discontinued     400 mg 200 mL/hr over 60 Minutes Intravenous Every 12 hours 07/21/14 0622 07/21/14 0803   07/21/14 0830  imipenem-cilastatin (PRIMAXIN) 500 mg in sodium chloride 0.9 % 100 mL IVPB  Status:   Discontinued     500 mg 200 mL/hr over 30 Minutes Intravenous 3 times per day 07/21/14 0822 07/21/14 0824   07/21/14 0800  metroNIDAZOLE (FLAGYL) IVPB 500 mg  Status:  Discontinued     500 mg 100 mL/hr over 60 Minutes Intravenous Every 8 hours 07/21/14 0622 07/21/14 1018   07/20/14 2200  ciprofloxacin (CIPRO) IVPB 400 mg     400 mg 200 mL/hr over 60 Minutes Intravenous  Once 07/20/14 2154 07/21/14 0059   07/20/14 2200  metroNIDAZOLE (FLAGYL) IVPB 500 mg     500 mg 100 mL/hr over 60 Minutes Intravenous  Once 07/20/14 2154 07/21/14 0414      Assessment/Plan: s/p * No surgery found * Advance diet  Perc chole drain has been in about 6 weeks so will talk to primary team about timing of surgery Metastatic carcinoid per oncology  LOS: 3 days    TOTH III,Devansh Riese S 07/24/2014

## 2014-07-24 NOTE — Progress Notes (Signed)
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 Burr Medico), diabetes mellitus who was hospitalized one month back for sepsis due to acute cholecystitis and had percutaneous cholecystostomy tube placed presented to the ED with sudden onset of right upper quadrant abdominal pain after she had a drain study done on 7/11. This is associated with nausea and was febrile in the ED with mildly elevated LFTs. She also has significant leukocytosis and elevated lactic acid meeting criteria for sepsis. A CT scan of the abdomen and pelvis showing progression of hepatic and metastatic disease.  Assessment/Plan: Sepsis Currently resolved. Possibly related to ascending cholangitis versus progressive liver metastases. Placed on Cipro and Flagyl on admission which has been transitioned to Primaxin for broader coverage. Cholecystostomy drain culture growing coag-negative staph. Added empiric vancomycin Blood cultures negative.  Cholangiogram done by IR on 7/11 showed patent drain. -Surgery following with plan on lap cholecystectomy this hospitalization. -.Advanced to regular.  Type 2 diabetes mellitus Monitor on sliding insulin .continue home dose Lantus.  Metastatic carcinoid tumor Follows with Dr. Burr Medico. CT on admission shows progressive liver and nodal metastases. On octreotide as outpatient. Seen by Dr Burr Medico and liver targeted therapy by IR once acute illness resolves.  Acute kidney injury Secondary to sepsis. Improved with IV hydration. Resume Lasix.  Hyperkalemia Held  spironolactone.   Diet: Diabetic DVT prophylaxis: Subcutaneous heparin  code Status: Full code Family Communication: None at bedside Disposition Plan: Continue inpatient monitoring for now. Plan for laparoscopic cholecystectomy this  week.   Consultants:  [facility]   Dr Burr Medico  Procedures:  CT abd and pelvis  Antibiotics:  IV cipro and flagyl x 1 dose   IV primaxin 7/14--  Iv vancomycin 7/15--  HPI/Subjective:  seen and examined. Denies abdominal pain, nausea or vomiting.  Objective: Filed Vitals:   07/24/14 0637  BP: 133/73  Pulse: 69  Temp: 98.7 F (37.1 C)  Resp: 14    Intake/Output Summary (Last 24 hours) at 07/24/14 1316 Last data filed at 07/24/14 1141  Gross per 24 hour  Intake    900 ml  Output   1270 ml  Net   -370 ml   Filed Weights   07/22/14 0030  Weight: 135.4 kg (298 lb 8.1 oz)    Exam:   General:  not in distress  HEENT: , moist oral mucosa, supple neck  Chest: Clear to auscultation bilaterally  CVS: Normal S1 and S2, no murmurs rub or gallop  GI: Soft, nontender,  nondistended, percutaneous biliary drain in place, functioning well  Musculoskeletal: Warm, trace edema    Data Reviewed: Basic Metabolic Panel:  Recent Labs Lab 07/20/14 2103 07/21/14 0459 07/22/14 0500  NA 132* 134* 141  K 5.7* 3.6 3.3*  CL 96* 99* 108  CO2 25 26 27   GLUCOSE 242* 242* 160*  BUN 15 16 13   CREATININE 1.34* 1.16* 1.05*  CALCIUM 8.6* 8.0* 7.8*   Liver Function Tests:  Recent Labs Lab 07/20/14 2103 07/21/14 0459 07/22/14 0500  AST 110* 54* 28  ALT 25 21 15   ALKPHOS 398* 330* 288*  BILITOT 2.7* 2.1* 1.7*  PROT 8.6* 7.6 7.1  ALBUMIN 3.2* 2.7* 2.4*    Recent Labs Lab 07/20/14 2103  LIPASE <10*    Recent Labs Lab 07/22/14 0900  AMMONIA 30   CBC:  Recent Labs Lab 07/20/14 2103 07/21/14 0459 07/22/14 0500  07/23/14 0845  WBC 22.9* 20.3* 19.2* 13.8*  NEUTROABS  --  15.4*  --   --   HGB 12.9 11.8* 11.1* 10.3*  HCT 40.6 37.4 35.3* 34.0*  MCV 90.8 90.1 89.4 92.1  PLT 271 260 226 262   Cardiac Enzymes: No results for input(s): CKTOTAL, CKMB, CKMBINDEX, TROPONINI in the last 168 hours. BNP (last 3 results) No results for input(s): BNP in the last 8760  hours.  ProBNP (last 3 results) No results for input(s): PROBNP in the last 8760 hours.  CBG:  Recent Labs Lab 07/23/14 2212 07/23/14 2352 07/24/14 0403 07/24/14 0752 07/24/14 1150  GLUCAP 127* 135* 144* 108* 126*    Recent Results (from the past 240 hour(s))  Culture, blood (routine x 2)     Status: None (Preliminary result)   Collection Time: 07/21/14  4:59 AM  Result Value Ref Range Status   Specimen Description BLOOD RIGHT HAND  Final   Special Requests BOTTLES DRAWN AEROBIC ONLY 2.5ML  Final   Culture   Final    NO GROWTH 3 DAYS Performed at [facility]    Report Status PENDING  Incomplete  Culture, blood (routine x 2)     Status: None (Preliminary result)   Collection Time: 07/21/14  4:59 AM  Result Value Ref Range Status   Specimen Description BLOOD RIGHT ANTECUBITAL  Final   Special Requests BOTTLES DRAWN AEROBIC AND ANAEROBIC 5.5ML  Final   Culture   Final    NO GROWTH 3 DAYS Performed at [facility]    Report Status PENDING  Incomplete  Body fluid culture     Status: None (Preliminary result)   Collection Time: 07/21/14 10:42 AM  Result Value Ref Range Status   Specimen Description COMMON BILE DUCT  Final   Special Requests Immunocompromised  Final   Gram Stain   Final    NO WBC SEEN MODERATE GRAM POSITIVE COCCI IN CLUSTERS    Culture   Final    ABUNDANT STAPHYLOCOCCUS SPECIES (COAGULASE NEGATIVE) CALL MICROBIOLOGY LAB IF SENSITIVITIES ARE REQUIRED. Performed at [facility]    Report Status PENDING  Incomplete     Studies: No results found.  Scheduled Meds: . gabapentin  400 mg Oral TID  . heparin subcutaneous  5,000 Units Subcutaneous 3 times per day  . imipenem-cilastatin  500 mg Intravenous 3 times per day  . insulin aspart  0-9 Units Subcutaneous Q4H  . insulin glargine  10 Units Subcutaneous BID  . saccharomyces boulardii  250 mg Oral BID  . sodium chloride  3 mL Intravenous Q12H  . [START ON 07/25/2014]  vancomycin  750 mg Intravenous Q12H   Continuous Infusions:    Time spent: 25 minutes    [name], [place]  [facility] Pager [phone] If 7PM-7AM, please contact night-coverage at [email or web address], password [email or web address] 07/24/2014, 1:16 PM  LOS: 3 days

## 2014-07-24 NOTE — Progress Notes (Signed)
ANTIBIOTIC CONSULT NOTE - FOLLOW UP  Pharmacy Consult for Primaxin, vancomycin Indication: intra-abdominal infection  Allergies  Allergen Reactions  . Penicillins Itching and Swelling    Patient Measurements: Height: 5\' 10"  (177.8 cm) Weight: 298 lb 8.1 oz (135.4 kg) IBW/kg (Calculated) : 68.5   Vital Signs: Temp: 98.7 F (37.1 C) (07/17 0637) Temp Source: Oral (07/17 2297) BP: 133/73 mmHg (07/17 0637) Pulse Rate: 69 (07/17 0637) Intake/Output from previous day: 07/16 0701 - 07/17 0700 In: 303 [I.V.:3; IV Piggyback:300] Out: 1045 [Urine:1000; Drains:45] Intake/Output from this shift: Total I/O In: 600 [P.O.:580; I.V.:20] Out: 400 [Urine:400]  Labs:  Recent Labs  07/22/14 0500 07/23/14 0845  WBC 19.2* 13.8*  HGB 11.1* 10.3*  PLT 226 262  CREATININE 1.05*  --    Estimated Creatinine Clearance: 80.4 mL/min (by C-G formula based on Cr of 1.05).  Recent Labs  07/24/14 1120  [place] [address]     Microbiology: Recent Results (from the past 720 hour(s))  Culture, blood (routine x 2)     Status: None (Preliminary result)   Collection Time: 07/21/14  4:59 AM  Result Value Ref Range Status   Specimen Description BLOOD RIGHT HAND  Final   Special Requests BOTTLES DRAWN AEROBIC ONLY 2.5ML  Final   Culture   Final    NO GROWTH 2 DAYS Performed at [facility]    Report Status PENDING  Incomplete  Culture, blood (routine x 2)     Status: None (Preliminary result)   Collection Time: 07/21/14  4:59 AM  Result Value Ref Range Status   Specimen Description BLOOD RIGHT ANTECUBITAL  Final   Special Requests BOTTLES DRAWN AEROBIC AND ANAEROBIC 5.5ML  Final   Culture   Final    NO GROWTH 2 DAYS Performed at [facility]    Report Status PENDING  Incomplete  Body fluid culture     Status: None (Preliminary result)   Collection Time: 07/21/14 10:42 AM  Result Value Ref Range Status   Specimen Description COMMON BILE DUCT  Final   Special Requests  Immunocompromised  Final   Gram Stain   Final    NO WBC SEEN MODERATE GRAM POSITIVE COCCI IN CLUSTERS    Culture   Final    ABUNDANT STAPHYLOCOCCUS SPECIES (COAGULASE NEGATIVE) CALL MICROBIOLOGY LAB IF SENSITIVITIES ARE REQUIRED. Performed at [facility]    Report Status PENDING  Incomplete      Assessment: 34 yoF s/p placement of cholecystostomy tube in early June 2016 now with worsening RUQ abdominal pain and low grade fever. Started on Cipro/Flagyl per Rx for intra-abdominal infection, now to switch to Primaxin.  7/14 >> Cipro >> 7/14 7/14 >> Flagyl >> 7/14 7/14 >> Primaxin >>  7/15 >> vancomycin  Temp: AF WBC: elevated but trending down Renal: SCr steadily improving; CrCl 60 N  7/14 blood: IP 7/14 fluid from common bile duct: IP; gram stain showed moderate GPC in clusters  7/17: Vanc trough= 20 (upper end of therapeutic)   Goal of Therapy:  Eradication of infection Appropriate antibiotic dosing for indication and renal function Vancomycin trough 15-20  Plan:  Day 2 antibiotics  Continue Primaxin 500 mg IV q8hr  Decrease vancomycin to 750mg  IV q12h  Follow-up on culture results  Follow renal function and de-escalation of antibiotics  Dolly Rias RPh 07/24/2014, 12:39 PM Pager [phone]

## 2014-07-25 ENCOUNTER — Inpatient Hospital Stay (HOSPITAL_COMMUNITY): Payer: Medicare Other

## 2014-07-25 DIAGNOSIS — Z6841 Body Mass Index (BMI) 40.0 and over, adult: Secondary | ICD-10-CM

## 2014-07-25 DIAGNOSIS — E876 Hypokalemia: Secondary | ICD-10-CM

## 2014-07-25 DIAGNOSIS — E43 Unspecified severe protein-calorie malnutrition: Secondary | ICD-10-CM

## 2014-07-25 DIAGNOSIS — K819 Cholecystitis, unspecified: Secondary | ICD-10-CM

## 2014-07-25 DIAGNOSIS — B957 Other staphylococcus as the cause of diseases classified elsewhere: Secondary | ICD-10-CM

## 2014-07-25 LAB — GLUCOSE, CAPILLARY
GLUCOSE-CAPILLARY: 109 mg/dL — AB (ref 65–99)
Glucose-Capillary: 204 mg/dL — ABNORMAL HIGH (ref 65–99)
Glucose-Capillary: 178 mg/dL — ABNORMAL HIGH (ref 65–99)
Glucose-Capillary: 215 mg/dL — ABNORMAL HIGH (ref 65–99)

## 2014-07-25 LAB — BODY FLUID CULTURE: Gram Stain: NONE SEEN

## 2014-07-25 LAB — COMPREHENSIVE METABOLIC PANEL
ALBUMIN: 2.3 g/dL — AB (ref 3.5–5.0)
ALK PHOS: 556 U/L — AB (ref 38–126)
ALT: 13 U/L — ABNORMAL LOW (ref 14–54)
AST: 22 U/L (ref 15–41)
Anion gap: 7 (ref 5–15)
BUN: <5 mg/dL — ABNORMAL LOW (ref 6–20)
CO2: 27 mmol/L (ref 22–32)
Calcium: 8 mg/dL — ABNORMAL LOW (ref 8.9–10.3)
Chloride: 109 mmol/L (ref 101–111)
Creatinine, Ser: 0.93 mg/dL (ref 0.44–1.00)
GFR calc Af Amer: >60 mL/min (ref >60)
Glucose, Bld: 144 mg/dL — ABNORMAL HIGH (ref 65–99)
Potassium: 2.7 mmol/L — CL (ref 3.5–5.1)
SODIUM: 143 mmol/L (ref 135–145)
Total Bilirubin: 1.1 mg/dL (ref 0.3–1.2)
Total Protein: 7.4 g/dL (ref 6.5–8.1)

## 2014-07-25 LAB — APTT: APTT: 42 s — AB (ref 24–37)

## 2014-07-25 LAB — TYPE AND SCREEN
ABO/RH(D): AB POS
Antibody Screen: NEGATIVE

## 2014-07-25 LAB — ABO/RH: ABO/RH(D): AB POS

## 2014-07-25 LAB — CBC
HCT: 35.9 % — ABNORMAL LOW (ref 36.0–46.0)
HEMOGLOBIN: 10.8 g/dL — AB (ref 12.0–15.0)
MCH: 27.9 pg (ref 26.0–34.0)
MCHC: 30.1 g/dL (ref 30.0–36.0)
MCV: 92.8 fL (ref 78.0–100.0)
Platelets: 291 10*3/uL (ref 150–400)
RBC: 3.87 MIL/uL (ref 3.87–5.11)
RDW: 14.2 % (ref 11.5–15.5)
WBC: 7.8 10*3/uL (ref 4.0–10.5)

## 2014-07-25 LAB — PROTIME-INR
INR: 1.23 (ref 0.00–1.49)
Prothrombin Time: 15.7 seconds — ABNORMAL HIGH (ref 11.6–15.2)

## 2014-07-25 LAB — SURGICAL PCR SCREEN
MRSA, PCR: NEGATIVE
Staphylococcus aureus: NEGATIVE

## 2014-07-25 LAB — MAGNESIUM: Magnesium: 1.8 mg/dL (ref 1.7–2.4)

## 2014-07-25 LAB — PROCALCITONIN: Procalcitonin: 0.99 ng/mL

## 2014-07-25 MED ORDER — SODIUM CHLORIDE 0.9 % IV SOLN
500.0000 mg | Freq: Four times a day (QID) | INTRAVENOUS | Status: DC
  Administered 2014-07-25 – 2014-08-01 (×27): 500 mg via INTRAVENOUS
  Filled 2014-07-25 (×29): qty 500

## 2014-07-25 MED ORDER — SPIRONOLACTONE 25 MG PO TABS
25.0000 mg | ORAL_TABLET | Freq: Every day | ORAL | Status: DC
  Administered 2014-07-25 – 2014-08-01 (×7): 25 mg via ORAL
  Filled 2014-07-25 (×8): qty 1

## 2014-07-25 MED ORDER — POTASSIUM CHLORIDE CRYS ER 20 MEQ PO TBCR
40.0000 meq | EXTENDED_RELEASE_TABLET | Freq: Four times a day (QID) | ORAL | Status: AC
  Administered 2014-07-25 (×2): 40 meq via ORAL
  Filled 2014-07-25 (×3): qty 2

## 2014-07-25 NOTE — Progress Notes (Signed)
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 Burr Medico), diabetes mellitus who was hospitalized one month back for sepsis due to acute cholecystitis and had percutaneous cholecystostomy tube placed presented to the ED with sudden onset of right upper quadrant abdominal pain after she had a drain study done on 7/11. This is associated with nausea and was febrile in the ED with mildly elevated LFTs. She also has significant leukocytosis and elevated lactic acid meeting criteria for sepsis. A CT scan of the abdomen and pelvis showing progression of hepatic and metastatic disease.  Assessment/Plan: Sepsis Currently resolved. Possibly related to ascending cholangitis versus progressive liver metastases. Placed on Cipro and Flagyl on admission which has been transitioned to Primaxin for broader coverage. Cholecystostomy drain culture growing coag-negative staph. on empiric vancomycin. Consulted ID for further recommendations. Blood cultures negative.  Cholangiogram done by IR on 7/11 showed patent drain. -Surgery following with plan on lap cholecystectomy this hospitalization. -.Advanced to regular.  Perioperative clearance: -EKG unremarkable this admission. Not on beta blocker as outpatient. Does not need perioperative beta-blockade. 2-D echo from 08/2013 with normal EF and no wall motion abnormality. Does not need any perioperative cardiac or pulmonary  workup.   Type 2 diabetes mellitus Monitor on sliding insulin .continue home dose Lantus.  Metastatic carcinoid tumor Follows with Dr. Burr Medico. CT on admission shows progressive liver and nodal metastases. On octreotide as outpatient. Seen by Dr Burr Medico and liver targeted therapy by IR once acute illness resolves.  Acute kidney injury Secondary to sepsis. Improved with IV  hydration. Continue Lasix.  Hypokalemia Replenish with KCl. Resume Aldactone.    Diet: Diabetic DVT prophylaxis: Subcutaneous heparin  code Status: Full code Family Communication: None at bedside Disposition Plan:  Plan for laparoscopic cholecystectomy per surgery  Consultants:  CCS   Dr Burr Medico  Procedures:  CT abd and pelvis  Antibiotics:  IV cipro and flagyl x 1 dose   IV primaxin 7/14--  Iv vancomycin 7/15--  HPI/Subjective:  seen and examined. Denies abdominal pain or any other symptoms.  Objective: Filed Vitals:   07/25/14 0544  BP: 132/66  Pulse: 65  Temp: 98.2 F (36.8 C)  Resp: 18    Intake/Output Summary (Last 24 hours) at 07/25/14 1401 Last data filed at 07/25/14 1000  Gross per 24 hour  Intake    930 ml  Output   3960 ml  Net  -3030 ml   Filed Weights   07/22/14 0030 07/25/14 0544  Weight: 135.4 kg (298 lb 8.1 oz) 140.2 kg (309 lb 1.4 oz)    Exam:   General:  not in distress  HEENT: , moist oral mucosa,   Chest: Clear to auscultation bilaterally  CVS: Normal S1 and S2, no murmurs rub or gallop  GI: Soft, nontender,  nondistended, percutaneous biliary drain in place, functioning well  Musculoskeletal: Warm, no edema    Data Reviewed: Basic Metabolic Panel:  Recent Labs Lab 07/20/14 2103 07/21/14 0459 07/22/14 0500 07/25/14 0844 07/25/14 0850  NA 132* 134* 141 143  --   K 5.7* 3.6 3.3* 2.7*  --   CL 96* 99* 108 109  --   CO2 25 26 27 27   --   GLUCOSE 242* 242* 160* 144*  --   BUN 15 16 13  <5*  --   CREATININE 1.34* 1.16* 1.05* 0.93  --   CALCIUM  8.6* 8.0* 7.8* 8.0*  --   MG  --   --   --   --  1.8   Liver Function Tests:  Recent Labs Lab 07/20/14 2103 07/21/14 0459 07/22/14 0500 07/25/14 0844  AST 110* 54* 28 22  ALT 25 21 15  13*  ALKPHOS 398* 330* 288* 556*  BILITOT 2.7* 2.1* 1.7* 1.1  PROT 8.6* 7.6 7.1 7.4  ALBUMIN 3.2* 2.7* 2.4* 2.3*    Recent Labs Lab 07/20/14 2103  LIPASE <10*    Recent  Labs Lab 07/22/14 0900  AMMONIA 30   CBC:  Recent Labs Lab 07/20/14 2103 07/21/14 0459 07/22/14 0500 07/23/14 0845 07/25/14 0844  WBC 22.9* 20.3* 19.2* 13.8* 7.8  NEUTROABS  --  15.4*  --   --   --   HGB 12.9 11.8* 11.1* 10.3* 10.8*  HCT 40.6 37.4 35.3* 34.0* 35.9*  MCV 90.8 90.1 89.4 92.1 92.8  PLT 271 260 226 262 291   Cardiac Enzymes: No results for input(s): CKTOTAL, CKMB, CKMBINDEX, TROPONINI in the last 168 hours. BNP (last 3 results) No results for input(s): BNP in the last 8760 hours.  ProBNP (last 3 results) No results for input(s): PROBNP in the last 8760 hours.  CBG:  Recent Labs Lab 07/24/14 1150 07/24/14 1550 07/24/14 2234 07/25/14 0752 07/25/14 1155  GLUCAP 126* 208* 157* 109* 204*    Recent Results (from the past 240 hour(s))  Culture, blood (routine x 2)     Status: None (Preliminary result)   Collection Time: 07/21/14  4:59 AM  Result Value Ref Range Status   Specimen Description BLOOD RIGHT HAND  Final   Special Requests BOTTLES DRAWN AEROBIC ONLY 2.5ML  Final   Culture   Final    NO GROWTH 4 DAYS Performed at [facility]    Report Status PENDING  Incomplete  Culture, blood (routine x 2)     Status: None (Preliminary result)   Collection Time: 07/21/14  4:59 AM  Result Value Ref Range Status   Specimen Description BLOOD RIGHT ANTECUBITAL  Final   Special Requests BOTTLES DRAWN AEROBIC AND ANAEROBIC 5.5ML  Final   Culture   Final    NO GROWTH 4 DAYS Performed at [facility]    Report Status PENDING  Incomplete  Body fluid culture     Status: None   Collection Time: 07/21/14 10:42 AM  Result Value Ref Range Status   Specimen Description COMMON BILE DUCT  Final   Special Requests Immunocompromised  Final   Gram Stain   Final    NO WBC SEEN MODERATE GRAM POSITIVE COCCI IN CLUSTERS    Culture   Final    ABUNDANT STAPHYLOCOCCUS SPECIES (COAGULASE NEGATIVE) CALL MICROBIOLOGY LAB IF SENSITIVITIES ARE  REQUIRED. Performed at [facility]    Report Status 07/24/2014 FINAL  Final     Studies: Dg Chest 2 View  07/25/2014   CLINICAL DATA:  Sepsis.  EXAM: CHEST  2 VIEW  COMPARISON:  Jun 03, 2014.  FINDINGS: Stable cardiomediastinal silhouette. Hypoinflation of the lungs is noted. No pneumothorax or pleural effusion is noted. Right-sided PICC line is noted with distal tip in the expected position of the SVC. Mild central pulmonary vascular congestion is noted. Probable minimal bibasilar subsegmental atelectasis. Bony thorax is intact.  IMPRESSION: Hypoinflation of the lungs. Mild central pulmonary vascular congestion. Probable minimal bibasilar subsegmental atelectasis.   Electronically Signed   By: Marijo Conception, M.D.   On: 07/25/2014 10:40  Scheduled Meds: . feeding supplement (ENSURE ENLIVE)  237 mL Oral BID BM  . furosemide  40 mg Oral Daily  . gabapentin  400 mg Oral TID  . heparin subcutaneous  5,000 Units Subcutaneous 3 times per day  . imipenem-cilastatin  500 mg Intravenous 3 times per day  . insulin aspart  0-9 Units Subcutaneous TID WC & HS  . insulin glargine  10 Units Subcutaneous BID  . potassium chloride  40 mEq Oral Q6H  . saccharomyces boulardii  250 mg Oral BID  . sodium chloride  3 mL Intravenous Q12H  . vancomycin  750 mg Intravenous Q12H   Continuous Infusions:    Time spent: 25 minutes    Annalysia Willenbring, [place]  [facility] Pager [phone] If 7PM-7AM, please contact night-coverage at [email or web address], password [facility] 07/25/2014, 2:01 PM  LOS: 4 days

## 2014-07-25 NOTE — Consult Note (Addendum)
Yesenia Lyons for Infectious Disease  Date of Admission:  07/20/2014  Date of Consult:  07/25/2014  Reason for Consult: Intra-abdominal infection Referring Physician: Dhungel  Impression/Recommendation Cholecystitis Coag Neg Staph Carcinoid tumor with mets DM2 Morbid Obesity Protein calorie malnutrition  Would Ask lab for sensi on staph Continue her current anbx Cholecystectomy tomorrow?  Comment- Difficult to know significance of coag neg staph. Could assume this is sterile site (not skin) and is true. She will most likely only need short course of anbx post-op regardless.   Thank you so much for this interesting consult,   Bobby Rumpf (pager) [phone] [email or web address]  Yesenia Lyons is an 64 y.o. female.  HPI: 64 yo F with DM2 (since 1997), morbid obesity, metastatic carcinoid with liver mets, and prev sepsis/cholecystitis/cholecystostomy wha was d/c 6-4 and returned 7-14 with persistent pain in RUQ and diarrhea. She was afebrile but WBC was 22.9.  CT abd showed: Interval progression of the hepatic and nodal metastatic disease. There is interval increase in the size of the left adrenal mass and mesenteric mass/lymph node. Percutaneous transhepatic cholecystostomy. No calcified gallstone or biliary ductal dilatation. No evidence of bowel obstruction or inflammation. Normal appendix.  She was started on primaxin and then vanco when her drain fluid grew MRSE.   Past Medical History  Diagnosis Date  . Pneumonia   . Renal disorder     renal failure, was on HD for a period of time  . Obesity   . Hypertension   . Diabetes mellitus   . High cholesterol   . CHF (congestive heart failure)   . 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 Abdominal hysterectomy    . Knee reconstruction, medial patellar femoral ligament       Allergies  Allergen Reactions  . Penicillins Itching and Swelling     Medications:  Scheduled: . feeding supplement (ENSURE ENLIVE)  237 mL Oral BID BM  . furosemide  40 mg Oral Daily  . gabapentin  400 mg Oral TID  . heparin subcutaneous  5,000 Units Subcutaneous 3 times per day  . imipenem-cilastatin  500 mg Intravenous Q6H  . insulin aspart  0-9 Units Subcutaneous TID WC & HS  . insulin glargine  10 Units Subcutaneous BID  . potassium chloride  40 mEq Oral Q6H  . saccharomyces boulardii  250 mg Oral BID  . sodium chloride  3 mL Intravenous Q12H  . spironolactone  25 mg Oral Daily  . vancomycin  750 mg Intravenous Q12H    Abtx:  Anti-infectives    Start     Dose/Rate Route Frequency Ordered Stop   07/25/14 2000  imipenem-cilastatin (PRIMAXIN) 500 mg in sodium chloride 0.9 % 100 mL IVPB     500 mg 200 mL/hr over 30 Minutes Intravenous Every 6 hours 07/25/14 1510     07/25/14 0000  vancomycin (VANCOCIN) IVPB 750 mg/150 ml premix     750 mg 150 mL/hr over 60 Minutes Intravenous Every 12 hours 07/24/14 1242     07/23/14 0000  vancomycin (VANCOCIN) IVPB 1000 mg/200 mL premix  Status:  Discontinued     1,000 mg 200 mL/hr over 60 Minutes Intravenous Every 12 hours 07/22/14 1047 07/24/14 1242   07/22/14 1200  vancomycin (VANCOCIN) 2,000 mg in sodium chloride 0.9 % 500 mL IVPB     2,000 mg 250 mL/hr over 120 Minutes Intravenous  Once 07/22/14 1046 07/22/14  1418   07/21/14 1400  imipenem-cilastatin (PRIMAXIN) 500 mg in sodium chloride 0.9 % 100 mL IVPB  Status:  Discontinued     500 mg 200 mL/hr over 30 Minutes Intravenous 3 times per day 07/21/14 0824 07/25/14 1510   07/21/14 1000  ciprofloxacin (CIPRO) IVPB 400 mg  Status:  Discontinued     400 mg 200 mL/hr over 60 Minutes Intravenous Every 12 hours 07/21/14 0622 07/21/14 0803   07/21/14 0830  imipenem-cilastatin (PRIMAXIN) 500 mg in sodium chloride 0.9 % 100 mL IVPB  Status:  Discontinued     500 mg 200 mL/hr over 30 Minutes Intravenous 3 times per day 07/21/14 0822 07/21/14 0824   07/21/14  0800  metroNIDAZOLE (FLAGYL) IVPB 500 mg  Status:  Discontinued     500 mg 100 mL/hr over 60 Minutes Intravenous Every 8 hours 07/21/14 0622 07/21/14 1018   07/20/14 2200  ciprofloxacin (CIPRO) IVPB 400 mg     400 mg 200 mL/hr over 60 Minutes Intravenous  Once 07/20/14 2154 07/21/14 0059   07/20/14 2200  metroNIDAZOLE (FLAGYL) IVPB 500 mg     500 mg 100 mL/hr over 60 Minutes Intravenous  Once 07/20/14 2154 07/21/14 0414      Total days of antibiotics:  IV primaxin 7/14--  Iv vancomycin 7/15--          Social History:  reports that she has never smoked. She has never used smokeless tobacco. She reports that she does not drink alcohol or use illicit drugs.  History reviewed. No pertinent family history.  General ROS: polyuria, loose BM, + edema, + neuropathy, see HPI.   Blood pressure 139/63, pulse 66, temperature 98.3 F (36.8 C), temperature source Oral, resp. rate 20, height '5\' 10"'  (1.778 m), weight 140.2 kg (309 lb 1.4 oz), SpO2 97 %. General appearance: alert, cooperative, no distress and morbidly obese Eyes: negative findings: pupils equal, round, reactive to light and accomodation Throat: normal findings: oropharynx pink & moist without lesions or evidence of thrush Neck: no adenopathy, supple, symmetrical, trachea midline and thyroid not enlarged, symmetric, no tenderness/mass/nodules Lungs: clear to auscultation bilaterally Heart: regular rate and rhythm Abdomen: normal findings: bowel sounds normal and soft, non-tender, drain in RUQ, non-tender.  Extremities: edema > 3+. RUE PIC non-tender.    Results for orders placed or performed during the hospital encounter of 07/20/14 (from the past 48 hour(s))  Glucose, capillary     Status: Abnormal   Collection Time: 07/23/14  8:46 PM  Result Value Ref Range   Glucose-Capillary 123 (H) 65 - 99 mg/dL  Glucose, capillary     Status: Abnormal   Collection Time: 07/23/14 10:12 PM  Result Value Ref Range   Glucose-Capillary 127  (H) 65 - 99 mg/dL  Glucose, capillary     Status: Abnormal   Collection Time: 07/23/14 11:52 PM  Result Value Ref Range   Glucose-Capillary 135 (H) 65 - 99 mg/dL  Urinalysis, Routine w reflex microscopic (not at [facility])     Status: Abnormal   Collection Time: 07/24/14  3:29 AM  Result Value Ref Range   Color, Urine YELLOW YELLOW   APPearance CLOUDY (A) CLEAR   Specific Gravity, Urine 1.011 1.005 - 1.030   pH 6.0 5.0 - 8.0   Glucose, UA NEGATIVE NEGATIVE mg/dL   Hgb urine dipstick NEGATIVE NEGATIVE   Bilirubin Urine NEGATIVE NEGATIVE   Ketones, ur NEGATIVE NEGATIVE mg/dL   Protein, ur NEGATIVE NEGATIVE mg/dL   Urobilinogen, UA 1.0 0.0 - 1.0 mg/dL  Nitrite NEGATIVE NEGATIVE   Leukocytes, UA NEGATIVE NEGATIVE    Comment: MICROSCOPIC NOT DONE ON URINES WITH NEGATIVE PROTEIN, BLOOD, LEUKOCYTES, NITRITE, OR GLUCOSE <1000 mg/dL.  Glucose, capillary     Status: Abnormal   Collection Time: 07/24/14  4:03 AM  Result Value Ref Range   Glucose-Capillary 144 (H) 65 - 99 mg/dL  Glucose, capillary     Status: Abnormal   Collection Time: 07/24/14  7:52 AM  Result Value Ref Range   Glucose-Capillary 108 (H) 65 - 99 mg/dL  Vancomycin, trough     Status: None   Collection Time: 07/24/14 11:20 AM  Result Value Ref Range   Vancomycin Tr 20 10.0 - 20.0 ug/mL  Glucose, capillary     Status: Abnormal   Collection Time: 07/24/14 11:50 AM  Result Value Ref Range   Glucose-Capillary 126 (H) 65 - 99 mg/dL  Glucose, capillary     Status: Abnormal   Collection Time: 07/24/14  3:50 PM  Result Value Ref Range   Glucose-Capillary 208 (H) 65 - 99 mg/dL  Glucose, capillary     Status: Abnormal   Collection Time: 07/24/14 10:34 PM  Result Value Ref Range   Glucose-Capillary 157 (H) 65 - 99 mg/dL  Procalcitonin     Status: None   Collection Time: 07/25/14  3:55 AM  Result Value Ref Range   Procalcitonin 0.99 ng/mL    Comment:        Interpretation: PCT > 0.5 ng/mL and <= 2 ng/mL: Systemic infection  (sepsis) is possible, but other conditions are known to elevate PCT as well. (NOTE)         ICU PCT Algorithm               Non ICU PCT Algorithm    ----------------------------     ------------------------------         PCT < 0.25 ng/mL                 PCT < 0.1 ng/mL     Stopping of antibiotics            Stopping of antibiotics       strongly encouraged.               strongly encouraged.    ----------------------------     ------------------------------       PCT level decrease by               PCT < 0.25 ng/mL       >= 80% from peak PCT       OR PCT 0.25 - 0.5 ng/mL          Stopping of antibiotics                                             encouraged.     Stopping of antibiotics           encouraged.    ----------------------------     ------------------------------       PCT level decrease by              PCT >= 0.25 ng/mL       < 80% from peak PCT        AND PCT >= 0.5 ng/mL             Continuing antibiotics  encouraged.       Continuing antibiotics            encouraged.    ----------------------------     ------------------------------     PCT level increase compared          PCT > 0.5 ng/mL         with peak PCT AND          PCT >= 0.5 ng/mL             Escalation of antibiotics                                          strongly encouraged.      Escalation of antibiotics        strongly encouraged.   Glucose, capillary     Status: Abnormal   Collection Time: 07/25/14  7:52 AM  Result Value Ref Range   Glucose-Capillary 109 (H) 65 - 99 mg/dL  ABO/Rh     Status: None   Collection Time: 07/25/14  8:21 AM  Result Value Ref Range   ABO/RH(D) AB POS   CBC     Status: Abnormal   Collection Time: 07/25/14  8:44 AM  Result Value Ref Range   WBC 7.8 4.0 - 10.5 K/uL   RBC 3.87 3.87 - 5.11 MIL/uL   Hemoglobin 10.8 (L) 12.0 - 15.0 g/dL   HCT 35.9 (L) 36.0 - 46.0 %   MCV 92.8 78.0 - 100.0 fL   MCH 27.9 26.0 - 34.0 pg   MCHC  30.1 30.0 - 36.0 g/dL   RDW 14.2 11.5 - 15.5 %   Platelets 291 150 - 400 K/uL  Comprehensive metabolic panel     Status: Abnormal   Collection Time: 07/25/14  8:44 AM  Result Value Ref Range   Sodium 143 135 - 145 mmol/L   Potassium 2.7 (LL) 3.5 - 5.1 mmol/L    Comment: REPEATED TO VERIFY CRITICAL RESULT CALLED TO, READ BACK BY AND VERIFIED WITH: Colin Benton. RN AT [phone] 07/25/14 MULLINS,T    Chloride 109 101 - 111 mmol/L   CO2 27 22 - 32 mmol/L   Glucose, Bld 144 (H) 65 - 99 mg/dL   BUN <5 (L) 6 - 20 mg/dL   Creatinine, Ser 0.93 0.44 - 1.00 mg/dL   Calcium 8.0 (L) 8.9 - 10.3 mg/dL   Total Protein 7.4 6.5 - 8.1 g/dL   Albumin 2.3 (L) 3.5 - 5.0 g/dL   AST 22 15 - 41 U/L   ALT 13 (L) 14 - 54 U/L   Alkaline Phosphatase 556 (H) 38 - 126 U/L   Total Bilirubin 1.1 0.3 - 1.2 mg/dL   GFR calc non Af Amer >60 >60 mL/min   GFR calc Af Amer >60 >60 mL/min    Comment: (NOTE) The eGFR has been calculated using the CKD EPI equation. This calculation has not been validated in all clinical situations. eGFR's persistently <60 mL/min signify possible Chronic Kidney Disease.    Anion gap 7 5 - 15  APTT     Status: Abnormal   Collection Time: 07/25/14  8:44 AM  Result Value Ref Range   aPTT 42 (H) 24 - 37 seconds    Comment:        IF BASELINE aPTT IS ELEVATED, SUGGEST PATIENT RISK ASSESSMENT BE USED TO DETERMINE APPROPRIATE ANTICOAGULANT THERAPY.   Protime-INR  Status: Abnormal   Collection Time: 07/25/14  8:44 AM  Result Value Ref Range   Prothrombin Time 15.7 (H) 11.6 - 15.2 seconds   INR 1.23 0.00 - 1.49  Type and screen     Status: None   Collection Time: 07/25/14  8:44 AM  Result Value Ref Range   ABO/RH(D) AB POS    Antibody Screen NEG    Sample Expiration 07/28/2014   Magnesium     Status: None   Collection Time: 07/25/14  8:50 AM  Result Value Ref Range   Magnesium 1.8 1.7 - 2.4 mg/dL  Glucose, capillary     Status: Abnormal   Collection Time: 07/25/14 11:55 AM    Result Value Ref Range   Glucose-Capillary 204 (H) 65 - 99 mg/dL  Glucose, capillary     Status: Abnormal   Collection Time: 07/25/14  4:11 PM  Result Value Ref Range   Glucose-Capillary 178 (H) 65 - 99 mg/dL      Component Value Date/Time   SDES COMMON BILE DUCT 07/21/2014 1042   SPECREQUEST Immunocompromised 07/21/2014 1042   CULT  07/21/2014 1042    ABUNDANT STAPHYLOCOCCUS SPECIES (COAGULASE NEGATIVE) CALL MICROBIOLOGY LAB IF SENSITIVITIES ARE REQUIRED. Performed at [facility] 07/24/2014 FINAL 07/21/2014 1042   Dg Chest 2 View  07/25/2014   CLINICAL DATA:  Sepsis.  EXAM: CHEST  2 VIEW  COMPARISON:  Jun 03, 2014.  FINDINGS: Stable cardiomediastinal silhouette. Hypoinflation of the lungs is noted. No pneumothorax or pleural effusion is noted. Right-sided PICC line is noted with distal tip in the expected position of the SVC. Mild central pulmonary vascular congestion is noted. Probable minimal bibasilar subsegmental atelectasis. Bony thorax is intact.  IMPRESSION: Hypoinflation of the lungs. Mild central pulmonary vascular congestion. Probable minimal bibasilar subsegmental atelectasis.   Electronically Signed   By: Marijo Conception, M.D.   On: 07/25/2014 10:40   Recent Results (from the past 240 hour(s))  Culture, blood (routine x 2)     Status: None (Preliminary result)   Collection Time: 07/21/14  4:59 AM  Result Value Ref Range Status   Specimen Description BLOOD RIGHT HAND  Final   Special Requests BOTTLES DRAWN AEROBIC ONLY 2.5ML  Final   Culture   Final    NO GROWTH 4 DAYS Performed at [facility]    Report Status PENDING  Incomplete  Culture, blood (routine x 2)     Status: None (Preliminary result)   Collection Time: 07/21/14  4:59 AM  Result Value Ref Range Status   Specimen Description BLOOD RIGHT ANTECUBITAL  Final   Special Requests BOTTLES DRAWN AEROBIC AND ANAEROBIC 5.5ML  Final   Culture   Final    NO GROWTH 4 DAYS Performed at  [facility]    Report Status PENDING  Incomplete  Body fluid culture     Status: None   Collection Time: 07/21/14 10:42 AM  Result Value Ref Range Status   Specimen Description COMMON BILE DUCT  Final   Special Requests Immunocompromised  Final   Gram Stain   Final    NO WBC SEEN MODERATE GRAM POSITIVE COCCI IN CLUSTERS    Culture   Final    ABUNDANT STAPHYLOCOCCUS SPECIES (COAGULASE NEGATIVE) CALL MICROBIOLOGY LAB IF SENSITIVITIES ARE REQUIRED. Performed at [facility]    Report Status 07/24/2014 FINAL  Final      07/25/2014, 4:56 PM     LOS: 4 days

## 2014-07-25 NOTE — Progress Notes (Signed)
  Subjective: Tolerating regular diet.  + BM's, no tenderness.  Drain OK  Objective: Vital signs in last 24 hours: Temp:  [98.2 F (36.8 C)-98.6 F (37 C)] 98.2 F (36.8 C) (07/18 0544) Pulse Rate:  [65-69] 65 (07/18 0544) Resp:  [16-18] 18 (07/18 0544) BP: (132-138)/(66-76) 132/66 mmHg (07/18 0544) SpO2:  [98 %-99 %] 99 % (07/18 0544) Weight:  [140.2 kg (309 lb 1.4 oz)] 140.2 kg (309 lb 1.4 oz) (07/18 0544) Last BM Date: 07/23/14 820 PO  Heart healthy diet 6 stools recorded yesterday Afebrile, VSS No labs   Intake/Output from previous day: 07/17 0701 - 07/18 0700 In: 1250 [P.O.:820; I.V.:80; IV Piggyback:350] Out: 4110 [Urine:4050; Drains:60] Intake/Output this shift: Total I/O In: -  Out: 250 [Urine:250]  General appearance: alert, cooperative and no distress GI: soft, non-tender; bowel sounds normal; no masses,  no organomegaly and drain in place and working well.  Lab Results:   Recent Labs  07/23/14 0845  WBC 13.8*  HGB 10.3*  HCT 34.0*  PLT 262    BMET No results for input(s): NA, K, CL, CO2, GLUCOSE, BUN, CREATININE, CALCIUM in the last 72 hours. PT/INR No results for input(s): LABPROT, INR in the last 72 hours.   Recent Labs Lab 07/20/14 2103 07/21/14 0459 07/22/14 0500  AST 110* 54* 28  ALT 25 21 15   ALKPHOS 398* 330* 288*  BILITOT 2.7* 2.1* 1.7*  PROT 8.6* 7.6 7.1  ALBUMIN 3.2* 2.7* 2.4*     Lipase     Component Value Date/Time   LIPASE <10* 07/20/2014 2103     Studies/Results: No results found.  Medications: . feeding supplement (ENSURE ENLIVE)  237 mL Oral BID BM  . furosemide  40 mg Oral Daily  . gabapentin  400 mg Oral TID  . heparin subcutaneous  5,000 Units Subcutaneous 3 times per day  . imipenem-cilastatin  500 mg Intravenous 3 times per day  . insulin aspart  0-9 Units Subcutaneous TID WC & HS  . insulin glargine  10 Units Subcutaneous BID  . saccharomyces boulardii  250 mg Oral BID  . sodium chloride  3 mL  Intravenous Q12H  . vancomycin  750 mg Intravenous Q12H    Assessment/Plan Sepsis\cholecystitis with Percutaneous drain 06/09/14 Drain study 07/18/14 with patent cystic duct and CBD, ongoing filling defects. Metastatic carcinoid with tumor progression in the liver Encephalopathy - Improved Hypertension AODM Acute kidney injury Hx Chronic diastolic CHF Body mass index is 44.35  Antibiotics:  5 days of antibiotics this admit; Currently Day 4 Vancomycin/day 5 of Imipenem-cilastatin DVT:  Heparin/SCD   Plan:  Dr. Excell Seltzer will review today, I will update her labs check coagulation studies and decide on surgery later today. Pre op CXR today,  She appears quite stable, but Medical clearance for surgery needs to be confirmed.  She reports she is very sedentary at home.       LOS: 4 days    Yesenia Lyons 07/25/2014

## 2014-07-25 NOTE — Progress Notes (Signed)
CRITICAL VALUE ALERT  Critical value received:  2.7 Potassium  Date of notification:  t  Time of notification:  n  Critical value read back:Yes.    Nurse who received alert:  Kelby Fam  MD notified (1st page):  Dr Clementeen Graham   Time of first page:  [phone]  MD notified (2nd page):  Time of second page:  Responding MD:  Dr Clementeen Graham  Time MD responded:  [phone]

## 2014-07-25 NOTE — Progress Notes (Signed)
PT Cancellation Note  Patient Details Name: Yesenia Lyons MRN: [id number] DOB: 11-23-1950   Cancelled Treatment:    Reason Eval/Treat Not Completed: Medical issues which prohibited therapy (low K+ this morning and possible surgery this afternoon)   Kaytlynn Kochan,KATHrine E 07/25/2014, 12:58 PM Carmelia Bake, PT, DPT 07/25/2014 Pager: [phone]

## 2014-07-25 NOTE — Care Management Important Message (Signed)
Important Message  Patient Details  Name: Yesenia Lyons MRN: [id number] Date of Birth: [birth year]   Medicare Important Message Given:  [facility] notification given    Camillo Flaming 07/25/2014, 12:23 [place] Message  Patient Details  Name: Yesenia Lyons MRN: [id number] Date of Birth: December 12, 1950   Medicare Important Message Given:  Yes-second notification given    Camillo Flaming 07/25/2014, 12:23 PM

## 2014-07-25 NOTE — Clinical Documentation Improvement (Signed)
Possible Clinical Conditions?  Severe Malnutrition   Protein Calorie Malnutrition Severe Protein Calorie Malnutrition Other Condition Cannot clinically determine  Supporting Information: Initial Nutrition Assessment by Clayton Bibles, RD at 07/22/2014 10:52 AM   DOCUMENTATION CODES:  Severe malnutrition in context of acute illness/injury, Morbid obesity   INTERVENTION:  Diet advancement per MD  When diet is advanced, pt would like Ensure Enlive po BID, each supplement provides 350 kcal and 20 grams of protein  RD to continue to monitor  NUTRITION DIAGNOSIS:  Malnutrition related to acute illness as evidenced by percent weight loss, energy intake < or equal to 50% for > or equal to 5 days.   Thank You, Alessandra Grout, RN, BSN, CCDS,Clinical Documentation Specialist:  [phone]  [phone]=[employer]- Health Information Management

## 2014-07-25 NOTE — Progress Notes (Signed)
ANTIBIOTIC CONSULT NOTE - FOLLOW UP  Pharmacy Consult for Primaxin, Vancomycin Indication: intra-abdominal infection  Allergies  Allergen Reactions  . Penicillins Itching and Swelling    Patient Measurements: Height: 5\' 10"  (177.8 cm) Weight: (!) 309 lb 1.4 oz (140.2 kg) IBW/kg (Calculated) : 68.5   Vital Signs: Temp: 98.3 F (36.8 C) (07/18 1414) Temp Source: Oral (07/18 1414) BP: 139/63 mmHg (07/18 1414) Pulse Rate: 66 (07/18 1414) Intake/Output from previous day: 07/17 0701 - 07/18 0700 In: 1250 [P.O.:820; I.V.:80; IV Piggyback:350] Out: 4110 [Urine:4050; Drains:60] Intake/Output from this shift: Total I/O In: 280 [P.O.:240; I.V.:40] Out: 250 [Urine:250]  Labs:  Recent Labs  07/23/14 0845 07/25/14 0844  WBC 13.8* 7.8  HGB 10.3* 10.8*  PLT 262 291  CREATININE  --  0.93   Estimated Creatinine Clearance: 92.5 mL/min (by C-G formula based on Cr of 0.93).  Recent Labs  07/24/14 1120  [place] 20     Microbiology: Recent Results (from the past 720 hour(s))  Culture, blood (routine x 2)     Status: None (Preliminary result)   Collection Time: 07/21/14  4:59 AM  Result Value Ref Range Status   Specimen Description BLOOD RIGHT HAND  Final   Special Requests BOTTLES DRAWN AEROBIC ONLY 2.5ML  Final   Culture   Final    NO GROWTH 4 DAYS Performed at [facility]    Report Status PENDING  Incomplete  Culture, blood (routine x 2)     Status: None (Preliminary result)   Collection Time: 07/21/14  4:59 AM  Result Value Ref Range Status   Specimen Description BLOOD RIGHT ANTECUBITAL  Final   Special Requests BOTTLES DRAWN AEROBIC AND ANAEROBIC 5.5ML  Final   Culture   Final    NO GROWTH 4 DAYS Performed at [facility]    Report Status PENDING  Incomplete  Body fluid culture     Status: None   Collection Time: 07/21/14 10:42 AM  Result Value Ref Range Status   Specimen Description COMMON BILE DUCT  Final   Special Requests  Immunocompromised  Final   Gram Stain   Final    NO WBC SEEN MODERATE GRAM POSITIVE COCCI IN CLUSTERS    Culture   Final    ABUNDANT STAPHYLOCOCCUS SPECIES (COAGULASE NEGATIVE) CALL MICROBIOLOGY LAB IF SENSITIVITIES ARE REQUIRED. Performed at [facility]    Report Status 07/24/2014 FINAL  Final   Assessment: 70 yoF s/p placement of cholecystostomy tube in early June 2016 now with worsening RUQ abdominal pain and low grade fever. Started on Cipro/Flagyl per Rx for intra-abdominal infection, now to switch to Primaxin.  7/13 >> Cipro >> 7/14 7/14 >> Flagyl >> 7/14 7/14 >> Primaxin >>  7/15 >> Vanc >>  Temp: AF WBC: improved to wnl Renal: SCr improved to wnl; CrCl 93CG, 68N  7/14 blood x2 : ngtd 7/14 fluid from common bile duct: IP; gram stain showed moderate GPC in clusters->Coag neg Staph  Drug level / dose changes info: 7/17: VT = 20(after 5th dose) on 1g q12h, reduce to 750mg  q12h.  Goal of Therapy:  Eradication of infection Appropriate antibiotic dosing for indication and renal function Vancomycin trough 15-20  Plan:  Day 3 antibiotics  Since SCr has improved, increase Primaxin from 500mg  IV q8h to q6h.  Cont Vanc 750mg  IV q12h.  Follow-up on culture results.  Follow renal function and de-escalation of antibiotics.  Romeo Rabon, PharmD, pager [phone]. 07/25/2014,3:07 PM.

## 2014-07-26 ENCOUNTER — Encounter (HOSPITAL_COMMUNITY): Payer: Self-pay | Admitting: Anesthesiology

## 2014-07-26 ENCOUNTER — Inpatient Hospital Stay (HOSPITAL_COMMUNITY): Payer: Medicare Other | Admitting: Anesthesiology

## 2014-07-26 ENCOUNTER — Encounter (HOSPITAL_COMMUNITY)
Admission: EM | Disposition: A | Discharge status: Home / Self Care | Payer: Self-pay | Source: Home / Self Care | Attending: Internal Medicine

## 2014-07-26 HISTORY — PX: CHOLECYSTECTOMY: SHX55

## 2014-07-26 LAB — BASIC METABOLIC PANEL
ANION GAP: 5 (ref 5–15)
BUN: 5 mg/dL — ABNORMAL LOW (ref 6–20)
CHLORIDE: 108 mmol/L (ref 101–111)
CO2: 28 mmol/L (ref 22–32)
CREATININE: 0.92 mg/dL (ref 0.44–1.00)
Calcium: 8 mg/dL — ABNORMAL LOW (ref 8.9–10.3)
GFR calc non Af Amer: >60 mL/min (ref >60)
Glucose, Bld: 117 mg/dL — ABNORMAL HIGH (ref 65–99)
Potassium: 3 mmol/L — ABNORMAL LOW (ref 3.5–5.1)
SODIUM: 141 mmol/L (ref 135–145)

## 2014-07-26 LAB — CULTURE, BLOOD (ROUTINE X 2)
Culture: NO GROWTH
Culture: NO GROWTH

## 2014-07-26 LAB — GLUCOSE, CAPILLARY
Glucose-Capillary: 109 mg/dL — ABNORMAL HIGH (ref 65–99)
Glucose-Capillary: 92 mg/dL (ref 65–99)
Glucose-Capillary: 139 mg/dL — ABNORMAL HIGH (ref 65–99)
Glucose-Capillary: 102 mg/dL — ABNORMAL HIGH (ref 65–99)
Glucose-Capillary: 101 mg/dL — ABNORMAL HIGH (ref 65–99)

## 2014-07-26 LAB — CLOSTRIDIUM DIFFICILE BY PCR: Toxigenic C. Difficile by PCR: NEGATIVE

## 2014-07-26 SURGERY — LAPAROSCOPIC CHOLECYSTECTOMY WITH INTRAOPERATIVE CHOLANGIOGRAM
Anesthesia: General

## 2014-07-26 MED ORDER — HYDROCODONE-ACETAMINOPHEN 5-325 MG PO TABS
1.0000 | ORAL_TABLET | ORAL | Status: DC | PRN
  Administered 2014-07-26 – 2014-07-27 (×3): 2 via ORAL
  Filled 2014-07-26 (×3): qty 2

## 2014-07-26 MED ORDER — FENTANYL CITRATE (PF) 100 MCG/2ML IJ SOLN
INTRAMUSCULAR | Status: DC | PRN
  Administered 2014-07-26 (×4): 50 ug via INTRAVENOUS

## 2014-07-26 MED ORDER — LIDOCAINE HCL 1 % IJ SOLN
INTRAMUSCULAR | Status: AC
  Filled 2014-07-26: qty 20

## 2014-07-26 MED ORDER — NEOSTIGMINE METHYLSULFATE 10 MG/10ML IV SOLN
INTRAVENOUS | Status: DC | PRN
  Administered 2014-07-26: 4 mg via INTRAVENOUS

## 2014-07-26 MED ORDER — BUPIVACAINE-EPINEPHRINE 0.25% -1:200000 IJ SOLN
INTRAMUSCULAR | Status: AC
  Filled 2014-07-26: qty 1

## 2014-07-26 MED ORDER — GLYCOPYRROLATE 0.2 MG/ML IJ SOLN
INTRAMUSCULAR | Status: DC | PRN
  Administered 2014-07-26: 0.2 mg via INTRAVENOUS
  Administered 2014-07-26: 0.6 mg via INTRAVENOUS

## 2014-07-26 MED ORDER — FENTANYL CITRATE (PF) 100 MCG/2ML IJ SOLN
INTRAMUSCULAR | Status: AC
  Filled 2014-07-26: qty 2

## 2014-07-26 MED ORDER — SUCCINYLCHOLINE CHLORIDE 20 MG/ML IJ SOLN
INTRAMUSCULAR | Status: DC | PRN
  Administered 2014-07-26: 120 mg via INTRAVENOUS

## 2014-07-26 MED ORDER — LIDOCAINE HCL (CARDIAC) 20 MG/ML IV SOLN
INTRAVENOUS | Status: DC | PRN
  Administered 2014-07-26: 50 mg via INTRAVENOUS

## 2014-07-26 MED ORDER — BUPIVACAINE-EPINEPHRINE 0.25% -1:200000 IJ SOLN
INTRAMUSCULAR | Status: DC | PRN
  Administered 2014-07-26: 20 mL

## 2014-07-26 MED ORDER — FENTANYL CITRATE (PF) 100 MCG/2ML IJ SOLN
25.0000 ug | INTRAMUSCULAR | Status: AC | PRN
  Administered 2014-07-26: 50 ug via INTRAVENOUS
  Administered 2014-07-26: 25 ug via INTRAVENOUS
  Administered 2014-07-26 (×2): 50 ug via INTRAVENOUS
  Administered 2014-07-26 (×2): 25 ug via INTRAVENOUS

## 2014-07-26 MED ORDER — ROCURONIUM BROMIDE 100 MG/10ML IV SOLN
INTRAVENOUS | Status: DC | PRN
  Administered 2014-07-26 (×2): 10 mg via INTRAVENOUS
  Administered 2014-07-26: 30 mg via INTRAVENOUS

## 2014-07-26 MED ORDER — LIDOCAINE HCL (CARDIAC) 20 MG/ML IV SOLN
INTRAVENOUS | Status: AC
  Filled 2014-07-26: qty 5

## 2014-07-26 MED ORDER — ONDANSETRON HCL 4 MG/2ML IJ SOLN
INTRAMUSCULAR | Status: AC
  Filled 2014-07-26: qty 2

## 2014-07-26 MED ORDER — BACITRACIN ZINC 500 UNIT/GM EX OINT
TOPICAL_OINTMENT | CUTANEOUS | Status: AC
  Filled 2014-07-26: qty 28.35

## 2014-07-26 MED ORDER — LACTATED RINGERS IR SOLN
Status: DC | PRN
  Administered 2014-07-26: 1

## 2014-07-26 MED ORDER — HEPARIN SODIUM (PORCINE) 5000 UNIT/ML IJ SOLN
5000.0000 [IU] | Freq: Three times a day (TID) | INTRAMUSCULAR | Status: DC
  Administered 2014-07-27 – 2014-08-01 (×15): 5000 [IU] via SUBCUTANEOUS
  Filled 2014-07-26 (×18): qty 1

## 2014-07-26 MED ORDER — PHENYLEPHRINE HCL 10 MG/ML IJ SOLN
INTRAMUSCULAR | Status: AC
  Filled 2014-07-26: qty 1

## 2014-07-26 MED ORDER — ONDANSETRON HCL 4 MG/2ML IJ SOLN
INTRAMUSCULAR | Status: DC | PRN
  Administered 2014-07-26: 4 mg via INTRAVENOUS

## 2014-07-26 MED ORDER — LACTATED RINGERS IV SOLN
INTRAVENOUS | Status: DC
  Administered 2014-07-26: 16:00:00 via INTRAVENOUS
  Administered 2014-07-26: 1000 mL via INTRAVENOUS

## 2014-07-26 MED ORDER — PROPOFOL 10 MG/ML IV BOLUS
INTRAVENOUS | Status: DC | PRN
  Administered 2014-07-26: 150 mg via INTRAVENOUS

## 2014-07-26 MED ORDER — FENTANYL CITRATE (PF) 250 MCG/5ML IJ SOLN
INTRAMUSCULAR | Status: AC
  Filled 2014-07-26: qty 5

## 2014-07-26 MED ORDER — POTASSIUM CHLORIDE IN NACL 40-0.9 MEQ/L-% IV SOLN
INTRAVENOUS | Status: DC
  Administered 2014-07-26 – 2014-07-29 (×5): 100 mL/h via INTRAVENOUS
  Filled 2014-07-26 (×10): qty 1000

## 2014-07-26 MED ORDER — PROPOFOL 10 MG/ML IV BOLUS
INTRAVENOUS | Status: AC
  Filled 2014-07-26: qty 20

## 2014-07-26 MED ORDER — METHOCARBAMOL 500 MG PO TABS: 1000.0000 mg | ORAL_TABLET | Freq: Three times a day (TID) | ORAL | Status: DC | PRN

## 2014-07-26 SURGICAL SUPPLY — 34 items
APPLIER CLIP ROT 10 11.4 M/L (STAPLE) ×3
CATH REDDICK CHOLANGI 4FR 50CM (CATHETERS) IMPLANT
CHLORAPREP W/TINT 26ML (MISCELLANEOUS) ×3 IMPLANT
CLIP APPLIE ROT 10 11.4 M/L (STAPLE) ×1 IMPLANT
COVER MAYO STAND STRL (DRAPES) ×3 IMPLANT
COVER SURGICAL LIGHT HANDLE (MISCELLANEOUS) IMPLANT
DECANTER SPIKE VIAL GLASS SM (MISCELLANEOUS) ×3 IMPLANT
DEVICE TROCAR PUNCTURE CLOSURE (ENDOMECHANICALS) ×3 IMPLANT
DRAPE C-ARM 42X120 X-RAY (DRAPES) ×3 IMPLANT
DRAPE LAPAROSCOPIC ABDOMINAL (DRAPES) ×3 IMPLANT
ELECT REM PT RETURN 9FT ADLT (ELECTROSURGICAL) ×3
ELECTRODE REM PT RTRN 9FT ADLT (ELECTROSURGICAL) ×1 IMPLANT
EVACUATOR SILICONE 100CC (DRAIN) ×3 IMPLANT
GLOVE BIOGEL PI IND STRL 7.5 (GLOVE) ×1 IMPLANT
GLOVE BIOGEL PI INDICATOR 7.5 (GLOVE) ×2
GLOVE ECLIPSE 7.5 STRL STRAW (GLOVE) ×3 IMPLANT
GOWN STRL REUS W/TWL XL LVL3 (GOWN DISPOSABLE) ×12 IMPLANT
HEMOSTAT SNOW SURGICEL 2X4 (HEMOSTASIS) IMPLANT
KIT BASIN OR (CUSTOM PROCEDURE TRAY) ×3 IMPLANT
LIQUID BAND (GAUZE/BANDAGES/DRESSINGS) ×3 IMPLANT
NS IRRIG 1000ML POUR BTL (IV SOLUTION) ×3 IMPLANT
POUCH RETRIEVAL ECOSAC 10 (ENDOMECHANICALS) ×1 IMPLANT
POUCH RETRIEVAL ECOSAC 10MM (ENDOMECHANICALS) ×2
POUCH SPECIMEN RETRIEVAL 10MM (ENDOMECHANICALS) IMPLANT
SCISSORS LAP 5X35 DISP (ENDOMECHANICALS) ×3 IMPLANT
SET CHOLANGIOGRAPH MIX (MISCELLANEOUS) ×3 IMPLANT
SET IRRIG TUBING LAPAROSCOPIC (IRRIGATION / IRRIGATOR) ×3 IMPLANT
SHEARS HARMONIC ACE PLUS 36CM (ENDOMECHANICALS) ×3 IMPLANT
SLEEVE XCEL OPT CAN 5 100 (ENDOMECHANICALS) ×6 IMPLANT
SUT MNCRL AB 4-0 PS2 18 (SUTURE) ×3 IMPLANT
TRAY LAPAROSCOPIC (CUSTOM PROCEDURE TRAY) ×3 IMPLANT
TROCAR BLADELESS OPT 5 100 (ENDOMECHANICALS) ×3 IMPLANT
TROCAR XCEL BLUNT TIP 100MML (ENDOMECHANICALS) ×3 IMPLANT
TROCAR XCEL NON-BLD 11X100MML (ENDOMECHANICALS) ×3 IMPLANT

## 2014-07-26 NOTE — Transfer of Care (Signed)
Immediate Anesthesia Transfer of Care Note  Patient: Yesenia Lyons  Procedure(s) Performed: Procedure(s): LAPAROSCOPIC CHOLECYSTECTOMY  (N/A)  Patient Location: PACU  Anesthesia Type:General  Level of Consciousness: awake, alert  and patient cooperative  Airway & Oxygen Therapy: Patient Spontanous Breathing and Patient connected to face mask oxygen  Post-op Assessment: Report given to RN and Post -op Vital signs reviewed and stable  Post vital signs: Reviewed and stable  Last Vitals:  Filed Vitals:   07/26/14 1100  BP: 128/68  Pulse:   Temp:   Resp:     Complications: No apparent anesthesia complications

## 2014-07-26 NOTE — Progress Notes (Signed)
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 Burr Medico), diabetes mellitus who was hospitalized one month back for sepsis due to acute cholecystitis and had percutaneous cholecystostomy tube placed presented to the ED with sudden onset of right upper quadrant abdominal pain after she had a drain study done on 7/11. This is associated with nausea and was febrile in the ED with mildly elevated LFTs. She also has significant leukocytosis and elevated lactic acid meeting criteria for sepsis. A CT scan of the abdomen and pelvis showing progression of hepatic and metastatic disease.  Assessment/Plan: Sepsis on admission Currently resolved. Possibly related to ascending cholangitis versus progressive liver metastases. On on Primaxin for broad coverage. Cholecystostomy drain culture growing coag-negative staph. on empiric vancomycin. Recommend to continue vancomycin for now and may need a short course of antibiotic coverage postop. Blood cultures negative.   -Laparoscopic cholecystectomy scheduled for today.    Type 2 diabetes mellitus Monitor on sliding insulin .continue home dose Lantus.  Metastatic carcinoid tumor Follows with Dr. Burr Medico. CT on admission shows progressive liver and nodal metastases. On octreotide as outpatient. Seen by Dr Burr Medico and liver targeted therapy by IR once acute illness resolves.  Acute kidney injury Secondary to sepsis. Improved with IV hydration. Continue Lasix.  Hypokalemia Replenish kcl . Resumed Aldactone.  Diarrhea  Stool for C. difficile negative.   Diet: Nothing by mouth for surgery  DVT prophylaxis: Subcutaneous heparin  code Status: Full code  Family Communication: None at bedside  Disposition Plan:  laparoscopic cholecystectomy on 7/19. PT eval post op. Possibly home  with home health in 48-72 hrs  Consultants:  [facility]   Dr Burr Medico  Infectious disease  Procedures:  CT abd and pelvis  Antibiotics:  IV cipro and flagyl x 1 dose   IV primaxin 7/14--  Iv vancomycin 7/15--  HPI/Subjective:  seen and examined. Reportedly has loose stools last 24 hours. Denies abdominal pain  Objective: Filed Vitals:   07/26/14 1100  BP: 128/68  Pulse:   Temp:   Resp:     Intake/Output Summary (Last 24 hours) at 07/26/14 1333 Last data filed at 07/25/14 2328  Gross per 24 hour  Intake    653 ml  Output    460 ml  Net    193 ml   Filed Weights   07/22/14 0030 07/25/14 0544 07/26/14 0500  Weight: 135.4 kg (298 lb 8.1 oz) 140.2 kg (309 lb 1.4 oz) 145.9 kg (321 lb 10.4 oz)    Exam:   General: Elderly morbidly obese female , not in distress  HEENT: , moist oral mucosa,   Chest: Clear to auscultation bilaterally  CVS: Normal S1 and S2, no murmurs rub or gallop  GI: Soft, nontender,  nondistended, percutaneous biliary drain in place, functioning well  Musculoskeletal: Warm, no edema    Data Reviewed: Basic Metabolic Panel:  Recent Labs Lab 07/20/14 2103 07/21/14 0459 07/22/14 0500 07/25/14 0844 07/25/14 0850 07/26/14 0420  NA 132* 134* 141 143  --  141  K 5.7* 3.6 3.3* 2.7*  --  3.0*  CL 96* 99* 108 109  --  108  CO2 25 26 27 27   --  28  GLUCOSE 242* 242* 160* 144*  --  117*  BUN 15 16 13  <5*  --  5*  CREATININE 1.34* 1.16* 1.05* 0.93  --  0.92  CALCIUM  8.6* 8.0* 7.8* 8.0*  --  8.0*  MG  --   --   --   --  1.8  --    Liver Function Tests:  Recent Labs Lab 07/20/14 2103 07/21/14 0459 07/22/14 0500 07/25/14 0844  AST 110* 54* 28 22  ALT 25 21 15  13*  ALKPHOS 398* 330* 288* 556*  BILITOT 2.7* 2.1* 1.7* 1.1  PROT 8.6* 7.6 7.1 7.4  ALBUMIN 3.2* 2.7* 2.4* 2.3*    Recent Labs Lab 07/20/14 2103  LIPASE <10*    Recent Labs Lab 07/22/14 0900  AMMONIA 30   CBC:  Recent Labs Lab 07/20/14 2103 07/21/14 0459  07/22/14 0500 07/23/14 0845 07/25/14 0844  WBC 22.9* 20.3* 19.2* 13.8* 7.8  NEUTROABS  --  15.4*  --   --   --   HGB 12.9 11.8* 11.1* 10.3* 10.8*  HCT 40.6 37.4 35.3* 34.0* 35.9*  MCV 90.8 90.1 89.4 92.1 92.8  PLT 271 260 226 262 291   Cardiac Enzymes: No results for input(s): CKTOTAL, CKMB, CKMBINDEX, TROPONINI in the last 168 hours. BNP (last 3 results) No results for input(s): BNP in the last 8760 hours.  ProBNP (last 3 results) No results for input(s): PROBNP in the last 8760 hours.  CBG:  Recent Labs Lab 07/25/14 1155 07/25/14 1611 07/25/14 2114 07/26/14 0848 07/26/14 1116  GLUCAP 204* 178* 215* 109* 92    Recent Results (from the past 240 hour(s))  Culture, blood (routine x 2)     Status: None (Preliminary result)   Collection Time: 07/21/14  4:59 AM  Result Value Ref Range Status   Specimen Description BLOOD RIGHT HAND  Final   Special Requests BOTTLES DRAWN AEROBIC ONLY 2.5ML  Final   Culture   Final    NO GROWTH 4 DAYS Performed at [facility]    Report Status PENDING  Incomplete  Culture, blood (routine x 2)     Status: None (Preliminary result)   Collection Time: 07/21/14  4:59 AM  Result Value Ref Range Status   Specimen Description BLOOD RIGHT ANTECUBITAL  Final   Special Requests BOTTLES DRAWN AEROBIC AND ANAEROBIC 5.5ML  Final   Culture   Final    NO GROWTH 4 DAYS Performed at [facility]    Report Status PENDING  Incomplete  Body fluid culture     Status: None   Collection Time: 07/21/14 10:42 AM  Result Value Ref Range Status   Specimen Description COMMON BILE DUCT  Final   Special Requests Immunocompromised  Final   Gram Stain   Final    NO WBC SEEN MODERATE GRAM POSITIVE COCCI IN CLUSTERS    Culture   Final    ABUNDANT STAPHYLOCOCCUS SIMULANS Performed at [facility]    Report Status 07/25/2014 FINAL  Final   Organism ID, Bacteria STAPHYLOCOCCUS SIMULANS  Final      Susceptibility   Staphylococcus simulans  - MIC*    CIPROFLOXACIN >=8 RESISTANT Resistant     ERYTHROMYCIN >=8 RESISTANT Resistant     GENTAMICIN <=0.5 SENSITIVE Sensitive     OXACILLIN <=0.25 SENSITIVE Sensitive     TETRACYCLINE <=1 SENSITIVE Sensitive     VANCOMYCIN <=0.5 SENSITIVE Sensitive     TRIMETH/SULFA <=10 SENSITIVE Sensitive     CLINDAMYCIN <=0.25 RESISTANT Resistant     RIFAMPIN <=0.5 SENSITIVE Sensitive     Inducible Clindamycin POSITIVE Resistant     * ABUNDANT STAPHYLOCOCCUS SIMULANS  Surgical pcr screen     Status:  None   Collection Time: 07/25/14  7:58 PM  Result Value Ref Range Status   MRSA, PCR NEGATIVE NEGATIVE Final   Staphylococcus aureus NEGATIVE NEGATIVE Final    Comment:        The Xpert SA Assay (FDA approved for NASAL specimens in patients over 65 years of age), is one component of a comprehensive surveillance program.  Test performance has been validated by [facility] for patients greater than or equal to 63 year old. It is not intended to diagnose infection nor to guide or monitor treatment.   Clostridium Difficile by PCR (not at [facility])     Status: None   Collection Time: 07/26/14  8:51 AM  Result Value Ref Range Status   C difficile by pcr NEGATIVE NEGATIVE Final     Studies: Dg Chest 2 View  07/25/2014   CLINICAL DATA:  Sepsis.  EXAM: CHEST  2 VIEW  COMPARISON:  Jun 03, 2014.  FINDINGS: Stable cardiomediastinal silhouette. Hypoinflation of the lungs is noted. No pneumothorax or pleural effusion is noted. Right-sided PICC line is noted with distal tip in the expected position of the SVC. Mild central pulmonary vascular congestion is noted. Probable minimal bibasilar subsegmental atelectasis. Bony thorax is intact.  IMPRESSION: Hypoinflation of the lungs. Mild central pulmonary vascular congestion. Probable minimal bibasilar subsegmental atelectasis.   Electronically Signed   By: Marijo Conception, M.D.   On: 07/25/2014 10:40    Scheduled Meds: . [MAR Hold] feeding supplement (ENSURE  ENLIVE)  237 mL Oral BID BM  . [MAR Hold] furosemide  40 mg Oral Daily  . [MAR Hold] gabapentin  400 mg Oral TID  . [MAR Hold] heparin subcutaneous  5,000 Units Subcutaneous 3 times per day  . [MAR Hold] imipenem-cilastatin  500 mg Intravenous Q6H  . [MAR Hold] insulin aspart  0-9 Units Subcutaneous TID WC & HS  . [MAR Hold] insulin glargine  10 Units Subcutaneous BID  . [MAR Hold] saccharomyces boulardii  250 mg Oral BID  . [MAR Hold] sodium chloride  3 mL Intravenous Q12H  . [MAR Hold] spironolactone  25 mg Oral Daily  . [MAR Hold] vancomycin  750 mg Intravenous Q12H   Continuous Infusions: . 0.9 % NaCl with KCl 40 mEq / L Stopped (07/26/14 1157)  . lactated ringers 1,000 mL (07/26/14 1158)     Time spent: 25 minutes    Gaige Sebo, [place] [facility] Pager [phone] If 7PM-7AM, please contact night-coverage at [email or web address], password [facility] 07/26/2014, 1:33 PM  LOS: 5 days

## 2014-07-26 NOTE — Progress Notes (Signed)
Subjective: No complaints this AM.  Holding AM PO meds for surgery.  I have started some fluids with K+ for her low K+.    Objective: Vital signs in last 24 hours: Temp:  [98.2 F (36.8 C)-98.7 F (37.1 C)] 98.7 F (37.1 C) (07/19 0551) Pulse Rate:  [66-71] 71 (07/19 0551) Resp:  [18-20] 20 (07/19 0551) BP: (133-139)/(63-86) 137/72 mmHg (07/19 0551) SpO2:  [97 %-99 %] 99 % (07/19 0551) Weight:  [145.9 kg (321 lb 10.4 oz)] 145.9 kg (321 lb 10.4 oz) (07/19 0500) Last BM Date: 07/25/14 600 PO recorded  NPO for surgery 10 stools recorded Afebrile, VSS K+ 3.0/Mag 1.8 yesterday Checking for C diff Intake/Output from previous day: 07/18 0701 - 07/19 0700 In: 933 [P.O.:600; I.V.:83; IV Piggyback:250] Out: 710 [Urine:650; Drains:60] Intake/Output this shift:    General appearance: alert, cooperative and no distress GI: soft, non-tender; bowel sounds normal; no masses,  no organomegaly and drain in place  Lab Results:   Recent Labs  07/23/14 0845 07/25/14 0844  WBC 13.8* 7.8  HGB 10.3* 10.8*  HCT 34.0* 35.9*  PLT 262 291    BMET  Recent Labs  07/25/14 0844 07/26/14 0420  NA 143 141  K 2.7* 3.0*  CL 109 108  CO2 27 28  GLUCOSE 144* 117*  BUN <5* 5*  CREATININE 0.93 0.92  CALCIUM 8.0* 8.0*   PT/INR  Recent Labs  07/25/14 0844  LABPROT 15.7*  INR 1.23     Recent Labs Lab 07/20/14 2103 07/21/14 0459 07/22/14 0500 07/25/14 0844  AST 110* 54* 28 22  ALT 25 21 15  13*  ALKPHOS 398* 330* 288* 556*  BILITOT 2.7* 2.1* 1.7* 1.1  PROT 8.6* 7.6 7.1 7.4  ALBUMIN 3.2* 2.7* 2.4* 2.3*     Lipase     Component Value Date/Time   LIPASE <10* 07/20/2014 2103     Studies/Results: Dg Chest 2 View  07/25/2014   CLINICAL DATA:  Sepsis.  EXAM: CHEST  2 VIEW  COMPARISON:  Jun 03, 2014.  FINDINGS: Stable cardiomediastinal silhouette. Hypoinflation of the lungs is noted. No pneumothorax or pleural effusion is noted. Right-sided PICC line is noted with distal tip  in the expected position of the SVC. Mild central pulmonary vascular congestion is noted. Probable minimal bibasilar subsegmental atelectasis. Bony thorax is intact.  IMPRESSION: Hypoinflation of the lungs. Mild central pulmonary vascular congestion. Probable minimal bibasilar subsegmental atelectasis.   Electronically Signed   By: Marijo Conception, M.D.   On: 07/25/2014 10:40    Medications: . feeding supplement (ENSURE ENLIVE)  237 mL Oral BID BM  . furosemide  40 mg Oral Daily  . gabapentin  400 mg Oral TID  . heparin subcutaneous  5,000 Units Subcutaneous 3 times per day  . imipenem-cilastatin  500 mg Intravenous Q6H  . insulin aspart  0-9 Units Subcutaneous TID WC & HS  . insulin glargine  10 Units Subcutaneous BID  . saccharomyces boulardii  250 mg Oral BID  . sodium chloride  3 mL Intravenous Q12H  . spironolactone  25 mg Oral Daily  . vancomycin  750 mg Intravenous Q12H   . 0.9 % NaCl with KCl 40 mEq / L      Assessment/Plan Sepsis\cholecystitis with Percutaneous drain 06/09/14 Drain study 07/18/14 with patent cystic duct and CBD, ongoing filling defects. Metastatic carcinoid with tumor progression in the liver Encephalopathy - Improved Hypertension AODM Acute kidney injury Hx Chronic diastolic CHF Body mass index is 44.35  Antibiotics:  6 days of antibiotics this admit; Currently Day 5 Vancomycin/day 6 of Imipenem-cilastatin DVT: Heparin/SCD   Plan:  For OR today, KCL added to IV for K+ replacement and fluids before surgery. Have not been received by floor so far.      LOS: 5 days    Yesenia Lyons 07/26/2014

## 2014-07-26 NOTE — Anesthesia Procedure Notes (Signed)
Procedure Name: Intubation Date/Time: 07/26/2014 12:49 PM Performed by: Glory Buff Pre-anesthesia Checklist: Patient identified, Emergency Drugs available, Suction available and Patient being monitored Patient Re-evaluated:Patient Re-evaluated prior to inductionOxygen Delivery Method: Circle System Utilized Preoxygenation: Pre-oxygenation with 100% oxygen Intubation Type: IV induction, Rapid sequence and Cricoid Pressure applied Ventilation: Mask ventilation without difficulty Laryngoscope Size: Mac and 4 Grade View: Grade I Tube type: Oral Tube size: 7.5 mm Number of attempts: 1 Airway Equipment and Method: Stylet and Oral airway Placement Confirmation: ETT inserted through vocal cords under direct vision,  positive ETCO2 and breath sounds checked- equal and bilateral Secured at: 21 cm Tube secured with: Tape Dental Injury: Teeth and Oropharynx as per pre-operative assessment

## 2014-07-26 NOTE — Anesthesia Postprocedure Evaluation (Signed)
  Anesthesia Post-op Note  Patient: Yesenia Lyons  Procedure(s) Performed: Procedure(s): LAPAROSCOPIC CHOLECYSTECTOMY  (N/A)  Patient Location: PACU  Anesthesia Type:General  Level of Consciousness: awake  Airway and Oxygen Therapy: Patient Spontanous Breathing  Post-op Pain: mild  Post-op Assessment: Post-op Vital signs reviewed              Post-op Vital Signs: Reviewed  Last Vitals:  Filed Vitals:   07/26/14 1515  BP:   Pulse: 79  Temp:   Resp: 27    Complications: No apparent anesthesia complications

## 2014-07-26 NOTE — Anesthesia Preprocedure Evaluation (Addendum)
Anesthesia Evaluation  Patient identified by MRN, date of birth, ID band Patient awake    Reviewed: Allergy & Precautions, NPO status , Patient's Chart, lab work & pertinent test results  Airway Mallampati: II  TM Distance: >3 FB Neck ROM: Full    Dental   Pulmonary pneumonia -,  breath sounds clear to auscultation        Cardiovascular hypertension, +CHF Rhythm:Regular Rate:Normal     Neuro/Psych    GI/Hepatic negative GI ROS, Neg liver ROS,   Endo/Other  diabetes  Renal/GU Renal disease     Musculoskeletal   Abdominal   Peds  Hematology   Anesthesia Other Findings   Reproductive/Obstetrics                            Anesthesia Physical Anesthesia Plan  ASA: III  Anesthesia Plan: General   Post-op Pain Management:    Induction: Intravenous  Airway Management Planned: Oral ETT  Additional Equipment:   Intra-op Plan:   Post-operative Plan: Possible Post-op intubation/ventilation  Informed Consent: I have reviewed the patients History and Physical, chart, labs and discussed the procedure including the risks, benefits and alternatives for the proposed anesthesia with the patient or authorized representative who has indicated his/her understanding and acceptance.   Dental advisory given  Plan Discussed with: CRNA and Anesthesiologist  Anesthesia Plan Comments:         Anesthesia Quick Evaluation

## 2014-07-26 NOTE — Op Note (Signed)
Preoperative Diagnosis: cholelithiasis and cholecystitis, status post percutaneous cholecystostomy  Postoprative Diagnosis: Same  Procedure: Procedure(s): LAPAROSCOPIC CHOLECYSTECTOMY    Surgeon: Excell Seltzer T   Assistants: Armandina Gemma  Anesthesia:  General endotracheal anesthesia  Indications: patient is approximately 6 weeks status post percutaneous cholecystostomy for acute cholecystitis. She has been found to have metastatic carcinoid tumor with multiple liver metastases. She was somewhat unstable at the time of her initial presentation but has recovered to a large degree. She however was recently readmitted with fever and abdominal pain entrance of the elevated LFTs consistent with recurrent cholecystitis despite good placement of her drain and patent cystic duct. Multiple gallstones noted. I discussed options with the patient extensively including continue with percutaneous drainage versus cholecystectomy. She will require chemotherapy and there is concern about recurrent infection and drain in place in the face of chemotherapy. After discussion regarding options and risks detailed extensively elsewhere we have elected to proceed with laparoscopic and possible open cholecystectomy.    Procedure Detail:  Patient was brought to the operating room, placed in the supine position on the operating table, and general endotracheal anesthesia induced. Orogastric tube was placed. PAS were in place. The abdomen was widely sterilely prepped and draped keeping the cholecystostomy tube just outside of the sterile field. Patient timeout was performed and correct procedure verified. Access was obtained with a 5 mm Optiview trocar in the left upper quadrant due to morbid obesity. Pneumoperitoneum was established without difficulty. There was no evidence of trocar injury. There were fairly extensive midline omental adhesions from previous surgery. I was able to go superiorly to those with the camera and  the right upper quadrant was clear. Under direct vision 25 mm trochars were placed in the right upper quadrant and more inferior laterally in the right abdomen. Through these 2 sites the midline omental adhesions were completely taken down. A 11 mm trocar was placed subxiphoid. The gallbladder was chronically inflamed but not tense or markedly inflamed. There was some cloudy purulent appearing material around the liver and underneath the gallbladder. Multiple liver metastases were noted consistent with CT scan findings. Omental adhesions were taken down off the fundus which were elevated over the liver. The infundibulum was cleared of omental adhesions and retracted inferolaterally. Exposure was difficult due to a large immobile liver. The additional left upper quadrant trocar was used to retract the duodenum and liver for better exposure. Peritoneum anterior posterior along the distal gallbladder was incised. Careful blunt and cautery dissection was carried down along the closed triangle. The distal gallbladder was gradually mobilized and the porta hepatis was fairly free of inflammation. With persistent dissection was clearly able to identify the cystic artery coursing up onto the gallbladder wall and the cystic duct. The cystic artery was dissected free on the gallbladder wall and divided between 2 proximal and 1 distal clip. This allowed more mobility in the distal gallbladder was further dissected off the hepatic plate until we had good 360 dissection of the distal gallbladder and the gallbladder cystic duct junction. The cystic duct was doubly clipped proximally clipped distally and divided. The gallbladder was then dissected free from its bed using hook cautery. This placed in a Endo Catch bag and brought out through the subxiphoid incision. Complete hemostasis was obtained in the gallbladder bed including a Surgicel pack. No evidence of bleeding or trocar injury or other problems. A 19 Blake drain was left  in the subhepatic space and brought out through one of the lateral trocar sites. All  CO2 was evacuated and trochars removed. Skin incisions were closed with subcuticular Monocryl and Dermabond. I placed a fascial stitch with the Endo Close at the subxiphoid fascia extraction site. Sponge and needle instrument counts were correct.    Findings: As above  Estimated Blood Loss:  less than 50 mL         Drains: 19 Blake drain subhepatic space  Blood Given: none          Specimens: gallbladder and contents        Complications:  * No complications entered in OR log *         Disposition: PACU - hemodynamically stable.         Condition: stable

## 2014-07-26 NOTE — Progress Notes (Signed)
INFECTIOUS DISEASE PROGRESS NOTE  ID: Yesenia Lyons is a 63 y.o. 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   Abtx:  Anti-infectives    Start     Dose/Rate Route Frequency Ordered Stop   07/25/14 2000  imipenem-cilastatin (PRIMAXIN) 500 mg in sodium chloride 0.9 % 100 mL IVPB     500 mg 200 mL/hr over 30 Minutes Intravenous Every 6 hours 07/25/14 1510     07/25/14 0000  vancomycin (VANCOCIN) IVPB 750 mg/150 ml premix     750 mg 150 mL/hr over 60 Minutes Intravenous Every 12 hours 07/24/14 1242     07/23/14 0000  vancomycin (VANCOCIN) IVPB 1000 mg/200 mL premix  Status:  Discontinued     1,000 mg 200 mL/hr over 60 Minutes Intravenous Every 12 hours 07/22/14 1047 07/24/14 1242   07/22/14 1200  vancomycin (VANCOCIN) 2,000 mg in sodium chloride 0.9 % 500 mL IVPB     2,000 mg 250 mL/hr over 120 Minutes Intravenous  Once 07/22/14 1046 07/22/14 1418   07/21/14 1400  imipenem-cilastatin (PRIMAXIN) 500 mg in sodium chloride 0.9 % 100 mL IVPB  Status:  Discontinued     500 mg 200 mL/hr over 30 Minutes Intravenous 3 times per day 07/21/14 0824 07/25/14 1510   07/21/14 1000  ciprofloxacin (CIPRO) IVPB 400 mg  Status:  Discontinued     400 mg 200 mL/hr over 60 Minutes Intravenous Every 12 hours 07/21/14 0622 07/21/14 0803   07/21/14 0830  imipenem-cilastatin (PRIMAXIN) 500 mg in sodium chloride 0.9 % 100 mL IVPB  Status:  Discontinued     500 mg 200 mL/hr over 30 Minutes Intravenous 3 times per day 07/21/14 0822 07/21/14 0824   07/21/14 0800  metroNIDAZOLE (FLAGYL) IVPB 500 mg  Status:  Discontinued     500 mg 100 mL/hr over 60 Minutes Intravenous Every 8 hours 07/21/14 0622 07/21/14  1018   07/20/14 2200  ciprofloxacin (CIPRO) IVPB 400 mg     400 mg 200 mL/hr over 60 Minutes Intravenous  Once 07/20/14 2154 07/21/14 0059   07/20/14 2200  metroNIDAZOLE (FLAGYL) IVPB 500 mg     500 mg 100 mL/hr over 60 Minutes Intravenous  Once 07/20/14 2154 07/21/14 0414      Medications:  Scheduled: . feeding supplement (ENSURE ENLIVE)  237 mL Oral BID BM  . fentaNYL      . fentaNYL      . furosemide  40 mg Oral Daily  . gabapentin  400 mg Oral TID  . [START ON 07/27/2014] heparin subcutaneous  5,000 Units Subcutaneous 3 times per day  . imipenem-cilastatin  500 mg Intravenous Q6H  . insulin aspart  0-9 Units Subcutaneous TID WC & HS  . insulin glargine  10 Units Subcutaneous BID  . saccharomyces boulardii  250 mg Oral BID  . sodium chloride  3 mL Intravenous Q12H  . spironolactone  25 mg Oral Daily  . vancomycin  750 mg Intravenous Q12H    Objective: Vital signs in last 24 hours: Temp:  [97.5 F (36.4 C)-98.7 F (37.1 C)] 97.9 F (36.6 C) (07/19 1635) Pulse Rate:  [63-82] 63 (07/19 1635) Resp:  [15-27] 15 (07/19 1635)  BP: (128-165)/(68-86) 128/80 mmHg (07/19 1635) SpO2:  [98 %-100 %] 100 % (07/19 1635) Weight:  [145.9 kg (321 lb 10.4 oz)] 145.9 kg (321 lb 10.4 oz) (07/19 0500)   General appearance: alert, cooperative and mild distress Resp: clear to auscultation bilaterally Cardio: regular rate and rhythm GI: normal findings: soft, non-tender and abnormal findings:  hypoactive bowel sounds  Lab Results  Recent Labs  07/25/14 0844 07/26/14 0420  WBC 7.8  --   HGB 10.8*  --   HCT 35.9*  --   NA 143 141  K 2.7* 3.0*  CL 109 108  CO2 27 28  BUN <5* 5*  CREATININE 0.93 0.92   Liver Panel  Recent Labs  07/25/14 0844  PROT 7.4  ALBUMIN 2.3*  AST 22  ALT 13*  ALKPHOS 556*  BILITOT 1.1   Sedimentation Rate No results for input(s): ESRSEDRATE in the last 72 hours. C-Reactive Protein No results for input(s): CRP in the last 72  hours.  Microbiology: Recent Results (from the past 240 hour(s))  Culture, blood (routine x 2)     Status: None   Collection Time: 07/21/14  4:59 AM  Result Value Ref Range Status   Specimen Description BLOOD RIGHT HAND  Final   Special Requests BOTTLES DRAWN AEROBIC ONLY 2.5ML  Final   Culture   Final    NO GROWTH 5 DAYS Performed at [facility]    Report Status 07/26/2014 FINAL  Final  Culture, blood (routine x 2)     Status: None   Collection Time: 07/21/14  4:59 AM  Result Value Ref Range Status   Specimen Description BLOOD RIGHT ANTECUBITAL  Final   Special Requests BOTTLES DRAWN AEROBIC AND ANAEROBIC 5.5ML  Final   Culture   Final    NO GROWTH 5 DAYS Performed at [facility]    Report Status 07/26/2014 FINAL  Final  Body fluid culture     Status: None   Collection Time: 07/21/14 10:42 AM  Result Value Ref Range Status   Specimen Description COMMON BILE DUCT  Final   Special Requests Immunocompromised  Final   Gram Stain   Final    NO WBC SEEN MODERATE GRAM POSITIVE COCCI IN CLUSTERS    Culture   Final    ABUNDANT STAPHYLOCOCCUS SIMULANS Performed at [facility]    Report Status 07/25/2014 FINAL  Final   Organism ID, Bacteria STAPHYLOCOCCUS SIMULANS  Final      Susceptibility   Staphylococcus simulans - MIC*    CIPROFLOXACIN >=8 RESISTANT Resistant     ERYTHROMYCIN >=8 RESISTANT Resistant     GENTAMICIN <=0.5 SENSITIVE Sensitive     OXACILLIN <=0.25 SENSITIVE Sensitive     TETRACYCLINE <=1 SENSITIVE Sensitive     VANCOMYCIN <=0.5 SENSITIVE Sensitive     TRIMETH/SULFA <=10 SENSITIVE Sensitive     CLINDAMYCIN <=0.25 RESISTANT Resistant     RIFAMPIN <=0.5 SENSITIVE Sensitive     Inducible Clindamycin POSITIVE Resistant     * ABUNDANT STAPHYLOCOCCUS SIMULANS  Surgical pcr screen     Status: None   Collection Time: 07/25/14  7:58 PM  Result Value Ref Range Status   MRSA, PCR NEGATIVE NEGATIVE Final   Staphylococcus aureus NEGATIVE  NEGATIVE Final    Comment:        The Xpert SA Assay (FDA approved for NASAL specimens in patients over 71 years of age), is one component of a comprehensive surveillance program.  Test performance has been validated by  [place] for patients greater than or equal to 40 year old. It is not intended to diagnose infection nor to guide or monitor treatment.   Clostridium Difficile by PCR (not at [facility])     Status: None   Collection Time: 07/26/14  8:51 AM  Result Value Ref Range Status   C difficile by pcr NEGATIVE NEGATIVE Final    Studies/Results: Dg Chest 2 View  07/25/2014   CLINICAL DATA:  Sepsis.  EXAM: CHEST  2 VIEW  COMPARISON:  Jun 03, 2014.  FINDINGS: Stable cardiomediastinal silhouette. Hypoinflation of the lungs is noted. No pneumothorax or pleural effusion is noted. Right-sided PICC line is noted with distal tip in the expected position of the SVC. Mild central pulmonary vascular congestion is noted. Probable minimal bibasilar subsegmental atelectasis. Bony thorax is intact.  IMPRESSION: Hypoinflation of the lungs. Mild central pulmonary vascular congestion. Probable minimal bibasilar subsegmental atelectasis.   Electronically Signed   By: Marijo Conception, M.D.   On: 07/25/2014 10:40     Assessment/Plan: Cholecystitis Coag Neg Staph Carcinoid tumor with mets DM2 Morbid Obesity Protein calorie malnutrition  Total days of antibiotics:   IV primaxin 7/14--  Iv vancomycin 7/15--  Would: stop vanco  Continue imipenem for now, routine post-op anbx.   If d/c home with anbx (again, per routine), she could go home on bactrim  available as needed.          Bobby Rumpf Infectious Diseases (pager) [phone] [email or web address] 07/26/2014, 5:18 PM  LOS: 5 days

## 2014-07-27 ENCOUNTER — Encounter (HOSPITAL_COMMUNITY): Payer: Self-pay | Admitting: General Surgery

## 2014-07-27 DIAGNOSIS — K830 Cholangitis: Secondary | ICD-10-CM

## 2014-07-27 DIAGNOSIS — E669 Obesity, unspecified: Secondary | ICD-10-CM

## 2014-07-27 LAB — COMPREHENSIVE METABOLIC PANEL
ALBUMIN: 2.3 g/dL — AB (ref 3.5–5.0)
ALT: 13 U/L — ABNORMAL LOW (ref 14–54)
AST: 57 U/L — ABNORMAL HIGH (ref 15–41)
Alkaline Phosphatase: 585 U/L — ABNORMAL HIGH (ref 38–126)
Anion gap: 4 — ABNORMAL LOW (ref 5–15)
BUN: <5 mg/dL — ABNORMAL LOW (ref 6–20)
CO2: 30 mmol/L (ref 22–32)
CREATININE: 0.82 mg/dL (ref 0.44–1.00)
Calcium: 8 mg/dL — ABNORMAL LOW (ref 8.9–10.3)
Chloride: 110 mmol/L (ref 101–111)
GFR calc non Af Amer: >60 mL/min (ref >60)
Glucose, Bld: 93 mg/dL (ref 65–99)
Potassium: 3.3 mmol/L — ABNORMAL LOW (ref 3.5–5.1)
Sodium: 144 mmol/L (ref 135–145)
Total Bilirubin: 1 mg/dL (ref 0.3–1.2)
Total Protein: 7.1 g/dL (ref 6.5–8.1)

## 2014-07-27 LAB — GLUCOSE, CAPILLARY
GLUCOSE-CAPILLARY: 119 mg/dL — AB (ref 65–99)
Glucose-Capillary: 118 mg/dL — ABNORMAL HIGH (ref 65–99)
Glucose-Capillary: 176 mg/dL — ABNORMAL HIGH (ref 65–99)
Glucose-Capillary: 125 mg/dL — ABNORMAL HIGH (ref 65–99)

## 2014-07-27 LAB — CBC
HCT: 37.1 % (ref 36.0–46.0)
Hemoglobin: 10.9 g/dL — ABNORMAL LOW (ref 12.0–15.0)
MCH: 27.7 pg (ref 26.0–34.0)
MCHC: 29.4 g/dL — ABNORMAL LOW (ref 30.0–36.0)
MCV: 94.2 fL (ref 78.0–100.0)
Platelets: 348 10*3/uL (ref 150–400)
RBC: 3.94 MIL/uL (ref 3.87–5.11)
RDW: 14.3 % (ref 11.5–15.5)
WBC: 11.2 10*3/uL — AB (ref 4.0–10.5)

## 2014-07-27 NOTE — Progress Notes (Signed)
Physical Therapy Treatment Patient Details Name: Yesenia Lyons MRN: [id number] DOB: [birth year] Today's Date: 07/27/2014    History of Present Illness 64 yo female admitted with cholangitis. Hx of obesity, HTN, DM, CHF, metastatic carcinoid tumor. Recent admitssion for cholecystostomy, DC 06/11/14.     PT Comments    Pt was OOB in recliner.  Decreased activity due to recent ABD procedure.  Only tolerated amb from recliner to BR then from BR back to recliner a total of 24 feet with RW.  Slow.  Required increased time.  Follow Up Recommendations  Home health PT     Equipment Recommendations  None recommended by PT    Recommendations for Other Services       Precautions / Restrictions Precautions Precautions: Fall Restrictions Weight Bearing Restrictions: No    Mobility  Bed Mobility               General bed mobility comments: NT-pt up at EOB  Transfers Overall transfer level: Needs assistance Equipment used: Rolling walker (2 wheeled) Transfers: Sit to/from Stand Sit to Stand: Min guard;From elevated surface         General transfer comment: increased time and forward momentum to rise from recliner and commode  Ambulation/Gait Ambulation/Gait assistance: Min guard Ambulation Distance (Feet): 24 Feet (12 feet x 2 to and from bathroom) Assistive device: Rolling walker (2 wheeled)   Gait velocity: slow   General Gait Details: distance limited by pain level and fatigue   Stairs            Wheelchair Mobility    Modified Rankin (Stroke Patients Only)       Balance                                    Cognition Arousal/Alertness: Awake/alert Behavior During Therapy: WFL for tasks assessed/performed Overall Cognitive Status: Within Functional Limits for tasks assessed                      Exercises      General Comments        Pertinent Vitals/Pain Pain Assessment: Faces Faces Pain Scale: Hurts little more Pain  Location: ABD Pain Descriptors / Indicators: Sore;Tightness;Cramping Pain Intervention(s): Monitored during session;Repositioned    Home Living                      Prior Function            PT Goals (current goals can now be found in the care plan section) Progress towards PT goals: Progressing toward goals    Frequency  Min 3X/week    PT Plan      Co-evaluation             End of Session Equipment Utilized During Treatment: Gait belt Activity Tolerance: Patient limited by fatigue;No increased pain Patient left: in chair;with call bell/phone within reach     Time: 1340-1405 PT Time Calculation (min) (ACUTE ONLY): 25 min  Charges:  $Gait Training: 8-22 mins $Therapeutic Activity: 8-22 mins                    G Codes:      Rica Koyanagi  PTA [facility]  Acute  Rehab Pager      [phone]

## 2014-07-27 NOTE — Progress Notes (Signed)
1 Day Post-Op  Subjective: She seems comfortable.  Sites all look fine she has ordered some clears for this AM.  Drain is clear.  Objective: Vital signs in last 24 hours: Temp:  [97.5 F (36.4 C)-98.1 F (36.7 C)] 97.6 F (36.4 C) (07/20 0555) Pulse Rate:  [47-82] 57 (07/20 0555) Resp:  [15-27] 18 (07/20 0555) BP: (128-165)/(57-87) 138/79 mmHg (07/20 0555) SpO2:  [91 %-100 %] 100 % (07/20 0555) Last BM Date: 07/25/14 Nothing po recorded yesterday Diet: clears Afebrile, VSS K+ 3.3,  Alk Phos still up 585 WBC 11.2 Intake/Output from previous day: 07/19 0701 - 07/20 0700 In: 2535 [I.V.:2535] Out: 1520 [Urine:1250; Drains:270] Intake/Output this shift:    General appearance: alert, cooperative and no distress GI: soft sore, drain is clear with some clotted blood in it.  Lab Results:   Recent Labs  07/25/14 0844 07/27/14 0345  WBC 7.8 11.2*  HGB 10.8* 10.9*  HCT 35.9* 37.1  PLT 291 348    BMET  Recent Labs  07/26/14 0420 07/27/14 0345  NA 141 144  K 3.0* 3.3*  CL 108 110  CO2 28 30  GLUCOSE 117* 93  BUN 5* <5*  CREATININE 0.92 0.82  CALCIUM 8.0* 8.0*   PT/INR  Recent Labs  07/25/14 0844  LABPROT 15.7*  INR 1.23     Recent Labs Lab 07/20/14 2103 07/21/14 0459 07/22/14 0500 07/25/14 0844 07/27/14 0345  AST 110* 54* 28 22 57*  ALT '25 21 15 ' 13* 13*  ALKPHOS 398* 330* 288* 556* 585*  BILITOT 2.7* 2.1* 1.7* 1.1 1.0  PROT 8.6* 7.6 7.1 7.4 7.1  ALBUMIN 3.2* 2.7* 2.4* 2.3* 2.3*     Lipase     Component Value Date/Time   LIPASE <10* 07/20/2014 2103     Studies/Results: Dg Chest 2 View  07/25/2014   CLINICAL DATA:  Sepsis.  EXAM: CHEST  2 VIEW  COMPARISON:  Jun 03, 2014.  FINDINGS: Stable cardiomediastinal silhouette. Hypoinflation of the lungs is noted. No pneumothorax or pleural effusion is noted. Right-sided PICC line is noted with distal tip in the expected position of the SVC. Mild central pulmonary vascular congestion is noted. Probable  minimal bibasilar subsegmental atelectasis. Bony thorax is intact.  IMPRESSION: Hypoinflation of the lungs. Mild central pulmonary vascular congestion. Probable minimal bibasilar subsegmental atelectasis.   Electronically Signed   By: Marijo Conception, M.D.   On: 07/25/2014 10:40    Medications: . feeding supplement (ENSURE ENLIVE)  237 mL Oral BID BM  . furosemide  40 mg Oral Daily  . gabapentin  400 mg Oral TID  . heparin subcutaneous  5,000 Units Subcutaneous 3 times per day  . imipenem-cilastatin  500 mg Intravenous Q6H  . insulin aspart  0-9 Units Subcutaneous TID WC & HS  . insulin glargine  10 Units Subcutaneous BID  . saccharomyces boulardii  250 mg Oral BID  . sodium chloride  3 mL Intravenous Q12H  . spironolactone  25 mg Oral Daily    Assessment/Plan Sepsis/cholelithiasis and cholecystitis, status post percutaneous cholecystostomy 06/09/14 Laparoscopic cholecystectomy, drain placement; 07/26/14, Dr. Excell Seltzer Drain study 07/18/14 with patent cystic duct and CBD, ongoing filling defects. Metastatic carcinoid with tumor progression in the liver Encephalopathy - Improved Hypertension AODM Acute kidney injury Hx Chronic diastolic CHF Body mass index is 44.35  Antibiotics: 7 days of antibiotics this admit; Currently Day 5 Vancomycin - discontinued on 7/19/day 7 of Imipenem-cilastatin DVT: Heparin/SCD   Plan:  Continue antibiotic for  Couple  days, advance diet and mobilize.  Advance her diet as tolerated to low fat.  We will take surgical drain out before discharge.    LOS: 6 days    Yesenia Lyons 07/27/2014

## 2014-07-27 NOTE — Progress Notes (Signed)
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 Burr Lyons), diabetes mellitus who was hospitalized one month back for sepsis due to acute cholecystitis and had percutaneous cholecystostomy tube placed presented to the ED with sudden onset of right upper quadrant abdominal pain after she had a drain study done on 7/11. This is associated with nausea and was febrile in the ED with mildly elevated LFTs. She also has significant leukocytosis and elevated lactic acid meeting criteria for sepsis. A CT scan of the abdomen and pelvis showing progression of hepatic and metastatic disease.  Assessment/Plan: Sepsis on admission Resolved since admission. Possibly related to ascending cholangitis versus progressive liver metastases. Remains on Primaxin for broad coverage. Cholecystostomy drain culture growing coag-negative staph. Had been on empiric vancomycin. Blood cultures negative.  -Laparoscopic cholecystectomy on 7/19 - ID recs to cont imipenem with plans to transition to PO bactrim on d/c  Type 2 diabetes mellitus On sliding scale insulin . continued home dose Lantus.  Metastatic carcinoid tumor Follows with Dr. Burr Lyons.  CT on admission shows progressive liver and nodal metastases. Pt on octreotide as outpatient. Pt has been seen by Dr Yesenia Lyons with plans for liver targeted therapy by IR once acute illness resolves.  Acute kidney injury Secondary to sepsis.  Improved with IV hydration.  Continue on Lasix.  Hypokalemia Replenish kcl .  Resumed Aldactone.  Diarrhea  Stool for C. difficile negative.  DVT prophylaxis: Subcutaneous heparin while inpatient  Code Status: Full Family Communication: Pt in room  Disposition Plan: Pending   Consultants:  ID  Oncology  General  Surgery  Procedures:    Antibiotics:  IV cipro and flagyl x 1 dose  IV primaxin 7/14--  Iv vancomycin 7/15--  HPI/Subjective: Patient feels better today  Objective: Filed Vitals:   07/26/14 0500 07/27/14 0230 07/27/14 0555 07/27/14 1400  BP:  136/72 138/79 116/77  Pulse:  56 57 61  Temp:  97.6 F (36.4 C) 97.6 F (36.4 C) 98.4 F (36.9 C)  TempSrc:  Oral Oral Axillary  Resp:  18 18 18   Height:      Weight: 145.9 kg (321 lb 10.4 oz)     SpO2:  91% 100% 97%    Intake/Output Summary (Last 24 hours) at 07/27/14 1656 Last data filed at 07/27/14 1400  Gross per 24 hour  Intake   1993 ml  Output   2940 ml  Net   -947 ml   Filed Weights   07/22/14 0030 07/25/14 0544 07/26/14 0500  Weight: 135.4 kg (298 lb 8.1 oz) 140.2 kg (309 lb 1.4 oz) 145.9 kg (321 lb 10.4 oz)    Exam:   General:  Awake, in nad  Cardiovascular: regular, s1, s2  Respiratory: normal resp effort, no wheezing  Abdomen: soft,nondistended  Musculoskeletal: perfused, no clubbing   Data Reviewed: Basic Metabolic Panel:  Recent Labs Lab 07/21/14 0459 07/22/14 0500 07/25/14 0844 07/25/14 0850 07/26/14 0420 07/27/14 0345  NA 134* 141 143  --  141 144  K 3.6 3.3* 2.7*  --  3.0* 3.3*  CL 99* 108 109  --  108 110  CO2 26 27 27   --  28 30  GLUCOSE 242* 160* 144*  --  117* 93  BUN 16 13 <5*  --  5* <5*  CREATININE 1.16* 1.05* 0.93  --  0.92 0.82  CALCIUM 8.0*  7.8* 8.0*  --  8.0* 8.0*  MG  --   --   --  1.8  --   --    Liver Function Tests:  Recent Labs Lab 07/20/14 2103 07/21/14 0459 07/22/14 0500 07/25/14 0844 07/27/14 0345  AST 110* 54* 28 22 57*  ALT 25 21 15  13* 13*  ALKPHOS 398* 330* 288* 556* 585*  BILITOT 2.7* 2.1* 1.7* 1.1 1.0  PROT 8.6* 7.6 7.1 7.4 7.1  ALBUMIN 3.2* 2.7* 2.4* 2.3* 2.3*    Recent Labs Lab 07/20/14 2103  LIPASE <10*    Recent Labs Lab 07/22/14 0900  AMMONIA 30   CBC:  Recent Labs Lab 07/21/14 0459 07/22/14 0500 07/23/14 0845  07/25/14 0844 07/27/14 0345  WBC 20.3* 19.2* 13.8* 7.8 11.2*  NEUTROABS 15.4*  --   --   --   --   HGB 11.8* 11.1* 10.3* 10.8* 10.9*  HCT 37.4 35.3* 34.0* 35.9* 37.1  MCV 90.1 89.4 92.1 92.8 94.2  PLT 260 226 262 291 348   Cardiac Enzymes: No results for input(s): CKTOTAL, CKMB, CKMBINDEX, TROPONINI in the last 168 hours. BNP (last 3 results) No results for input(s): BNP in the last 8760 hours.  ProBNP (last 3 results) No results for input(s): PROBNP in the last 8760 hours.  CBG:  Recent Labs Lab 07/26/14 1459 07/26/14 1751 07/26/14 2201 07/27/14 0753 07/27/14 1152  GLUCAP 139* 102* 101* 119* 118*    Recent Results (from the past 240 hour(s))  Culture, blood (routine x 2)     Status: None   Collection Time: 07/21/14  4:59 AM  Result Value Ref Range Status   Specimen Description BLOOD RIGHT HAND  Final   Special Requests BOTTLES DRAWN AEROBIC ONLY 2.5ML  Final   Culture   Final    NO GROWTH 5 DAYS Performed at [facility]    Report Status 07/26/2014 FINAL  Final  Culture, blood (routine x 2)     Status: None   Collection Time: 07/21/14  4:59 AM  Result Value Ref Range Status   Specimen Description BLOOD RIGHT ANTECUBITAL  Final   Special Requests BOTTLES DRAWN AEROBIC AND ANAEROBIC 5.5ML  Final   Culture   Final    NO GROWTH 5 DAYS Performed at [facility]    Report Status 07/26/2014 FINAL  Final  Body fluid culture     Status: None   Collection Time: 07/21/14 10:42 AM  Result Value Ref Range Status   Specimen Description COMMON BILE DUCT  Final   Special Requests Immunocompromised  Final   Gram Stain   Final    NO WBC SEEN MODERATE GRAM POSITIVE COCCI IN CLUSTERS    Culture   Final    ABUNDANT STAPHYLOCOCCUS SIMULANS Performed at [facility]    Report Status 07/25/2014 FINAL  Final   Organism ID, Bacteria STAPHYLOCOCCUS SIMULANS  Final      Susceptibility   Staphylococcus simulans - MIC*    CIPROFLOXACIN >=8 RESISTANT  Resistant     ERYTHROMYCIN >=8 RESISTANT Resistant     GENTAMICIN <=0.5 SENSITIVE Sensitive     OXACILLIN <=0.25 SENSITIVE Sensitive     TETRACYCLINE <=1 SENSITIVE Sensitive     VANCOMYCIN <=0.5 SENSITIVE Sensitive     TRIMETH/SULFA <=10 SENSITIVE Sensitive     CLINDAMYCIN <=0.25 RESISTANT Resistant     RIFAMPIN <=0.5 SENSITIVE Sensitive     Inducible Clindamycin POSITIVE Resistant     * ABUNDANT STAPHYLOCOCCUS SIMULANS  Surgical pcr screen  Status: None   Collection Time: 07/25/14  7:58 PM  Result Value Ref Range Status   MRSA, PCR NEGATIVE NEGATIVE Final   Staphylococcus aureus NEGATIVE NEGATIVE Final    Comment:        The Xpert SA Assay (FDA approved for NASAL specimens in patients over 31 years of age), is one component of a comprehensive surveillance program.  Test performance has been validated by [facility] for patients greater than or equal to 42 year old. It is not intended to diagnose infection nor to guide or monitor treatment.   Clostridium Difficile by PCR (not at [facility])     Status: None   Collection Time: 07/26/14  8:51 AM  Result Value Ref Range Status   C difficile by pcr NEGATIVE NEGATIVE Final     Studies: No results found.  Scheduled Meds: . feeding supplement (ENSURE ENLIVE)  237 mL Oral BID BM  . furosemide  40 mg Oral Daily  . gabapentin  400 mg Oral TID  . heparin subcutaneous  5,000 Units Subcutaneous 3 times per day  . imipenem-cilastatin  500 mg Intravenous Q6H  . insulin aspart  0-9 Units Subcutaneous TID WC & HS  . insulin glargine  10 Units Subcutaneous BID  . saccharomyces boulardii  250 mg Oral BID  . sodium chloride  3 mL Intravenous Q12H  . spironolactone  25 mg Oral Daily   Continuous Infusions: . 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 If 7PM-7AM, please contact night-coverage at [email or web address], password [facility] 07/27/2014, 4:56 PM  LOS: 6 days

## 2014-07-28 ENCOUNTER — Inpatient Hospital Stay (HOSPITAL_COMMUNITY): Payer: Medicare Other

## 2014-07-28 LAB — COMPREHENSIVE METABOLIC PANEL
ALT: 11 U/L — AB (ref 14–54)
AST: 30 U/L (ref 15–41)
Albumin: 2.4 g/dL — ABNORMAL LOW (ref 3.5–5.0)
Alkaline Phosphatase: 608 U/L — ABNORMAL HIGH (ref 38–126)
Anion gap: 8 (ref 5–15)
BUN: 5 mg/dL — AB (ref 6–20)
CO2: 26 mmol/L (ref 22–32)
Calcium: 8.1 mg/dL — ABNORMAL LOW (ref 8.9–10.3)
Chloride: 113 mmol/L — ABNORMAL HIGH (ref 101–111)
Creatinine, Ser: 0.95 mg/dL (ref 0.44–1.00)
GFR calc non Af Amer: >60 mL/min (ref >60)
Glucose, Bld: 98 mg/dL (ref 65–99)
Potassium: 3.2 mmol/L — ABNORMAL LOW (ref 3.5–5.1)
SODIUM: 147 mmol/L — AB (ref 135–145)
TOTAL PROTEIN: 7.2 g/dL (ref 6.5–8.1)
Total Bilirubin: 1.2 mg/dL (ref 0.3–1.2)

## 2014-07-28 LAB — CBC
HCT: 36.3 % (ref 36.0–46.0)
Hemoglobin: 11.2 g/dL — ABNORMAL LOW (ref 12.0–15.0)
MCH: 28.3 pg (ref 26.0–34.0)
MCHC: 30.9 g/dL (ref 30.0–36.0)
MCV: 91.7 fL (ref 78.0–100.0)
PLATELETS: 355 10*3/uL (ref 150–400)
RBC: 3.96 MIL/uL (ref 3.87–5.11)
RDW: 14.5 % (ref 11.5–15.5)
WBC: 17 10*3/uL — ABNORMAL HIGH (ref 4.0–10.5)

## 2014-07-28 LAB — GLUCOSE, CAPILLARY
GLUCOSE-CAPILLARY: 131 mg/dL — AB (ref 65–99)
GLUCOSE-CAPILLARY: 200 mg/dL — AB (ref 65–99)
GLUCOSE-CAPILLARY: 203 mg/dL — AB (ref 65–99)
Glucose-Capillary: 95 mg/dL (ref 65–99)

## 2014-07-28 NOTE — Progress Notes (Signed)
2 Days Post-Op  Subjective: In bed taking clears, no nausea or vomiting.  Sites all look fine I changed dressing on her drain site and it looks fine too.  Drainage from JP is clear. Objective: Vital signs in last 24 hours: Temp:  [98.4 F (36.9 C)-98.8 F (37.1 C)] 98.8 F (37.1 C) (07/21 0529) Pulse Rate:  [61-92] 87 (07/21 0529) Resp:  [18] 18 (07/21 0529) BP: (116-142)/(50-91) 127/64 mmHg (07/21 0529) SpO2:  [95 %-98 %] 95 % (07/21 0529) Last BM Date: 07/25/14 PO not recorded  Diet: clears Drain 280 ml 4850 urine output Afebrile, VSS NA 147, K+ 3.2 WBC 17.0 Intake/Output from previous day: 07/20 0701 - 07/21 0700 In: 2403 [I.V.:2403] Out: 5130 [Urine:4850; Drains:280] Intake/Output this shift:    General appearance: alert, cooperative and no distress GI: soft, sore sites OK + BS.  drainage from JP is clear serous.  Lab Results:   Recent Labs  07/27/14 0345 07/28/14 0534  WBC 11.2* 17.0*  HGB 10.9* 11.2*  HCT 37.1 36.3  PLT 348 355    BMET  Recent Labs  07/27/14 0345 07/28/14 0534  NA 144 147*  K 3.3* 3.2*  CL 110 113*  CO2 30 26  GLUCOSE 93 98  BUN <5* 5*  CREATININE 0.82 0.95  CALCIUM 8.0* 8.1*   PT/INR  Recent Labs  07/25/14 0844  LABPROT 15.7*  INR 1.23     Recent Labs Lab 07/22/14 0500 07/25/14 0844 07/27/14 0345 07/28/14 0534  AST 28 22 57* 30  ALT 15 13* 13* 11*  ALKPHOS 288* 556* 585* 608*  BILITOT 1.7* 1.1 1.0 1.2  PROT 7.1 7.4 7.1 7.2  ALBUMIN 2.4* 2.3* 2.3* 2.4*     Lipase     Component Value Date/Time   LIPASE <10* 07/20/2014 2103     Studies/Results: No results found.  Medications: . feeding supplement (ENSURE ENLIVE)  237 mL Oral BID BM  . furosemide  40 mg Oral Daily  . gabapentin  400 mg Oral TID  . heparin subcutaneous  5,000 Units Subcutaneous 3 times per day  . imipenem-cilastatin  500 mg Intravenous Q6H  . insulin aspart  0-9 Units Subcutaneous TID WC & HS  . insulin glargine  10 Units Subcutaneous  BID  . saccharomyces boulardii  250 mg Oral BID  . sodium chloride  3 mL Intravenous Q12H  . spironolactone  25 mg Oral Daily    Assessment/Plan Sepsis/cholelithiasis and cholecystitis, status post percutaneous cholecystostomy 06/09/14 Laparoscopic cholecystectomy, drain placement; 07/26/14, Dr. Excell Seltzer  POD#2 Drain study 07/18/14 with patent cystic duct and CBD, ongoing filling defects. 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  Sites all look fine. She can go to a low fat diet, and I will advance her today.  She is very sedentary, only walks in the house at home. Otherwise in wheelchair. PT is working with her.      LOS: 7 days    Samyiah Halvorsen 07/28/2014

## 2014-07-28 NOTE — Progress Notes (Signed)
Physical Therapy Treatment Patient Details Name: Margeart Allender MRN: [id number] DOB: 1950-02-23 Today's Date: 07/28/2014    History of Present Illness 64 yo female admitted with cholangitis. Hx of obesity, HTN, DM, CHF, metastatic carcinoid tumor. Recent admitssion for cholecystostomy, DC 06/11/14.     PT Comments    Assisted pt OOB to amb a greatert distance in hallway then positioned in recliner.  Pt progressing well.  Follow Up Recommendations  Home health PT     Equipment Recommendations  None recommended by PT    Recommendations for Other Services       Precautions / Restrictions Precautions Precautions: Fall Restrictions Weight Bearing Restrictions: No    Mobility  Bed Mobility Overal bed mobility: Needs Assistance Bed Mobility: Supine to Sit     Supine to sit: Min assist;Mod assist     General bed mobility comments: assist with upper body  Transfers Overall transfer level: Needs assistance Equipment used: Rolling walker (2 wheeled) Transfers: Sit to/from Stand Sit to Stand: Min guard;From elevated surface         General transfer comment: increased time and forward momentum to rise from recliner and commode  Ambulation/Gait Ambulation/Gait assistance: Min guard Ambulation Distance (Feet): 75 Feet Assistive device: Rolling walker (2 wheeled) Gait Pattern/deviations: Step-through pattern     General Gait Details: distance limited by pain level and fatigue   Stairs            Wheelchair Mobility    Modified Rankin (Stroke Patients Only)       Balance                                    Cognition                            Exercises      General Comments        Pertinent Vitals/Pain Pain Assessment: 0-10 Pain Score: 3  Pain Location: ABD Pain Descriptors / Indicators: Sore;Tightness Pain Intervention(s): Monitored during session    Home Living                      Prior Function             PT Goals (current goals can now be found in the care plan section) Progress towards PT goals: Progressing toward goals    Frequency  Min 3X/week    PT Plan      Co-evaluation             End of Session Equipment Utilized During Treatment: Gait belt Activity Tolerance: Patient limited by fatigue Patient left: in chair;with call bell/phone within reach     Time: 1135-1150 PT Time Calculation (min) (ACUTE ONLY): 15 min  Charges:  $Gait Training: 8-22 mins                    G Codes:      Rica Koyanagi  PTA [facility]  Acute  Rehab Pager      [phone]

## 2014-07-28 NOTE — Care Management Important Message (Signed)
Important Message  Patient Details  Name: Yesenia Lyons MRN: [id number] Date of Birth: 08/30/1950   Medicare Important Message Given:  Yes-third notification given    Camillo Flaming 07/28/2014, 11:51 AMImportant Message  Patient Details  Name: Yesenia Lyons MRN: [id number] Date of Birth: 21-Nov-1950   Medicare Important Message Given:  Yes-third notification given    Camillo Flaming 07/28/2014, 11:51 AM

## 2014-07-28 NOTE — Care Management Note (Signed)
Case Management Note  Patient Details  Name: Yesenia Lyons MRN: [id number] Date of Birth: [birth year]  Subjective/Objective:                   PAROSCOPIC CHOLECYSTECTOMY (N/A) Action/Plan:  Discharge planning Expected Discharge Date:  07/29/14              Expected Discharge Plan:  Home/Self Care  In-House Referral:     Discharge planning Services  CM Consult  Post Acute Care Choice:    Choice offered to:     DME Arranged:    DME Agency:     HH Arranged:    HH Agency:     Status of Service:  Completed, signed off  Medicare Important Message Given:  Yes-third notification given Date Medicare IM Given:    Medicare IM give by:    Date Additional Medicare IM Given:    Additional Medicare Important Message give by:     If discussed at [place] of Stay Meetings, dates discussed:    Additional Comments: CM spoke with pt in room to assess for home health needs.  Pt states she lives with her daughter and has rolling walker and 3n1 at home and feels she is weak but able to perform all ADLs indep and declines HH services.  Plan is for pt's JP to be discontinued prior to discharge BUT if pt goes home with drain and needs nursing for maintenance, CM will revisit [facility] agency choice with pt. Dellie Catholic, RN 07/28/2014, 3:26 PM

## 2014-07-28 NOTE — Progress Notes (Signed)
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 Burr Medico), diabetes mellitus who was hospitalized one month back for sepsis due to acute cholecystitis and had percutaneous cholecystostomy tube placed presented to the ED with sudden onset of right upper quadrant abdominal pain after she had a drain study done on 7/11. This is associated with nausea and was febrile in the ED with mildly elevated LFTs. She also has significant leukocytosis and elevated lactic acid meeting criteria for sepsis. A CT scan of the abdomen and pelvis showing progression of hepatic and metastatic disease.  Assessment/Plan: Sepsis on admission Resolved since admission. Possibly related to ascending cholangitis versus progressive liver metastases. Remains on Primaxin for broad coverage. Cholecystostomy drain culture growing coag-negative staph. Had been on empiric vancomycin. Blood cultures negative.  -Pt is now s/p laparoscopic cholecystectomy on 7/19 - ID recs to cont imipenem with plans to transition to PO bactrim on d/c - Advancing diet per Surgery  Type 2 diabetes mellitus On sliding scale insulin . continued home dose Lantus.  Metastatic carcinoid tumor Follows with Dr. Burr Medico.  CT on admission shows progressive liver and nodal metastases. Pt on octreotide as outpatient. Pt has been seen by Dr Burr Medico with plans for liver targeted therapy by IR once acute illness resolves.  Acute kidney injury Secondary to sepsis.  Improved with IV hydration.  Continue on Lasix as tolerated  Hypokalemia Replenish kcl .  Resumed Aldactone.  Diarrhea  Stool for C. difficile negative.  DVT prophylaxis: Cont subcutaneous heparin while inpatient  Code Status: Full Family Communication: Pt in room  Disposition Plan:  Pending   Consultants:  ID  Oncology  General Surgery  Procedures:    Antibiotics:  IV cipro and flagyl x 1 dose  IV primaxin 7/14--  Iv vancomycin 7/15--  HPI/Subjective: Reports feeling better. Tolerating breakfast this AM  Objective: Filed Vitals:   07/27/14 1800 07/27/14 2230 07/28/14 0529 07/28/14 1400  BP: 142/91 124/50 127/64 145/69  Pulse: 75 92 87 89  Temp: 98.4 F (36.9 C) 98.4 F (36.9 C) 98.8 F (37.1 C) 97.8 F (36.6 C)  TempSrc: Oral Oral Oral Oral  Resp: 18 18 18 17   Height:      Weight:      SpO2: 98% 95% 95% 100%    Intake/Output Summary (Last 24 hours) at 07/28/14 1633 Last data filed at 07/28/14 1522  Gross per 24 hour  Intake   3205 ml  Output   7030 ml  Net  -3825 ml   Filed Weights   07/22/14 0030 07/25/14 0544 07/26/14 0500  Weight: 135.4 kg (298 lb 8.1 oz) 140.2 kg (309 lb 1.4 oz) 145.9 kg (321 lb 10.4 oz)    Exam:   General:  Awake, laying in bed, in nad  Cardiovascular: regular, s1, s2  Respiratory: normal resp effort, no wheezing  Abdomen: soft,nondistended, obese  Musculoskeletal: perfused, no clubbing, no cyanosis  Data Reviewed: Basic Metabolic Panel:  Recent Labs Lab 07/22/14 0500 07/25/14 0844 07/25/14 0850 07/26/14 0420 07/27/14 0345 07/28/14 0534  NA 141 143  --  141 144 147*  K 3.3* 2.7*  --  3.0* 3.3* 3.2*  CL 108 109  --  108 110 113*  CO2 27 27  --  28 30 26   GLUCOSE 160* 144*  --  117* 93 98  BUN 13 <5*  --  5* <5* 5*  CREATININE 1.05* 0.93  --  0.92 0.82 0.95  CALCIUM 7.8* 8.0*  --  8.0* 8.0* 8.1*  MG  --   --  1.8  --   --   --    Liver Function Tests:  Recent Labs Lab 07/22/14 0500 07/25/14 0844 07/27/14 0345 07/28/14 0534  AST 28 22 57* 30  ALT 15 13* 13* 11*  ALKPHOS 288* 556* 585* 608*  BILITOT 1.7* 1.1 1.0 1.2  PROT 7.1 7.4 7.1 7.2  ALBUMIN 2.4* 2.3* 2.3* 2.4*   No results for input(s): LIPASE, AMYLASE in the last 168 hours.  Recent Labs Lab 07/22/14 0900  AMMONIA  30   CBC:  Recent Labs Lab 07/22/14 0500 07/23/14 0845 07/25/14 0844 07/27/14 0345 07/28/14 0534  WBC 19.2* 13.8* 7.8 11.2* 17.0*  HGB 11.1* 10.3* 10.8* 10.9* 11.2*  HCT 35.3* 34.0* 35.9* 37.1 36.3  MCV 89.4 92.1 92.8 94.2 91.7  PLT 226 262 291 348 355   Cardiac Enzymes: No results for input(s): CKTOTAL, CKMB, CKMBINDEX, TROPONINI in the last 168 hours. BNP (last 3 results) No results for input(s): BNP in the last 8760 hours.  ProBNP (last 3 results) No results for input(s): PROBNP in the last 8760 hours.  CBG:  Recent Labs Lab 07/27/14 1152 07/27/14 1705 07/27/14 2248 07/28/14 0726 07/28/14 1159  GLUCAP 118* 176* 125* 95 131*    Recent Results (from the past 240 hour(s))  Culture, blood (routine x 2)     Status: None   Collection Time: 07/21/14  4:59 AM  Result Value Ref Range Status   Specimen Description BLOOD RIGHT HAND  Final   Special Requests BOTTLES DRAWN AEROBIC ONLY 2.5ML  Final   Culture   Final    NO GROWTH 5 DAYS Performed at [facility]    Report Status 07/26/2014 FINAL  Final  Culture, blood (routine x 2)     Status: None   Collection Time: 07/21/14  4:59 AM  Result Value Ref Range Status   Specimen Description BLOOD RIGHT ANTECUBITAL  Final   Special Requests BOTTLES DRAWN AEROBIC AND ANAEROBIC 5.5ML  Final   Culture   Final    NO GROWTH 5 DAYS Performed at [facility]    Report Status 07/26/2014 FINAL  Final  Body fluid culture     Status: None   Collection Time: 07/21/14 10:42 AM  Result Value Ref Range Status   Specimen Description COMMON BILE DUCT  Final   Special Requests Immunocompromised  Final   Gram Stain   Final    NO WBC SEEN MODERATE GRAM POSITIVE COCCI IN CLUSTERS    Culture   Final    ABUNDANT STAPHYLOCOCCUS SIMULANS Performed at [facility]    Report Status 07/25/2014 FINAL  Final   Organism ID, Bacteria STAPHYLOCOCCUS SIMULANS  Final      Susceptibility   Staphylococcus simulans - MIC*     CIPROFLOXACIN >=8 RESISTANT Resistant     ERYTHROMYCIN >=8 RESISTANT Resistant     GENTAMICIN <=0.5 SENSITIVE Sensitive     OXACILLIN <=0.25 SENSITIVE Sensitive     TETRACYCLINE <=1 SENSITIVE Sensitive     VANCOMYCIN <=0.5 SENSITIVE Sensitive     TRIMETH/SULFA <=10 SENSITIVE Sensitive     CLINDAMYCIN <=0.25 RESISTANT Resistant     RIFAMPIN <=0.5 SENSITIVE Sensitive     Inducible Clindamycin POSITIVE Resistant     * ABUNDANT STAPHYLOCOCCUS SIMULANS  Surgical pcr screen     Status: None  Collection Time: 07/25/14  7:58 PM  Result Value Ref Range Status   MRSA, PCR NEGATIVE NEGATIVE Final   Staphylococcus aureus NEGATIVE NEGATIVE Final    Comment:        The Xpert SA Assay (FDA approved for NASAL specimens in patients over 21 years of age), is one component of a comprehensive surveillance program.  Test performance has been validated by [facility] for patients greater than or equal to 50 year old. It is not intended to diagnose infection nor to guide or monitor treatment.   Clostridium Difficile by PCR (not at [facility])     Status: None   Collection Time: 07/26/14  8:51 AM  Result Value Ref Range Status   C difficile by pcr NEGATIVE NEGATIVE Final     Studies: Dg Abd 2 Views  07/28/2014   CLINICAL DATA:  64 year old female with metastatic carcinoid tumor. Abdominal pain. Cholecystostomy tube placed earlier, now status post laparoscopic cholecystectomy.  EXAM: ABDOMEN - 2 VIEW  COMPARISON:  07/21/2014 CT Abdomen and Pelvis, and earlier  FINDINGS: Supine and left-side-down lateral decubitus views of the abdomen and pelvis. Cholecystectomy clips. Straight postoperative right abdominal drain in place. No pneumoperitoneum. Bowel gas pattern within normal limits. Stable visualized osseous structures. Patchy opacity at the visible lung bases.  IMPRESSION: 1. Interval cholecystectomy.  Postoperative drain in place. 2.  Normal bowel gas pattern, no free air. 3. Patchy opacity at both lung  bases.   Electronically Signed   By: Genevie Ann M.D.   On: 07/28/2014 12:11    Scheduled Meds: . feeding supplement (ENSURE ENLIVE)  237 mL Oral BID BM  . furosemide  40 mg Oral Daily  . gabapentin  400 mg Oral TID  . heparin subcutaneous  5,000 Units Subcutaneous 3 times per day  . imipenem-cilastatin  500 mg Intravenous Q6H  . insulin aspart  0-9 Units Subcutaneous TID WC & HS  . insulin glargine  10 Units Subcutaneous BID  . saccharomyces boulardii  250 mg Oral BID  . sodium chloride  3 mL Intravenous Q12H  . spironolactone  25 mg Oral Daily   Continuous Infusions: . 0.9 % NaCl with KCl 40 mEq / L 100 mL/hr at 07/28/14 0600    Principal Problem:   Acute cholangitis Active Problems:   Abdominal pain   Diabetes mellitus type 2 in obese   HTN (hypertension)   SIRS (systemic inflammatory response syndrome)   Malignant carcinoid tumor of unknown primary site   Cholecystitis s/p perc drainage w drain 06/09/2014   ARF (acute renal failure)    [facility], [facility] Pager [phone]. If 7PM-7AM, please contact night-coverage at [email or web address], password [facility] 07/28/2014, 4:33 PM  LOS: 7 days

## 2014-07-28 NOTE — Progress Notes (Signed)
ANTIBIOTIC CONSULT NOTE - FOLLOW UP  Pharmacy Consult for primaxin Indication: intra-abdominal infection  Allergies  Allergen Reactions  . Penicillins Itching and Swelling    Patient Measurements: Height: 5\' 10"  (177.8 cm) Weight: (!) 321 lb 10.4 oz (145.9 kg) IBW/kg (Calculated) : 68.5  Vital Signs: Temp: 98.8 F (37.1 C) (07/21 0529) Temp Source: Oral (07/21 0529) BP: 127/64 mmHg (07/21 0529) Pulse Rate: 87 (07/21 0529) Intake/Output from previous day: 07/20 0701 - 07/21 0700 In: 2403 [I.V.:2403] Out: 5130 [Urine:4850; Drains:280] Intake/Output from this shift: Total I/O In: 860 [P.O.:360; I.V.:400; IV Piggyback:100] Out: 890 [Urine:850; Drains:40]  Labs:  Recent Labs  07/26/14 0420 07/27/14 0345 07/28/14 0534  WBC  --  11.2* 17.0*  HGB  --  10.9* 11.2*  PLT  --  348 355  CREATININE 0.92 0.82 0.95   Estimated Creatinine Clearance: 92.7 mL/min (by C-G formula based on Cr of 0.95). No results for input(s): VANCOTROUGH, VANCOPEAK, VANCORANDOM, GENTTROUGH, GENTPEAK, GENTRANDOM, TOBRATROUGH, TOBRAPEAK, TOBRARND, AMIKACINPEAK, AMIKACINTROU, AMIKACIN in the last 72 hours.   Microbiology: Recent Results (from the past 720 hour(s))  Culture, blood (routine x 2)     Status: None   Collection Time: 07/21/14  4:59 AM  Result Value Ref Range Status   Specimen Description BLOOD RIGHT HAND  Final   Special Requests BOTTLES DRAWN AEROBIC ONLY 2.5ML  Final   Culture   Final    NO GROWTH 5 DAYS Performed at [facility]    Report Status 07/26/2014 FINAL  Final  Culture, blood (routine x 2)     Status: None   Collection Time: 07/21/14  4:59 AM  Result Value Ref Range Status   Specimen Description BLOOD RIGHT ANTECUBITAL  Final   Special Requests BOTTLES DRAWN AEROBIC AND ANAEROBIC 5.5ML  Final   Culture   Final    NO GROWTH 5 DAYS Performed at [facility]    Report Status 07/26/2014 FINAL  Final  Body fluid culture     Status: None   Collection Time:  07/21/14 10:42 AM  Result Value Ref Range Status   Specimen Description COMMON BILE DUCT  Final   Special Requests Immunocompromised  Final   Gram Stain   Final    NO WBC SEEN MODERATE GRAM POSITIVE COCCI IN CLUSTERS    Culture   Final    ABUNDANT STAPHYLOCOCCUS SIMULANS Performed at [facility]    Report Status 07/25/2014 FINAL  Final   Organism ID, Bacteria STAPHYLOCOCCUS SIMULANS  Final      Susceptibility   Staphylococcus simulans - MIC*    CIPROFLOXACIN >=8 RESISTANT Resistant     ERYTHROMYCIN >=8 RESISTANT Resistant     GENTAMICIN <=0.5 SENSITIVE Sensitive     OXACILLIN <=0.25 SENSITIVE Sensitive     TETRACYCLINE <=1 SENSITIVE Sensitive     VANCOMYCIN <=0.5 SENSITIVE Sensitive     TRIMETH/SULFA <=10 SENSITIVE Sensitive     CLINDAMYCIN <=0.25 RESISTANT Resistant     RIFAMPIN <=0.5 SENSITIVE Sensitive     Inducible Clindamycin POSITIVE Resistant     * ABUNDANT STAPHYLOCOCCUS SIMULANS  Surgical pcr screen     Status: None   Collection Time: 07/25/14  7:58 PM  Result Value Ref Range Status   MRSA, PCR NEGATIVE NEGATIVE Final   Staphylococcus aureus NEGATIVE NEGATIVE Final    Comment:        The Xpert SA Assay (FDA approved for NASAL specimens in patients over 81 years of age), is one component of a comprehensive  surveillance program.  Test performance has been validated by [facility] for patients greater than or equal to 29 year old. It is not intended to diagnose infection nor to guide or monitor treatment.   Clostridium Difficile by PCR (not at [facility])     Status: None   Collection Time: 07/26/14  8:51 AM  Result Value Ref Range Status   C difficile by pcr NEGATIVE NEGATIVE Final    Anti-infectives    Start     Dose/Rate Route Frequency Ordered Stop   07/25/14 2000  imipenem-cilastatin (PRIMAXIN) 500 mg in sodium chloride 0.9 % 100 mL IVPB     500 mg 200 mL/hr over 30 Minutes Intravenous Every 6 hours 07/25/14 1510     07/25/14 0000  vancomycin  (VANCOCIN) IVPB 750 mg/150 ml premix  Status:  Discontinued     750 mg 150 mL/hr over 60 Minutes Intravenous Every 12 hours 07/24/14 1242 07/26/14 1721   07/23/14 0000  vancomycin (VANCOCIN) IVPB 1000 mg/200 mL premix  Status:  Discontinued     1,000 mg 200 mL/hr over 60 Minutes Intravenous Every 12 hours 07/22/14 1047 07/24/14 1242   07/22/14 1200  vancomycin (VANCOCIN) 2,000 mg in sodium chloride 0.9 % 500 mL IVPB     2,000 mg 250 mL/hr over 120 Minutes Intravenous  Once 07/22/14 1046 07/22/14 1418   07/21/14 1400  imipenem-cilastatin (PRIMAXIN) 500 mg in sodium chloride 0.9 % 100 mL IVPB  Status:  Discontinued     500 mg 200 mL/hr over 30 Minutes Intravenous 3 times per day 07/21/14 0824 07/25/14 1510   07/21/14 1000  ciprofloxacin (CIPRO) IVPB 400 mg  Status:  Discontinued     400 mg 200 mL/hr over 60 Minutes Intravenous Every 12 hours 07/21/14 0622 07/21/14 0803   07/21/14 0830  imipenem-cilastatin (PRIMAXIN) 500 mg in sodium chloride 0.9 % 100 mL IVPB  Status:  Discontinued     500 mg 200 mL/hr over 30 Minutes Intravenous 3 times per day 07/21/14 0822 07/21/14 0824   07/21/14 0800  metroNIDAZOLE (FLAGYL) IVPB 500 mg  Status:  Discontinued     500 mg 100 mL/hr over 60 Minutes Intravenous Every 8 hours 07/21/14 0622 07/21/14 1018   07/20/14 2200  ciprofloxacin (CIPRO) IVPB 400 mg     400 mg 200 mL/hr over 60 Minutes Intravenous  Once 07/20/14 2154 07/21/14 0059   07/20/14 2200  metroNIDAZOLE (FLAGYL) IVPB 500 mg     500 mg 100 mL/hr over 60 Minutes Intravenous  Once 07/20/14 2154 07/21/14 0414      Assessment: Patient's a 64 y.o F s/p placement of cholecystostomy tube in early June 2016 who was admitted on 7/13 with worsening RUQ abdominal pain and low grade fever.  She's currently on primaxin day #8 with common bile duct fluid culture now positive for for staph simulans.   ID recommends to continue with primaxin for now and to transition to bactrim at discharge.  Afeb, wbc up  17, scr stable (crcl~93)  7/13 >> Cipro >> 7/14 7/14 >> Flagyl >> 7/14 7/14 >> Primaxin >>  7/15 >> Vanc >>7/19  7/14 blood x2 : neg FINAL 7/14 fluid from common bile duct: IP; gram stain showed moderate GPC in clusters-> MS-Coag neg Staph 7/19 cdiff: neg   Plan:  - continue primaxin 500mg  IV q6h  Yesenia Lyons P 07/28/2014,12:33 PM

## 2014-07-29 LAB — COMPREHENSIVE METABOLIC PANEL
ALK PHOS: 561 U/L — AB (ref 38–126)
ALT: 12 U/L — ABNORMAL LOW (ref 14–54)
ANION GAP: 5 (ref 5–15)
AST: 24 U/L (ref 15–41)
Albumin: 2.3 g/dL — ABNORMAL LOW (ref 3.5–5.0)
BILIRUBIN TOTAL: 0.9 mg/dL (ref 0.3–1.2)
BUN: 5 mg/dL — AB (ref 6–20)
CHLORIDE: 113 mmol/L — AB (ref 101–111)
CO2: 31 mmol/L (ref 22–32)
CREATININE: 1 mg/dL (ref 0.44–1.00)
Calcium: 8 mg/dL — ABNORMAL LOW (ref 8.9–10.3)
GFR calc Af Amer: >60 mL/min (ref >60)
GFR, EST NON AFRICAN AMERICAN: 58 mL/min — AB (ref >60)
GLUCOSE: 165 mg/dL — AB (ref 65–99)
Potassium: 3.6 mmol/L (ref 3.5–5.1)
Sodium: 149 mmol/L — ABNORMAL HIGH (ref 135–145)
TOTAL PROTEIN: 7.4 g/dL (ref 6.5–8.1)

## 2014-07-29 LAB — CBC
HCT: 35.8 % — ABNORMAL LOW (ref 36.0–46.0)
Hemoglobin: 10.8 g/dL — ABNORMAL LOW (ref 12.0–15.0)
MCH: 28.3 pg (ref 26.0–34.0)
MCHC: 30.2 g/dL (ref 30.0–36.0)
MCV: 93.7 fL (ref 78.0–100.0)
Platelets: 412 10*3/uL — ABNORMAL HIGH (ref 150–400)
RBC: 3.82 MIL/uL — AB (ref 3.87–5.11)
RDW: 15.1 % (ref 11.5–15.5)
WBC: 15.7 10*3/uL — ABNORMAL HIGH (ref 4.0–10.5)

## 2014-07-29 LAB — GLUCOSE, CAPILLARY
GLUCOSE-CAPILLARY: 186 mg/dL — AB (ref 65–99)
GLUCOSE-CAPILLARY: 229 mg/dL — AB (ref 65–99)
GLUCOSE-CAPILLARY: 176 mg/dL — AB (ref 65–99)
Glucose-Capillary: 187 mg/dL — ABNORMAL HIGH (ref 65–99)

## 2014-07-29 MED ORDER — INSULIN ASPART 100 UNIT/ML ~~LOC~~ SOLN
3.0000 [IU] | Freq: Three times a day (TID) | SUBCUTANEOUS | Status: DC
Start: 2014-07-29 — End: 2014-08-01
  Administered 2014-07-29 – 2014-08-01 (×8): 3 [IU] via SUBCUTANEOUS

## 2014-07-29 MED ORDER — INSULIN ASPART 100 UNIT/ML ~~LOC~~ SOLN
0.0000 [IU] | Freq: Three times a day (TID) | SUBCUTANEOUS | Status: DC
  Administered 2014-07-29: 3 [IU] via SUBCUTANEOUS
  Administered 2014-07-30: 2 [IU] via SUBCUTANEOUS
  Administered 2014-07-30: 3 [IU] via SUBCUTANEOUS
  Administered 2014-07-31: 2 [IU] via SUBCUTANEOUS
  Administered 2014-07-31: 3 [IU] via SUBCUTANEOUS
  Administered 2014-08-01: 5 [IU] via SUBCUTANEOUS
  Administered 2014-08-01: 2 [IU] via SUBCUTANEOUS

## 2014-07-29 MED ORDER — ENSURE ENLIVE PO LIQD
237.0000 mL | Freq: Three times a day (TID) | ORAL | Status: DC
  Administered 2014-07-30 – 2014-08-01 (×8): 237 mL via ORAL

## 2014-07-29 MED ORDER — INSULIN ASPART 100 UNIT/ML ~~LOC~~ SOLN: 0.0000 [IU] | Freq: Every day | SUBCUTANEOUS | Status: DC

## 2014-07-29 MED ORDER — SODIUM CHLORIDE 0.45 % IV SOLN
INTRAVENOUS | Status: DC
  Administered 2014-07-29 – 2014-07-30 (×2): via INTRAVENOUS
  Filled 2014-07-29 (×4): qty 1000

## 2014-07-29 NOTE — Progress Notes (Signed)
3 Days Post-Op  Subjective: Remains sedentary, on a soft diet, but doesn't sound like she is taking much in.  Her port sites and old drain site look fine.  The drainage from the JP is still cloudy. She is using IS about 3 times per day according to her report.  Objective: Vital signs in last 24 hours: Temp:  [97.8 F (36.6 C)-98.7 F (37.1 C)] 98.7 F (37.1 C) (07/22 0600) Pulse Rate:  [86-97] 86 (07/22 0600) Resp:  [17-20] 20 (07/22 0600) BP: (120-145)/(67-79) 128/67 mmHg (07/22 0600) SpO2:  [96 %-100 %] 97 % (07/22 0600) Weight:  [142.7 kg (314 lb 9.5 oz)] 142.7 kg (314 lb 9.5 oz) (07/22 0500) Last BM Date: 07/25/14 1080 Po  Heart healty +BM x 1 Drain 185 Afebrile, VSS NA 149 WBC slowly improving 15.7 Film: 1. Interval cholecystectomy. Postoperative drain in place. 2. Normal bowel gas pattern, no free air. 3. Patchy opacity at both lung bases.  Intake/Output from previous day: 08/16/22 0701 - 07/22 0700 In: 3880 [P.O.:1080; I.V.:2400; IV Piggyback:400] Out: 8035 [Urine:7850; Drains:185] Intake/Output this shift:    General appearance: alert, cooperative and no distress GI: soft, still sore, sites look fine, + BS, + BM  Lab Results:   Recent Labs  2014-08-16 0534 07/29/14 0423  WBC 17.0* 15.7*  HGB 11.2* 10.8*  HCT 36.3 35.8*  PLT 355 412*    BMET  Recent Labs  08-16-2014 0534 07/29/14 0423  NA 147* 149*  K 3.2* 3.6  CL 113* 113*  CO2 26 31  GLUCOSE 98 165*  BUN 5* 5*  CREATININE 0.95 1.00  CALCIUM 8.1* 8.0*   PT/INR No results for input(s): LABPROT, INR in the last 72 hours.   Recent Labs Lab 07/25/14 0844 07/27/14 0345 08/16/14 0534 07/29/14 0423  AST 22 57* 30 24  ALT 13* 13* 11* 12*  ALKPHOS 556* 585* 608* 561*  BILITOT 1.1 1.0 1.2 0.9  PROT 7.4 7.1 7.2 7.4  ALBUMIN 2.3* 2.3* 2.4* 2.3*     Lipase     Component Value Date/Time   LIPASE <10* 07/20/2014 2103     Studies/Results: Dg Abd 2 Views  August 16, 2014   CLINICAL DATA:   64 year old female with metastatic carcinoid tumor. Abdominal pain. Cholecystostomy tube placed earlier, now status post laparoscopic cholecystectomy.  EXAM: ABDOMEN - 2 VIEW  COMPARISON:  07/21/2014 CT Abdomen and Pelvis, and earlier  FINDINGS: Supine and left-side-down lateral decubitus views of the abdomen and pelvis. Cholecystectomy clips. Straight postoperative right abdominal drain in place. No pneumoperitoneum. Bowel gas pattern within normal limits. Stable visualized osseous structures. Patchy opacity at the visible lung bases.  IMPRESSION: 1. Interval cholecystectomy.  Postoperative drain in place. 2.  Normal bowel gas pattern, no free air. 3. Patchy opacity at both lung bases.   Electronically Signed   By: Genevie Ann M.D.   On: 08-16-2014 12:11    Medications: . feeding supplement (ENSURE ENLIVE)  237 mL Oral BID BM  . furosemide  40 mg Oral Daily  . gabapentin  400 mg Oral TID  . heparin subcutaneous  5,000 Units Subcutaneous 3 times per day  . imipenem-cilastatin  500 mg Intravenous Q6H  . insulin aspart  0-9 Units Subcutaneous TID WC & HS  . insulin glargine  10 Units Subcutaneous BID  . saccharomyces boulardii  250 mg Oral BID  . sodium chloride  3 mL Intravenous Q12H  . spironolactone  25 mg Oral Daily    Assessment/Plan Sepsis/cholelithiasis and cholecystitis, status  post percutaneous cholecystostomy 06/09/14 Laparoscopic cholecystectomy, drain placement; 07/26/14, Dr. Excell Seltzer POD#2 Drain study 07/18/14 with patent cystic duct and CBD, ongoing filling defects. 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  LOS: 8 days    Radhika Dershem 07/29/2014

## 2014-07-29 NOTE — Care Management Note (Signed)
Case Management Note  Patient Details  Name: Yesenia Lyons MRN: [id number] Date of Birth: 25-Mar-1950  Subjective/Objective:    64 y.o. F who under went Paroscopic Cholecystectomy 07/26/2014. Continues to receive IV Abx as well as IVF. Diet advanced to soft. Will continue to follow for discharge needs.                 Action/Plan:   Expected Discharge Date:                  Expected Discharge Plan:  Home/Self Care  In-House Referral:     Discharge planning Services  CM Consult  Post Acute Care Choice:    Choice offered to:     DME Arranged:    DME Agency:     HH Arranged:    HH Agency:     Status of Service:  In process, will continue to follow  Medicare Important Message Given:  Yes-third notification given Date Medicare IM Given:    Medicare IM give by:    Date Additional Medicare IM Given:    Additional Medicare Important Message give by:     If discussed at [place] of Stay Meetings, dates discussed:    Additional Comments:  Delrae Sawyers, RN 07/29/2014, 2:35 PM

## 2014-07-29 NOTE — Progress Notes (Signed)
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 Burr Medico), diabetes mellitus who was hospitalized one month back for sepsis due to acute cholecystitis and had percutaneous cholecystostomy tube placed presented to the ED with sudden onset of right upper quadrant abdominal pain after she had a drain study done on 7/11. This is associated with nausea and was febrile in the ED with mildly elevated LFTs. She also has significant leukocytosis and elevated lactic acid meeting criteria for sepsis. A CT scan of the abdomen and pelvis showing progression of hepatic and metastatic disease.  Assessment/Plan: Sepsis on admission Resolved since admission. Possibly related to ascending cholangitis versus progressive liver metastases. Remains on Primaxin for broad coverage. Cholecystostomy drain culture growing coag-negative staph. Had been on empiric vancomycin. Blood cultures negative.  -Pt is now s/p laparoscopic cholecystectomy on 7/19 - ID recs to cont imipenem with plans to later transition to PO bactrim on d/c - Advancing diet to regular per Surgery  Type 2 diabetes mellitus On sliding scale insulin . continued home dose Lantus. Change regular diet to carb mod Change SSI coverage to "average" dosing  Metastatic carcinoid tumor Follows with Dr. Burr Medico.  CT on admission shows progressive liver and nodal metastases. Pt on octreotide as outpatient. Pt has been seen by Dr Burr Medico with plans for liver targeted therapy by IR once acute illness resolves.  Acute kidney injury Secondary to sepsis.  Improved with IV hydration.  Continue on Lasix as tolerated  Hypokalemia Replenish kcl .  Resumed Aldactone.  Diarrhea  Stool for C. difficile negative.  DVT prophylaxis: Cont subcutaneous heparin while inpatient  Code  Status: Full Family Communication: Pt in room  Disposition Plan: Pending   Consultants:  ID  Oncology  General Surgery  Procedures:    Antibiotics:  IV cipro and flagyl x 1 dose  IV primaxin 7/14--  Iv vancomycin 7/15--7/19  HPI/Subjective: Feels better today. Tolerating diet this AM  Objective: Filed Vitals:   07/28/14 2200 07/29/14 0500 07/29/14 0600 07/29/14 1400  BP: 120/79  128/67 138/65  Pulse: 97  86 98  Temp: 98.7 F (37.1 C)  98.7 F (37.1 C) 97.3 F (36.3 C)  TempSrc: Oral  Oral Oral  Resp: 20  20 18   Height:      Weight:  142.7 kg (314 lb 9.5 oz)    SpO2: 96%  97% 98%    Intake/Output Summary (Last 24 hours) at 07/29/14 1628 Last data filed at 07/29/14 1601  Gross per 24 hour  Intake 3553.33 ml  Output   6085 ml  Net -2531.67 ml   Filed Weights   07/25/14 0544 07/26/14 0500 07/29/14 0500  Weight: 140.2 kg (309 lb 1.4 oz) 145.9 kg (321 lb 10.4 oz) 142.7 kg (314 lb 9.5 oz)    Exam:   General:  Awake, laying in bed, in nad  Cardiovascular: regular, s1, s2  Respiratory: normal resp effort, no wheezing  Abdomen: soft,nondistended, obese, pos BS  Musculoskeletal: perfused, no cyanosis  Data Reviewed: Basic Metabolic Panel:  Recent Labs Lab 07/25/14 0844 07/25/14 0850 07/26/14 0420 07/27/14 0345 07/28/14 0534 07/29/14 0423  NA 143  --  141 144 147* 149*  K 2.7*  --  3.0* 3.3* 3.2* 3.6  CL 109  --  108 110 113* 113*  CO2 27  --  28 30 26 31   GLUCOSE  144*  --  117* 93 98 165*  BUN <5*  --  5* <5* 5* 5*  CREATININE 0.93  --  0.92 0.82 0.95 1.00  CALCIUM 8.0*  --  8.0* 8.0* 8.1* 8.0*  MG  --  1.8  --   --   --   --    Liver Function Tests:  Recent Labs Lab 07/25/14 0844 07/27/14 0345 07/28/14 0534 07/29/14 0423  AST 22 57* 30 24  ALT 13* 13* 11* 12*  ALKPHOS 556* 585* 608* 561*  BILITOT 1.1 1.0 1.2 0.9  PROT 7.4 7.1 7.2 7.4  ALBUMIN 2.3* 2.3* 2.4* 2.3*   No results for input(s): LIPASE, AMYLASE in the last 168  hours. No results for input(s): AMMONIA in the last 168 hours. CBC:  Recent Labs Lab 07/23/14 0845 07/25/14 0844 07/27/14 0345 07/28/14 0534 07/29/14 0423  WBC 13.8* 7.8 11.2* 17.0* 15.7*  HGB 10.3* 10.8* 10.9* 11.2* 10.8*  HCT 34.0* 35.9* 37.1 36.3 35.8*  MCV 92.1 92.8 94.2 91.7 93.7  PLT 262 291 348 355 412*   Cardiac Enzymes: No results for input(s): CKTOTAL, CKMB, CKMBINDEX, TROPONINI in the last 168 hours. BNP (last 3 results) No results for input(s): BNP in the last 8760 hours.  ProBNP (last 3 results) No results for input(s): PROBNP in the last 8760 hours.  CBG:  Recent Labs Lab 07/28/14 1159 07/28/14 1700 07/28/14 2223 07/29/14 0744 07/29/14 1212  GLUCAP 131* 200* 203* 186* 229*    Recent Results (from the past 240 hour(s))  Culture, blood (routine x 2)     Status: None   Collection Time: 07/21/14  4:59 AM  Result Value Ref Range Status   Specimen Description BLOOD RIGHT HAND  Final   Special Requests BOTTLES DRAWN AEROBIC ONLY 2.5ML  Final   Culture   Final    NO GROWTH 5 DAYS Performed at [facility]    Report Status 07/26/2014 FINAL  Final  Culture, blood (routine x 2)     Status: None   Collection Time: 07/21/14  4:59 AM  Result Value Ref Range Status   Specimen Description BLOOD RIGHT ANTECUBITAL  Final   Special Requests BOTTLES DRAWN AEROBIC AND ANAEROBIC 5.5ML  Final   Culture   Final    NO GROWTH 5 DAYS Performed at [facility]    Report Status 07/26/2014 FINAL  Final  Body fluid culture     Status: None   Collection Time: 07/21/14 10:42 AM  Result Value Ref Range Status   Specimen Description COMMON BILE DUCT  Final   Special Requests Immunocompromised  Final   Gram Stain   Final    NO WBC SEEN MODERATE GRAM POSITIVE COCCI IN CLUSTERS    Culture   Final    ABUNDANT STAPHYLOCOCCUS SIMULANS Performed at [facility]    Report Status 07/25/2014 FINAL  Final   Organism ID, Bacteria STAPHYLOCOCCUS SIMULANS   Final      Susceptibility   Staphylococcus simulans - MIC*    CIPROFLOXACIN >=8 RESISTANT Resistant     ERYTHROMYCIN >=8 RESISTANT Resistant     GENTAMICIN <=0.5 SENSITIVE Sensitive     OXACILLIN <=0.25 SENSITIVE Sensitive     TETRACYCLINE <=1 SENSITIVE Sensitive     VANCOMYCIN <=0.5 SENSITIVE Sensitive     TRIMETH/SULFA <=10 SENSITIVE Sensitive     CLINDAMYCIN <=0.25 RESISTANT Resistant     RIFAMPIN <=0.5 SENSITIVE Sensitive     Inducible Clindamycin POSITIVE Resistant     *  ABUNDANT STAPHYLOCOCCUS SIMULANS  Surgical pcr screen     Status: None   Collection Time: 07/25/14  7:58 PM  Result Value Ref Range Status   MRSA, PCR NEGATIVE NEGATIVE Final   Staphylococcus aureus NEGATIVE NEGATIVE Final    Comment:        The Xpert SA Assay (FDA approved for NASAL specimens in patients over 3 years of age), is one component of a comprehensive surveillance program.  Test performance has been validated by [facility] for patients greater than or equal to 29 year old. It is not intended to diagnose infection nor to guide or monitor treatment.   Clostridium Difficile by PCR (not at [facility])     Status: None   Collection Time: 07/26/14  8:51 AM  Result Value Ref Range Status   C difficile by pcr NEGATIVE NEGATIVE Final     Studies: Dg Abd 2 Views  07/28/2014   CLINICAL DATA:  64 year old female with metastatic carcinoid tumor. Abdominal pain. Cholecystostomy tube placed earlier, now status post laparoscopic cholecystectomy.  EXAM: ABDOMEN - 2 VIEW  COMPARISON:  07/21/2014 CT Abdomen and Pelvis, and earlier  FINDINGS: Supine and left-side-down lateral decubitus views of the abdomen and pelvis. Cholecystectomy clips. Straight postoperative right abdominal drain in place. No pneumoperitoneum. Bowel gas pattern within normal limits. Stable visualized osseous structures. Patchy opacity at the visible lung bases.  IMPRESSION: 1. Interval cholecystectomy.  Postoperative drain in place. 2.  Normal  bowel gas pattern, no free air. 3. Patchy opacity at both lung bases.   Electronically Signed   By: Genevie Ann M.D.   On: 07/28/2014 12:11    Scheduled Meds: . feeding supplement (ENSURE ENLIVE)  237 mL Oral TID BM  . furosemide  40 mg Oral Daily  . gabapentin  400 mg Oral TID  . heparin subcutaneous  5,000 Units Subcutaneous 3 times per day  . imipenem-cilastatin  500 mg Intravenous Q6H  . insulin aspart  0-9 Units Subcutaneous TID WC & HS  . insulin glargine  10 Units Subcutaneous BID  . saccharomyces boulardii  250 mg Oral BID  . sodium chloride  3 mL Intravenous Q12H  . spironolactone  25 mg Oral Daily   Continuous Infusions: . sodium chloride 0.45 % with kcl 100 mL/hr at 07/29/14 1216    Principal Problem:   Acute cholangitis Active Problems:   Abdominal pain   Diabetes mellitus type 2 in obese   HTN (hypertension)   SIRS (systemic inflammatory response syndrome)   Malignant carcinoid tumor of unknown primary site   Cholecystitis s/p perc drainage w drain 06/09/2014   ARF (acute renal failure)    [facility], [place] [facility] Pager [phone]. If 7PM-7AM, please contact night-coverage at [email or web address], password [facility] 07/29/2014, 4:28 PM  LOS: 8 days

## 2014-07-29 NOTE — Progress Notes (Signed)
Nutrition Follow-up  DOCUMENTATION CODES:   Severe malnutrition in context of acute illness/injury, Morbid obesity  INTERVENTION:   Increase Ensure Enlive po to TID, each supplement provides 350 kcal and 20 grams of protein  Magic cup TID with meals, each supplement provides 290 kcal and 9 grams of protein  NUTRITION DIAGNOSIS:   Malnutrition related to acute illness as evidenced by percent weight loss, energy intake < or equal to 50% for > or equal to 5 days.  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 Burr Medico), diabetes mellitus who was hospitalized one month back for sepsis due to acute cholecystitis and had percutaneous cholecystostomy tube placed presented to the ED with sudden onset of right upper quadrant abdominal pain after she had a drain study done on 7/11. This is associated with nausea and was febrile in the ED with mildly elevated LFTs.  Labs reviewed: sodium, alkaline Phosphatase, and WBC's elevated Cbg's: 186-229 Medications reviewed and include: lantus, novolog, spironolactone  Pt s/p laparoscopic cholecystectomy 7/19. JP drain in RLQ 185 ml out 7/21. During pt visit pt was on the phone, she decided to let the person on the line hold while we spoke.  She reports not eating much due to poor appetite. Pt likes the ensure but is only drinking one per day. Encouraged pt to drink more since she is eating little. Discussed strategies for poor appetite.  Pt is also willing to try magic cup until she is eating better. Discussed with pt importance of adequate nutrition for recovery.   Diet Order:  Diet regular Room service appropriate?: Yes; Fluid consistency:: Thin  Skin:  Reviewed, no  issues  Last BM:  7/22  Height:   Ht Readings from Last 1 Encounters:  07/21/14 '5\' 10"'  (1.778 m)    Weight:   Wt Readings from Last 1 Encounters:  07/29/14 314 lb 9.5 oz (142.7 kg)    Ideal Body Weight:  68.1 kg  Wt Readings from Last 10 Encounters:  07/29/14 314 lb 9.5 oz (142.7 kg)  06/11/14 324 lb 8.3 oz (147.2 kg)  05/26/14 327 lb 6.4 oz (148.508 kg)  03/03/14 331 lb 1.6 oz (150.186 kg)  01/03/14 344 lb 1.6 oz (156.083 kg)  12/06/13 338 lb 6.4 oz (153.497 kg)  11/05/13 332 lb 8 oz (150.821 kg)  10/07/13 336 lb 12.8 oz (152.771 kg)  09/02/13 333 lb (151.048 kg)  08/28/13 327 lb 9.7 oz (148.6 kg)    BMI:  Body mass index is 45.14 kg/(m^2).  Estimated Nutritional Needs:   Kcal:  2000-2200  Protein:  85-95g  Fluid:  2L/day  EDUCATION NEEDS:   No education needs identified at this time  [place], [place], [place] Pager [phone] After Hours Pager

## 2014-07-29 NOTE — Progress Notes (Signed)
Physical Therapy Treatment Patient Details Name: Yesenia Lyons MRN: [id number] DOB: [birth year] Today's Date: 07/29/2014    History of Present Illness 64 yo female admitted with cholangitis. Hx of obesity, HTN, DM, CHF, metastatic carcinoid tumor. Recent admitssion for cholecystostomy, DC 06/11/14.     PT Comments    Pt feeling better.  Stated she had a bowel mvt yesterday.  Still eating very little.  Assisted OOB to amb a greater distance in hallway.  Assisted to BR then back to bed per pt request.  Pt progressing well.   Follow Up Recommendations  Home health PT     Equipment Recommendations  None recommended by PT    Recommendations for Other Services       Precautions / Restrictions Precautions Precautions: Fall Precaution Comments:  R ABD pain Restrictions Weight Bearing Restrictions: No    Mobility  Bed Mobility Overal bed mobility: Needs Assistance Bed Mobility: Supine to Sit     Supine to sit: Min guard;Supervision     General bed mobility comments: HOB elevated and use of rail other wise able to self perform  Transfers Overall transfer level: Needs assistance Equipment used: Rolling walker (2 wheeled) Transfers: Sit to/from Stand Sit to Stand: Supervision;Min guard         General transfer comment: increased time and forward momentum to rise from recliner and commode  Ambulation/Gait Ambulation/Gait assistance: Supervision;Min guard Ambulation Distance (Feet): 115 Feet Assistive device: Rolling walker (2 wheeled) Gait Pattern/deviations: Step-through pattern Gait velocity: slow but steady    General Gait Details: tolerated increased distance   Stairs            Wheelchair Mobility    Modified Rankin (Stroke Patients Only)       Balance                                    Cognition Arousal/Alertness: Awake/alert Behavior During Therapy: WFL for tasks assessed/performed Overall Cognitive Status: Within Functional Limits  for tasks assessed                      Exercises      General Comments        Pertinent Vitals/Pain Pain Assessment: No/denies pain    Home Living                      Prior Function            PT Goals (current goals can now be found in the care plan section) Progress towards PT goals: Progressing toward goals    Frequency  Min 3X/week    PT Plan      Co-evaluation             End of Session Equipment Utilized During Treatment: Gait belt Activity Tolerance: Patient tolerated treatment well Patient left: in bed;with call bell/phone within reach     Time: 1005-1030 PT Time Calculation (min) (ACUTE ONLY): 25 min  Charges:  $Gait Training: 8-22 mins $Therapeutic Activity: 8-22 mins                    G Codes:      Rica Koyanagi  PTA [facility]  Acute  Rehab Pager      [phone]

## 2014-07-29 NOTE — Progress Notes (Signed)
Inpatient Diabetes Program Recommendations  AACE/ADA: New Consensus Statement on Inpatient Glycemic Control (2013)  Target Ranges:  Prepandial:   less than 140 mg/dL      Peak postprandial:   less than 180 mg/dL (1-2 hours)      Critically ill patients:  140 - 180 mg/dL   Reason for Visit: Post-prandial blood sugars elevated  Results for ROZA, CREAMER (MRN [id number]) as of 07/29/2014 13:06  Ref. Range 07/28/2014 17:00 07/28/2014 22:23 07/29/2014 07:44 07/29/2014 12:12  Glucose-Capillary Latest Ref Range: 65-99 mg/dL 200 (H) 203 (H) 186 (H) 229 (H)   FBS looks good. Post-prandials slightly elevated.  If po intake increases and CBGs consistently >180 mg/dL, consider addition of meal coverage insulin - Novolog 3 units tidwc.  Will continue to follow. Thank you. Lorenda Peck, RD, LDN, CDE Inpatient Diabetes Coordinator [phone]

## 2014-07-30 DIAGNOSIS — N179 Acute kidney failure, unspecified: Secondary | ICD-10-CM

## 2014-07-30 LAB — COMPREHENSIVE METABOLIC PANEL
ALT: 10 U/L — AB (ref 14–54)
ANION GAP: 7 (ref 5–15)
AST: 27 U/L (ref 15–41)
Albumin: 2.1 g/dL — ABNORMAL LOW (ref 3.5–5.0)
Alkaline Phosphatase: 636 U/L — ABNORMAL HIGH (ref 38–126)
BUN: 6 mg/dL (ref 6–20)
CALCIUM: 7.7 mg/dL — AB (ref 8.9–10.3)
CO2: 29 mmol/L (ref 22–32)
Chloride: 107 mmol/L (ref 101–111)
Creatinine, Ser: 0.96 mg/dL (ref 0.44–1.00)
GFR calc Af Amer: >60 mL/min (ref >60)
GLUCOSE: 142 mg/dL — AB (ref 65–99)
POTASSIUM: 4.5 mmol/L (ref 3.5–5.1)
SODIUM: 143 mmol/L (ref 135–145)
Total Bilirubin: 1.4 mg/dL — ABNORMAL HIGH (ref 0.3–1.2)
Total Protein: 6.9 g/dL (ref 6.5–8.1)

## 2014-07-30 LAB — CBC
HCT: 34 % — ABNORMAL LOW (ref 36.0–46.0)
Hemoglobin: 10.2 g/dL — ABNORMAL LOW (ref 12.0–15.0)
MCH: 28.2 pg (ref 26.0–34.0)
MCHC: 30 g/dL (ref 30.0–36.0)
MCV: 93.9 fL (ref 78.0–100.0)
Platelets: 439 10*3/uL — ABNORMAL HIGH (ref 150–400)
RBC: 3.62 MIL/uL — ABNORMAL LOW (ref 3.87–5.11)
RDW: 15.1 % (ref 11.5–15.5)
WBC: 11.5 10*3/uL — ABNORMAL HIGH (ref 4.0–10.5)

## 2014-07-30 LAB — GLUCOSE, CAPILLARY
GLUCOSE-CAPILLARY: 180 mg/dL — AB (ref 65–99)
Glucose-Capillary: 115 mg/dL — ABNORMAL HIGH (ref 65–99)
Glucose-Capillary: 142 mg/dL — ABNORMAL HIGH (ref 65–99)
Glucose-Capillary: 130 mg/dL — ABNORMAL HIGH (ref 65–99)

## 2014-07-30 NOTE — Progress Notes (Signed)
4 Days Post-Op  Subjective: Tolerating some of her diet.  Objective: Vital signs in last 24 hours: Temp:  [97.3 F (36.3 C)-98.1 F (36.7 C)] 98 F (36.7 C) (07/23 0650) Pulse Rate:  [51-98] 56 (07/23 0905) Resp:  [18] 18 (07/23 0650) BP: (134-152)/(65-87) 152/87 mmHg (07/23 0905) SpO2:  [98 %-100 %] 100 % (07/23 0650) Last BM Date: 07/30/14  Intake/Output from previous day: 07/22 0701 - 07/23 0700 In: 1828.3 [P.O.:840; I.V.:788.3; IV Piggyback:200] Out: 1870 [Urine:1600; Drains:270] Intake/Output this shift: Total I/O In: 2100 [P.O.:480; I.V.:1520; IV Piggyback:100] Out: 430 [Urine:400; Drains:30]  PE: General- In NAD Abdomen-soft, thin purulent drain output  Lab Results:   Recent Labs  07/29/14 0423 07/30/14 0445  WBC 15.7* 11.5*  HGB 10.8* 10.2*  HCT 35.8* 34.0*  PLT 412* 439*   BMET  Recent Labs  07/29/14 0423 07/30/14 0445  NA 149* 143  K 3.6 4.5  CL 113* 107  CO2 31 29  GLUCOSE 165* 142*  BUN 5* 6  CREATININE 1.00 0.96  CALCIUM 8.0* 7.7*   PT/INR No results for input(s): LABPROT, INR in the last 72 hours. Comprehensive Metabolic Panel:    Component Value Date/Time   NA 143 07/30/2014 0445   NA 149* 07/29/2014 0423   NA 137 06/24/2014 1555   NA 139 05/26/2014 1513   K 4.5 07/30/2014 0445   K 3.6 07/29/2014 0423   K 3.9 06/24/2014 1555   K 3.9 05/26/2014 1513   CL 107 07/30/2014 0445   CL 113* 07/29/2014 0423   CO2 29 07/30/2014 0445   CO2 31 07/29/2014 0423   CO2 27 06/24/2014 1555   CO2 25 05/26/2014 1513   BUN 6 07/30/2014 0445   BUN 5* 07/29/2014 0423   BUN 13.0 06/24/2014 1555   BUN 10.2 05/26/2014 1513   CREATININE 0.96 07/30/2014 0445   CREATININE 1.00 07/29/2014 0423   CREATININE 1.2* 06/24/2014 1555   CREATININE 1.0 05/26/2014 1513   GLUCOSE 142* 07/30/2014 0445   GLUCOSE 165* 07/29/2014 0423   GLUCOSE 218* 06/24/2014 1555   GLUCOSE 252* 05/26/2014 1513   CALCIUM 7.7* 07/30/2014 0445   CALCIUM 8.0* 07/29/2014 0423   CALCIUM 9.1 06/24/2014 1555   CALCIUM 8.8 05/26/2014 1513   AST 27 07/30/2014 0445   AST 24 07/29/2014 0423   AST 42* 06/24/2014 1555   AST 38* 05/26/2014 1513   ALT 10* 07/30/2014 0445   ALT 12* 07/29/2014 0423   ALT 45 06/24/2014 1555   ALT 37 05/26/2014 1513   ALKPHOS 636* 07/30/2014 0445   ALKPHOS 561* 07/29/2014 0423   ALKPHOS 612* 06/24/2014 1555   ALKPHOS 564* 05/26/2014 1513   BILITOT 1.4* 07/30/2014 0445   BILITOT 0.9 07/29/2014 0423   BILITOT 2.21* 06/24/2014 1555   BILITOT 1.94* 05/26/2014 1513   PROT 6.9 07/30/2014 0445   PROT 7.4 07/29/2014 0423   PROT 8.0 06/24/2014 1555   PROT 7.8 05/26/2014 1513   ALBUMIN 2.1* 07/30/2014 0445   ALBUMIN 2.3* 07/29/2014 0423   ALBUMIN 2.6* 06/24/2014 1555   ALBUMIN 2.9* 05/26/2014 1513     Studies/Results: Dg Abd 2 Views  08/24/2014   CLINICAL DATA:  65 year old female with metastatic carcinoid tumor. Abdominal pain. Cholecystostomy tube placed earlier, now status post laparoscopic cholecystectomy.  EXAM: ABDOMEN - 2 VIEW  COMPARISON:  07/21/2014 CT Abdomen and Pelvis, and earlier  FINDINGS: Supine and left-side-down lateral decubitus views of the abdomen and pelvis. Cholecystectomy clips. Straight postoperative right abdominal drain in place. No  pneumoperitoneum. Bowel gas pattern within normal limits. Stable visualized osseous structures. Patchy opacity at the visible lung bases.  IMPRESSION: 1. Interval cholecystectomy.  Postoperative drain in place. 2.  Normal bowel gas pattern, no free air. 3. Patchy opacity at both lung bases.   Electronically Signed   By: Genevie Ann M.D.   On: 07/28/2014 12:11    Anti-infectives: Anti-infectives    Start     Dose/Rate Route Frequency Ordered Stop   07/25/14 2000  imipenem-cilastatin (PRIMAXIN) 500 mg in sodium chloride 0.9 % 100 mL IVPB     500 mg 200 mL/hr over 30 Minutes Intravenous Every 6 hours 07/25/14 1510     07/25/14 0000  vancomycin (VANCOCIN) IVPB 750 mg/150 ml premix  Status:   Discontinued     750 mg 150 mL/hr over 60 Minutes Intravenous Every 12 hours 07/24/14 1242 07/26/14 1721   07/23/14 0000  vancomycin (VANCOCIN) IVPB 1000 mg/200 mL premix  Status:  Discontinued     1,000 mg 200 mL/hr over 60 Minutes Intravenous Every 12 hours 07/22/14 1047 07/24/14 1242   07/22/14 1200  vancomycin (VANCOCIN) 2,000 mg in sodium chloride 0.9 % 500 mL IVPB     2,000 mg 250 mL/hr over 120 Minutes Intravenous  Once 07/22/14 1046 07/22/14 1418   07/21/14 1400  imipenem-cilastatin (PRIMAXIN) 500 mg in sodium chloride 0.9 % 100 mL IVPB  Status:  Discontinued     500 mg 200 mL/hr over 30 Minutes Intravenous 3 times per day 07/21/14 0824 07/25/14 1510   07/21/14 1000  ciprofloxacin (CIPRO) IVPB 400 mg  Status:  Discontinued     400 mg 200 mL/hr over 60 Minutes Intravenous Every 12 hours 07/21/14 0622 07/21/14 0803   07/21/14 0830  imipenem-cilastatin (PRIMAXIN) 500 mg in sodium chloride 0.9 % 100 mL IVPB  Status:  Discontinued     500 mg 200 mL/hr over 30 Minutes Intravenous 3 times per day 07/21/14 0822 07/21/14 0824   07/21/14 0800  metroNIDAZOLE (FLAGYL) IVPB 500 mg  Status:  Discontinued     500 mg 100 mL/hr over 60 Minutes Intravenous Every 8 hours 07/21/14 0622 07/21/14 1018   07/20/14 2200  ciprofloxacin (CIPRO) IVPB 400 mg     400 mg 200 mL/hr over 60 Minutes Intravenous  Once 07/20/14 2154 07/21/14 0059   07/20/14 2200  metroNIDAZOLE (FLAGYL) IVPB 500 mg     500 mg 100 mL/hr over 60 Minutes Intravenous  Once 07/20/14 2154 07/21/14 0414      Assessment Chronic and acute cholecystitis s/p lap chole 7/19 (Dr. Edward Jolly. Bili and Alk phos trending up; WBC trending down  Metastatic carcinoid   LOS: 9 days   Plan: Continue IV abxs.  Recheck lab tomorrow.  If LFTs continue to rise, with get MRCP.   Yesenia Lyons Lenna Sciara 07/30/2014

## 2014-07-30 NOTE — Progress Notes (Signed)
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 Burr Medico), diabetes mellitus who was hospitalized one month back for sepsis due to acute cholecystitis and had percutaneous cholecystostomy tube placed presented to the ED with sudden onset of right upper quadrant abdominal pain after she had a drain study done on 7/11. This is associated with nausea and was febrile in the ED with mildly elevated LFTs. She also has significant leukocytosis and elevated lactic acid meeting criteria for sepsis. A CT scan of the abdomen and pelvis showing progression of hepatic and metastatic disease.  Assessment/Plan: Sepsis on admission Resolved since admission. Possibly related to ascending cholangitis versus progressive liver metastases. Remains on Primaxin for broad coverage. Cholecystostomy drain culture growing coag-negative staph. Had been on empiric vancomycin. Blood cultures negative.  -Pt is now s/p laparoscopic cholecystectomy on 7/19 - ID recs to cont imipenem with plans to later transition to PO bactrim when OK with Surgery - Advancing diet to regular per Surgery. Pt tolerating diet thus far  Type 2 diabetes mellitus On sliding scale insulin . continued home dose Lantus. Change regular diet to carb mod  Metastatic carcinoid tumor Follows with Dr. Burr Medico.  CT on admission shows progressive liver and nodal metastases. Pt on octreotide as outpatient. Pt has been seen by Dr Burr Medico with plans for liver targeted therapy by IR once acute illness resolves.  Acute kidney injury Secondary to sepsis.  Resolved with IV hydration.  Continue on Lasix as tolerated for now  Hypokalemia Replenish kcl .  Resumed Aldactone.  Diarrhea  Stool for C. difficile negative.  DVT prophylaxis: Cont subcutaneous heparin while  inpatient  Code Status: Full Family Communication: Pt in room  Disposition Plan: Pending   Consultants:  ID  Oncology  General Surgery  Procedures:    Antibiotics:  IV cipro and flagyl x 1 dose  IV primaxin 7/14--  Iv vancomycin 7/15--7/19  HPI/Subjective: Passing flatus this AM. Tolerating diet  Objective: Filed Vitals:   07/29/14 2150 07/30/14 0650 07/30/14 0905 07/30/14 1434  BP: 151/73 134/80 152/87 142/73  Pulse: 62 51 56 61  Temp: 98.1 F (36.7 C) 98 F (36.7 C)  98.4 F (36.9 C)  TempSrc: Oral Oral  Oral  Resp: 18 18  16   Height:      Weight:      SpO2: 98% 100%  99%    Intake/Output Summary (Last 24 hours) at 07/30/14 1728 Last data filed at 07/30/14 1300  Gross per 24 hour  Intake   2995 ml  Output    880 ml  Net   2115 ml   Filed Weights   07/25/14 0544 07/26/14 0500 07/29/14 0500  Weight: 140.2 kg (309 lb 1.4 oz) 145.9 kg (321 lb 10.4 oz) 142.7 kg (314 lb 9.5 oz)    Exam:   General:  Awake, laying in bed, in nad  Cardiovascular: regular, s1, s2  Respiratory: normal resp effort, no wheezing  Abdomen: soft,nondistended, obese, pos BS, nontender, drain in place with serous appearing fluid in bulb  Musculoskeletal: perfused, no cyanosis  Data Reviewed: Basic Metabolic Panel:  Recent Labs Lab 07/25/14 0850 07/26/14 0420 07/27/14 0345 07/28/14 0534 07/29/14 0423 07/30/14 0445  NA  --  141 144 147* 149* 143  K  --  3.0* 3.3* 3.2* 3.6 4.5  CL  --  108 110 113* 113* 107  CO2  --  28 30 26 31 29   GLUCOSE  --  117* 93 98 165* 142*  BUN  --  5* <5* 5* 5* 6  CREATININE  --  0.92 0.82 0.95 1.00 0.96  CALCIUM  --  8.0* 8.0* 8.1* 8.0* 7.7*  MG 1.8  --   --   --   --   --    Liver Function Tests:  Recent Labs Lab 07/25/14 0844 07/27/14 0345 07/28/14 0534 07/29/14 0423 07/30/14 0445  AST 22 57* 30 24 27   ALT 13* 13* 11* 12* 10*  ALKPHOS 556* 585* 608* 561* 636*  BILITOT 1.1 1.0 1.2 0.9 1.4*  PROT 7.4 7.1 7.2 7.4 6.9   ALBUMIN 2.3* 2.3* 2.4* 2.3* 2.1*   No results for input(s): LIPASE, AMYLASE in the last 168 hours. No results for input(s): AMMONIA in the last 168 hours. CBC:  Recent Labs Lab 07/25/14 0844 07/27/14 0345 07/28/14 0534 07/29/14 0423 07/30/14 0445  WBC 7.8 11.2* 17.0* 15.7* 11.5*  HGB 10.8* 10.9* 11.2* 10.8* 10.2*  HCT 35.9* 37.1 36.3 35.8* 34.0*  MCV 92.8 94.2 91.7 93.7 93.9  PLT 291 348 355 412* 439*   Cardiac Enzymes: No results for input(s): CKTOTAL, CKMB, CKMBINDEX, TROPONINI in the last 168 hours. BNP (last 3 results) No results for input(s): BNP in the last 8760 hours.  ProBNP (last 3 results) No results for input(s): PROBNP in the last 8760 hours.  CBG:  Recent Labs Lab 07/29/14 1702 07/29/14 2223 07/30/14 0755 07/30/14 1159 07/30/14 1717  GLUCAP 176* 187* 115* 142* 180*    Recent Results (from the past 240 hour(s))  Culture, blood (routine x 2)     Status: None   Collection Time: 07/21/14  4:59 AM  Result Value Ref Range Status   Specimen Description BLOOD RIGHT HAND  Final   Special Requests BOTTLES DRAWN AEROBIC ONLY 2.5ML  Final   Culture   Final    NO GROWTH 5 DAYS Performed at [facility]    Report Status 07/26/2014 FINAL  Final  Culture, blood (routine x 2)     Status: None   Collection Time: 07/21/14  4:59 AM  Result Value Ref Range Status   Specimen Description BLOOD RIGHT ANTECUBITAL  Final   Special Requests BOTTLES DRAWN AEROBIC AND ANAEROBIC 5.5ML  Final   Culture   Final    NO GROWTH 5 DAYS Performed at [facility]    Report Status 07/26/2014 FINAL  Final  Body fluid culture     Status: None   Collection Time: 07/21/14 10:42 AM  Result Value Ref Range Status   Specimen Description COMMON BILE DUCT  Final   Special Requests Immunocompromised  Final   Gram Stain   Final    NO WBC SEEN MODERATE GRAM POSITIVE COCCI IN CLUSTERS    Culture   Final    ABUNDANT STAPHYLOCOCCUS SIMULANS Performed at [facility]    Report Status 07/25/2014 FINAL  Final   Organism ID, Bacteria STAPHYLOCOCCUS SIMULANS  Final      Susceptibility   Staphylococcus simulans - MIC*    CIPROFLOXACIN >=8 RESISTANT Resistant     ERYTHROMYCIN >=8 RESISTANT Resistant     GENTAMICIN <=0.5 SENSITIVE Sensitive     OXACILLIN <=0.25 SENSITIVE Sensitive     TETRACYCLINE <=1 SENSITIVE Sensitive     VANCOMYCIN <=0.5 SENSITIVE Sensitive     TRIMETH/SULFA <=10 SENSITIVE Sensitive     CLINDAMYCIN <=0.25 RESISTANT Resistant  RIFAMPIN <=0.5 SENSITIVE Sensitive     Inducible Clindamycin POSITIVE Resistant     * ABUNDANT STAPHYLOCOCCUS SIMULANS  Surgical pcr screen     Status: None   Collection Time: 07/25/14  7:58 PM  Result Value Ref Range Status   MRSA, PCR NEGATIVE NEGATIVE Final   Staphylococcus aureus NEGATIVE NEGATIVE Final    Comment:        The Xpert SA Assay (FDA approved for NASAL specimens in patients over 72 years of age), is one component of a comprehensive surveillance program.  Test performance has been validated by [facility] for patients greater than or equal to 14 year old. It is not intended to diagnose infection nor to guide or monitor treatment.   Clostridium Difficile by PCR (not at [facility])     Status: None   Collection Time: 07/26/14  8:51 AM  Result Value Ref Range Status   C difficile by pcr NEGATIVE NEGATIVE Final     Studies: No results found.  Scheduled Meds: . feeding supplement (ENSURE ENLIVE)  237 mL Oral TID BM  . furosemide  40 mg Oral Daily  . gabapentin  400 mg Oral TID  . heparin subcutaneous  5,000 Units Subcutaneous 3 times per day  . imipenem-cilastatin  500 mg Intravenous Q6H  . insulin aspart  0-15 Units Subcutaneous TID WC  . insulin aspart  0-5 Units Subcutaneous QHS  . insulin aspart  3 Units Subcutaneous TID WC  . insulin glargine  10 Units Subcutaneous BID  . saccharomyces boulardii  250 mg Oral BID  . sodium chloride  3 mL Intravenous Q12H  .  spironolactone  25 mg Oral Daily   Continuous Infusions:    Principal Problem:   Acute cholangitis Active Problems:   Abdominal pain   Diabetes mellitus type 2 in obese   HTN (hypertension)   SIRS (systemic inflammatory response syndrome)   Malignant carcinoid tumor of unknown primary site   Cholecystitis s/p perc drainage w drain 06/09/2014   ARF (acute renal failure)    Bunny Lowdermilk, [facility] Pager [phone]. If 7PM-7AM, please contact night-coverage at [email or web address], password [facility] 07/30/2014, 5:28 PM  LOS: 9 days

## 2014-07-31 LAB — COMPREHENSIVE METABOLIC PANEL
ALK PHOS: 707 U/L — AB (ref 38–126)
ALT: 13 U/L — AB (ref 14–54)
ANION GAP: 6 (ref 5–15)
AST: 29 U/L (ref 15–41)
Albumin: 2.1 g/dL — ABNORMAL LOW (ref 3.5–5.0)
BILIRUBIN TOTAL: 0.6 mg/dL (ref 0.3–1.2)
BUN: 7 mg/dL (ref 6–20)
CHLORIDE: 105 mmol/L (ref 101–111)
CO2: 30 mmol/L (ref 22–32)
Calcium: 8.1 mg/dL — ABNORMAL LOW (ref 8.9–10.3)
Creatinine, Ser: 0.89 mg/dL (ref 0.44–1.00)
GFR calc non Af Amer: >60 mL/min (ref >60)
GLUCOSE: 141 mg/dL — AB (ref 65–99)
POTASSIUM: 3.8 mmol/L (ref 3.5–5.1)
SODIUM: 141 mmol/L (ref 135–145)
Total Protein: 7.2 g/dL (ref 6.5–8.1)

## 2014-07-31 LAB — CBC
HCT: 33.8 % — ABNORMAL LOW (ref 36.0–46.0)
Hemoglobin: 10 g/dL — ABNORMAL LOW (ref 12.0–15.0)
MCH: 27.5 pg (ref 26.0–34.0)
MCHC: 29.6 g/dL — ABNORMAL LOW (ref 30.0–36.0)
MCV: 92.9 fL (ref 78.0–100.0)
Platelets: 397 10*3/uL (ref 150–400)
RBC: 3.64 MIL/uL — AB (ref 3.87–5.11)
RDW: 14.8 % (ref 11.5–15.5)
WBC: 10.5 10*3/uL (ref 4.0–10.5)

## 2014-07-31 LAB — GLUCOSE, CAPILLARY
GLUCOSE-CAPILLARY: 199 mg/dL — AB (ref 65–99)
GLUCOSE-CAPILLARY: 166 mg/dL — AB (ref 65–99)
Glucose-Capillary: 104 mg/dL — ABNORMAL HIGH (ref 65–99)
Glucose-Capillary: 136 mg/dL — ABNORMAL HIGH (ref 65–99)

## 2014-07-31 NOTE — Progress Notes (Signed)
ANTIBIOTIC CONSULT NOTE - FOLLOW UP  Pharmacy Consult for Primaxin Indication: intra-abdominal infection  Allergies  Allergen Reactions  . Penicillins Itching and Swelling    Patient Measurements: Height: 5\' 10"  (177.8 cm) Weight: (!) 314 lb 9.5 oz (142.7 kg) IBW/kg (Calculated) : 68.5  Vital Signs: Temp: 98.5 F (36.9 C) (07/24 0511) Temp Source: Oral (07/24 0511) BP: 159/75 mmHg (07/24 0918) Pulse Rate: 72 (07/24 0918) Intake/Output from previous day: 07/23 0701 - 07/24 0700 In: 2780 [P.O.:960; I.V.:1520; IV Piggyback:300] Out: 2550 [Urine:2300; Drains:250] Intake/Output from this shift: Total I/O In: 100 [IV Piggyback:100] Out: 680 [Urine:650; Drains:30]  Labs:  Recent Labs  07/29/14 0423 07/30/14 0445 07/31/14 0555 07/31/14 0556  WBC 15.7* 11.5*  --  10.5  HGB 10.8* 10.2*  --  10.0*  PLT 412* 439*  --  397  CREATININE 1.00 0.96 0.89  --    Estimated Creatinine Clearance: 97.7 mL/min (by C-G formula based on Cr of 0.89). No results for input(s): VANCOTROUGH, VANCOPEAK, VANCORANDOM, GENTTROUGH, GENTPEAK, GENTRANDOM, TOBRATROUGH, TOBRAPEAK, TOBRARND, AMIKACINPEAK, AMIKACINTROU, AMIKACIN in the last 72 hours.   Microbiology: Recent Results (from the past 720 hour(s))  Culture, blood (routine x 2)     Status: None   Collection Time: 07/21/14  4:59 AM  Result Value Ref Range Status   Specimen Description BLOOD RIGHT HAND  Final   Special Requests BOTTLES DRAWN AEROBIC ONLY 2.5ML  Final   Culture   Final    NO GROWTH 5 DAYS Performed at [facility]    Report Status 07/26/2014 FINAL  Final  Culture, blood (routine x 2)     Status: None   Collection Time: 07/21/14  4:59 AM  Result Value Ref Range Status   Specimen Description BLOOD RIGHT ANTECUBITAL  Final   Special Requests BOTTLES DRAWN AEROBIC AND ANAEROBIC 5.5ML  Final   Culture   Final    NO GROWTH 5 DAYS Performed at [facility]    Report Status 07/26/2014 FINAL  Final  Body fluid  culture     Status: None   Collection Time: 07/21/14 10:42 AM  Result Value Ref Range Status   Specimen Description COMMON BILE DUCT  Final   Special Requests Immunocompromised  Final   Gram Stain   Final    NO WBC SEEN MODERATE GRAM POSITIVE COCCI IN CLUSTERS    Culture   Final    ABUNDANT STAPHYLOCOCCUS SIMULANS Performed at [facility]    Report Status 07/25/2014 FINAL  Final   Organism ID, Bacteria STAPHYLOCOCCUS SIMULANS  Final      Susceptibility   Staphylococcus simulans - MIC*    CIPROFLOXACIN >=8 RESISTANT Resistant     ERYTHROMYCIN >=8 RESISTANT Resistant     GENTAMICIN <=0.5 SENSITIVE Sensitive     OXACILLIN <=0.25 SENSITIVE Sensitive     TETRACYCLINE <=1 SENSITIVE Sensitive     VANCOMYCIN <=0.5 SENSITIVE Sensitive     TRIMETH/SULFA <=10 SENSITIVE Sensitive     CLINDAMYCIN <=0.25 RESISTANT Resistant     RIFAMPIN <=0.5 SENSITIVE Sensitive     Inducible Clindamycin POSITIVE Resistant     * ABUNDANT STAPHYLOCOCCUS SIMULANS  Surgical pcr screen     Status: None   Collection Time: 07/25/14  7:58 PM  Result Value Ref Range Status   MRSA, PCR NEGATIVE NEGATIVE Final   Staphylococcus aureus NEGATIVE NEGATIVE Final    Comment:        The Xpert SA Assay (FDA approved for NASAL specimens in patients over 21  years of age), is one component of a comprehensive surveillance program.  Test performance has been validated by [facility] for patients greater than or equal to 31 year old. It is not intended to diagnose infection nor to guide or monitor treatment.   Clostridium Difficile by PCR (not at [facility])     Status: None   Collection Time: 07/26/14  8:51 AM  Result Value Ref Range Status   C difficile by pcr NEGATIVE NEGATIVE Final   Assessment: Patient's a 64 y.o F s/p placement of cholecystostomy tube in early June 2016 who was admitted on 7/13 with worsening RUQ abdominal pain and low grade fever.  She's currently on primaxin day #8 with common bile duct fluid  culture now positive for for staph simulans.   ID recommends to continue with primaxin for now and to transition to bactrim at discharge.  7/13 >> Cipro >> 7/14 7/14 >> Flagyl >> 7/14 7/14 >> Primaxin >>  7/15 >> Vanc >>7/19  7/14 blood x2 : neg FINAL 7/14 fluid from common bile duct: IP; gram stain showed moderate GPC in clusters-> MS-Coag neg Staph(staph simulans) - sens to gent, oxacillin, rifamp, tcn, septra, vanc 7/19 cdiff: neg  Today, 07/31/2014: Afebrile WBCs now in the normal range SCr has been stable, CrCl~98.  Plan:  Day #11 Primaxin 500mg  IV q6h.  What is the planned duration of therapy? The ID note did not specify. Pharmacy will sign-off since dose-adjustments are not likely to be necessary.   Romeo Rabon, PharmD, pager [phone]. 07/31/2014,12:47 PM.

## 2014-07-31 NOTE — Progress Notes (Signed)
5 Days Post-Op  Subjective: Feeling good. Bowel moving.  Objective: Vital signs in last 24 hours: Temp:  [97.9 F (36.6 C)-98.5 F (36.9 C)] 98.5 F (36.9 C) (07/24 0511) Pulse Rate:  [61-74] 72 (07/24 0918) Resp:  [16-18] 18 (07/24 0511) BP: (140-159)/(73-88) 159/75 mmHg (07/24 0918) SpO2:  [98 %-99 %] 98 % (07/24 0511) Last BM Date: 07/31/14  Intake/Output from previous day: 07/23 0701 - 07/24 0700 In: 2780 [P.O.:960; I.V.:1520; IV Piggyback:300] Out: 2550 [Urine:2300; Drains:250] Intake/Output this shift: Total I/O In: -  Out: 380 [Urine:350; Drains:30]  PE: General- In NAD Abdomen-soft, thin cloudy drain output  Lab Results:   Recent Labs  07/30/14 0445 07/31/14 0556  WBC 11.5* 10.5  HGB 10.2* 10.0*  HCT 34.0* 33.8*  PLT 439* 397   BMET  Recent Labs  07/30/14 0445 07/31/14 0555  NA 143 141  K 4.5 3.8  CL 107 105  CO2 29 30  GLUCOSE 142* 141*  BUN 6 7  CREATININE 0.96 0.89  CALCIUM 7.7* 8.1*   PT/INR No results for input(s): LABPROT, INR in the last 72 hours. Comprehensive Metabolic Panel:    Component Value Date/Time   NA 141 07/31/2014 0555   NA 143 07/30/2014 0445   NA 137 06/24/2014 1555   NA 139 05/26/2014 1513   K 3.8 07/31/2014 0555   K 4.5 07/30/2014 0445   K 3.9 06/24/2014 1555   K 3.9 05/26/2014 1513   CL 105 07/31/2014 0555   CL 107 07/30/2014 0445   CO2 30 07/31/2014 0555   CO2 29 07/30/2014 0445   CO2 27 06/24/2014 1555   CO2 25 05/26/2014 1513   BUN 7 07/31/2014 0555   BUN 6 07/30/2014 0445   BUN 13.0 06/24/2014 1555   BUN 10.2 05/26/2014 1513   CREATININE 0.89 07/31/2014 0555   CREATININE 0.96 07/30/2014 0445   CREATININE 1.2* 06/24/2014 1555   CREATININE 1.0 05/26/2014 1513   GLUCOSE 141* 07/31/2014 0555   GLUCOSE 142* 07/30/2014 0445   GLUCOSE 218* 06/24/2014 1555   GLUCOSE 252* 05/26/2014 1513   CALCIUM 8.1* 07/31/2014 0555   CALCIUM 7.7* 07/30/2014 0445   CALCIUM 9.1 06/24/2014 1555   CALCIUM 8.8 05/26/2014  1513   AST 29 07/31/2014 0555   AST 27 07/30/2014 0445   AST 42* 06/24/2014 1555   AST 38* 05/26/2014 1513   ALT 13* 07/31/2014 0555   ALT 10* 07/30/2014 0445   ALT 45 06/24/2014 1555   ALT 37 05/26/2014 1513   ALKPHOS 707* 07/31/2014 0555   ALKPHOS 636* 07/30/2014 0445   ALKPHOS 612* 06/24/2014 1555   ALKPHOS 564* 05/26/2014 1513   BILITOT 0.6 07/31/2014 0555   BILITOT 1.4* 07/30/2014 0445   BILITOT 2.21* 06/24/2014 1555   BILITOT 1.94* 05/26/2014 1513   PROT 7.2 07/31/2014 0555   PROT 6.9 07/30/2014 0445   PROT 8.0 06/24/2014 1555   PROT 7.8 05/26/2014 1513   ALBUMIN 2.1* 07/31/2014 0555   ALBUMIN 2.1* 07/30/2014 0445   ALBUMIN 2.6* 06/24/2014 1555   ALBUMIN 2.9* 05/26/2014 1513     Studies/Results: No results found.  Anti-infectives: Anti-infectives    Start     Dose/Rate Route Frequency Ordered Stop   07/25/14 2000  imipenem-cilastatin (PRIMAXIN) 500 mg in sodium chloride 0.9 % 100 mL IVPB     500 mg 200 mL/hr over 30 Minutes Intravenous Every 6 hours 07/25/14 1510     07/25/14 0000  vancomycin (VANCOCIN) IVPB 750 mg/150 ml premix  Status:  Discontinued     750 mg 150 mL/hr over 60 Minutes Intravenous Every 12 hours 07/24/14 1242 07/26/14 1721   07/23/14 0000  vancomycin (VANCOCIN) IVPB 1000 mg/200 mL premix  Status:  Discontinued     1,000 mg 200 mL/hr over 60 Minutes Intravenous Every 12 hours 07/22/14 1047 07/24/14 1242   07/22/14 1200  vancomycin (VANCOCIN) 2,000 mg in sodium chloride 0.9 % 500 mL IVPB     2,000 mg 250 mL/hr over 120 Minutes Intravenous  Once 07/22/14 1046 07/22/14 1418   07/21/14 1400  imipenem-cilastatin (PRIMAXIN) 500 mg in sodium chloride 0.9 % 100 mL IVPB  Status:  Discontinued     500 mg 200 mL/hr over 30 Minutes Intravenous 3 times per day 07/21/14 0824 07/25/14 1510   07/21/14 1000  ciprofloxacin (CIPRO) IVPB 400 mg  Status:  Discontinued     400 mg 200 mL/hr over 60 Minutes Intravenous Every 12 hours 07/21/14 0622 07/21/14 0803    07/21/14 0830  imipenem-cilastatin (PRIMAXIN) 500 mg in sodium chloride 0.9 % 100 mL IVPB  Status:  Discontinued     500 mg 200 mL/hr over 30 Minutes Intravenous 3 times per day 07/21/14 0822 07/21/14 0824   07/21/14 0800  metroNIDAZOLE (FLAGYL) IVPB 500 mg  Status:  Discontinued     500 mg 100 mL/hr over 60 Minutes Intravenous Every 8 hours 07/21/14 0622 07/21/14 1018   07/20/14 2200  ciprofloxacin (CIPRO) IVPB 400 mg     400 mg 200 mL/hr over 60 Minutes Intravenous  Once 07/20/14 2154 07/21/14 0059   07/20/14 2200  metroNIDAZOLE (FLAGYL) IVPB 500 mg     500 mg 100 mL/hr over 60 Minutes Intravenous  Once 07/20/14 2154 07/21/14 0414      Assessment Chronic and acute cholecystitis s/p lap chole 7/19 (Dr. Edward Jolly. Bili down today but Alk phos still  trending up  Metastatic carcinoid   LOS: 10 days   Plan: Continue IV abxs. MRCP just to make sure she does not have small CBD stones or sludge.  Yesenia Lyons 07/31/2014

## 2014-07-31 NOTE — Progress Notes (Signed)
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 Burr Medico), diabetes mellitus who was hospitalized one month back for sepsis due to acute cholecystitis and had percutaneous cholecystostomy tube placed presented to the ED with sudden onset of right upper quadrant abdominal pain after she had a drain study done on 7/11. This is associated with nausea and was febrile in the ED with mildly elevated LFTs. She also has significant leukocytosis and elevated lactic acid meeting criteria for sepsis. A CT scan of the abdomen and pelvis showing progression of hepatic and metastatic disease.  Assessment/Plan: Sepsis on admission Resolved since admission. Likely secondary to ascending cholangitis versus progressive liver metastases. Pt remains on Primaxin for broad coverage. Cholecystostomy drain culture growing coag-negative staph. Had been on empiric vancomycin. Blood cultures negative.  - Pt is s/p laparoscopic cholecystectomy on 7/19 - ID recs to cont imipenem with plans to later transition to PO bactrim when OK with Surgery - Advanced to diet to regular per Surgery. Cont to tolerate diet thus far  Type 2 diabetes mellitus On sliding scale insulin . continued home dose Lantus. Change regular diet to carb mod  Metastatic carcinoid tumor Follows with Dr. Burr Medico.  CT on admission shows progressive liver and nodal metastases. Pt on octreotide as outpatient. Pt has been seen by Dr Burr Medico with plans for liver targeted therapy by IR once acute illness resolves.  Acute kidney injury Secondary to sepsis.  Resolved with IV hydration.  Pt is continued on Lasix as tolerated for now  Hypokalemia Replaced Continued on Aldactone.  Diarrhea  Stool for C. difficile negative.  DVT prophylaxis: Cont subcutaneous heparin while  inpatient  Elevated LFT's MRCP ordered per General Surgery  Code Status: Full Family Communication: Pt in room  Disposition Plan: Pending   Consultants:  ID  Oncology  General Surgery  Procedures:    Antibiotics:  IV cipro and flagyl x 1 dose  IV primaxin 7/14--  Iv vancomycin 7/15--7/19  HPI/Subjective: Cont to tolerate diet. Denies abd pain  Objective: Filed Vitals:   07/30/14 2157 07/31/14 0511 07/31/14 0918 07/31/14 1356  BP: 151/88 140/78 159/75 143/66  Pulse: 74 68 72 56  Temp: 97.9 F (36.6 C) 98.5 F (36.9 C)  97.4 F (36.3 C)  TempSrc: Oral Oral  Oral  Resp: 18 18  18   Height:      Weight:      SpO2: 98% 98%  97%    Intake/Output Summary (Last 24 hours) at 07/31/14 1632 Last data filed at 07/31/14 1624  Gross per 24 hour  Intake   2120 ml  Output   3515 ml  Net  -1395 ml   Filed Weights   07/25/14 0544 07/26/14 0500 07/29/14 0500  Weight: 140.2 kg (309 lb 1.4 oz) 145.9 kg (321 lb 10.4 oz) 142.7 kg (314 lb 9.5 oz)    Exam:   General:  Awake, sitting in chair, in nad  Cardiovascular: regular, s1, s2  Respiratory: normal resp effort, no wheezing  Abdomen: soft,nondistended, obese, pos BS, nontender  Musculoskeletal: perfused, no cyanosis, trace LE edema  Data Reviewed: Basic Metabolic Panel:  Recent Labs Lab 07/25/14 0850  07/27/14 0345 07/28/14 0534 07/29/14 0423 07/30/14 0445 07/31/14 0555  NA  --   < > 144 147* 149* 143 141  K  --   < > 3.3* 3.2* 3.6 4.5  3.8  CL  --   < > 110 113* 113* 107 105  CO2  --   < > 30 26 31 29 30   GLUCOSE  --   < > 93 98 165* 142* 141*  BUN  --   < > <5* 5* 5* 6 7  CREATININE  --   < > 0.82 0.95 1.00 0.96 0.89  CALCIUM  --   < > 8.0* 8.1* 8.0* 7.7* 8.1*  MG 1.8  --   --   --   --   --   --   < > = values in this interval not displayed. Liver Function Tests:  Recent Labs Lab 07/27/14 0345 07/28/14 0534 07/29/14 0423 07/30/14 0445 07/31/14 0555  AST 57* 30 24 27 29   ALT 13* 11*  12* 10* 13*  ALKPHOS 585* 608* 561* 636* 707*  BILITOT 1.0 1.2 0.9 1.4* 0.6  PROT 7.1 7.2 7.4 6.9 7.2  ALBUMIN 2.3* 2.4* 2.3* 2.1* 2.1*   No results for input(s): LIPASE, AMYLASE in the last 168 hours. No results for input(s): AMMONIA in the last 168 hours. CBC:  Recent Labs Lab 07/27/14 0345 07/28/14 0534 07/29/14 0423 07/30/14 0445 07/31/14 0556  WBC 11.2* 17.0* 15.7* 11.5* 10.5  HGB 10.9* 11.2* 10.8* 10.2* 10.0*  HCT 37.1 36.3 35.8* 34.0* 33.8*  MCV 94.2 91.7 93.7 93.9 92.9  PLT 348 355 412* 439* 397   Cardiac Enzymes: No results for input(s): CKTOTAL, CKMB, CKMBINDEX, TROPONINI in the last 168 hours. BNP (last 3 results) No results for input(s): BNP in the last 8760 hours.  ProBNP (last 3 results) No results for input(s): PROBNP in the last 8760 hours.  CBG:  Recent Labs Lab 07/30/14 1159 07/30/14 1717 07/30/14 2151 07/31/14 0838 07/31/14 1235  GLUCAP 142* 180* 130* 104* 199*    Recent Results (from the past 240 hour(s))  Surgical pcr screen     Status: None   Collection Time: 07/25/14  7:58 PM  Result Value Ref Range Status   MRSA, PCR NEGATIVE NEGATIVE Final   Staphylococcus aureus NEGATIVE NEGATIVE Final    Comment:        The Xpert SA Assay (FDA approved for NASAL specimens in patients over 31 years of age), is one component of a comprehensive surveillance program.  Test performance has been validated by [facility] for patients greater than or equal to 75 year old. It is not intended to diagnose infection nor to guide or monitor treatment.   Clostridium Difficile by PCR (not at [facility])     Status: None   Collection Time: 07/26/14  8:51 AM  Result Value Ref Range Status   C difficile by pcr NEGATIVE NEGATIVE Final     Studies: No results found.  Scheduled Meds: . feeding supplement (ENSURE ENLIVE)  237 mL Oral TID BM  . furosemide  40 mg Oral Daily  . gabapentin  400 mg Oral TID  . heparin subcutaneous  5,000 Units Subcutaneous 3 times  per day  . imipenem-cilastatin  500 mg Intravenous Q6H  . insulin aspart  0-15 Units Subcutaneous TID WC  . insulin aspart  0-5 Units Subcutaneous QHS  . insulin aspart  3 Units Subcutaneous TID WC  . insulin glargine  10 Units Subcutaneous BID  . saccharomyces boulardii  250 mg Oral BID  . sodium chloride  3 mL Intravenous Q12H  . spironolactone  25 mg Oral Daily   Continuous Infusions:    Principal Problem:   Acute  cholangitis Active Problems:   Abdominal pain   Diabetes mellitus type 2 in obese   HTN (hypertension)   SIRS (systemic inflammatory response syndrome)   Malignant carcinoid tumor of unknown primary site   Cholecystitis s/p perc drainage w drain 06/09/2014   ARF (acute renal failure)    Yesenia Lyons, [facility] Pager [phone]. If 7PM-7AM, please contact night-coverage at [email or web address], password [facility] 07/31/2014, 4:32 PM  LOS: 10 days

## 2014-08-01 ENCOUNTER — Inpatient Hospital Stay (HOSPITAL_COMMUNITY): Payer: Medicare Other

## 2014-08-01 ENCOUNTER — Other Ambulatory Visit: Payer: Self-pay | Admitting: Hematology

## 2014-08-01 DIAGNOSIS — K811 Chronic cholecystitis: Secondary | ICD-10-CM

## 2014-08-01 LAB — COMPREHENSIVE METABOLIC PANEL
ALT: 16 U/L (ref 14–54)
AST: 38 U/L (ref 15–41)
Albumin: 2.2 g/dL — ABNORMAL LOW (ref 3.5–5.0)
Alkaline Phosphatase: 742 U/L — ABNORMAL HIGH (ref 38–126)
Anion gap: 8 (ref 5–15)
BUN: 8 mg/dL (ref 6–20)
CALCIUM: 8.3 mg/dL — AB (ref 8.9–10.3)
CHLORIDE: 103 mmol/L (ref 101–111)
CO2: 32 mmol/L (ref 22–32)
CREATININE: 0.91 mg/dL (ref 0.44–1.00)
GFR calc non Af Amer: >60 mL/min (ref >60)
GLUCOSE: 149 mg/dL — AB (ref 65–99)
Potassium: 3.9 mmol/L (ref 3.5–5.1)
Sodium: 143 mmol/L (ref 135–145)
Total Bilirubin: 0.7 mg/dL (ref 0.3–1.2)
Total Protein: 7 g/dL (ref 6.5–8.1)

## 2014-08-01 LAB — GLUCOSE, CAPILLARY
Glucose-Capillary: 127 mg/dL — ABNORMAL HIGH (ref 65–99)
Glucose-Capillary: 97 mg/dL (ref 65–99)
Glucose-Capillary: 231 mg/dL — ABNORMAL HIGH (ref 65–99)

## 2014-08-01 MED ORDER — METHOCARBAMOL 500 MG PO TABS: 1000.0000 mg | ORAL_TABLET | Freq: Three times a day (TID) | ORAL | Status: AC | PRN

## 2014-08-01 MED ORDER — LORAZEPAM 2 MG/ML IJ SOLN
1.0000 mg | Freq: Once | INTRAMUSCULAR | Status: AC
  Administered 2014-08-01: 1 mg via INTRAVENOUS
  Filled 2014-08-01: qty 1

## 2014-08-01 MED ORDER — SULFAMETHOXAZOLE-TRIMETHOPRIM 800-160 MG PO TABS: 1.0000 | ORAL_TABLET | Freq: Two times a day (BID) | ORAL | Status: DC

## 2014-08-01 NOTE — Progress Notes (Signed)
Discharge instructions given to patient and daughter along with prescriptions.  Questions answered.  Both stated understanding.  AVS signed pt discharged via wheelchair

## 2014-08-01 NOTE — Discharge Summary (Addendum)
Physician Discharge Summary  Yesenia Lyons XLK:[id number] DOB: 1950/09/15 DOA: 07/20/2014  PCP: Isaias Cowman, PA-C  Admit date: 07/20/2014 Discharge date: 08/01/2014  Time spent: 20 minutes  Recommendations for Outpatient Follow-up:  1. Follow up with PCP in 1-2 weeks 2.  follow up with Surgery as scheduled 3. Follow up with Oncology as scheduled  Discharge Diagnoses:  Principal Problem:   Acute cholangitis Active Problems:   Abdominal pain   Diabetes mellitus type 2 in obese   HTN (hypertension)   SIRS (systemic inflammatory response syndrome)   Malignant carcinoid tumor of unknown primary site   Cholecystitis s/p perc drainage w drain 06/09/2014   ARF (acute renal failure)   Discharge Condition: Improved  Diet recommendation: Diabetic  Filed Weights   07/25/14 0544 07/26/14 0500 07/29/14 0500  Weight: 140.2 kg (309 lb 1.4 oz) 145.9 kg (321 lb 10.4 oz) 142.7 kg (314 lb 9.5 oz)    History of present illness:  Please see admit h and p from 7/14 for details. Briefly, pt presented with RUQ pain with elevated LFT's and concerns of presenting SIRS with intra-abdominal source. The patient was admitted for further workup.  Hospital Course:  Sepsis on admission -Resolved since admission. Likely secondary to ascending cholangitis versus progressive liver metastases. -Cholecystostomy drain culture growing coag-negative staph. Had been on empiric vancomycin and primaxin, later to primaxin alone with recommendations from ID to transition to PO bactrim on discharge -Blood cultures were negative.  - Pt is s/p laparoscopic cholecystectomy on 7/19 - Advanced to diet to regular per Surgery. Cont to tolerate diet thus far - Pt to be discharged with drain in place, follow up with General Surgery  Type 2 diabetes mellitus On sliding scale insulin . continued home dose Lantus.  Metastatic carcinoid tumor Follows with Dr. Burr Medico.  CT on admission shows progressive liver and nodal  metastases. Pt on octreotide as outpatient. Pt has been seen by Dr Burr Medico with plans for liver targeted therapy by IR once acute illness resolves. Dr. Burr Medico to follow up as outpatient in 2 weeks  Acute kidney injury Secondary to sepsis.  Resolved with IV hydration.   Hypokalemia Replaced Continued on Aldactone.  Diarrhea  Stool for C. difficile negative.  DVT prophylaxis: Cont subcutaneous heparin while inpatient  Elevated LFT's MRCP initially ordered per General Surgery however pt would not fit in MRI scanner Other LFT's unremarkable Would cont work up as outpatient. Surgery has signed off and pt is cleared for discharge  Severe protein calorie malnutrition  Consultations:  ID  Oncology  General Surgery  Discharge Exam: Filed Vitals:   07/31/14 1356 07/31/14 2216 08/01/14 0609 08/01/14 1116  BP: 143/66 140/64 139/68 129/66  Pulse: 56 62 61 68  Temp: 97.4 F (36.3 C) 98.2 F (36.8 C) 98 F (36.7 C) 97.8 F (36.6 C)  TempSrc: Oral Oral Oral Oral  Resp: 18 18 18 18   Height:      Weight:      SpO2: 97% 98% 98% 97%    General: awake in nad Cardiovascular: regular, s1, s2 Respiratory: normal resp effort, no wheezing  Discharge Instructions     Medication List    STOP taking these medications        levofloxacin 750 MG tablet  Commonly known as:  LEVAQUIN      TAKE these medications        albuterol 108 (90 BASE) MCG/ACT inhaler  Commonly known as:  PROVENTIL HFA;VENTOLIN HFA  Inhale 2 puffs into the lungs every  6 (six) hours as needed for wheezing (wheezing).     amLODipine-benazepril 5-10 MG per capsule  Commonly known as:  LOTREL  Take 1 capsule by mouth daily.     beclomethasone 80 MCG/ACT inhaler  Commonly known as:  QVAR  Inhale 1 puff into the lungs as needed (sob). breathing     feeding supplement (ENSURE COMPLETE) Liqd  Take 237 mLs by mouth 2 (two) times daily between meals.     ferrous gluconate 324 MG tablet  Commonly known as:   FERGON  Take 324 mg by mouth 2 (two) times daily with a meal.     furosemide 40 MG tablet  Commonly known as:  LASIX  Take 1 tablet (40 mg total) by mouth daily.     gabapentin 400 MG capsule  Commonly known as:  NEURONTIN  Take 1 capsule (400 mg total) by mouth 3 (three) times daily.     Ginger Root 550 MG Caps  Take 1 capsule by mouth daily as needed (nutrition).     insulin glargine 100 UNIT/ML injection  Commonly known as:  LANTUS  Inject 0.1 mLs (10 Units total) into the skin 2 (two) times daily. In the morning and at bedtime.     methocarbamol 500 MG tablet  Commonly known as:  ROBAXIN  Take 2 tablets (1,000 mg total) by mouth every 8 (eight) hours as needed for muscle spasms.     ondansetron 4 MG disintegrating tablet  Commonly known as:  ZOFRAN ODT  Take 1 tablet (4 mg total) by mouth every 8 (eight) hours as needed for nausea or vomiting.     oxyCODONE-acetaminophen 10-325 MG per tablet  Commonly known as:  PERCOCET  Take 1 tablet by mouth every 8 (eight) hours as needed for pain.     saccharomyces boulardii 250 MG capsule  Commonly known as:  FLORASTOR  Take 1 capsule (250 mg total) by mouth 2 (two) times daily.     spironolactone 25 MG tablet  Commonly known as:  ALDACTONE  Take 25 mg by mouth daily.     sulfamethoxazole-trimethoprim 800-160 MG per tablet  Commonly known as:  BACTRIM DS,SEPTRA DS  Take 1 tablet by mouth 2 (two) times daily.     vitamin C 500 MG tablet  Commonly known as:  ASCORBIC ACID  Take 1 tablet (500 mg total) by mouth daily.       Allergies  Allergen Reactions  . Penicillins Itching and Swelling   Follow-up Information    Follow up with Edward Jolly, MD On 08/17/2014.   Specialty:  General Surgery   Why:  We have you tenatively scheduled for 8:45 AM.  The office will call to confirm this.   Contact information:   [address] [place] [address] [phone]       Follow up with [facility].    Why:  HHPT   Contact information:   [address] [place] [address] [phone]       Follow up with Isaias Cowman, PA-C. Schedule an appointment as soon as possible for a visit in 1 week.   Specialty:  Cardiology   Why:  Hospital follow up   Contact information:   [facility]  [address] [place] [address] [phone]        The results of significant diagnostics from this hospitalization (including imaging, microbiology, ancillary and laboratory) are listed below for reference.    Significant Diagnostic  Studies: Dg Chest 2 View  07/25/2014   CLINICAL DATA:  Sepsis.  EXAM: CHEST  2 VIEW  COMPARISON:  Jun 03, 2014.  FINDINGS: Stable cardiomediastinal silhouette. Hypoinflation of the lungs is noted. No pneumothorax or pleural effusion is noted. Right-sided PICC line is noted with distal tip in the expected position of the SVC. Mild central pulmonary vascular congestion is noted. Probable minimal bibasilar subsegmental atelectasis. Bony thorax is intact.  IMPRESSION: Hypoinflation of the lungs. Mild central pulmonary vascular congestion. Probable minimal bibasilar subsegmental atelectasis.   Electronically Signed   By: Marijo Conception, M.D.   On: 07/25/2014 10:40   Ct Abdomen Pelvis W Contrast  07/21/2014   CLINICAL DATA:  64 year old female with upper abdominal pain and cholecystostomy tube.  EXAM: CT ABDOMEN AND PELVIS WITH CONTRAST  TECHNIQUE: Multidetector CT imaging of the abdomen and pelvis was performed using the standard protocol following bolus administration of intravenous contrast.  CONTRAST:  119mL OMNIPAQUE IOHEXOL 300 MG/ML  SOLN  COMPARISON:  CT dated 12/27/2013 and ultrasound dated 07/20/2014 appearance  FINDINGS: Bibasilar linear atelectasis/ scarring. Top-normal cardiac size. No intra-abdominal free air. Small perihepatic ascites.  There are innumerable hepatic hypodense lesions compatible with metastatic disease. Direct comparison for  interval change in the size of the hepatic lesions since the prior CT is difficult as the lesions were less conspicuous on the prior study. A 3.4 x 3.4 cm lesion seen in the right lobe of the liver. A 5.7 x 8.6 cm area of hypo enhancement is noted in the left lobe of the liver, likely confluence of adjacent metastatic lesions. A percutaneous transhepatic cholecystostomy is noted with tip in the gallbladder. The gallbladder is decompressed. The pancreas is atrophic. The spleen and the right adrenal gland appear unremarkable. There is a 6.7 x 5.7 (previously 6.0 x 5.3 cm) heterogeneous centrally necrotic left adrenal mass. The kidneys and urinary bladder appear unremarkable. Hysterectomy.  Moderate stool noted throughout the colon. There is no evidence of bowel obstruction or inflammation. The visualized abdominal aorta and IVC appear patent. No portal venous gas identified. There is retroperitoneal and mesenteric adenopathy. A 5.5 x 3.1 cm (previously 4.4 by 2.8 cm) partially calcified ovoid soft tissue mesentery mass noted in the lower abdomen. A small nodular density adjacent to the spleen appears similar as the prior study and likely represents a splenic. Small nodular densities at the right cardiophrenic angle represent small lymph nodes. Metastatic disease is less likely but not excluded.  Midline vertical anterior pelvic wall incisional scar. Degenerative changes of the spine with multilevel osteophyte formation. No acute fracture.  IMPRESSION: Interval progression of the hepatic and nodal metastatic disease. There is interval increase in the size of the left adrenal mass and mesenteric mass/lymph node.  Percutaneous transhepatic cholecystostomy. No calcified gallstone or biliary ductal dilatation.  No evidence of bowel obstruction or inflammation.  Normal appendix.   Electronically Signed   By: Anner Crete M.D.   On: 07/21/2014 01:09   US Abdomen Limited  07/20/2014   CLINICAL DATA:  Right upper  quadrant pain after surgery on Monday. Gallbladder drain.  EXAM: US ABDOMEN LIMITED - RIGHT UPPER QUADRANT  COMPARISON:  CT abdomen and pelvis 06/01/2014  FINDINGS: Examination is technically limited due to patient's body habitus and bowel gas.  Gallbladder:  Gallbladder is not visualized. It is likely decompressed with gallbladder drain in place.  Common bile duct:  Diameter: 2.5 mm, normal  Liver:  Diffusely heterogeneous hepatic parenchymal echotexture with nodular contour. Specific  lesions are poorly defined, but the appearance is consistent with known hepatic metastases.  IMPRESSION: Gallbladder is not visualized and is likely decompressed. No bile duct dilatation. Heterogeneous parenchymal appearance of the liver consistent with known metastasis.   Electronically Signed   By: Lucienne Capers M.D.   On: 07/20/2014 23:14   Dg Abd 2 Views  07/28/2014   CLINICAL DATA:  64 year old female with metastatic carcinoid tumor. Abdominal pain. Cholecystostomy tube placed earlier, now status post laparoscopic cholecystectomy.  EXAM: ABDOMEN - 2 VIEW  COMPARISON:  07/21/2014 CT Abdomen and Pelvis, and earlier  FINDINGS: Supine and left-side-down lateral decubitus views of the abdomen and pelvis. Cholecystectomy clips. Straight postoperative right abdominal drain in place. No pneumoperitoneum. Bowel gas pattern within normal limits. Stable visualized osseous structures. Patchy opacity at the visible lung bases.  IMPRESSION: 1. Interval cholecystectomy.  Postoperative drain in place. 2.  Normal bowel gas pattern, no free air. 3. Patchy opacity at both lung bases.   Electronically Signed   By: Genevie Ann M.D.   On: 07/28/2014 12:11   Ir Cholangiogram Existing Tube  07/18/2014   CLINICAL DATA:  Cholecystitis, status post percutaneous cholecystostomy placement 06/08/2014. Carcinoid tumor metastatic to liver.  EXAM: CHOLECYSTOSTOMY CATHETER INJECTION UNDER FLUOROSCOPY  TECHNIQUE: The procedure, risks (including but not  limited to bleeding, infection, organ damage ), benefits, and alternatives were explained to the patient. Questions regarding the procedure were encouraged and answered. The patient understands and consents to the procedure.  Survey fluoroscopic inspection reveals stable positioning of the cholecystostomy catheter.  Injection demonstrates good filling of the gallbladder. Persistent small filling defects are noted in the lumen. Cystic duct and common bile duct are patent and nondilated. Contrast flows into the duodenum. There is no leakage. The patient remained asymptomatic during the injection. Most of the contrast could be aspirated out of the gallbladder lumen.  IMPRESSION: 1. Patent cystic and common bile ducts. 2. Persistent small filling defects in the lumen of the gallbladder, may represent stones or tumefactive sludge. Because of this, catheter was left in place and returned to external gravity drain bag. Findings were telephoned to Dr. Johney Maine. Patient instructed to schedule follow-up in his office.   Electronically Signed   By: Lucrezia Europe M.D.   On: 07/18/2014 09:55    Microbiology: Recent Results (from the past 240 hour(s))  Surgical pcr screen     Status: None   Collection Time: 07/25/14  7:58 PM  Result Value Ref Range Status   MRSA, PCR NEGATIVE NEGATIVE Final   Staphylococcus aureus NEGATIVE NEGATIVE Final    Comment:        The Xpert SA Assay (FDA approved for NASAL specimens in patients over 78 years of age), is one component of a comprehensive surveillance program.  Test performance has been validated by [facility] for patients greater than or equal to 61 year old. It is not intended to diagnose infection nor to guide or monitor treatment.   Clostridium Difficile by PCR (not at [facility])     Status: None   Collection Time: 07/26/14  8:51 AM  Result Value Ref Range Status   C difficile by pcr NEGATIVE NEGATIVE Final     Labs: Basic Metabolic Panel:  Recent Labs Lab  07/28/14 0534 07/29/14 0423 07/30/14 0445 07/31/14 0555 08/01/14 0544  NA 147* 149* 143 141 143  K 3.2* 3.6 4.5 3.8 3.9  CL 113* 113* 107 105 103  CO2 26 31 29 30  32  GLUCOSE 98 165*  142* 141* 149*  BUN 5* 5* 6 7 8   CREATININE 0.95 1.00 0.96 0.89 0.91  CALCIUM 8.1* 8.0* 7.7* 8.1* 8.3*   Liver Function Tests:  Recent Labs Lab 07/28/14 0534 07/29/14 0423 07/30/14 0445 07/31/14 0555 08/01/14 0544  AST 30 24 27 29  38  ALT 11* 12* 10* 13* 16  ALKPHOS 608* 561* 636* 707* 742*  BILITOT 1.2 0.9 1.4* 0.6 0.7  PROT 7.2 7.4 6.9 7.2 7.0  ALBUMIN 2.4* 2.3* 2.1* 2.1* 2.2*   No results for input(s): LIPASE, AMYLASE in the last 168 hours. No results for input(s): AMMONIA in the last 168 hours. CBC:  Recent Labs Lab 07/27/14 0345 07/28/14 0534 07/29/14 0423 07/30/14 0445 07/31/14 0556  WBC 11.2* 17.0* 15.7* 11.5* 10.5  HGB 10.9* 11.2* 10.8* 10.2* 10.0*  HCT 37.1 36.3 35.8* 34.0* 33.8*  MCV 94.2 91.7 93.7 93.9 92.9  PLT 348 355 412* 439* 397   Cardiac Enzymes: No results for input(s): CKTOTAL, CKMB, CKMBINDEX, TROPONINI in the last 168 hours. BNP: BNP (last 3 results) No results for input(s): BNP in the last 8760 hours.  ProBNP (last 3 results) No results for input(s): PROBNP in the last 8760 hours.  CBG:  Recent Labs Lab 07/31/14 1235 07/31/14 1641 07/31/14 2106 08/01/14 0801 08/01/14 1215  GLUCAP 199* 136* 166* 127* 97    Signed:  Elianah Karis K  [facility] 08/01/2014, 2:10 PM

## 2014-08-01 NOTE — Progress Notes (Signed)
Yesenia Lyons   DOB:1950/05/07   MH#:[id number]   [id number]#:[id number]  Subjective: See had  Cholecystectomy on July 19,  Tolerated well,  She is on regular diet now. Drainage tube still in place,  Afebrile , pains manageable.  Objective:  Filed Vitals:   08/01/14 1450  BP: 128/87  Pulse: 78  Temp: 97.6 F (36.4 C)  Resp: 18    Body mass index is 45.14 kg/(m^2).  Intake/Output Summary (Last 24 hours) at 08/01/14 1646 Last data filed at 08/01/14 1503  Gross per 24 hour  Intake    800 ml  Output   4820 ml  Net  -4020 ml     Sclerae slightly jaundice  Oropharynx clear  No peripheral adenopathy  Lungs clear -- no rales or rhonchi  Heart regular rate and rhythm  Abdomen soft, (+)  Drainage tube at the surgical site  MSK no focal spinal tenderness, no peripheral edema  Neuro nonfocal    CBG (last 3)   Recent Labs  07/31/14 2106 08/01/14 0801 08/01/14 1215  GLUCAP 166* 127* 97     Labs:  Lab Results  Component Value Date   WBC 10.5 07/31/2014   HGB 10.0* 07/31/2014   HCT 33.8* 07/31/2014   MCV 92.9 07/31/2014   PLT 397 07/31/2014   NEUTROABS 15.4* 07/21/2014    @LASTCHEMISTRY @  Urine Studies No results for input(s): UHGB, CRYS in the last 72 hours.  Invalid input(s): UACOL, UAPR, USPG, UPH, UTP, UGL, UKET, UBIL, UNIT, UROB, [place], UEPI, UWBC, Yesenia Lyons, [place]  Basic Metabolic Panel:  Recent Labs Lab 07/28/14 0534 07/29/14 0423 07/30/14 0445 07/31/14 0555 08/01/14 0544  NA 147* 149* 143 141 143  K 3.2* 3.6 4.5 3.8 3.9  CL 113* 113* 107 105 103  CO2 26 31 29 30  32  GLUCOSE 98 165* 142* 141* 149*  BUN 5* 5* 6 7 8   CREATININE 0.95 1.00 0.96 0.89 0.91  CALCIUM 8.1* 8.0* 7.7* 8.1* 8.3*   GFR Estimated Creatinine Clearance: 95.5 mL/min (by C-G formula based on Cr of 0.91). Liver Function Tests:  Recent Labs Lab 07/28/14 0534 07/29/14 0423 07/30/14 0445 07/31/14 0555 08/01/14 0544  AST 30 24 27 29  38  ALT 11* 12* 10* 13* 16  ALKPHOS  608* 561* 636* 707* 742*  BILITOT 1.2 0.9 1.4* 0.6 0.7  PROT 7.2 7.4 6.9 7.2 7.0  ALBUMIN 2.4* 2.3* 2.1* 2.1* 2.2*   No results for input(s): LIPASE, AMYLASE in the last 168 hours. No results for input(s): AMMONIA in the last 168 hours. Coagulation profile No results for input(s): INR, PROTIME in the last 168 hours.  CBC:  Recent Labs Lab 07/27/14 0345 07/28/14 0534 07/29/14 0423 07/30/14 0445 07/31/14 0556  WBC 11.2* 17.0* 15.7* 11.5* 10.5  HGB 10.9* 11.2* 10.8* 10.2* 10.0*  HCT 37.1 36.3 35.8* 34.0* 33.8*  MCV 94.2 91.7 93.7 93.9 92.9  PLT 348 355 412* 439* 397   Cardiac Enzymes: No results for input(s): CKTOTAL, CKMB, CKMBINDEX, TROPONINI in the last 168 hours. BNP: Invalid input(s): POCBNP CBG:  Recent Labs Lab 07/31/14 1235 07/31/14 1641 07/31/14 2106 08/01/14 0801 08/01/14 1215  GLUCAP 199* 136* 166* 127* 97   D-Dimer No results for input(s): DDIMER in the last 72 hours. Hgb A1c No results for input(s): HGBA1C in the last 72 hours. Lipid Profile No results for input(s): CHOL, HDL, LDLCALC, TRIG, CHOLHDL, LDLDIRECT in the last 72 hours. Thyroid function studies No results for input(s): TSH, T4TOTAL, T3FREE, THYROIDAB in the  last 72 hours.  Invalid input(s): FREET3 Anemia work up No results for input(s): VITAMINB12, FOLATE, FERRITIN, TIBC, IRON, RETICCTPCT in the last 72 hours. Microbiology Recent Results (from the past 240 hour(s))  Surgical pcr screen     Status: None   Collection Time: 07/25/14  7:58 PM  Result Value Ref Range Status   MRSA, PCR NEGATIVE NEGATIVE Final   Staphylococcus aureus NEGATIVE NEGATIVE Final    Comment:        The Xpert SA Assay (FDA approved for NASAL specimens in patients over 68 years of age), is one component of a comprehensive surveillance program.  Test performance has been validated by [facility] for patients greater than or equal to 70 year old. It is not intended to diagnose infection nor to guide or  monitor treatment.   Clostridium Difficile by PCR (not at [facility])     Status: None   Collection Time: 07/26/14  8:51 AM  Result Value Ref Range Status   C difficile by pcr NEGATIVE NEGATIVE Final   Pathology report: 07/26/2014 Gallbladder - CHRONIC CHOLECYSTITIS. - LYMPH NODE WITH METASTATIC CARCINOMA. - SEE MICROSCOPIC DESCRIPTION. Microscopic Comment There is a lymph node attached to the neck of the gallbladder which contains metastatic carcinoma with extra nodal extension. 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   Studies:  No results found.  Assessment: 64 y.o.  1.  Chronic cholecystitis,  Status post cholecystectomy 2. 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 Acute encephalopathy, improved 4. Hypertension  5. Diabetes mellitus 6. AKI, resolving 7. Leukocytosis secondary to sepsis 8. Fever secondary to sepsis  9. Chronic diastolic CHF   Plan: - her surgical path was reviewed and discussed with patient - she is likely going home with oral antibiotics soon - I'll see her back in my clinic on August 12 , which is scheduled.  Truitt Merle, MD 08/01/2014  4:46 PM

## 2014-08-01 NOTE — Progress Notes (Signed)
6 Days Post-Op  Subjective: She looks fine has no complaints, numbers are better she is eating, no complaints of pain.  She still has cloudy drainage from her JP and I think it is more cloudy than last week.  Objective: Vital signs in last 24 hours: Temp:  [97.4 F (36.3 C)-98.2 F (36.8 C)] 98 F (36.7 C) (07/25 0609) Pulse Rate:  [56-72] 61 (07/25 0609) Resp:  [18] 18 (07/25 0609) BP: (139-159)/(64-75) 139/68 mmHg (07/25 0609) SpO2:  [97 %-98 %] 98 % (07/25 0609) Last BM Date: 08/01/14 1960 PO + BM x 3 Drain 220  Afebrile, VSS ALK phos continues to rise; other LFT's are normal No films Intake/Output from previous day: 07/24 0701 - 07/25 0700 In: 2360 [P.O.:1960; IV Piggyback:400] Out: 1660 [Urine:5375; Drains:220] Intake/Output this shift: Total I/O In: -  Out: 300 [Urine:300]  General appearance: alert, cooperative and no distress GI: soft, non-tender; bowel sounds normal; no masses,  no organomegaly and drainage is serous yellow but very cloudy.    Port sites all look fine.  Lab Results:   Recent Labs  07/30/14 0445 07/31/14 0556  WBC 11.5* 10.5  HGB 10.2* 10.0*  HCT 34.0* 33.8*  PLT 439* 397    BMET  Recent Labs  07/31/14 0555 08/01/14 0544  NA 141 143  K 3.8 3.9  CL 105 103  CO2 30 32  GLUCOSE 141* 149*  BUN 7 8  CREATININE 0.89 0.91  CALCIUM 8.1* 8.3*   PT/INR No results for input(s): LABPROT, INR in the last 72 hours.   Recent Labs Lab 07/28/14 0534 07/29/14 0423 07/30/14 0445 07/31/14 0555 08/01/14 0544  AST _0 38  ALT 11* 12* 10* 13* 16  ALKPHOS 608* 561* 636* 707* 742*  BILITOT 1.2 0.9 1.4* 0.6 0.7  PROT 7.2 7.4 6.9 7.2 7.0  ALBUMIN 2.4* 2.3* 2.1* 2.1* 2.2*     Lipase     Component Value Date/Time   LIPASE <10* 07/20/2014 2103     Studies/Results: No results found.  Medications: . feeding supplement (ENSURE ENLIVE)  237 mL Oral TID BM  . furosemide  40 mg Oral Daily  . gabapentin  400 mg Oral TID  .  heparin subcutaneous  5,000 Units Subcutaneous 3 times per day  . imipenem-cilastatin  500 mg Intravenous Q6H  . insulin aspart  0-15 Units Subcutaneous TID WC  . insulin aspart  0-5 Units Subcutaneous QHS  . insulin aspart  3 Units Subcutaneous TID WC  . insulin glargine  10 Units Subcutaneous BID  . LORazepam  1 mg Intravenous Once  . saccharomyces boulardii  250 mg Oral BID  . sodium chloride  3 mL Intravenous Q12H  . spironolactone  25 mg Oral Daily    Assessment/Plan Sepsis/cholelithiasis and cholecystitis, status post percutaneous cholecystostomy 06/09/14 Laparoscopic cholecystectomy, drain placement; 07/26/14, Dr. Excell Seltzer POD#2 Drain study 07/18/14 with patent cystic duct and CBD, ongoing filling defects. Metastatic carcinoid with tumor progression in the liver Encephalopathy - Improved Hypertension AODM Acute kidney injury Hx Chronic diastolic CHF Body mass index is 44.35  Antibiotics: 10 days of antibiotics this admit; Currently Day 5 Vancomycin - discontinued on 07/26/14. Day 11 of Imipenem-cilastatin DVT: Heparin/SCD   Plan:  Doing well from cholecystectomy, I don't know why her drainage from the Altha looks like it does.   MRCP ordered because of AKL phos. She does not fit into our MRI here.  She will have to go to [facility] in order to  perform test.   Will talk with Dr. Barry Dienes.  Currently we plan to hold off on MRI.    LOS: 11 days    JENNINGS,WILLARD 08/01/2014  Seen, agree with above.   Will d/c MRCP since drain non bilious, patient clinically well, and Alk phos is only LFT that is elevated.  T bili normalized today.     Home with drain.  Ok to leave today if OK with medicine.

## 2014-08-01 NOTE — Progress Notes (Signed)
Yesenia Lyons made aware MRCP unable to be completed at [facility].  Yesenia need to set up at [facility] MRI if procedure to be performed

## 2014-08-01 NOTE — Progress Notes (Signed)
Patient and patient daughter educated on how to clean, empty, and change dressing for JP drain.  Both stated understanding with return demonstration.  Supplies provided to family for drain maintenance.

## 2014-08-01 NOTE — Care Management Important Message (Signed)
Important Message  Patient Details  Name: Yesenia Lyons MRN: [id number] Date of Birth: October 13, 1950   Medicare Important Message Given:  Yes-fourth notification given    Camillo Flaming 08/01/2014, 1:49 [place] Message  Patient Details  Name: Yesenia Lyons MRN: [id number] Date of Birth: 07/17/1950   Medicare Important Message Given:  Yes-fourth notification given    Camillo Flaming 08/01/2014, 1:48 PM

## 2014-08-01 NOTE — Care Management Note (Signed)
Case Management Note  Patient Details  Name: Yesenia Lyons MRN: [id number] Date of Birth: Jan 25, 1950  Subjective/Objective: Patient states she is active w/[facility]. Her daughter already knows how to empty drain.PT recc HHPT.Tim rep for [employer] aware of HHPT.                    Action/Plan:d/c plan home w/HHPT.No further d/c needs.   Expected Discharge Date:                  Expected Discharge Plan:  [place]  In-House Referral:     Discharge planning Services  CM Consult  Post Acute Care Choice:  Home Health ([place]) Choice offered to:  Patient  DME Arranged:    DME Agency:     HH Arranged:    [place] Agency:     Status of Service:  In process, will continue to follow  Medicare Important Message Given:  Yes-third notification given Date Medicare IM Given:    Medicare IM give by:    Date Additional Medicare IM Given:    Additional Medicare Important Message give by:     If discussed at [place] of Stay Meetings, dates discussed:    Additional Comments:  Dessa Phi, RN 08/01/2014, 12:49 PM

## 2014-08-01 NOTE — Discharge Instructions (Signed)
Laparoscopic Cholecystectomy, Care After °Refer to this sheet in the next few weeks. These instructions provide you with information on caring for yourself after your procedure. Your health care provider may also give you more specific instructions. Your treatment has been planned according to current medical practices, but problems sometimes occur. Call your health care provider if you have any problems or questions after your procedure. °WHAT TO EXPECT AFTER THE PROCEDURE °After your procedure, it is typical to have the following: °· Pain at your incision sites. You will be given pain medicines to control the pain. °· Mild nausea or vomiting. This should improve after the first 24 hours. °· Bloating and possibly shoulder pain from the gas used during the procedure. This will improve after the first 24 hours. °HOME CARE INSTRUCTIONS  °· Change bandages (dressings) as directed by your health care provider. °· Keep the wound dry and clean. You may wash the wound gently with soap and water. Gently blot or dab the area dry. °· Do not take baths or use swimming pools or hot tubs for 2 weeks or until your health care provider approves. °· Only take over-the-counter or prescription medicines as directed by your health care provider. °· Continue your normal diet as directed by your health care provider. °· Do not lift anything heavier than 10 pounds (4.5 kg) until your health care provider approves. °· Do not play contact sports for 1 week or until your health care provider approves. °SEEK MEDICAL CARE IF:  °· You have redness, swelling, or increasing pain in the wound. °· You notice yellowish-white fluid (pus) coming from the wound. °· You have drainage from the wound that lasts longer than 1 day. °· You notice a bad smell coming from the wound or dressing. °· Your surgical cuts (incisions) break open. °SEEK IMMEDIATE MEDICAL CARE IF:  °· You develop a rash. °· You have difficulty breathing. °· You have chest pain. °· You  have a fever. °· You have increasing pain in the shoulders (shoulder strap areas). °· You have dizzy episodes or faint while standing. °· You have severe abdominal pain. °· You feel sick to your stomach (nauseous) or throw up (vomit) and this lasts for more than 1 day. °Document Released: 12/24/2004 Document Revised: 10/14/2012 Document Reviewed: 08/05/2012 °ExitCare® Patient Information ©2015 [employer]. This information is not intended to replace advice given to you by your health care provider. Make sure you discuss any questions you have with your health care provider. ° °CCS ______[facility] °LAPAROSCOPIC SURGERY: POST OP INSTRUCTIONS °Always review your discharge instruction sheet given to you by the facility where your surgery was performed. °IF YOU HAVE DISABILITY OR FAMILY LEAVE FORMS, YOU MUST BRING THEM TO THE OFFICE FOR PROCESSING.   °DO NOT GIVE THEM TO YOUR DOCTOR. ° °1. A prescription for pain medication may be given to you upon discharge.  Take your pain medication as prescribed, if needed.  If narcotic pain medicine is not needed, then you may take acetaminophen (Tylenol) or ibuprofen (Advil) as needed. °2. Take your usually prescribed medications unless otherwise directed. °3. If you need a refill on your pain medication, please contact your pharmacy.  They will contact our office to request authorization. Prescriptions will not be filled after 5pm or on week-ends. °4. You should follow a light diet the first few days after arrival home, such as soup and crackers, etc.  Be sure to include lots of fluids daily. °5. Most patients will experience some   swelling and bruising in the area of the incisions.  Ice packs will help.  Swelling and bruising can take several days to resolve.  6. It is common to experience some constipation if taking pain medication after surgery.  Increasing fluid intake and taking a stool softener (such as Colace) will usually help or prevent this problem  from occurring.  A mild laxative (Milk of Magnesia or Miralax) should be taken according to package instructions if there are no bowel movements after 48 hours. 7. Unless discharge instructions indicate otherwise, you may remove your bandages 24-48 hours after surgery, and you may shower at that time.  You may have steri-strips (small skin tapes) in place directly over the incision.  These strips should be left on the skin for 7-10 days.  If your surgeon used skin glue on the incision, you may shower in 24 hours.  The glue will flake off over the next 2-3 weeks.  Any sutures or staples will be removed at the office during your follow-up visit. 8. ACTIVITIES:  You may resume regular (light) daily activities beginning the next day--such as daily self-care, walking, climbing stairs--gradually increasing activities as tolerated.  You may have sexual intercourse when it is comfortable.  Refrain from any heavy lifting or straining until approved by your doctor. a. You may drive when you are no longer taking prescription pain medication, you can comfortably wear a seatbelt, and you can safely maneuver your car and apply brakes. b. RETURN TO WORK:  __________________________________________________________ 9. You should see your doctor in the office for a follow-up appointment approximately 2-3 weeks after your surgery.  Make sure that you call for this appointment within a day or two after you arrive home to insure a convenient appointment time. 10. OTHER INSTRUCTIONS: __________________________________________________________________________________________________________________________ __________________________________________________________________________________________________________________________ WHEN TO CALL YOUR DOCTOR: 1. Fever over 101.0 2. Inability to urinate 3. Continued bleeding from incision. 4. Increased pain, redness, or drainage from the incision. 5. Increasing abdominal pain  The  clinic staff is available to answer your questions during regular business hours.  Please dont hesitate to call and ask to speak to one of the nurses for clinical concerns.  If you have a medical emergency, go to the nearest emergency room or call 911.  A surgeon from [facility] is always on call at the hospital. [address] [place], [place], [place]  [address] ? [address], [place], [place]   [address] [phone] ? [phone] ? FAX [phone] Web site: [email or web address]  Drain Care:  Keep site clean and dry, measure drainage and record each day.  Call if there is a change or problem with the site.

## 2014-08-18 ENCOUNTER — Telehealth: Payer: Self-pay | Admitting: Hematology

## 2014-08-18 NOTE — Telephone Encounter (Signed)
pt daugher [name] cld to r/sa appt-gave r/s time & date

## 2014-08-19 ENCOUNTER — Ambulatory Visit: Payer: Medicare Other | Admitting: Hematology

## 2014-08-19 ENCOUNTER — Ambulatory Visit: Payer: Medicare Other

## 2014-08-19 ENCOUNTER — Other Ambulatory Visit: Payer: Medicare Other

## 2014-08-22 ENCOUNTER — Other Ambulatory Visit: Payer: Medicare Other

## 2014-08-22 ENCOUNTER — Telehealth: Payer: Self-pay | Admitting: *Deleted

## 2014-08-22 NOTE — Telephone Encounter (Signed)
Pt was NO SHOW for lab today 08/22/14.   Called pt at home and left message on voice mail informing pt to come in for lab at 0800, office with Dr. Burr Medico at 0830, and injection after office visit.

## 2014-08-23 ENCOUNTER — Telehealth: Payer: Self-pay | Admitting: Hematology

## 2014-08-23 ENCOUNTER — Encounter: Payer: Self-pay | Admitting: Hematology

## 2014-08-23 ENCOUNTER — Ambulatory Visit (HOSPITAL_BASED_OUTPATIENT_CLINIC_OR_DEPARTMENT_OTHER): Payer: Medicare Other | Admitting: Hematology

## 2014-08-23 ENCOUNTER — Ambulatory Visit: Payer: Medicare Other

## 2014-08-23 ENCOUNTER — Other Ambulatory Visit (HOSPITAL_BASED_OUTPATIENT_CLINIC_OR_DEPARTMENT_OTHER): Payer: Medicare Other

## 2014-08-23 VITALS — BP 143/73 | HR 75 | Temp 98.2°F | Resp 18 | Ht 70.0 in | Wt 305.5 lb

## 2014-08-23 DIAGNOSIS — R630 Anorexia: Secondary | ICD-10-CM

## 2014-08-23 DIAGNOSIS — R599 Enlarged lymph nodes, unspecified: Secondary | ICD-10-CM

## 2014-08-23 DIAGNOSIS — I10 Essential (primary) hypertension: Secondary | ICD-10-CM

## 2014-08-23 DIAGNOSIS — C7A00 Malignant carcinoid tumor of unspecified site: Secondary | ICD-10-CM

## 2014-08-23 DIAGNOSIS — R17 Unspecified jaundice: Secondary | ICD-10-CM

## 2014-08-23 DIAGNOSIS — C7B09 Secondary carcinoid tumors of other sites: Secondary | ICD-10-CM

## 2014-08-23 DIAGNOSIS — C7B02 Secondary carcinoid tumors of liver: Secondary | ICD-10-CM | POA: Diagnosis not present

## 2014-08-23 DIAGNOSIS — I509 Heart failure, unspecified: Secondary | ICD-10-CM

## 2014-08-23 DIAGNOSIS — E1165 Type 2 diabetes mellitus with hyperglycemia: Secondary | ICD-10-CM

## 2014-08-23 DIAGNOSIS — C7B00 Secondary carcinoid tumors, unspecified site: Secondary | ICD-10-CM | POA: Insufficient documentation

## 2014-08-23 LAB — CBC WITH DIFFERENTIAL/PLATELET
BASO%: 0.8 % (ref 0.0–2.0)
BASOS ABS: 0.1 10*3/uL (ref 0.0–0.1)
EOS ABS: 0.1 10*3/uL (ref 0.0–0.5)
EOS%: 0.8 % (ref 0.0–7.0)
HEMATOCRIT: 36.9 % (ref 34.8–46.6)
HGB: 11.5 g/dL — ABNORMAL LOW (ref 11.6–15.9)
LYMPH#: 1.3 10*3/uL (ref 0.9–3.3)
LYMPH%: 14.5 % (ref 14.0–49.7)
MCH: 28.2 pg (ref 25.1–34.0)
MCHC: 31.3 g/dL — AB (ref 31.5–36.0)
MCV: 89.9 fL (ref 79.5–101.0)
MONO#: 1.2 10*3/uL — AB (ref 0.1–0.9)
MONO%: 13.8 % (ref 0.0–14.0)
NEUT#: 6.3 10*3/uL (ref 1.5–6.5)
NEUT%: 70.1 % (ref 38.4–76.8)
PLATELETS: 191 10*3/uL (ref 145–400)
RBC: 4.1 10*6/uL (ref 3.70–5.45)
RDW: 15.9 % — ABNORMAL HIGH (ref 11.2–14.5)
WBC: 9 10*3/uL (ref 3.9–10.3)

## 2014-08-23 LAB — COMPREHENSIVE METABOLIC PANEL (CC13)
ALT: 31 U/L (ref 0–55)
ANION GAP: 10 meq/L (ref 3–11)
AST: 47 U/L — ABNORMAL HIGH (ref 5–34)
Albumin: 2.6 g/dL — ABNORMAL LOW (ref 3.5–5.0)
Alkaline Phosphatase: 694 U/L — ABNORMAL HIGH (ref 40–150)
BUN: 11.9 mg/dL (ref 7.0–26.0)
CHLORIDE: 103 meq/L (ref 98–109)
CO2: 25 mEq/L (ref 22–29)
Calcium: 8.7 mg/dL (ref 8.4–10.4)
Creatinine: 1.2 mg/dL — ABNORMAL HIGH (ref 0.6–1.1)
EGFR: 53 mL/min/{1.73_m2} — ABNORMAL LOW (ref >90)
Glucose: 243 mg/dl — ABNORMAL HIGH (ref 70–140)
POTASSIUM: 3.5 meq/L (ref 3.5–5.1)
Sodium: 139 mEq/L (ref 136–145)
Total Bilirubin: 2.08 mg/dL — ABNORMAL HIGH (ref 0.20–1.20)
Total Protein: 7.4 g/dL (ref 6.4–8.3)

## 2014-08-23 MED ORDER — MIRTAZAPINE 15 MG PO TABS: 15.0000 mg | ORAL_TABLET | Freq: Every day | ORAL | Status: AC

## 2014-08-23 NOTE — Telephone Encounter (Signed)
Gave patient/relative avs report and appointments for September. [facility] will call re [place] appointment patient/relative aware.

## 2014-08-23 NOTE — Progress Notes (Signed)
[place]  Telephone:(336) [phone] Fax:(336) [phone]  Clinic Follow up Note   Patient Care Team: Secundino Ginger, PA-C as PCP - General (Cardiology) 08/23/2014  SUMMARY OF ONCOLOGIC HISTORY:   Metastatic carcinoid tumor   08/24/2013 Initial Diagnosis Metastatic carcinoid tumor   08/24/2013 Initial Biopsy liver biopsy showed well differentiated neuroendocrine tumor (carcinoid)   08/24/2013 Imaging CT of abdomen showed a mesenteric mass 4.6 x 3.0 cm, with multiple lesions in the liver, largest 4.9 x 3.7 cm, left adrenal mass 5.4 x 4.9 cm.    08/26/2013 Miscellaneous Serum chromogranin A 33, 24-hour urine 5 HIAA 29.9   09/10/2013 -  Chemotherapy Monthly Sandostatin 30 mg IM   12/27/2013 Imaging CT abdomen pelvis with contrast, limited study, difficult to compare with her prior scan, liver lesion slightly smaller.    06/03/2014 - 06/11/2014 Hospital Admission Patient was admitted for acute cholecystitis. She was treated with antibiotics, surgery was consulted, patient underwent percutaneous cholecyst colostomy drain tube placement on 06/09/2014.   07/20/2014 Imaging Interval progression of the hepatic and nodal metastasis, and left adrenal mass.   07/20/2014 - 08/01/2014 Hospital Admission Patient was admitted for worsening abdominal pain and elevated liver functions, treated for cholangitis, and underwent laparoscopic cholecystectomy on July 19    INTERVAL HISTORY: Florentine returns for follow up. I last saw her in the hospital when she was admitted for abdominal pain and fever in mid July. She finally underwent laparoscopic cholecystectomy on July 19. She was discharged home on July 20. She has recovered well and was discharged from surgical clinic. No significant abdominal pain. No fever. Her appetite is very poor, she eats almost no solid food, only some soup, and occasional ensure, she does drink adequate fluids. She appears to clock to be quite depressed, but she denies depression. She  does not move around much, she came to the clinic in a wheelchair with her daughter.  REVIEW OF SYSTEMS:   Constitutional: Denies fevers, chills or abnormal weight loss Eyes: Denies blurriness of vision Ears, nose, mouth, throat, and face: Denies mucositis or sore throat Respiratory: Denies cough, dyspnea or wheezes Cardiovascular: Denies palpitation, chest discomfort or lower extremity swelling Gastrointestinal:  Denies nausea, heartburn or change in bowel habits Skin: Denies abnormal skin rashes Lymphatics: Denies new lymphadenopathy or easy bruising Neurological:Denies numbness, tingling or new weaknesses Behavioral/Psych: Mood is stable, no new changes  All other systems were reviewed with the patient and are negative.  MEDICAL HISTORY:  Past Medical History  Diagnosis Date  . Pneumonia   . Renal disorder     renal failure, was on HD for a period of time  . Obesity   . Hypertension   . Diabetes mellitus   . High cholesterol   . CHF (congestive heart failure)   . 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 Abdominal hysterectomy    . Knee reconstruction, medial patellar femoral ligament    . Cholecystectomy N/A 07/26/2014    Procedure: LAPAROSCOPIC CHOLECYSTECTOMY ;  Surgeon: Excell Seltzer, MD;  Location: [facility];  Service: General;  Laterality: N/A;   Social History Narrative   Retired. Originally from [place], [place]. Retired. 1 daughter whom she lives now here in [facility]. Never smoked cigarettes. Denies history of ETOH or recreational drugs.   FAMILY HISTORY:  Patient is adopted, family history is not known, except that she has 1 sister with breast cancer.  1 daughter, 1 grandson 49 years old.     ALLERGIES:  is allergic to penicillins.  MEDICATIONS:  Current Outpatient Prescriptions  Medication Sig Dispense Refill  . albuterol (PROVENTIL HFA;VENTOLIN HFA) 108 (90 BASE) MCG/ACT inhaler  Inhale 2 puffs into the lungs every 6 (six) hours as needed for wheezing (wheezing).     Marland Kitchen amLODipine-benazepril (LOTREL) 5-10 MG per capsule Take 1 capsule by mouth daily.    . beclomethasone (QVAR) 80 MCG/ACT inhaler Inhale 1 puff into the lungs as needed (sob). breathing    . feeding supplement, ENSURE COMPLETE, (ENSURE COMPLETE) LIQD Take 237 mLs by mouth 2 (two) times daily between meals. (Patient taking differently: Take 237 mLs by mouth daily as needed (nutrition). ) 30 Bottle 0  . ferrous gluconate (FERGON) 324 MG tablet Take 324 mg by mouth 2 (two) times daily with a meal.    . furosemide (LASIX) 40 MG tablet Take 1 tablet (40 mg total) by mouth daily. 10 tablet 0  . gabapentin (NEURONTIN) 400 MG capsule Take 1 capsule (400 mg total) by mouth 3 (three) times daily. 90 capsule 0  . Ginger, Zingiber officinalis, (GINGER ROOT) 550 MG CAPS Take 1 capsule by mouth daily as needed (nutrition).     . insulin glargine (LANTUS) 100 UNIT/ML injection Inject 0.1 mLs (10 Units total) into the skin 2 (two) times daily. In the morning and at bedtime. 10 mL 11  . methocarbamol (ROBAXIN) 500 MG tablet Take 2 tablets (1,000 mg total) by mouth every 8 (eight) hours as needed for muscle spasms. 20 tablet 0  . ondansetron (ZOFRAN ODT) 4 MG disintegrating tablet Take 1 tablet (4 mg total) by mouth every 8 (eight) hours as needed for nausea or vomiting. 20 tablet 0  . oxyCODONE-acetaminophen (PERCOCET) 10-325 MG per tablet Take 1 tablet by mouth every 8 (eight) hours as needed for pain. 30 tablet 0  . saccharomyces boulardii (FLORASTOR) 250 MG capsule Take 1 capsule (250 mg total) by mouth 2 (two) times daily. 60 capsule 2  . spironolactone (ALDACTONE) 25 MG tablet Take 25 mg by mouth daily.    Marland Kitchen sulfamethoxazole-trimethoprim (BACTRIM DS,SEPTRA DS) 800-160 MG per tablet Take 1 tablet by mouth 2 (two) times daily. 6 tablet 0  . vitamin C (ASCORBIC ACID) 500 MG tablet Take 1 tablet (500 mg total) by mouth daily. 30  tablet 5   No current facility-administered medications for this visit.    PHYSICAL EXAMINATION: ECOG PERFORMANCE STATUS: 3 - Symptomatic, >50% confined to bed  Filed Vitals:   08/23/14 0852  BP: 143/73  Pulse: 75  Temp: 98.2 F (36.8 C)  Resp: 18   Filed Weights   08/23/14 0852  Weight: 305 lb 8 oz (138.574 kg)    GENERAL:alert, no distress and comfortable SKIN: skin color, texture, turgor are normal, no rashes or significant lesions EYES: normal, Conjunctiva are pink and non-injected, sclera clear OROPHARYNX:no exudate, no erythema and lips, buccal mucosa, and tongue normal  NECK: supple, thyroid normal size, non-tender, without nodularity LYMPH:  no palpable lymphadenopathy in the cervical, axillary or inguinal LUNGS: clear to auscultation and percussion with normal breathing effort HEART: regular rate & rhythm and no murmurs and no lower extremity edema ABDOMEN:abdomen soft, non-tender and normal bowel sounds. Laparoscopic surgical scar are well healed.  Musculoskeletal:no cyanosis of digits and no clubbing, (+) leg swelling  NEURO: alert & oriented x 3 with fluent speech, no focal motor/sensory deficits  LABORATORY DATA:  I have reviewed the data as  listed CBC Latest Ref Rng 08/23/2014 07/31/2014 07/30/2014  WBC 3.9 - 10.3 10e3/uL 9.0 10.5 11.5(H)  Hemoglobin 11.6 - 15.9 g/dL 11.5(L) 10.0(L) 10.2(L)  Hematocrit 34.8 - 46.6 % 36.9 33.8(L) 34.0(L)  Platelets 145 - 400 10e3/uL 191 397 439(H)   CMP Latest Ref Rng 08/23/2014 08/01/2014 07/31/2014  Glucose 70 - 140 mg/dl 243(H) 149(H) 141(H)  BUN 7.0 - 26.0 mg/dL 11.9 8 7   Creatinine 0.6 - 1.1 mg/dL 1.2(H) 0.91 0.89  Sodium 136 - 145 mEq/L 139 143 141  Potassium 3.5 - 5.1 mEq/L 3.5 3.9 3.8  Chloride 101 - 111 mmol/L - 103 105  CO2 22 - 29 mEq/L 25 32 30  Calcium 8.4 - 10.4 mg/dL 8.7 8.3(L) 8.1(L)  Total Protein 6.4 - 8.3 g/dL 7.4 7.0 7.2  Total Bilirubin 0.20 - 1.20 mg/dL 2.08(H) 0.7 0.6  Alkaline Phos 40 - 150 U/L 694(H)  742(H) 707(H)  AST 5 - 34 U/L 47(H) 38 29  ALT 0 - 55 U/L 31 16 13(L)      Chromogranin A  Status: Finalresult Visible to patient:  MyChart Nextappt: 09/07/2014 at 08:00 AM in Radiology ([facility]) Dx:  Malignant carcinoid tumor of unknown ...            Newer results are available. Click to view them now.          Ref Range 95mo ago  62mo ago  90mo ago     Chromogranin A <=15 ng/mL 123 (H) 52 (H)CM 36 (H)CM           RADIOGRAPHIC STUDIES: I have personally reviewed the radiological images as listed and agreed with the findings in the report.  CT abdomen and pelvis with contrast 07/20/2014 IMPRESSION: Interval progression of the hepatic and nodal metastatic disease. There is interval increase in the size of the left adrenal mass and mesenteric mass/lymph node.  Percutaneous transhepatic cholecystostomy. No calcified gallstone or biliary ductal dilatation.  No evidence of bowel obstruction or inflammation. Normal appendix.   ASSESSMENT & PLAN:  64 year old female, with history of morbid obese, diabetes, hypertension, congestive heart failure, who was diagnosed with metastases to liver, mesentery and left adrenal gland.  1. 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  -The overall goal of this therapy is disease control and not curative. -Her recent CT scan showed disease progression in liver, RP nodes, and left adrenal gland lesion. Her tumor marker chromogranin A has significantly increased lately also, supports disease progression -I recommend to stop Sandostatin due to disease progression. I discussed the option of second line treatment, I recommend everolimus, which is a most common second line treatment for metastatic neuroendocrine tumor. Benefits and potential side effects, which includes but not limited to, fatigue, mucositis,  abnormal liver and kidney function, CHF, cytopenia, diarrhea, etc. were explained to patient, she agrees to proceed. -Given her elevated bilirubin, I'll start her on 2.5 mg once daily, then increase to 5 mg daily on week 2, then 7.5 mg daily on week 3, if she tolerates well.. Prescription was sent to [facility] today  2. Elevated bilirubin -Her bilirubin came down to normal after her cholecystectomy. However he went to 2.08 today -I'll obtain an abdominal ultrasound to further evaluate  2. Poorly controlled Diabetes and hypertension, history of heart failure -I encourage her to monitor her BP and BG at home, and follow-up with her primary care physician.  3. Anorexia --  Continue nutrition supplement.   Plan -I sent a prescription of Everolimus to [facility] today, she will start when she received it -Abdominal ultrasound -I'll see her back in 3 weeks  All questions were answered. The patient knows to call the clinic with any problems, questions or concerns. I spent 20 minutes counseling the patient face to face. The total time spent in the appointment was 25 minutes and more than 50% was on counseling.      Truitt Merle, MD 08/23/2014 7:44 AM

## 2014-08-25 ENCOUNTER — Encounter: Payer: Self-pay | Admitting: Hematology

## 2014-08-25 MED ORDER — EVEROLIMUS 2.5 MG PO TABS: 5.0000 mg | ORAL_TABLET | Freq: Every day | ORAL | Status: DC

## 2014-08-26 ENCOUNTER — Telehealth: Payer: Self-pay | Admitting: *Deleted

## 2014-08-26 ENCOUNTER — Encounter: Payer: Self-pay | Admitting: Hematology

## 2014-08-26 LAB — CHROMOGRANIN A: CHROMOGRANIN A: 98 ng/mL — AB (ref <=15)

## 2014-08-26 NOTE — Progress Notes (Signed)
Faxed afinitor pa form to [facility]

## 2014-08-26 NOTE — Telephone Encounter (Signed)
Prior auth for afinitor placed in Raquel's inbox/Managed Care.

## 2014-08-29 ENCOUNTER — Telehealth: Payer: Self-pay | Admitting: *Deleted

## 2014-08-29 DIAGNOSIS — C7B00 Secondary carcinoid tumors, unspecified site: Secondary | ICD-10-CM

## 2014-08-29 NOTE — Telephone Encounter (Signed)
Please see my note "I'll start her on 2.5 mg once daily for one week,  then increase to 5 mg daily on week 2, then 7.5 mg daily from week 3, if she tolerates well"  Truitt Merle

## 2014-08-29 NOTE — Telephone Encounter (Signed)
"  Ena with [facility] calling to verify directions for Afinitor 2.5 mg tablets.  Please call [phone] ext. [phone].

## 2014-08-30 ENCOUNTER — Encounter: Payer: Self-pay | Admitting: Hematology

## 2014-08-30 MED ORDER — EVEROLIMUS 2.5 MG PO TABS: ORAL_TABLET | ORAL | Status: DC

## 2014-08-30 NOTE — Telephone Encounter (Signed)
Called Ena and Dr. Ernestina Penna information provided on [facility] [employer].  Will rid order of note which differs from automatic order instructions that appears as a dual sig for this medication order for future orders.

## 2014-08-30 NOTE — Progress Notes (Signed)
Per Analicia at [facility] is already an approval for afinitor 08/26/14-08/26/15== pa# [id number]

## 2014-09-07 ENCOUNTER — Encounter: Payer: Self-pay | Admitting: Skilled Nursing Facility1

## 2014-09-07 ENCOUNTER — Ambulatory Visit (HOSPITAL_COMMUNITY): Payer: Medicare Other

## 2014-09-07 NOTE — Progress Notes (Signed)
Subjective:     Patient ID: Yesenia Lyons, female   DOB: Jul 02, 1950, 64 y.o.   MRN: [id number]  HPI   Review of Systems     Objective:   Physical Exam To assist the pt in identifying dietary strategies to gain some lost wt back.    Assessment:     Pt identified as being malnourished due to losing some wt. Pt was contacted via the telephone at [phone]. Pt states she has started taking medication to increase her appetite and it has. Pt states she has been forcing herself to eat.    Plan:     Dietitian first called [phone] which was her daughters number so her daughter gave the pts number above. Dietitian offered positive reinforcement for the pt and suggested she try smaller meals throughout the day as well as cold liquids such as smoothies and milkshakes.  Dietitian offered Ernestene Kiel CSO,RD,LDN number in case she had any [phone].

## 2014-09-09 ENCOUNTER — Other Ambulatory Visit: Payer: Medicare Other

## 2014-09-09 ENCOUNTER — Encounter: Payer: Self-pay | Admitting: Hematology

## 2014-09-09 ENCOUNTER — Encounter: Payer: Medicare Other | Admitting: Hematology

## 2014-09-09 NOTE — Progress Notes (Signed)
This encounter was created in error - please disregard.

## 2014-09-13 ENCOUNTER — Telehealth: Payer: Self-pay | Admitting: Hematology

## 2014-09-13 NOTE — Telephone Encounter (Signed)
pt daughter cld to r/s appt-gave r/s appt and gave r/s time & date

## 2014-09-19 ENCOUNTER — Ambulatory Visit (HOSPITAL_COMMUNITY)
Admission: RE | Admit: 2014-09-19 | Discharge: 2014-09-19 | Disposition: A | Discharge status: Home / Self Care | Payer: Medicare Other | Source: Ambulatory Visit | Attending: Hematology | Admitting: Hematology

## 2014-09-19 DIAGNOSIS — C787 Secondary malignant neoplasm of liver and intrahepatic bile duct: Secondary | ICD-10-CM | POA: Insufficient documentation

## 2014-09-19 DIAGNOSIS — R748 Abnormal levels of other serum enzymes: Secondary | ICD-10-CM | POA: Insufficient documentation

## 2014-09-19 DIAGNOSIS — C7B00 Secondary carcinoid tumors, unspecified site: Secondary | ICD-10-CM

## 2014-09-29 ENCOUNTER — Encounter: Payer: Self-pay | Admitting: Hematology

## 2014-09-29 ENCOUNTER — Other Ambulatory Visit (HOSPITAL_BASED_OUTPATIENT_CLINIC_OR_DEPARTMENT_OTHER): Payer: Medicare Other

## 2014-09-29 ENCOUNTER — Telehealth: Payer: Self-pay | Admitting: Hematology

## 2014-09-29 ENCOUNTER — Ambulatory Visit (HOSPITAL_BASED_OUTPATIENT_CLINIC_OR_DEPARTMENT_OTHER): Payer: Medicare Other | Admitting: Hematology

## 2014-09-29 VITALS — BP 163/85 | HR 87 | Temp 97.7°F | Resp 17 | Ht 70.0 in | Wt 308.4 lb

## 2014-09-29 DIAGNOSIS — C7B02 Secondary carcinoid tumors of liver: Secondary | ICD-10-CM | POA: Diagnosis not present

## 2014-09-29 DIAGNOSIS — C7B00 Secondary carcinoid tumors, unspecified site: Secondary | ICD-10-CM

## 2014-09-29 DIAGNOSIS — C7B09 Secondary carcinoid tumors of other sites: Secondary | ICD-10-CM

## 2014-09-29 DIAGNOSIS — C7A00 Malignant carcinoid tumor of unspecified site: Secondary | ICD-10-CM | POA: Diagnosis not present

## 2014-09-29 LAB — CBC WITH DIFFERENTIAL/PLATELET
BASO%: 0.2 % (ref 0.0–2.0)
BASOS ABS: 0 10*3/uL (ref 0.0–0.1)
EOS%: 1.2 % (ref 0.0–7.0)
Eosinophils Absolute: 0.1 10*3/uL (ref 0.0–0.5)
HEMATOCRIT: 35 % (ref 34.8–46.6)
HGB: 11.5 g/dL — ABNORMAL LOW (ref 11.6–15.9)
LYMPH#: 1.2 10*3/uL (ref 0.9–3.3)
LYMPH%: 25.2 % (ref 14.0–49.7)
MCH: 27.8 pg (ref 25.1–34.0)
MCHC: 32.9 g/dL (ref 31.5–36.0)
MCV: 84.5 fL (ref 79.5–101.0)
MONO#: 0.6 10*3/uL (ref 0.1–0.9)
MONO%: 11.2 % (ref 0.0–14.0)
NEUT#: 3 10*3/uL (ref 1.5–6.5)
NEUT%: 62.2 % (ref 38.4–76.8)
Platelets: 144 10*3/uL — ABNORMAL LOW (ref 145–400)
RBC: 4.14 10*6/uL (ref 3.70–5.45)
RDW: 13.3 % (ref 11.2–14.5)
WBC: 4.9 10*3/uL (ref 3.9–10.3)

## 2014-09-29 LAB — COMPREHENSIVE METABOLIC PANEL (CC13)
ALT: 41 U/L (ref 0–55)
ANION GAP: 8 meq/L (ref 3–11)
AST: 54 U/L — AB (ref 5–34)
Albumin: 3.1 g/dL — ABNORMAL LOW (ref 3.5–5.0)
Alkaline Phosphatase: 558 U/L — ABNORMAL HIGH (ref 40–150)
BUN: 11.6 mg/dL (ref 7.0–26.0)
CALCIUM: 9.4 mg/dL (ref 8.4–10.4)
CHLORIDE: 109 meq/L (ref 98–109)
CO2: 24 meq/L (ref 22–29)
Creatinine: 0.9 mg/dL (ref 0.6–1.1)
EGFR: 76 mL/min/{1.73_m2} — ABNORMAL LOW (ref >90)
Glucose: 165 mg/dl — ABNORMAL HIGH (ref 70–140)
POTASSIUM: 3.6 meq/L (ref 3.5–5.1)
Sodium: 141 mEq/L (ref 136–145)
Total Bilirubin: 0.94 mg/dL (ref 0.20–1.20)
Total Protein: 7.9 g/dL (ref 6.4–8.3)

## 2014-09-29 NOTE — Progress Notes (Signed)
[place]  Telephone:(336) [phone] Fax:(336) [phone]  Clinic Follow up Note   Patient Care Team: Secundino Ginger, PA-C as PCP - General (Cardiology) 09/29/2014  SUMMARY OF ONCOLOGIC HISTORY:   Metastatic carcinoid tumor   08/24/2013 Initial Diagnosis Metastatic carcinoid tumor   08/24/2013 Initial Biopsy liver biopsy showed well differentiated neuroendocrine tumor (carcinoid)   08/24/2013 Imaging CT of abdomen showed a mesenteric mass 4.6 x 3.0 cm, with multiple lesions in the liver, largest 4.9 x 3.7 cm, left adrenal mass 5.4 x 4.9 cm.    08/26/2013 Miscellaneous Serum chromogranin A 33, 24-hour urine 5 HIAA 29.9   09/10/2013 -  Chemotherapy Monthly Sandostatin 30 mg IM   12/27/2013 Imaging CT abdomen pelvis with contrast, limited study, difficult to compare with her prior scan, liver lesion slightly smaller.    06/03/2014 - 06/11/2014 Hospital Admission Patient was admitted for acute cholecystitis. She was treated with antibiotics, surgery was consulted, patient underwent percutaneous cholecyst colostomy drain tube placement on 06/09/2014.   07/20/2014 Imaging Interval progression of the hepatic and nodal metastasis, and left adrenal mass.   07/20/2014 - 08/01/2014 Hospital Admission Patient was admitted for worsening abdominal pain and elevated liver functions, treated for cholangitis, and underwent laparoscopic cholecystectomy on July 19    INTERVAL HISTORY: Sadeen returns for follow up. She started everolimus about 4 weeks ago, and gradually increased the dose to 7.5mg  daily, her main complains is diarrhea, she goes to bathroom every a few house, she has had it since her gall bladder surgery, she has not use any imodium, her appetite is fair, but she has bit in mouth and she does not want to eat. She drinks Ensure 2 a day. No fever or chills. Her energy level is still very low, she is not very active at home, comes in a wheelchair today. No other complains. She is tolerating  everolimus well overall.    REVIEW OF SYSTEMS:   Constitutional: Denies fevers, chills or abnormal weight loss Eyes: Denies blurriness of vision Ears, nose, mouth, throat, and face: Denies mucositis or sore throat Respiratory: Denies cough, dyspnea or wheezes Cardiovascular: Denies palpitation, chest discomfort or lower extremity swelling Gastrointestinal:  Denies nausea, heartburn or change in bowel habits Skin: Denies abnormal skin rashes Lymphatics: Denies new lymphadenopathy or easy bruising Neurological:Denies numbness, tingling or new weaknesses Behavioral/Psych: Mood is stable, no new changes  All other systems were reviewed with the patient and are negative.  MEDICAL HISTORY:  Past Medical History  Diagnosis Date  . Pneumonia   . Renal disorder     renal failure, was on HD for a period of time  . Obesity   . Hypertension   . Diabetes mellitus   . High cholesterol   . CHF (congestive heart failure)   . 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 Abdominal hysterectomy    . Knee reconstruction, medial patellar femoral ligament    . Cholecystectomy N/A 07/26/2014    Procedure: LAPAROSCOPIC CHOLECYSTECTOMY ;  Surgeon: Excell Seltzer, MD;  Location: [facility];  Service: General;  Laterality: N/A;   Social History Narrative   Retired. Originally from [place], [place]. Retired. 1 daughter whom she lives now here in [facility]. Never smoked cigarettes. Denies history of ETOH or recreational drugs.   FAMILY HISTORY:  Patient is adopted, family history is not known, except that she has 1 sister with breast cancer. 1  daughter, 40 grandson 65 years old.     ALLERGIES:  is allergic to penicillins.  MEDICATIONS:  Current Outpatient Prescriptions  Medication Sig Dispense Refill  . albuterol (PROVENTIL HFA;VENTOLIN HFA) 108 (90 BASE) MCG/ACT inhaler Inhale 2 puffs into the lungs every 6 (six) hours as needed  for wheezing (wheezing).     Marland Kitchen amLODipine-benazepril (LOTREL) 5-10 MG per capsule Take 1 capsule by mouth daily.    . beclomethasone (QVAR) 80 MCG/ACT inhaler Inhale 1 puff into the lungs as needed (sob). breathing    . everolimus (AFINITOR) 2.5 MG tablet Take 2.5mg  daily for 7 days, then increase to 5mg  daily for 7 days, then increase to 7.5mg  daily. Caution:chemotherapy. 80 tablet 0  . feeding supplement, ENSURE COMPLETE, (ENSURE COMPLETE) LIQD Take 237 mLs by mouth 2 (two) times daily between meals. (Patient taking differently: Take 237 mLs by mouth daily as needed (nutrition). ) 30 Bottle 0  . ferrous gluconate (FERGON) 324 MG tablet Take 324 mg by mouth 2 (two) times daily with a meal.    . furosemide (LASIX) 40 MG tablet Take 1 tablet (40 mg total) by mouth daily. 10 tablet 0  . gabapentin (NEURONTIN) 400 MG capsule Take 1 capsule (400 mg total) by mouth 3 (three) times daily. 90 capsule 0  . Ginger, Zingiber officinalis, (GINGER ROOT) 550 MG CAPS Take 1 capsule by mouth daily as needed (nutrition).     . insulin glargine (LANTUS) 100 UNIT/ML injection Inject 0.1 mLs (10 Units total) into the skin 2 (two) times daily. In the morning and at bedtime. 10 mL 11  . methocarbamol (ROBAXIN) 500 MG tablet Take 2 tablets (1,000 mg total) by mouth every 8 (eight) hours as needed for muscle spasms. 20 tablet 0  . mirtazapine (REMERON) 15 MG tablet Take 1 tablet (15 mg total) by mouth at bedtime. 30 tablet 2  . ondansetron (ZOFRAN ODT) 4 MG disintegrating tablet Take 1 tablet (4 mg total) by mouth every 8 (eight) hours as needed for nausea or vomiting. 20 tablet 0  . oxyCODONE-acetaminophen (PERCOCET) 10-325 MG per tablet Take 1 tablet by mouth every 8 (eight) hours as needed for pain. 30 tablet 0  . saccharomyces boulardii (FLORASTOR) 250 MG capsule Take 1 capsule (250 mg total) by mouth 2 (two) times daily. 60 capsule 2  . spironolactone (ALDACTONE) 25 MG tablet Take 25 mg by mouth daily.    . vitamin C  (ASCORBIC ACID) 500 MG tablet Take 1 tablet (500 mg total) by mouth daily. 30 tablet 5   No current facility-administered medications for this visit.    PHYSICAL EXAMINATION: ECOG PERFORMANCE STATUS: 3 - Symptomatic, >50% confined to bed  Filed Vitals:   09/29/14 0855  BP: 163/85  Pulse: 87  Temp: 97.7 F (36.5 C)  Resp: 17   Filed Weights   09/29/14 0855  Weight: 308 lb 6.4 oz (139.889 kg)    GENERAL:alert, no distress and comfortable SKIN: skin color, texture, turgor are normal, no rashes or significant lesions EYES: normal, Conjunctiva are pink and non-injected, sclera clear OROPHARYNX:no exudate, no erythema and lips, buccal mucosa, and tongue normal  NECK: supple, thyroid normal size, non-tender, without nodularity LYMPH:  no palpable lymphadenopathy in the cervical, axillary or inguinal LUNGS: clear to auscultation and percussion with normal breathing effort HEART: regular rate & rhythm and no murmurs and no lower extremity edema ABDOMEN:abdomen soft, non-tender and normal bowel sounds. Laparoscopic surgical scar are well healed.  Musculoskeletal:no cyanosis of digits and no clubbing, (+)  leg swelling  NEURO: alert & oriented x 3 with fluent speech, no focal motor/sensory deficits  LABORATORY DATA:  I have reviewed the data as listed CBC Latest Ref Rng 09/29/2014 08/23/2014 07/31/2014  WBC 3.9 - 10.3 10e3/uL 4.9 9.0 10.5  Hemoglobin 11.6 - 15.9 g/dL 11.5(L) 11.5(L) 10.0(L)  Hematocrit 34.8 - 46.6 % 35.0 36.9 33.8(L)  Platelets 145 - 400 10e3/uL 144(L) 191 397   CMP Latest Ref Rng 09/29/2014 08/23/2014 08/01/2014  Glucose 70 - 140 mg/dl 165(H) 243(H) 149(H)  BUN 7.0 - 26.0 mg/dL 11.6 11.9 8  Creatinine 0.6 - 1.1 mg/dL 0.9 1.2(H) 0.91  Sodium 136 - 145 mEq/L 141 139 143  Potassium 3.5 - 5.1 mEq/L 3.6 3.5 3.9  Chloride 101 - 111 mmol/L - - 103  CO2 22 - 29 mEq/L 24 25 32  Calcium 8.4 - 10.4 mg/dL 9.4 8.7 8.3(L)  Total Protein 6.4 - 8.3 g/dL 7.9 7.4 7.0  Total Bilirubin  0.20 - 1.20 mg/dL 0.94 2.08(H) 0.7  Alkaline Phos 40 - 150 U/L 558(H) 694(H) 742(H)  AST 5 - 34 U/L 54(H) 47(H) 38  ALT 0 - 55 U/L 41 31 16   Chromogranin A (Order [id number])      Chromogranin A  Status: Finalresult Visible to patient:  MyChart Nextappt: 10/31/2014 at 08:15 AM in Oncology ([facility]) Dx:  Malignant carcinoid tumor of unknown ...              Ref Range 51mo ago  34mo ago  39mo ago  9mo ago     Chromogranin A <=15 ng/mL 98 (H) 72 (H)CM 123 (H)CM 52 (H)CM          RADIOGRAPHIC STUDIES: I have personally reviewed the radiological images as listed and agreed with the findings in the report.  CT abdomen and pelvis with contrast 07/20/2014 IMPRESSION: Interval progression of the hepatic and nodal metastatic disease. There is interval increase in the size of the left adrenal mass and mesenteric mass/lymph node.  Percutaneous transhepatic cholecystostomy. No calcified gallstone or biliary ductal dilatation.  No evidence of bowel obstruction or inflammation. Normal appendix.   ASSESSMENT & PLAN:  64 year old female, with history of morbid obese, diabetes, hypertension, congestive heart failure, who was diagnosed with metastases to liver, mesentery and left adrenal gland.  1. 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  -The overall goal of this therapy is disease control and not curative. -Her recent CT scan showed disease progression in liver, RP nodes, and left adrenal gland lesion. Her tumor marker chromogranin A has significantly increased lately also, supports disease progression -She is currently on second line therapy with everolimus, tolerating 7.5mg  daily well, we'll increase to 10mg  daily   -Her hyperbilirubinemia has resolved -will repeat restaging scan in   2. Elevated bilirubin -Resolved now -[place] was negative  for biliary dilatation  3. Poorly controlled Diabetes and hypertension, history of heart failure -I encourage her to monitor her BP and BG at home, and follow-up with her primary care physician.  4. Anorexia and fatigue  -improved  --Continue nutrition supplement.   Plan -increase eveolimus to 10mg  daily, prescription was send to [facility] -I will see her back in one month   All questions were answered. The patient knows to call the clinic with any problems, questions or concerns. I spent 20 minutes counseling the patient face to face. The total time spent in the appointment was  25 minutes and more than 50% was on counseling.      Truitt Merle, MD 09/29/2014 9:15 AM

## 2014-09-29 NOTE — Telephone Encounter (Signed)
per pof to sch pt appt-gave pt cipy of avs °

## 2014-10-03 LAB — CHROMOGRANIN A: Chromogranin A: 106 ng/mL — ABNORMAL HIGH (ref <=15)

## 2014-10-03 MED ORDER — EVEROLIMUS 10 MG PO TABS: 10.0000 mg | ORAL_TABLET | Freq: Every day | ORAL | Status: AC

## 2014-10-03 MED ORDER — EVEROLIMUS 10 MG PO TABS: 10.0000 mg | ORAL_TABLET | Freq: Every day | ORAL | Status: DC

## 2014-10-03 NOTE — Addendum Note (Signed)
Addended by: Truitt Merle on: 10/03/2014 08:12 PM   Modules accepted: Orders

## 2014-10-04 ENCOUNTER — Telehealth: Payer: Self-pay | Admitting: *Deleted

## 2014-10-04 NOTE — Telephone Encounter (Signed)
Notified daughter that Dr Burr Medico wants to increase pt's everolimus dose to 10 mg daily since her liver function is normal now.  Script sent to [facility] per [name]'s request.

## 2014-10-28 ENCOUNTER — Other Ambulatory Visit: Payer: Self-pay | Admitting: *Deleted

## 2014-10-28 DIAGNOSIS — C7B00 Secondary carcinoid tumors, unspecified site: Secondary | ICD-10-CM

## 2014-10-31 ENCOUNTER — Telehealth: Payer: Self-pay | Admitting: Hematology

## 2014-10-31 ENCOUNTER — Ambulatory Visit (HOSPITAL_BASED_OUTPATIENT_CLINIC_OR_DEPARTMENT_OTHER): Payer: Medicare Other | Admitting: Hematology

## 2014-10-31 ENCOUNTER — Encounter: Payer: Self-pay | Admitting: Hematology

## 2014-10-31 ENCOUNTER — Other Ambulatory Visit: Payer: Medicare Other

## 2014-10-31 ENCOUNTER — Other Ambulatory Visit: Payer: Self-pay | Admitting: Hematology

## 2014-10-31 VITALS — BP 133/83 | HR 76 | Temp 97.8°F | Resp 18 | Ht 70.0 in | Wt 293.6 lb

## 2014-10-31 DIAGNOSIS — I509 Heart failure, unspecified: Secondary | ICD-10-CM | POA: Diagnosis not present

## 2014-10-31 DIAGNOSIS — E876 Hypokalemia: Secondary | ICD-10-CM

## 2014-10-31 DIAGNOSIS — R109 Unspecified abdominal pain: Secondary | ICD-10-CM

## 2014-10-31 DIAGNOSIS — C7B00 Secondary carcinoid tumors, unspecified site: Secondary | ICD-10-CM

## 2014-10-31 DIAGNOSIS — Z23 Encounter for immunization: Secondary | ICD-10-CM

## 2014-10-31 DIAGNOSIS — C7B09 Secondary carcinoid tumors of other sites: Secondary | ICD-10-CM

## 2014-10-31 DIAGNOSIS — E1165 Type 2 diabetes mellitus with hyperglycemia: Secondary | ICD-10-CM

## 2014-10-31 DIAGNOSIS — R197 Diarrhea, unspecified: Secondary | ICD-10-CM

## 2014-10-31 DIAGNOSIS — C7A00 Malignant carcinoid tumor of unspecified site: Secondary | ICD-10-CM | POA: Diagnosis not present

## 2014-10-31 DIAGNOSIS — R630 Anorexia: Secondary | ICD-10-CM

## 2014-10-31 DIAGNOSIS — I10 Essential (primary) hypertension: Secondary | ICD-10-CM

## 2014-10-31 DIAGNOSIS — C7B02 Secondary carcinoid tumors of liver: Secondary | ICD-10-CM

## 2014-10-31 DIAGNOSIS — R5383 Other fatigue: Secondary | ICD-10-CM

## 2014-10-31 LAB — CBC WITH DIFFERENTIAL/PLATELET
BASO%: 1.2 % (ref 0.0–2.0)
BASOS ABS: 0.1 10*3/uL (ref 0.0–0.1)
EOS%: 1.3 % (ref 0.0–7.0)
Eosinophils Absolute: 0.1 10*3/uL (ref 0.0–0.5)
HCT: 37.3 % (ref 34.8–46.6)
HEMOGLOBIN: 12.3 g/dL (ref 11.6–15.9)
LYMPH%: 19 % (ref 14.0–49.7)
MCH: 26.6 pg (ref 25.1–34.0)
MCHC: 33 g/dL (ref 31.5–36.0)
MCV: 80.5 fL (ref 79.5–101.0)
MONO#: 0.6 10*3/uL (ref 0.1–0.9)
MONO%: 10 % (ref 0.0–14.0)
NEUT#: 4.1 10*3/uL (ref 1.5–6.5)
NEUT%: 68.5 % (ref 38.4–76.8)
Platelets: 205 10*3/uL (ref 145–400)
RBC: 4.63 10*6/uL (ref 3.70–5.45)
RDW: 13.6 % (ref 11.2–14.5)
WBC: 6 10*3/uL (ref 3.9–10.3)
lymph#: 1.1 10*3/uL (ref 0.9–3.3)

## 2014-10-31 LAB — COMPREHENSIVE METABOLIC PANEL (CC13)
ALBUMIN: 3 g/dL — AB (ref 3.5–5.0)
ALT: 24 U/L (ref 0–55)
AST: 51 U/L — AB (ref 5–34)
Alkaline Phosphatase: 426 U/L — ABNORMAL HIGH (ref 40–150)
Anion Gap: 10 mEq/L (ref 3–11)
BUN: 7.4 mg/dL (ref 7.0–26.0)
CHLORIDE: 110 meq/L — AB (ref 98–109)
CO2: 27 meq/L (ref 22–29)
Calcium: 9 mg/dL (ref 8.4–10.4)
Creatinine: 1.2 mg/dL — ABNORMAL HIGH (ref 0.6–1.1)
EGFR: 57 mL/min/{1.73_m2} — ABNORMAL LOW (ref >90)
Glucose: 136 mg/dl (ref 70–140)
POTASSIUM: 3.2 meq/L — AB (ref 3.5–5.1)
SODIUM: 147 meq/L — AB (ref 136–145)
Total Bilirubin: 0.95 mg/dL (ref 0.20–1.20)
Total Protein: 7.8 g/dL (ref 6.4–8.3)

## 2014-10-31 MED ORDER — INFLUENZA VAC SPLIT QUAD 0.5 ML IM SUSY
0.5000 mL | PREFILLED_SYRINGE | Freq: Once | INTRAMUSCULAR | Status: AC
  Administered 2014-10-31: 0.5 mL via INTRAMUSCULAR
  Filled 2014-10-31: qty 0.5

## 2014-10-31 MED ORDER — OXYCODONE-ACETAMINOPHEN 10-325 MG PO TABS: 1.0000 | ORAL_TABLET | Freq: Three times a day (TID) | ORAL | Status: AC | PRN

## 2014-10-31 MED ORDER — POTASSIUM CHLORIDE CRYS ER 20 MEQ PO TBCR: 20.0000 meq | EXTENDED_RELEASE_TABLET | Freq: Every day | ORAL | Status: AC

## 2014-10-31 NOTE — Progress Notes (Signed)
[facility]  [phone] [phone]  Clinic Follow up Note   Patient Care Team: Secundino Ginger, PA-C as PCP - General (Cardiology) 10/31/2014  SUMMARY OF ONCOLOGIC HISTORY:   Metastatic carcinoid tumor ([place])   08/24/2013 Initial Diagnosis Metastatic carcinoid tumor   08/24/2013 Initial Biopsy liver biopsy showed well differentiated neuroendocrine tumor (carcinoid)   08/24/2013 Imaging CT of abdomen showed a mesenteric mass 4.6 x 3.0 cm, with multiple lesions in the liver, largest 4.9 x 3.7 cm, left adrenal mass 5.4 x 4.9 cm.    08/26/2013 Miscellaneous Serum chromogranin A 33, 24-hour urine 5 HIAA 29.9   09/10/2013 -  Chemotherapy Monthly Sandostatin 30 mg IM   12/27/2013 Imaging CT abdomen pelvis with contrast, limited study, difficult to compare with her prior scan, liver lesion slightly smaller.    06/03/2014 - 06/11/2014 Hospital Admission Patient was admitted for acute cholecystitis. She was treated with antibiotics, surgery was consulted, patient underwent percutaneous cholecyst colostomy drain tube placement on 06/09/2014.   07/20/2014 Imaging Interval progression of the hepatic and nodal metastasis, and left adrenal mass.   07/20/2014 - 08/01/2014 Hospital Admission Patient was admitted for worsening abdominal pain and elevated liver functions, treated for cholangitis, and underwent laparoscopic cholecystectomy on July 19   09/01/2014 -  Chemotherapy second line therapy with Everolimus, dose gradually incresed to 10mg  daily in one month     INTERVAL HISTORY: Yesenia Lyons returns for follow up. She has been tolerating everolimus 10mg  daily well. She still has diarrhea, she takes imodium, has BM 1-3 times a day. She occasionally has mild nausea, no vomiting. Her appetite is moderate, she has no teeth or denture, can only eat soft diet, she has lost about 30 pounds in the past 3 months. Her leg swollen are stable, no other new complaints. No fever or chills. She is  not very active at home.  REVIEW OF SYSTEMS:   Constitutional: Denies fevers, chills, (+) weight loss, (+) fatigue  Eyes: Denies blurriness of vision Ears, nose, mouth, throat, and face: Denies mucositis or sore throat Respiratory: Denies cough, dyspnea or wheezes Cardiovascular: Denies palpitation, chest discomfort or lower extremity swelling Gastrointestinal:  Denies nausea, heartburn or change in bowel habits Skin: Denies abnormal skin rashes Lymphatics: Denies new lymphadenopathy or easy bruising Neurological:Denies numbness, tingling or new weaknesses Behavioral/Psych: Mood is stable, no new changes  All other systems were reviewed with the patient and are negative.  MEDICAL HISTORY:  Past Medical History  Diagnosis Date  . Pneumonia   . Renal disorder     renal failure, was on HD for a period of time  . Obesity   . Hypertension   . Diabetes mellitus   . High cholesterol   . CHF (congestive heart failure) ([place])   . 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 Abdominal hysterectomy    . Knee reconstruction, medial patellar femoral ligament    . Cholecystectomy N/A 07/26/2014    Procedure: LAPAROSCOPIC CHOLECYSTECTOMY ;  Surgeon: Excell Seltzer, MD;  Location: [facility];  Service: General;  Laterality: N/A;   Social History Narrative   Retired. Originally from [place], [place]. Retired. 1 daughter whom she lives now here in [facility]. Never smoked cigarettes. Denies history of ETOH or recreational drugs.   FAMILY HISTORY:  Patient is adopted, family history is not known, except that she has 1 sister with breast cancer. 1 daughter,  1 grandson 62 years old.     ALLERGIES:  is allergic to penicillins.  MEDICATIONS:  Current Outpatient Prescriptions  Medication Sig Dispense Refill  . albuterol (PROVENTIL HFA;VENTOLIN HFA) 108 (90 BASE) MCG/ACT inhaler Inhale 2 puffs into the lungs every 6 (six) hours  as needed for wheezing (wheezing).     Marland Kitchen amLODipine-benazepril (LOTREL) 5-10 MG per capsule Take 1 capsule by mouth daily.    . beclomethasone (QVAR) 80 MCG/ACT inhaler Inhale 1 puff into the lungs as needed (sob). breathing    . everolimus (AFINITOR) 10 MG tablet Take 1 tablet (10 mg total) by mouth daily. 30 tablet 2  . feeding supplement, ENSURE COMPLETE, (ENSURE COMPLETE) LIQD Take 237 mLs by mouth 2 (two) times daily between meals. (Patient taking differently: Take 237 mLs by mouth daily as needed (nutrition). ) 30 Bottle 0  . ferrous gluconate (FERGON) 324 MG tablet Take 324 mg by mouth 2 (two) times daily with a meal.    . furosemide (LASIX) 40 MG tablet Take 1 tablet (40 mg total) by mouth daily. 10 tablet 0  . gabapentin (NEURONTIN) 400 MG capsule Take 1 capsule (400 mg total) by mouth 3 (three) times daily. 90 capsule 0  . Ginger, Zingiber officinalis, (GINGER ROOT) 550 MG CAPS Take 1 capsule by mouth daily as needed (nutrition).     . insulin glargine (LANTUS) 100 UNIT/ML injection Inject 0.1 mLs (10 Units total) into the skin 2 (two) times daily. In the morning and at bedtime. 10 mL 11  . methocarbamol (ROBAXIN) 500 MG tablet Take 2 tablets (1,000 mg total) by mouth every 8 (eight) hours as needed for muscle spasms. 20 tablet 0  . mirtazapine (REMERON) 15 MG tablet Take 1 tablet (15 mg total) by mouth at bedtime. 30 tablet 2  . ondansetron (ZOFRAN ODT) 4 MG disintegrating tablet Take 1 tablet (4 mg total) by mouth every 8 (eight) hours as needed for nausea or vomiting. 20 tablet 0  . oxyCODONE-acetaminophen (PERCOCET) 10-325 MG tablet Take 1 tablet by mouth every 8 (eight) hours as needed for pain. 60 tablet 0  . saccharomyces boulardii (FLORASTOR) 250 MG capsule Take 1 capsule (250 mg total) by mouth 2 (two) times daily. 60 capsule 2  . spironolactone (ALDACTONE) 25 MG tablet Take 25 mg by mouth daily.    . vitamin C (ASCORBIC ACID) 500 MG tablet Take 1 tablet (500 mg total) by mouth  daily. 30 tablet 5   No current facility-administered medications for this visit.    PHYSICAL EXAMINATION: ECOG PERFORMANCE STATUS: 3 - Symptomatic, >50% confined to bed  Filed Vitals:   10/31/14 0852  BP: 133/83  Pulse: 76  Temp: 97.8 F (36.6 C)  Resp: 18   Filed Weights   10/31/14 0852  Weight: 293 lb 9.6 oz (133.176 kg)    GENERAL:alert, no distress and comfortable SKIN: skin color, texture, turgor are normal, no rashes or significant lesions EYES: normal, Conjunctiva are pink and non-injected, sclera clear OROPHARYNX:no exudate, no erythema and lips, buccal mucosa, and tongue normal  NECK: supple, thyroid normal size, non-tender, without nodularity LYMPH:  no palpable lymphadenopathy in the cervical, axillary or inguinal LUNGS: clear to auscultation and percussion with normal breathing effort HEART: regular rate & rhythm and no murmurs and no lower extremity edema ABDOMEN:abdomen soft, non-tender and normal bowel sounds. Laparoscopic surgical scar are well healed.  Musculoskeletal:no cyanosis of digits and no clubbing, (+) leg swelling  NEURO: alert & oriented x 3 with fluent speech,  no focal motor/sensory deficits  LABORATORY DATA:  I have reviewed the data as listed CBC Latest Ref Rng 10/31/2014 09/29/2014 08/23/2014  WBC 3.9 - 10.3 10e3/uL 6.0 4.9 9.0  Hemoglobin 11.6 - 15.9 g/dL 12.3 11.5(L) 11.5(L)  Hematocrit 34.8 - 46.6 % 37.3 35.0 36.9  Platelets 145 - 400 10e3/uL 205 144(L) 191   CMP Latest Ref Rng 10/31/2014 09/29/2014 08/23/2014  Glucose 70 - 140 mg/dl 136 165(H) 243(H)  BUN 7.0 - 26.0 mg/dL 7.4 11.6 11.9  Creatinine 0.6 - 1.1 mg/dL 1.2(H) 0.9 1.2(H)  Sodium 136 - 145 mEq/L 147(H) 141 139  Potassium 3.5 - 5.1 mEq/L 3.2(L) 3.6 3.5  Chloride 101 - 111 mmol/L - - -  CO2 22 - 29 mEq/L 27 24 25   Calcium 8.4 - 10.4 mg/dL 9.0 9.4 8.7  Total Protein 6.4 - 8.3 g/dL 7.8 7.9 7.4  Total Bilirubin 0.20 - 1.20 mg/dL 0.95 0.94 2.08(H)  Alkaline Phos 40 - 150 U/L  426(H) 558(H) 694(H)  AST 5 - 34 U/L 51(H) 54(H) 47(H)  ALT 0 - 55 U/L 24 41 31   Chromogranin A (Order [id number])      Chromogranin A  Status: Finalresult Visible to patient:  MyChart Nextappt: 10/31/2014 at 08:15 AM in Oncology ([facility]) Dx:  Malignant carcinoid tumor of unknown ...              Ref Range 19mo ago  79mo ago  65mo ago  22mo ago     Chromogranin A <=15 ng/mL 98 (H) 72 (H)CM 123 (H)CM 52 (H)CM          RADIOGRAPHIC STUDIES: I have personally reviewed the radiological images as listed and agreed with the findings in the report.  CT abdomen and pelvis with contrast 07/20/2014 IMPRESSION: Interval progression of the hepatic and nodal metastatic disease. There is interval increase in the size of the left adrenal mass and mesenteric mass/lymph node.  Percutaneous transhepatic cholecystostomy. No calcified gallstone or biliary ductal dilatation.  No evidence of bowel obstruction or inflammation. Normal appendix.  Abdominal ultrasound 09/19/2014 IMPRESSION: Heterogeneous echotexture throughout the liver. When compared to prior CT, this likely reflects known hepatic metastatic disease. Liver lesions not measurable by today's ultrasound.  No evidence of biliary obstruction.   ASSESSMENT & PLAN:  64 year old female, with history of morbid obese, diabetes, hypertension, congestive heart failure, who was diagnosed with metastases to liver, mesentery and left adrenal gland.  1. 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  -The overall goal of this therapy is disease control and not curative. -Her CT scan in 07/2014 showed disease progression in liver, RP nodes, and left adrenal gland lesion. Her tumor marker chromogranin A has significantly increased lately also, supports disease progression -She is currently on second  line therapy with everolimus, tolerating 10mg  daily well, will continue  -Her hyperbilirubinemia has resolved -will repeat restaging scan in 5-6 weeks   2. Abdominal pain and diarrhea -Likely related to her carcinoid tumor and gallbladder surgery -She'll continue the Imodium as needed. -I refilled her Percocet today  3. Poorly controlled Diabetes and hypertension, history of heart failure -I encourage her to monitor her BP and BG at home, and follow-up with her primary care physician.  4. Anorexia and fatigue  -improved  --Continue nutrition supplement.  5. Hypokalemia -K 3.2 today, likely related to her Lasix -KCL 25meq daily   Plan -continue eveolimus 10mg  daily -I  will see her back in 6 weeks with a restaging CT CAP before her visit  -I refilled her percocet today  -Flu shot today  All questions were answered. The patient knows to call the clinic with any problems, questions or concerns. I spent 20 minutes counseling the patient face to face. The total time spent in the appointment was 25 minutes and more than 50% was on counseling.      Truitt Merle, MD 10/31/2014 9:57 AM

## 2014-10-31 NOTE — Telephone Encounter (Signed)
Gave relative avs report and appointments for December - dates per relative. Central to call re ct - relative aware.

## 2014-12-04 ENCOUNTER — Emergency Department (HOSPITAL_COMMUNITY): Payer: Medicare Other

## 2014-12-04 ENCOUNTER — Inpatient Hospital Stay (HOSPITAL_COMMUNITY)
Admission: EM | Admit: 2014-12-04 | Discharge: 2015-01-08 | DRG: 871 | Disposition: E | Payer: Medicare Other | Attending: Internal Medicine | Admitting: Internal Medicine

## 2014-12-04 DIAGNOSIS — K7200 Acute and subacute hepatic failure without coma: Secondary | ICD-10-CM | POA: Diagnosis present

## 2014-12-04 DIAGNOSIS — R933 Abnormal findings on diagnostic imaging of other parts of digestive tract: Secondary | ICD-10-CM | POA: Diagnosis not present

## 2014-12-04 DIAGNOSIS — R569 Unspecified convulsions: Secondary | ICD-10-CM | POA: Diagnosis not present

## 2014-12-04 DIAGNOSIS — E872 Acidosis, unspecified: Secondary | ICD-10-CM | POA: Insufficient documentation

## 2014-12-04 DIAGNOSIS — N184 Chronic kidney disease, stage 4 (severe): Secondary | ICD-10-CM | POA: Diagnosis present

## 2014-12-04 DIAGNOSIS — I951 Orthostatic hypotension: Secondary | ICD-10-CM

## 2014-12-04 DIAGNOSIS — Z79899 Other long term (current) drug therapy: Secondary | ICD-10-CM

## 2014-12-04 DIAGNOSIS — R112 Nausea with vomiting, unspecified: Secondary | ICD-10-CM | POA: Diagnosis not present

## 2014-12-04 DIAGNOSIS — I469 Cardiac arrest, cause unspecified: Secondary | ICD-10-CM | POA: Diagnosis not present

## 2014-12-04 DIAGNOSIS — C7B00 Secondary carcinoid tumors, unspecified site: Secondary | ICD-10-CM | POA: Diagnosis not present

## 2014-12-04 DIAGNOSIS — K759 Inflammatory liver disease, unspecified: Secondary | ICD-10-CM | POA: Diagnosis not present

## 2014-12-04 DIAGNOSIS — J385 Laryngeal spasm: Secondary | ICD-10-CM | POA: Diagnosis present

## 2014-12-04 DIAGNOSIS — Z515 Encounter for palliative care: Secondary | ICD-10-CM | POA: Diagnosis not present

## 2014-12-04 DIAGNOSIS — C7B02 Secondary carcinoid tumors of liver: Secondary | ICD-10-CM | POA: Diagnosis present

## 2014-12-04 DIAGNOSIS — J9600 Acute respiratory failure, unspecified whether with hypoxia or hypercapnia: Secondary | ICD-10-CM | POA: Diagnosis present

## 2014-12-04 DIAGNOSIS — R6521 Severe sepsis with septic shock: Secondary | ICD-10-CM | POA: Diagnosis present

## 2014-12-04 DIAGNOSIS — E876 Hypokalemia: Secondary | ICD-10-CM | POA: Diagnosis present

## 2014-12-04 DIAGNOSIS — D684 Acquired coagulation factor deficiency: Secondary | ICD-10-CM | POA: Diagnosis present

## 2014-12-04 DIAGNOSIS — I9589 Other hypotension: Secondary | ICD-10-CM | POA: Diagnosis not present

## 2014-12-04 DIAGNOSIS — Z6841 Body Mass Index (BMI) 40.0 and over, adult: Secondary | ICD-10-CM | POA: Diagnosis not present

## 2014-12-04 DIAGNOSIS — N179 Acute kidney failure, unspecified: Secondary | ICD-10-CM | POA: Diagnosis present

## 2014-12-04 DIAGNOSIS — K529 Noninfective gastroenteritis and colitis, unspecified: Secondary | ICD-10-CM | POA: Diagnosis not present

## 2014-12-04 DIAGNOSIS — D899 Disorder involving the immune mechanism, unspecified: Secondary | ICD-10-CM | POA: Diagnosis present

## 2014-12-04 DIAGNOSIS — I248 Other forms of acute ischemic heart disease: Secondary | ICD-10-CM | POA: Diagnosis present

## 2014-12-04 DIAGNOSIS — A09 Infectious gastroenteritis and colitis, unspecified: Secondary | ICD-10-CM | POA: Diagnosis present

## 2014-12-04 DIAGNOSIS — C229 Malignant neoplasm of liver, not specified as primary or secondary: Secondary | ICD-10-CM | POA: Diagnosis present

## 2014-12-04 DIAGNOSIS — N189 Chronic kidney disease, unspecified: Secondary | ICD-10-CM

## 2014-12-04 DIAGNOSIS — E1122 Type 2 diabetes mellitus with diabetic chronic kidney disease: Secondary | ICD-10-CM | POA: Diagnosis present

## 2014-12-04 DIAGNOSIS — E874 Mixed disorder of acid-base balance: Secondary | ICD-10-CM | POA: Diagnosis present

## 2014-12-04 DIAGNOSIS — C227 Other specified carcinomas of liver: Secondary | ICD-10-CM

## 2014-12-04 DIAGNOSIS — C7B09 Secondary carcinoid tumors of other sites: Secondary | ICD-10-CM | POA: Diagnosis present

## 2014-12-04 DIAGNOSIS — E860 Dehydration: Secondary | ICD-10-CM | POA: Diagnosis present

## 2014-12-04 DIAGNOSIS — J811 Chronic pulmonary edema: Secondary | ICD-10-CM

## 2014-12-04 DIAGNOSIS — N170 Acute kidney failure with tubular necrosis: Secondary | ICD-10-CM | POA: Diagnosis present

## 2014-12-04 DIAGNOSIS — D6959 Other secondary thrombocytopenia: Secondary | ICD-10-CM | POA: Diagnosis not present

## 2014-12-04 DIAGNOSIS — E43 Unspecified severe protein-calorie malnutrition: Secondary | ICD-10-CM | POA: Diagnosis present

## 2014-12-04 DIAGNOSIS — K567 Ileus, unspecified: Secondary | ICD-10-CM

## 2014-12-04 DIAGNOSIS — G9340 Encephalopathy, unspecified: Secondary | ICD-10-CM | POA: Diagnosis present

## 2014-12-04 DIAGNOSIS — J189 Pneumonia, unspecified organism: Secondary | ICD-10-CM | POA: Diagnosis not present

## 2014-12-04 DIAGNOSIS — Z66 Do not resuscitate: Secondary | ICD-10-CM | POA: Diagnosis not present

## 2014-12-04 DIAGNOSIS — Z87898 Personal history of other specified conditions: Secondary | ICD-10-CM | POA: Diagnosis not present

## 2014-12-04 DIAGNOSIS — I959 Hypotension, unspecified: Secondary | ICD-10-CM | POA: Diagnosis present

## 2014-12-04 DIAGNOSIS — R579 Shock, unspecified: Secondary | ICD-10-CM | POA: Diagnosis not present

## 2014-12-04 DIAGNOSIS — R34 Anuria and oliguria: Secondary | ICD-10-CM | POA: Diagnosis present

## 2014-12-04 DIAGNOSIS — I129 Hypertensive chronic kidney disease with stage 1 through stage 4 chronic kidney disease, or unspecified chronic kidney disease: Secondary | ICD-10-CM | POA: Diagnosis present

## 2014-12-04 DIAGNOSIS — R1319 Other dysphagia: Secondary | ICD-10-CM | POA: Diagnosis present

## 2014-12-04 DIAGNOSIS — L8990 Pressure ulcer of unspecified site, unspecified stage: Secondary | ICD-10-CM | POA: Insufficient documentation

## 2014-12-04 DIAGNOSIS — A419 Sepsis, unspecified organism: Secondary | ICD-10-CM | POA: Diagnosis present

## 2014-12-04 DIAGNOSIS — G931 Anoxic brain damage, not elsewhere classified: Secondary | ICD-10-CM | POA: Diagnosis present

## 2014-12-04 DIAGNOSIS — Z794 Long term (current) use of insulin: Secondary | ICD-10-CM

## 2014-12-04 DIAGNOSIS — R197 Diarrhea, unspecified: Secondary | ICD-10-CM | POA: Diagnosis not present

## 2014-12-04 DIAGNOSIS — R0600 Dyspnea, unspecified: Secondary | ICD-10-CM | POA: Diagnosis not present

## 2014-12-04 DIAGNOSIS — K559 Vascular disorder of intestine, unspecified: Secondary | ICD-10-CM | POA: Diagnosis present

## 2014-12-04 DIAGNOSIS — R680 Hypothermia, not associated with low environmental temperature: Secondary | ICD-10-CM | POA: Diagnosis present

## 2014-12-04 DIAGNOSIS — C7A00 Malignant carcinoid tumor of unspecified site: Secondary | ICD-10-CM | POA: Diagnosis present

## 2014-12-04 DIAGNOSIS — Z4659 Encounter for fitting and adjustment of other gastrointestinal appliance and device: Secondary | ICD-10-CM

## 2014-12-04 DIAGNOSIS — E861 Hypovolemia: Secondary | ICD-10-CM | POA: Diagnosis present

## 2014-12-04 DIAGNOSIS — C224 Other sarcomas of liver: Secondary | ICD-10-CM | POA: Diagnosis not present

## 2014-12-04 HISTORY — DX: Diarrhea, unspecified: R19.7

## 2014-12-04 HISTORY — DX: Systemic inflammatory response syndrome (sirs) of non-infectious origin without acute organ dysfunction: R65.10

## 2014-12-04 HISTORY — DX: Benign carcinoid tumor of unspecified site: D3A.00

## 2014-12-04 HISTORY — DX: Morbid (severe) obesity due to excess calories: E66.01

## 2014-12-04 HISTORY — DX: Type 2 diabetes mellitus without complications: E11.9

## 2014-12-04 HISTORY — DX: Iron deficiency anemia, unspecified: D50.9

## 2014-12-04 LAB — BRAIN NATRIURETIC PEPTIDE: B Natriuretic Peptide: 120.1 pg/mL — ABNORMAL HIGH (ref 0.0–100.0)

## 2014-12-04 LAB — I-STAT CG4 LACTIC ACID, ED
Lactic Acid, Venous: 10.76 mmol/L (ref 0.5–2.0)
Lactic Acid, Venous: 11.73 mmol/L (ref 0.5–2.0)

## 2014-12-04 LAB — I-STAT ARTERIAL BLOOD GAS, ED
ACID-BASE DEFICIT: 12 mmol/L — AB (ref 0.0–2.0)
Bicarbonate: 13.1 mEq/L — ABNORMAL LOW (ref 20.0–24.0)
O2 SAT: 99 %
PH ART: 7.28 — AB (ref 7.350–7.450)
Patient temperature: 98
TCO2: 14 mmol/L (ref 0–100)
pCO2 arterial: 27.8 mmHg — ABNORMAL LOW (ref 35.0–45.0)
pO2, Arterial: 139 mmHg — ABNORMAL HIGH (ref 80.0–100.0)

## 2014-12-04 LAB — COMPREHENSIVE METABOLIC PANEL
ALK PHOS: 384 U/L — AB (ref 38–126)
ALT: 156 U/L — AB (ref 14–54)
ALT: 366 U/L — AB (ref 14–54)
ANION GAP: 22 — AB (ref 5–15)
AST: 666 U/L — ABNORMAL HIGH (ref 15–41)
AST: 1173 U/L — ABNORMAL HIGH (ref 15–41)
Albumin: 2.1 g/dL — ABNORMAL LOW (ref 3.5–5.0)
Albumin: 1.9 g/dL — ABNORMAL LOW (ref 3.5–5.0)
Alkaline Phosphatase: 360 U/L — ABNORMAL HIGH (ref 38–126)
Anion gap: 21 — ABNORMAL HIGH (ref 5–15)
BUN: 21 mg/dL — ABNORMAL HIGH (ref 6–20)
BUN: 21 mg/dL — ABNORMAL HIGH (ref 6–20)
CALCIUM: 6.5 mg/dL — AB (ref 8.9–10.3)
CHLORIDE: 105 mmol/L (ref 101–111)
CO2: 17 mmol/L — AB (ref 22–32)
CO2: 13 mmol/L — AB (ref 22–32)
CREATININE: 3.39 mg/dL — AB (ref 0.44–1.00)
Calcium: 7.2 mg/dL — ABNORMAL LOW (ref 8.9–10.3)
Chloride: 108 mmol/L (ref 101–111)
Creatinine, Ser: 3.87 mg/dL — ABNORMAL HIGH (ref 0.44–1.00)
GFR calc non Af Amer: 11 mL/min — ABNORMAL LOW (ref >60)
GFR, EST AFRICAN AMERICAN: 13 mL/min — AB (ref >60)
GFR, EST AFRICAN AMERICAN: 15 mL/min — AB (ref >60)
GFR, EST NON AFRICAN AMERICAN: 13 mL/min — AB (ref >60)
Glucose, Bld: 169 mg/dL — ABNORMAL HIGH (ref 65–99)
Glucose, Bld: 178 mg/dL — ABNORMAL HIGH (ref 65–99)
Potassium: 2 mmol/L — CL (ref 3.5–5.1)
Potassium: 2.1 mmol/L — CL (ref 3.5–5.1)
SODIUM: 144 mmol/L (ref 135–145)
SODIUM: 142 mmol/L (ref 135–145)
Total Bilirubin: 2.1 mg/dL — ABNORMAL HIGH (ref 0.3–1.2)
Total Bilirubin: 2.4 mg/dL — ABNORMAL HIGH (ref 0.3–1.2)
Total Protein: 5.5 g/dL — ABNORMAL LOW (ref 6.5–8.1)
Total Protein: 5.7 g/dL — ABNORMAL LOW (ref 6.5–8.1)

## 2014-12-04 LAB — CBC WITH DIFFERENTIAL/PLATELET
BASOS PCT: 0 %
Basophils Absolute: 0 10*3/uL (ref 0.0–0.1)
Basophils Absolute: 0 10*3/uL (ref 0.0–0.1)
Basophils Relative: 0 %
EOS ABS: 0 10*3/uL (ref 0.0–0.7)
EOS ABS: 0 10*3/uL (ref 0.0–0.7)
EOS PCT: 0 %
EOS PCT: 0 %
HCT: 38.5 % (ref 36.0–46.0)
HCT: 43 % (ref 36.0–46.0)
HEMOGLOBIN: 13.1 g/dL (ref 12.0–15.0)
Hemoglobin: 11.8 g/dL — ABNORMAL LOW (ref 12.0–15.0)
LYMPHS ABS: 2.5 10*3/uL (ref 0.7–4.0)
Lymphocytes Relative: 18 %
Lymphocytes Relative: 10 %
Lymphs Abs: 2.1 10*3/uL (ref 0.7–4.0)
MCH: 26 pg (ref 26.0–34.0)
MCH: 25.9 pg — ABNORMAL LOW (ref 26.0–34.0)
MCHC: 30.6 g/dL (ref 30.0–36.0)
MCHC: 30.5 g/dL (ref 30.0–36.0)
MCV: 84.8 fL (ref 78.0–100.0)
MCV: 85 fL (ref 78.0–100.0)
MONOS PCT: 5 %
Monocytes Absolute: 1 10*3/uL (ref 0.1–1.0)
Monocytes Absolute: 1 10*3/uL (ref 0.1–1.0)
Monocytes Relative: 8 %
NEUTROS PCT: 86 %
Neutro Abs: 10.3 10*3/uL — ABNORMAL HIGH (ref 1.7–7.7)
Neutro Abs: 19.1 10*3/uL — ABNORMAL HIGH (ref 1.7–7.7)
Neutrophils Relative %: 74 %
PLATELETS: 191 10*3/uL (ref 150–400)
PLATELETS: 197 10*3/uL (ref 150–400)
RBC: 4.54 MIL/uL (ref 3.87–5.11)
RBC: 5.06 MIL/uL (ref 3.87–5.11)
RDW: 15.3 % (ref 11.5–15.5)
RDW: 15.6 % — AB (ref 11.5–15.5)
WBC: 13.9 10*3/uL — AB (ref 4.0–10.5)
WBC: 22.3 10*3/uL — ABNORMAL HIGH (ref 4.0–10.5)

## 2014-12-04 LAB — POCT I-STAT 3, ART BLOOD GAS (G3+)
Acid-base deficit: 15 mmol/L — ABNORMAL HIGH (ref 0.0–2.0)
Bicarbonate: 9.5 mEq/L — ABNORMAL LOW (ref 20.0–24.0)
O2 SAT: 99 %
TCO2: 10 mmol/L (ref 0–100)
pCO2 arterial: 18.2 mmHg — CL (ref 35.0–45.0)
pH, Arterial: 7.316 — ABNORMAL LOW (ref 7.350–7.450)
pO2, Arterial: 144 mmHg — ABNORMAL HIGH (ref 80.0–100.0)

## 2014-12-04 LAB — TSH: TSH: 5.004 u[IU]/mL — AB (ref 0.350–4.500)

## 2014-12-04 LAB — BASIC METABOLIC PANEL
Anion gap: 25 — ABNORMAL HIGH (ref 5–15)
BUN: 22 mg/dL — AB (ref 6–20)
CHLORIDE: 110 mmol/L (ref 101–111)
CO2: 6 mmol/L — AB (ref 22–32)
Calcium: 6.6 mg/dL — ABNORMAL LOW (ref 8.9–10.3)
Creatinine, Ser: 3.7 mg/dL — ABNORMAL HIGH (ref 0.44–1.00)
GFR calc Af Amer: 14 mL/min — ABNORMAL LOW (ref >60)
GFR calc non Af Amer: 12 mL/min — ABNORMAL LOW (ref >60)
GLUCOSE: 185 mg/dL — AB (ref 65–99)
POTASSIUM: 2.4 mmol/L — AB (ref 3.5–5.1)
Sodium: 141 mmol/L (ref 135–145)

## 2014-12-04 LAB — MRSA PCR SCREENING: MRSA by PCR: NEGATIVE

## 2014-12-04 LAB — PROTIME-INR
INR: 1.97 — ABNORMAL HIGH (ref 0.00–1.49)
PROTHROMBIN TIME: 22.3 s — AB (ref 11.6–15.2)

## 2014-12-04 LAB — GLUCOSE, CAPILLARY
GLUCOSE-CAPILLARY: 179 mg/dL — AB (ref 65–99)
GLUCOSE-CAPILLARY: 154 mg/dL — AB (ref 65–99)
Glucose-Capillary: 135 mg/dL — ABNORMAL HIGH (ref 65–99)

## 2014-12-04 LAB — I-STAT TROPONIN, ED: Troponin i, poc: 0.54 ng/mL (ref 0.00–0.08)

## 2014-12-04 LAB — LIPASE, BLOOD: LIPASE: 30 U/L (ref 11–51)

## 2014-12-04 LAB — CORTISOL: Cortisol, Plasma: 42.4 ug/dL

## 2014-12-04 LAB — APTT: APTT: 52 s — AB (ref 24–37)

## 2014-12-04 LAB — I-STAT VENOUS BLOOD GAS, ED
ACID-BASE DEFICIT: 14 mmol/L — AB (ref 0.0–2.0)
Bicarbonate: 13 mEq/L — ABNORMAL LOW (ref 20.0–24.0)
O2 Saturation: 93 %
PH VEN: 7.192 — AB (ref 7.250–7.300)
TCO2: 14 mmol/L (ref 0–100)
pCO2, Ven: 34 mmHg — ABNORMAL LOW (ref 45.0–50.0)
pO2, Ven: 83 mmHg — ABNORMAL HIGH (ref 30.0–45.0)

## 2014-12-04 LAB — AMMONIA: AMMONIA: 74 umol/L — AB (ref 9–35)

## 2014-12-04 LAB — PROCALCITONIN: Procalcitonin: 3.91 ng/mL

## 2014-12-04 LAB — PHOSPHORUS: Phosphorus: 6.4 mg/dL — ABNORMAL HIGH (ref 2.5–4.6)

## 2014-12-04 LAB — LACTIC ACID, PLASMA
LACTIC ACID, VENOUS: 9.8 mmol/L — AB (ref 0.5–2.0)
Lactic Acid, Venous: 10.9 mmol/L (ref 0.5–2.0)

## 2014-12-04 LAB — CBG MONITORING, ED: GLUCOSE-CAPILLARY: 108 mg/dL — AB (ref 65–99)

## 2014-12-04 LAB — MAGNESIUM: Magnesium: 1.7 mg/dL (ref 1.7–2.4)

## 2014-12-04 LAB — AMYLASE: AMYLASE: 419 U/L — AB (ref 28–100)

## 2014-12-04 LAB — TROPONIN I
TROPONIN I: 1.49 ng/mL — AB (ref <0.031)
Troponin I: 1.07 ng/mL (ref <0.031)

## 2014-12-04 LAB — D-DIMER, QUANTITATIVE: D-Dimer, Quant: >20 ug/mL-FEU — ABNORMAL HIGH (ref 0.00–0.50)

## 2014-12-04 MED ORDER — FENTANYL CITRATE (PF) 100 MCG/2ML IJ SOLN
100.0000 ug | INTRAMUSCULAR | Status: DC | PRN
  Administered 2014-12-05: 50 ug via INTRAVENOUS
  Administered 2014-12-05 (×4): 100 ug via INTRAVENOUS
  Administered 2014-12-06: 50 ug via INTRAVENOUS
  Administered 2014-12-08 – 2014-12-11 (×3): 100 ug via INTRAVENOUS
  Filled 2014-12-04 (×10): qty 2

## 2014-12-04 MED ORDER — SODIUM CHLORIDE 0.9 % IV BOLUS (SEPSIS)
1000.0000 mL | Freq: Once | INTRAVENOUS | Status: AC
  Administered 2014-12-04: 1000 mL via INTRAVENOUS

## 2014-12-04 MED ORDER — VANCOMYCIN HCL 10 G IV SOLR
1750.0000 mg | INTRAVENOUS | Status: DC
  Administered 2014-12-06: 1750 mg via INTRAVENOUS
  Filled 2014-12-04: qty 1750

## 2014-12-04 MED ORDER — LACTULOSE 10 GM/15ML PO SOLN
30.0000 g | Freq: Three times a day (TID) | ORAL | Status: DC
  Administered 2014-12-04 – 2014-12-06 (×7): 30 g
  Filled 2014-12-04 (×7): qty 45

## 2014-12-04 MED ORDER — FENTANYL CITRATE (PF) 100 MCG/2ML IJ SOLN
INTRAMUSCULAR | Status: AC
  Administered 2014-12-04: 100 ug via INTRAVENOUS
  Filled 2014-12-04: qty 2

## 2014-12-04 MED ORDER — FENTANYL CITRATE (PF) 100 MCG/2ML IJ SOLN
100.0000 ug | Freq: Once | INTRAMUSCULAR | Status: AC
  Administered 2014-12-04: 100 ug via INTRAVENOUS

## 2014-12-04 MED ORDER — ETOMIDATE 2 MG/ML IV SOLN
INTRAVENOUS | Status: AC | PRN
  Administered 2014-12-04: 20 mg via INTRAVENOUS

## 2014-12-04 MED ORDER — DEXTROSE 5 % IV SOLN
2.0000 g | INTRAVENOUS | Status: DC
  Administered 2014-12-04 – 2014-12-06 (×3): 2 g via INTRAVENOUS
  Filled 2014-12-04 (×4): qty 2

## 2014-12-04 MED ORDER — SODIUM CHLORIDE 0.9 % IV SOLN
1.0000 g | Freq: Once | INTRAVENOUS | Status: AC
  Administered 2014-12-04: 1 g via INTRAVENOUS
  Filled 2014-12-04: qty 10

## 2014-12-04 MED ORDER — ANTISEPTIC ORAL RINSE SOLUTION (CORINZ)
7.0000 mL | OROMUCOSAL | Status: DC
  Administered 2014-12-04 – 2014-12-08 (×34): 7 mL via OROMUCOSAL

## 2014-12-04 MED ORDER — FENTANYL CITRATE (PF) 100 MCG/2ML IJ SOLN
100.0000 ug | INTRAMUSCULAR | Status: AC | PRN
  Administered 2014-12-04 (×3): 100 ug via INTRAVENOUS
  Filled 2014-12-04 (×2): qty 2

## 2014-12-04 MED ORDER — NOREPINEPHRINE BITARTRATE 1 MG/ML IV SOLN
0.0000 ug/min | INTRAVENOUS | Status: DC
  Administered 2014-12-04: 10 ug/min via INTRAVENOUS
  Filled 2014-12-04: qty 4

## 2014-12-04 MED ORDER — DEXTROSE 5 % IV SOLN
0.0000 ug/min | INTRAVENOUS | Status: DC
  Administered 2014-12-04: 40 ug/min via INTRAVENOUS
  Administered 2014-12-05: 10 ug/min via INTRAVENOUS
  Administered 2014-12-05: 30 ug/min via INTRAVENOUS
  Administered 2014-12-06: 8.533 ug/min via INTRAVENOUS
  Administered 2014-12-07: 22 ug/min via INTRAVENOUS
  Administered 2014-12-07: 25 ug/min via INTRAVENOUS
  Administered 2014-12-08: 28 ug/min via INTRAVENOUS
  Administered 2014-12-08: 30 ug/min via INTRAVENOUS
  Administered 2014-12-09: 17 ug/min via INTRAVENOUS
  Administered 2014-12-09: 30 ug/min via INTRAVENOUS
  Administered 2014-12-10: 2 ug/min via INTRAVENOUS
  Administered 2014-12-12: 10 ug/min via INTRAVENOUS
  Administered 2014-12-12: 15 ug/min via INTRAVENOUS
  Administered 2014-12-12: 20 ug/min via INTRAVENOUS
  Administered 2014-12-13: 12 ug/min via INTRAVENOUS
  Administered 2014-12-13: 8 ug/min via INTRAVENOUS
  Filled 2014-12-04 (×15): qty 16

## 2014-12-04 MED ORDER — SODIUM CHLORIDE 0.9 % IV SOLN
0.0300 [IU]/min | INTRAVENOUS | Status: DC
  Administered 2014-12-04 – 2014-12-07 (×4): 0.03 [IU]/min via INTRAVENOUS
  Filled 2014-12-04 (×4): qty 2

## 2014-12-04 MED ORDER — ASPIRIN 300 MG RE SUPP: 300.0000 mg | RECTAL | Status: AC

## 2014-12-04 MED ORDER — FENTANYL CITRATE (PF) 100 MCG/2ML IJ SOLN
INTRAMUSCULAR | Status: AC
  Filled 2014-12-04: qty 2

## 2014-12-04 MED ORDER — PIPERACILLIN-TAZOBACTAM 3.375 G IVPB 30 MIN
3.3750 g | Freq: Once | INTRAVENOUS | Status: DC
  Filled 2014-12-04: qty 50

## 2014-12-04 MED ORDER — VANCOMYCIN HCL 10 G IV SOLR
2500.0000 mg | Freq: Once | INTRAVENOUS | Status: DC
  Filled 2014-12-04: qty 2500

## 2014-12-04 MED ORDER — POTASSIUM CHLORIDE 10 MEQ/50ML IV SOLN
10.0000 meq | INTRAVENOUS | Status: AC
  Administered 2014-12-04 (×4): 10 meq via INTRAVENOUS
  Filled 2014-12-04 (×4): qty 50

## 2014-12-04 MED ORDER — SODIUM CHLORIDE 0.9 % IV SOLN: 250.0000 mL | INTRAVENOUS | Status: DC | PRN

## 2014-12-04 MED ORDER — SODIUM BICARBONATE 8.4 % IV SOLN
INTRAVENOUS | Status: DC
  Administered 2014-12-05 (×2): via INTRAVENOUS
  Filled 2014-12-04 (×4): qty 150

## 2014-12-04 MED ORDER — VANCOMYCIN HCL 10 G IV SOLR
2500.0000 mg | Freq: Once | INTRAVENOUS | Status: AC
  Administered 2014-12-04: 2500 mg via INTRAVENOUS
  Filled 2014-12-04: qty 2500

## 2014-12-04 MED ORDER — HYDROCORTISONE NA SUCCINATE PF 100 MG IJ SOLR
50.0000 mg | Freq: Four times a day (QID) | INTRAMUSCULAR | Status: DC
  Administered 2014-12-04 – 2014-12-05 (×4): 50 mg via INTRAVENOUS
  Filled 2014-12-04 (×4): qty 2

## 2014-12-04 MED ORDER — PROPOFOL 1000 MG/100ML IV EMUL: 5.0000 ug/kg/min | Freq: Once | INTRAVENOUS | Status: DC

## 2014-12-04 MED ORDER — PANTOPRAZOLE SODIUM 40 MG IV SOLR
40.0000 mg | Freq: Every day | INTRAVENOUS | Status: DC
  Administered 2014-12-04 – 2014-12-12 (×9): 40 mg via INTRAVENOUS
  Filled 2014-12-04 (×9): qty 40

## 2014-12-04 MED ORDER — ONDANSETRON HCL 4 MG/2ML IJ SOLN
4.0000 mg | Freq: Four times a day (QID) | INTRAMUSCULAR | Status: DC | PRN
  Administered 2014-12-11: 4 mg via INTRAVENOUS
  Filled 2014-12-04: qty 2

## 2014-12-04 MED ORDER — POTASSIUM CHLORIDE 20 MEQ/15ML (10%) PO SOLN
40.0000 meq | Freq: Once | ORAL | Status: AC
  Administered 2014-12-04: 40 meq
  Filled 2014-12-04: qty 30

## 2014-12-04 MED ORDER — PROPOFOL 1000 MG/100ML IV EMUL
INTRAVENOUS | Status: AC
  Filled 2014-12-04: qty 100

## 2014-12-04 MED ORDER — POTASSIUM CHLORIDE 10 MEQ/50ML IV SOLN
10.0000 meq | INTRAVENOUS | Status: AC
  Administered 2014-12-04 – 2014-12-05 (×4): 10 meq via INTRAVENOUS
  Filled 2014-12-04 (×4): qty 50

## 2014-12-04 MED ORDER — SODIUM CHLORIDE 0.9 % IV SOLN
INTRAVENOUS | Status: DC
  Administered 2014-12-04: 18:00:00 via INTRAVENOUS

## 2014-12-04 MED ORDER — ASPIRIN 81 MG PO CHEW
324.0000 mg | CHEWABLE_TABLET | ORAL | Status: AC
  Administered 2014-12-04: 324 mg via ORAL
  Filled 2014-12-04: qty 4

## 2014-12-04 MED ORDER — CHLORHEXIDINE GLUCONATE 0.12% ORAL RINSE (MEDLINE KIT)
15.0000 mL | Freq: Two times a day (BID) | OROMUCOSAL | Status: DC
  Administered 2014-12-04 – 2014-12-08 (×7): 15 mL via OROMUCOSAL

## 2014-12-04 MED ORDER — INSULIN ASPART 100 UNIT/ML ~~LOC~~ SOLN
0.0000 [IU] | SUBCUTANEOUS | Status: DC
  Administered 2014-12-04: 1 [IU] via SUBCUTANEOUS
  Administered 2014-12-04 (×2): 2 [IU] via SUBCUTANEOUS
  Administered 2014-12-05: 3 [IU] via SUBCUTANEOUS
  Administered 2014-12-05 – 2014-12-06 (×5): 2 [IU] via SUBCUTANEOUS
  Administered 2014-12-06: 1 [IU] via SUBCUTANEOUS
  Administered 2014-12-06 (×2): 2 [IU] via SUBCUTANEOUS
  Administered 2014-12-06: 1 [IU] via SUBCUTANEOUS
  Administered 2014-12-06: 2 [IU] via SUBCUTANEOUS
  Administered 2014-12-07 (×3): 1 [IU] via SUBCUTANEOUS
  Administered 2014-12-07 (×2): 2 [IU] via SUBCUTANEOUS
  Administered 2014-12-07 (×2): 1 [IU] via SUBCUTANEOUS
  Administered 2014-12-08: 2 [IU] via SUBCUTANEOUS
  Administered 2014-12-08: 3 [IU] via SUBCUTANEOUS
  Administered 2014-12-08: 2 [IU] via SUBCUTANEOUS
  Administered 2014-12-08: 1 [IU] via SUBCUTANEOUS
  Administered 2014-12-08 (×2): 2 [IU] via SUBCUTANEOUS
  Administered 2014-12-09 (×4): 1 [IU] via SUBCUTANEOUS
  Administered 2014-12-09: 2 [IU] via SUBCUTANEOUS
  Administered 2014-12-10 (×2): 1 [IU] via SUBCUTANEOUS
  Administered 2014-12-10: 2 [IU] via SUBCUTANEOUS
  Administered 2014-12-10 (×2): 1 [IU] via SUBCUTANEOUS
  Administered 2014-12-11: 3 [IU] via SUBCUTANEOUS
  Administered 2014-12-11 (×2): 2 [IU] via SUBCUTANEOUS
  Administered 2014-12-11: 3 [IU] via SUBCUTANEOUS
  Administered 2014-12-11 – 2014-12-12 (×4): 2 [IU] via SUBCUTANEOUS
  Administered 2014-12-12: 3 [IU] via SUBCUTANEOUS
  Administered 2014-12-12: 2 [IU] via SUBCUTANEOUS
  Administered 2014-12-12: 3 [IU] via SUBCUTANEOUS
  Administered 2014-12-12: 2 [IU] via SUBCUTANEOUS
  Administered 2014-12-13 (×4): 3 [IU] via SUBCUTANEOUS

## 2014-12-04 NOTE — ED Notes (Signed)
Critical care paged 

## 2014-12-04 NOTE — Progress Notes (Signed)
[facility] Progress Note Patient Name: Yesenia Lyons DOB: September 26, 1950 MRN: [id number]   Date of Service  12/02/2014  HPI/Events of Note  Multiple issues: 1. Hypothermia - Temp = 95.2, 2. No urine output and 2. Troponin = 1.07.  eICU Interventions  Will order: 1. [facility]. 2. Bladder Scan. If patient has residual >> replace Foley catheter.  3. Continue to trend Troponin.     Intervention Category Intermediate Interventions: Other:  Lysle Dingwall 11/21/2014, 6:40 PM

## 2014-12-04 NOTE — Procedures (Signed)
Central Venous Catheter Insertion Procedure Note Cherilee Menard [id number] [birth year]  Procedure: Insertion of Central Venous Catheter Indications: Assessment of intravascular volume, Drug and/or fluid administration and Frequent blood sampling  Procedure Details Consent: Unable to obtain consent because of emergent medical necessity. Time Out: Verified patient identification, verified procedure, site/side was marked, verified correct patient position, special equipment/implants available, medications/allergies/relevent history reviewed, required imaging and test results available.  Performed  Maximum sterile technique was used including antiseptics, cap, gloves, gown, hand hygiene, mask and sheet. Skin prep: Chlorhexidine; local anesthetic administered A antimicrobial bonded/coated triple lumen catheter was placed in the left internal jugular vein using the Seldinger technique.  Evaluation Blood flow good Complications: No apparent complications Patient did tolerate procedure well. Chest X-ray ordered to verify placement.  CXR: pending.  Raylene Miyamoto. 12/03/2014, 1:32 PM  [place]  I DID THE PROCEDURE MySELF!!!!

## 2014-12-04 NOTE — Progress Notes (Addendum)
ANTIBIOTIC CONSULT NOTE - INITIAL  Pharmacy Consult for vancomycin and Zosyn Indication: rule out sepsis  Allergies not on file  Patient Measurements:   Height: 5'10" Weight: 133kg ^measurements as of 10/24 from office visit with Dr. Burr Medico (obtained from chart review- merged chart)  Vital Signs: BP: 148/124 mmHg (11/27 1137) Pulse Rate: 61 (11/27 1149)   Labs: No results for input(s): WBC, HGB, PLT, LABCREA, CREATININE in the last 72 hours. CrCl cannot be calculated (Unknown ideal weight.). No results for input(s): VANCOTROUGH, VANCOPEAK, VANCORANDOM, GENTTROUGH, GENTPEAK, GENTRANDOM, TOBRATROUGH, TOBRAPEAK, TOBRARND, AMIKACINPEAK, AMIKACINTROU, AMIKACIN in the last 72 hours.   Assessment: 20 YOF found unresponsive by family sitting on toilet- was last seen normal 30 minutes prior to this. In PEA upon EMS arrival- given 2 epi's and spontaneous pulses achieved. Patient is now intubated.  To start vancomycin and Zosyn for r/o sepsis. Height and weight listed above found in chart marked for merge- from outpatient appointment from 10/24.  Lactic acid elevated at 10.76. CBC and CMET in process.  Blood cultures have been ordered.  Goal of Therapy:  Vancomycin trough level 15-20 mcg/ml  Plan:  -Zosyn 3.375g IV x1 over 30 minutes- GIVE FIRST -vancomycin 2500mg  IV x1- GIVE SECOND -will await lab work for follow up doses  Marvine Encalade D. Shaquavia Whisonant, PharmD, BCPS Clinical Pharmacist Pager: [phone] 11/30/2014 12:27 PM  ADDENDUM To change antibiotics to vancomycin and ceftazidime for HCAP.  Zosyn and vancomycin as ordered above were hand delivered to RN. Neither charted as hung yet.  SCr 3.87, normalized CrCl ~75mL/min.  Plan: -vancomycin 2500mg  IV x1 as previously ordered- re-entered to be given now -maintenance dose 1750mg  IV q48h as per obesity nomogram -ceftazidime 2g IV q24h -f/u c/s, clinical progression, renal function/need for RRT  Admire Bunnell D. Jazmynn Pho, PharmD, BCPS Clinical  Pharmacist Pager: [phone] 11/28/2014 1:37 PM

## 2014-12-04 NOTE — ED Notes (Signed)
Pt responding to question stating she is in pain.

## 2014-12-04 NOTE — ED Provider Notes (Signed)
CSN: [id number]     Arrival date & time 12/06/2014  1125 History   First MD Initiated Contact with Patient 11/24/2014 1202     Chief Complaint  Patient presents with  . unresponsive      (Consider location/radiation/quality/duration/timing/severity/associated sxs/prior Treatment) HPI Comments: Initial hx taken from EMS Pt hx liver cancer Went to bathroom, daughter saw her 72min before and went to check on her in bathroom and found her unconscious Patient without a pulse and CPR initiated PEA arrest 2 rounds of epinephrine then [place] airway placed and pt with gagging and the balloon ruptured and king airway was pulled. Pt with spontaneous respirations on nonrebreather Pt given 5mg  versed for gagging with king airway  Family hx discussed after initial care Pt with hx of cancer, decreased appetite due to chronic abd pain, was only eating ensure and since Friday has only had water per daughter. Has appeared more generally weak, sleepy especially worsening yesterday and today. No CP/SOB/cough/urinary symptoms prior to episode   No past medical history on file. No past surgical history on file. No family history on file. Social History  Substance Use Topics  . Smoking status: Not on file  . Smokeless tobacco: Not on file  . Alcohol Use: Not on file   OB History    No data available     Review of Systems  Unable to perform ROS: Patient unresponsive  Constitutional: Positive for appetite change and fatigue. Negative for fever.  Respiratory: Negative for cough and shortness of breath (according to daughter prior to episode).   Cardiovascular: Negative for chest pain.  Gastrointestinal: Positive for nausea and diarrhea (chronic no change). Negative for blood in stool.  Genitourinary: Negative for dysuria.  Neurological: Positive for light-headedness.      Allergies  Penicillins  Home Medications   Prior to Admission medications   Not on File   BP 88/51 mmHg  Pulse 81   Temp(Src) 95.4 F (35.2 C) (Rectal)  Resp 19  Ht 6\' 4"  (1.93 m)  Wt 293 lb 3.4 oz (133 kg)  BMI 35.71 kg/m2  SpO2 100% Physical Exam  Constitutional: She appears toxic. She has a sickly appearance. She appears ill. Face mask in place.  Eyes: Conjunctivae are normal. Pupils are equal, round, and reactive to light.  Cardiovascular: Normal rate and regular rhythm.   Pulses:      Radial pulses are 1+ on the right side, and 1+ on the left side.  Pulmonary/Chest: No respiratory distress. She has no wheezes.  Bradypnea   Abdominal: She exhibits no distension.  Musculoskeletal: She exhibits no edema.  Neurological: She is unresponsive. GCS eye subscore is 1. GCS verbal subscore is 1. GCS motor subscore is 1.  Skin: No rash noted. She is not diaphoretic.    ED Course  .Intubation Date/Time: 11/10/2014 9:31 PM Performed by: Gareth Morgan Authorized by: Gareth Morgan Consent: The procedure was performed in an emergent situation. Required items: required blood products, implants, devices, and special equipment available Time out: Immediately prior to procedure a "time out" was called to verify the correct patient, procedure, equipment, support staff and site/side marked as required. Indications: airway protection Intubation method: video-assisted (glidescope) Patient status: unconscious Preoxygenation: nonrebreather mask Laryngoscope size: Mac 3 Tube size: 7.5 mm Tube type: cuffed Number of attempts: 1 Cords visualized: yes Post-procedure assessment: chest rise,  ETCO2 monitor and CO2 detector Breath sounds: equal Cuff inflated: yes ETT to lip: 23 cm Tube secured with: ETT holder Chest x-ray interpreted  by me. Chest x-ray findings: endotracheal tube in appropriate position Patient tolerance: Patient tolerated the procedure well with no immediate complications   (including critical care time) Labs Review Labs Reviewed  CBC WITH DIFFERENTIAL/PLATELET - Abnormal; Notable for  the following:    WBC 13.9 (*)    Hemoglobin 11.8 (*)    Neutro Abs 10.3 (*)    All other components within normal limits  COMPREHENSIVE METABOLIC PANEL - Abnormal; Notable for the following:    Potassium 2.0 (*)    CO2 17 (*)    Glucose, Bld 169 (*)    BUN 21 (*)    Creatinine, Ser 3.87 (*)    Calcium 7.2 (*)    Total Protein 5.5 (*)    Albumin 2.1 (*)    AST 666 (*)    ALT 156 (*)    Alkaline Phosphatase 360 (*)    Total Bilirubin 2.1 (*)    GFR calc non Af Amer 11 (*)    GFR calc Af Amer 13 (*)    Anion gap 22 (*)    All other components within normal limits  AMMONIA - Abnormal; Notable for the following:    Ammonia 74 (*)    All other components within normal limits  PHOSPHORUS - Abnormal; Notable for the following:    Phosphorus 6.4 (*)    All other components within normal limits  AMYLASE - Abnormal; Notable for the following:    Amylase 419 (*)    All other components within normal limits  TROPONIN I - Abnormal; Notable for the following:    Troponin I 1.07 (*)    All other components within normal limits  TROPONIN I - Abnormal; Notable for the following:    Troponin I 1.49 (*)    All other components within normal limits  LACTIC ACID, PLASMA - Abnormal; Notable for the following:    Lactic Acid, Venous 9.8 (*)    All other components within normal limits  BRAIN NATRIURETIC PEPTIDE - Abnormal; Notable for the following:    B Natriuretic Peptide 120.1 (*)    All other components within normal limits  COMPREHENSIVE METABOLIC PANEL - Abnormal; Notable for the following:    Potassium 2.1 (*)    CO2 13 (*)    Glucose, Bld 178 (*)    BUN 21 (*)    Creatinine, Ser 3.39 (*)    Calcium 6.5 (*)    Total Protein 5.7 (*)    Albumin 1.9 (*)    AST 1173 (*)    ALT 366 (*)    Alkaline Phosphatase 384 (*)    Total Bilirubin 2.4 (*)    GFR calc non Af Amer 13 (*)    GFR calc Af Amer 15 (*)    Anion gap 21 (*)    All other components within normal limits  CBC WITH  DIFFERENTIAL/PLATELET - Abnormal; Notable for the following:    WBC 22.3 (*)    MCH 25.9 (*)    RDW 15.6 (*)    Neutro Abs 19.1 (*)    All other components within normal limits  APTT - Abnormal; Notable for the following:    aPTT 52 (*)    All other components within normal limits  PROTIME-INR - Abnormal; Notable for the following:    Prothrombin Time 22.3 (*)    INR 1.97 (*)    All other components within normal limits  D-DIMER, QUANTITATIVE (NOT AT [facility]) - Abnormal; Notable for the following:  D-Dimer, Quant >20.00 (*)    All other components within normal limits  TSH - Abnormal; Notable for the following:    TSH 5.004 (*)    All other components within normal limits  BASIC METABOLIC PANEL - Abnormal; Notable for the following:    Potassium 2.4 (*)    CO2 6 (*)    Glucose, Bld 185 (*)    BUN 22 (*)    Creatinine, Ser 3.70 (*)    Calcium 6.6 (*)    GFR calc non Af Amer 12 (*)    GFR calc Af Amer 14 (*)    Anion gap 25 (*)    All other components within normal limits  GLUCOSE, CAPILLARY - Abnormal; Notable for the following:    Glucose-Capillary 179 (*)    All other components within normal limits  GLUCOSE, CAPILLARY - Abnormal; Notable for the following:    Glucose-Capillary 154 (*)    All other components within normal limits  I-STAT TROPOININ, ED - Abnormal; Notable for the following:    Troponin i, poc 0.54 (*)    All other components within normal limits  I-STAT CG4 LACTIC ACID, ED - Abnormal; Notable for the following:    Lactic Acid, Venous 10.76 (*)    All other components within normal limits  I-STAT ARTERIAL BLOOD GAS, ED - Abnormal; Notable for the following:    pH, Arterial 7.280 (*)    pCO2 arterial 27.8 (*)    pO2, Arterial 139.0 (*)    Bicarbonate 13.1 (*)    Acid-base deficit 12.0 (*)    All other components within normal limits  I-STAT CG4 LACTIC ACID, ED - Abnormal; Notable for the following:    Lactic Acid, Venous 11.73 (*)    All other  components within normal limits  CBG MONITORING, ED - Abnormal; Notable for the following:    Glucose-Capillary 108 (*)    All other components within normal limits  I-STAT VENOUS BLOOD GAS, ED - Abnormal; Notable for the following:    pH, Ven 7.192 (*)    pCO2, Ven 34.0 (*)    pO2, Ven 83.0 (*)    Bicarbonate 13.0 (*)    Acid-base deficit 14.0 (*)    All other components within normal limits  MRSA PCR SCREENING  CULTURE, BLOOD (ROUTINE X 2)  CULTURE, BLOOD (ROUTINE X 2)  C DIFFICILE QUICK SCREEN W PCR REFLEX  LIPASE, BLOOD  PROCALCITONIN  CORTISOL  MAGNESIUM  TROPONIN I  URINALYSIS, ROUTINE W REFLEX MICROSCOPIC (NOT AT [facility])  BLOOD GAS, VENOUS  BASIC METABOLIC PANEL  BASIC METABOLIC PANEL  CBC  BASIC METABOLIC PANEL  BLOOD GAS, ARTERIAL  MAGNESIUM  PHOSPHORUS  BASIC METABOLIC PANEL  I-STAT CHEM 8, ED    Imaging Review Dg Chest Portable 1 View  12/05/2014  CLINICAL DATA:  Central line placement.  Post CPR. EXAM: PORTABLE CHEST 1 VIEW COMPARISON:  11/25/2014 FINDINGS: Interval placement of a LEFT central venous line with tip in the distal SVC. Endotracheal tube and NG tube unchanged. The low lung volumes similar prior. Enlarged cardiac silhouette. No overt pulmonary edema. Mild central venous congestion. IMPRESSION: 1. Interval placement of LEFT central venous line without pneumothorax. 2. Intubated patient with low lung volumes and central venous congestion. Electronically Signed   By: Suzy Bouchard M.D.   On: 12/07/2014 13:55   Dg Chest Portable 1 View  11/14/2014  CLINICAL DATA:  Found unresponsive on toilet. EXAM: PORTABLE CHEST 1 VIEW COMPARISON:  None. FINDINGS: Patient slightly  rotated to the left. Endotracheal tube with tip 3.2 cm above the carina. Nasogastric tube courses through the region of the stomach as tip is not visualized. Lungs are hypoinflated with no definite consolidation or effusion. Mild prominence of the perihilar markings likely due to the degree  of hypoinflation. Borderline cardiomegaly. Mild calcified plaque over the thoracic aorta. There are degenerative changes of the spine. IMPRESSION: Hypoinflation without acute cardiopulmonary disease. Borderline cardiomegaly. Tubes and lines as described. Electronically Signed   By: Marin Olp M.D.   On: 11/14/2014 12:40   I have personally reviewed and evaluated these images and lab results as part of my medical decision-making.   EKG Interpretation None      Emergency Ultrasound:  US Guidance for needle guidance  Performed by Dr. Billy Fischer Indication: needs IV access Linear probe used in real-time to visualize location of needle entry through skin. Interpretation: appropriate placement of peripheral IV in right brachial vein  CRITICAL CARE: Post CPR Performed by: Alvino Chapel   Total critical care time: 45 minutes  Critical care time was exclusive of separately billable procedures and treating other patients.  Critical care was necessary to treat or prevent imminent or life-threatening deterioration.  Critical care was time spent personally by me on the following activities: development of treatment plan with patient and/or surrogate as well as nursing, discussions with consultants, evaluation of patient's response to treatment, examination of patient, obtaining history from patient or surrogate, ordering and performing treatments and interventions, ordering and review of laboratory studies, ordering and review of radiographic studies, pulse oximetry and re-evaluation of patient's condition.   MDM   Final diagnoses:  Cardiac arrest ([place])  PEA (Pulseless electrical activity) ([place])  Hypokalemia  Encephalopathy  Shock ([place])   64yo female with history of liver cancer, hypertension, DM, presents with concern for post cardiac arrest. Patient was seen 26minutes prior to being found unconscious in the bathroom.  EMS reports PEA on initial arrival, and pt received two  rounds of epinephrine prior to ROSC.  King airway was initially placed and pt was given versed given she was gagging with the tube, however balloon on [place] Pace broke and it was removed and pt appeared to have adequate spontaneous respirations and was brought into the ED on nonrebreather.  On arrival to the ED, patient initially with pulses, normal blood pressures.  Patient intubated for airway protection without medications, although had some laryngeal spasm and etomidate given as tube placed. Difficulty obtaining IV and IV placed under [place]. Blood cx x1 obtained and pt given vanc/zosyn empirically given sepsis on differential and initially did not have more hx.   Patient with normal BP initially after intubation, however BP decreased to [id number] systolic which was confirmed by manual and she was given more IVF and started on levophed.  Discussed with critical care prior to decrease in BP, however critical care to bedside at time of evaluation for hypotension.  Troponin .54, possibly secondary to compressions, however given possible cardiac etiology, Cardiology consulted. Other IStat labs significant for lactic acid of 10, potassium of less than 2. Initially did not replace potassium given unclear accuracy of this istat value, particularly in setting of cardiac arrest/lactic acidosis. However, CMP confirmed low potassium and replacement has been initiated by critical care and central line placed.  Pt to be admitted to ICU for continued care. Hypothermia not indicated given unwitnessed arrest. Given hx of not eating or drinking provided by family, low potassium, AKI, feel presentation likely secondary to severe  malnutrition, dehydration, electrolyte abnormalities and do not feel other diagnostic evaluation in ED is indicated at this time.    Gareth Morgan, MD 11/12/2014 2149

## 2014-12-04 NOTE — Progress Notes (Signed)
[place] Progress Note Patient Name: Yesenia Lyons DOB: 08-25-1950 MRN: [id number]   Date of Service  11/13/2014  HPI/Events of Note  Multiple issues: 1. K+ = 2.4 and Creatinine = 3.7 and 2. CVP = 2.  eICU Interventions  Will order: 1. Replete K+. 2. Bolus with 0.9 NaCl 1 liter IV over 1 hour now.     Intervention Category Intermediate Interventions: Hypovolemia - evaluation and management;Electrolyte abnormality - evaluation and management  Sommer,Steven Eugene 11/13/2014, 8:25 PM

## 2014-12-04 NOTE — ED Notes (Signed)
BP re-checked with doppler.  30/palp.  Dr Billy Fischer at bedside.  Fluids given.

## 2014-12-04 NOTE — Progress Notes (Signed)
[place] Progress Note Patient Name: Yesenia Lyons DOB: 01-07-1951 MRN: [id number]   Date of Service  11/09/2014  HPI/Events of Note  Diarrhea - 3+ BM's. Loose and mucoid.  eICU Interventions  Will order: 1. Stool for C. Difficile Toxin. 2. Place Flexiseal.       Intervention Category Intermediate Interventions: Other:  Lysle Dingwall 11/28/2014, 9:27 PM

## 2014-12-04 NOTE — H&P (Signed)
PULMONARY / CRITICAL CARE MEDICINE   Name: Yesenia Lyons MRN: [id number] DOB: 11/17/1950    ADMISSION DATE:  11/11/2014 CONSULTATION DATE: 11/27  REFERRING MD :  EDP, DR Billy Fischer  CHIEF COMPLAINT:  arrest  HISTORY OF PRESENT ILLNESS:   64 yr old female, only known history thus far liver cancer. No other history available. Was on toilet, had syncope and arrest. Unwitnessed. AMS arrived. Epi x 2 runs, acls. PEA was initial rhythm, about 6 min to rosc. Arrived to ER, king airway changed as ballon had popped. IN ed, shock, k low error?, ecg without St changes or new bundle. NOT tachy. labsd pending  PAST MEDICAL HISTORY :  She  has no past medical history on file.  Liver cancer  PAST SURGICAL HISTORY: She  has no past surgical history on file.  Scars on abdo noted, unknown etiology  Allergies not on file no known allergy  No current facility-administered medications on file prior to encounter.   No current outpatient prescriptions on file prior to encounter.  med; 1. Chemo agent - everolimus, lasix 40 tabs, qvar, spironolactone, amlodioine / benazopril, hydralazine, oxycodone, zofran  FAMILY HISTORY:  Her has no family status information on file. no one to give, she is vented  SOCIAL HISTORY: She   unknown, vengted arrest, no one to give  REVIEW OF SYSTEMS:   Unable, vented coded  SUBJECTIVE: profound shock  VITAL SIGNS: BP 65/47 mmHg  Pulse 61  Temp(Src) 97.9 F (36.6 C)  Resp 9  Wt 133 kg (293 lb 3.4 oz)  SpO2 99%  HEMODYNAMICS:    VENTILATOR SETTINGS: Vent Mode:  [-] PRVC FiO2 (%):  [100 %] 100 % Set Rate:  [14 bmp] 14 bmp Vt Set:  [500 mL] 500 mL PEEP:  [5 cmH20] 5 cmH20 Plateau Pressure:  [23 cmH20] 23 cmH20  INTAKE / OUTPUT:    PHYSICAL EXAMINATION: General:  moveing eyes purposeful, perrl Neuro:  Moving upper ext, just followed commands now HEENT:  Obese, dry skin Cardiovascular:  s1 s2 RRR distant Lungs:  ronchi Abdomen:  Old scars, obese, NT,  ND., ro r/g Musculoskeletal:  IO rt, nonpiut edema Skin:  dry  LABS:  CBC  Recent Labs Lab 12/05/2014 1150  WBC 13.9*  HGB 11.8*  HCT 38.5  PLT 191   Coag's No results for input(s): APTT, INR in the last 168 hours. BMET No results for input(s): NA, K, CL, CO2, BUN, CREATININE, GLUCOSE in the last 168 hours. Electrolytes No results for input(s): CALCIUM, MG, PHOS in the last 168 hours. Sepsis Markers  Recent Labs Lab 11/25/2014 1207  LATICACIDVEN 10.76*   ABG No results for input(s): PHART, PCO2ART, PO2ART in the last 168 hours. Liver Enzymes No results for input(s): AST, ALT, ALKPHOS, BILITOT, ALBUMIN in the last 168 hours. Cardiac Enzymes No results for input(s): TROPONINI, PROBNP in the last 168 hours. Glucose No results for input(s): GLUCAP in the last 168 hours.  Imaging Dg Chest Portable 1 View  12/03/2014  CLINICAL DATA:  Found unresponsive on toilet. EXAM: PORTABLE CHEST 1 VIEW COMPARISON:  None. FINDINGS: Patient slightly rotated to the left. Endotracheal tube with tip 3.2 cm above the carina. Nasogastric tube courses through the region of the stomach as tip is not visualized. Lungs are hypoinflated with no definite consolidation or effusion. Mild prominence of the perihilar markings likely due to the degree of hypoinflation. Borderline cardiomegaly. Mild calcified plaque over the thoracic aorta. There are degenerative changes of the spine. IMPRESSION: Hypoinflation without  acute cardiopulmonary disease. Borderline cardiomegaly. Tubes and lines as described. Electronically Signed   By: Marin Olp M.D.   On: 11/19/2014 12:40     STUDIES:  Echo 11/27>>>  CULTURES: BC 11/27>>>  ANTIBIOTICS:   SIGNIFICANT EVENTS: 11/27- arrest 56 min , pea , unwitnessed   LINES/TUBES: 11/27 left IJ>>> 11/27 ett>>> 11/27 rt fem a line>>>  ASSESSMENT / PLAN:  PULMONARY A: Acute resp failure P:   STAT ABG on current rate / mv With LActic likely will require higher  MV Stat pcxr  CARDIOVASCULAR A:  S/p arrest, r/o secondary to hypokalemia, vaso vagal, r/o hypovolemia with underlying valvular dz, refractory shock P:  Levophed to map goal 65 Stat cortisol then stress roids especially with malignancy Add vasopressin with such refractory shock Echo biolus further cvp Stat aline Await labs  RENAL A:   ARF, unknown baseline, lasix diuretic dependent, lactic acidosis post arrest P:  , r/o hypokalemia Chem now Bolus Saline ua cvp  GASTROINTESTINAL A:   LIver cancer P:   LFT, amy, lip Npo ppi  HEMATOLOGIC A:   DVT prevention, Doubt PE P:  Doppler D dimer for neg predictive value coags now scd for now  INFECTIOUS A:  Immunocompromised host, r/o LLL infiltrate  P:   11/27 vanc>>> 11/27 ceftaz>>>  BC Sputum  pcxr in am   ENDOCRINE A:   R/o AI P:   Cortisol Stress roids then empiric tsh ssi  NEUROLOGIC A:   S/p arrest, r/o anoxia P:   RASS goal: -1 She just woke up and followed command, dc head CT fent , may need drip Avoid benzo with liver   FAMILY  - Updates: entiure family   - Inter-disciplinary family meet or Palliative Care meeting due by:  12/1  Ccm time 90 min  Family to be updated Lavon Paganini. Titus Mould, MD, [place] Pgr: [facility]  Pulmonary and [place] Pager: [phone]  12/03/2014, 1:08 PM

## 2014-12-04 NOTE — Progress Notes (Signed)
[place] Progress Note Patient Name: Lamees Lynam DOB: [birth year] MRN: [id number]   Date of Service  11/15/2014  HPI/Events of Note  Multiple issues: 1. Ca++ = 6.6 and albumin = 1.9. Ca++ corrects to 8.28, 2. Lactic Acid = 11.73 and pH = 7.19 earlier this afternoon and 3. Ammonia = 74.  eICU Interventions  Will order: 1. Replete Ca++. 2. Repeat ABG and Lactic Acid level now.  3. Lactulose 30 gm PO TID.     Intervention Category Major Interventions: Acid-Base disturbance - evaluation and management;Other: Intermediate Interventions: Electrolyte abnormality - evaluation and management  Bliss Behnke [place] 11/09/2014, 10:56 PM

## 2014-12-04 NOTE — Procedures (Signed)
Intubation Procedure Note Murlee Huaman [id number] Oct 21, 1950  Procedure: Intubation Indications: Respiratory insufficiency  Procedure Details Consent: Unable to obtain consent because of emergent medical necessity. Time Out: Verified patient identification, verified procedure, site/side was marked, verified correct patient position, special equipment/implants available, medications/allergies/relevent history reviewed, required imaging and test results available.  Performed  Maximum sterile technique was used including gloves, gown, hand hygiene and mask.  MAC and 3    Evaluation Hemodynamic Status: BP stable throughout; O2 sats: stable throughout Patient's Current Condition: stable Complications: No apparent complications Patient did tolerate procedure well. Chest X-ray ordered to verify placement.  CXR: pending.  Pt intubated following cardiac arrest for respiratory insufficiency. Pt had King airway prior to ED arrival but balloon popped and airway was removed. Pt brought into ED with NRB in place with shallow respirations. Pt intubated using Glidescope #3 blade with 7.5 ett secured at 23 at the lip. Positive color change, direct visualization, bilateral BS, CXR pending. RT will continue to monitor.    Jesse Sans 11/21/2014

## 2014-12-04 NOTE — ED Notes (Signed)
Pharmacy called to send potassium.  STated they are tubing it.

## 2014-12-04 NOTE — Progress Notes (Signed)
Transported pt from ED trauma B to 75M where pt was assigned a room but there was a misunderstanding on pt placement and pt was ultimately transported to 2H01 on ventilator. Report given to receiving RT at bedside. Rt will continue to monitor.

## 2014-12-04 NOTE — Procedures (Signed)
Arterial Catheter Insertion Procedure Note Ichelle Schoof [id number] March 10, 1950  Procedure: Insertion of Arterial Catheter  Indications: Blood pressure monitoring and Frequent blood sampling  Procedure Details Consent: Unable to obtain consent because of emergent medical necessity. Time Out: Verified patient identification, verified procedure, site/side was marked, verified correct patient position, special equipment/implants available, medications/allergies/relevent history reviewed, required imaging and test results available.  Performed  Maximum sterile technique was used including antiseptics, cap, gloves, gown, hand hygiene, mask and sheet. Skin prep: Chlorhexidine; local anesthetic administered 20 gauge catheter was inserted into right femoral artery using the Seldinger technique.  Evaluation Blood flow good; BP tracing good. Complications: No apparent complications.   Raylene Miyamoto 11/22/2014  No radials Failed by RT radials [place]  I DID THIS PROCEDU MYSELF Lavon Paganini. Titus Mould, MD, [place] Pgr: [facility]

## 2014-12-04 NOTE — Progress Notes (Signed)
[place] Progress Note Patient Name: Driana Hughett DOB: [birth year] MRN: [id number]   Date of Service  11/16/2014  HPI/Events of Note  High AG metab acidosis Lactate rising, nnml BP , on pressors  eICU Interventions  Change fluids to bicarb gtt     Intervention Category Major Interventions: Acid-Base disturbance - evaluation and management  ALVA,RAKESH V. 11/25/2014, 11:28 PM

## 2014-12-04 NOTE — Progress Notes (Signed)
CRITICAL VALUE ALERT  Critical value received: pot=2.4  Date of notification:  11/27  Time of notification:  2018  Critical value read back:yes  Nurse who received alert:  Deloris Ping   MD notified (1st page): sommer  Time of first page:    MD notified (2nd page):  Time of second page:  Responding MD:    Time MD responded:

## 2014-12-04 NOTE — ED Notes (Signed)
Family saw pt 30 min before finding her unresponsive sitting on toilet.  When EMS arrived pt was apneic in PEA.  2 epi's given and pt regained spontaneous pulses.  King airway placed and 5 mg versed given d/t pt gagging.King airway pulled d/t malfunction.  Pt maintaining airway with nrb.  Pulses present.  CBG 186.

## 2014-12-04 NOTE — Consult Note (Signed)
   Reason for Consult:PEA arrest with elevated troponin  Referring Physician: Dr. Mayra Reel is an 64 y.o. female.   HPI: The patient is a 64 yo woman with liver CA and renal insufficiency who presented to the ED after a PEA arrest. The patient was using the bathroom when last normal and awake. She was found without respiration of pulse in the bathroom. CPR in the field restored spontaneous circulation. She was intubated. He initial troponin slightly elevated at 0.5 and potassium very low at 2.0. Also noted to have stage 5 renal failure. Unclear of the time course of this. Her initial ECG did not show any acute STT changes or bundle branch block. She is hypotensive.  PMH:No past medical history on file. See above  PSHX:No past surgical history on file.Unable to obtain. No family  FAMHX:No family history on file. Unable to obtain, no family  Social History:  has no tobacco, alcohol, and drug history on file.  Allergies: Allergies not on file  Medications: I have reviewed the patient's current medications.  Dg Chest Portable 1 View  11/21/2014  CLINICAL DATA:  Found unresponsive on toilet. EXAM: PORTABLE CHEST 1 VIEW COMPARISON:  None. FINDINGS: Patient slightly rotated to the left. Endotracheal tube with tip 3.2 cm above the carina. Nasogastric tube courses through the region of the stomach as tip is not visualized. Lungs are hypoinflated with no definite consolidation or effusion. Mild prominence of the perihilar markings likely due to the degree of hypoinflation. Borderline cardiomegaly. Mild calcified plaque over the thoracic aorta. There are degenerative changes of the spine. IMPRESSION: Hypoinflation without acute cardiopulmonary disease. Borderline cardiomegaly. Tubes and lines as described. Electronically Signed   By: Marin Olp M.D.   On: 12/03/2014 12:40    ROS  As stated in the HPI and negative for all other systems.  Physical Exam  Vitals:Blood pressure 71/35,  pulse 61, temperature 97.5 F (36.4 C), resp. rate 11, weight 293 lb 3.4 oz (133 kg), SpO2 95 %.  Chronically ill appearing and looking older than her stated age, intubated  HEENT: ETT in place Neck:  Unable to assess JVD Lymphatics:  No adenopathy Back:  No CVA tenderness Lungs:  Scattered rales and rhonchi HEART:  Regular rate rhythm, no murmurs, no rubs, no clicks Abd:  obese, positive bowel sounds, no organomegally, no rebound, no guarding Ext:  2 plus pulses, woody edema, no cyanosis Skin:  No rashes no nodules Neuro:  unresponsive ECG - NSR with nonspecific T wave abnormality  Assessment/Plan: 1. PEA arrest - etiology of her arrest is unclear. She could have had VF in the setting of marked hypokalemia. Will request a 2D echo although her body habitus will make this difficult. Check serial cardiac enzymes though I do not think the etiology of her arrest is coronary ischemia. Consider D dimer.  2. Hypotension - Dr. Titus Mould and associates to place central access. She will need pressors and fluid support 3. Encephalopathy - if she survives, will likely need neuro consult 4. Liver CA - nothing in our system. Will need to obtain old records.  5. Stage 5 renal failure - her arrest and down time and hypotension will not help her chronic renal dysfunction. Her prognosis seems very poor to me. Our team will followup echo.  Carleene Overlie TaylorMD 11/28/2014, 1:48 PM

## 2014-12-04 NOTE — ED Notes (Signed)
Dr Wyvonna Plum at bedside placing central line.

## 2014-12-05 ENCOUNTER — Encounter (HOSPITAL_COMMUNITY): Payer: Self-pay

## 2014-12-05 ENCOUNTER — Inpatient Hospital Stay (HOSPITAL_COMMUNITY): Payer: Medicare Other

## 2014-12-05 DIAGNOSIS — C224 Other sarcomas of liver: Secondary | ICD-10-CM

## 2014-12-05 DIAGNOSIS — I248 Other forms of acute ischemic heart disease: Secondary | ICD-10-CM | POA: Diagnosis present

## 2014-12-05 DIAGNOSIS — R0600 Dyspnea, unspecified: Secondary | ICD-10-CM

## 2014-12-05 DIAGNOSIS — I469 Cardiac arrest, cause unspecified: Secondary | ICD-10-CM

## 2014-12-05 DIAGNOSIS — N189 Chronic kidney disease, unspecified: Secondary | ICD-10-CM

## 2014-12-05 DIAGNOSIS — N179 Acute kidney failure, unspecified: Secondary | ICD-10-CM | POA: Diagnosis present

## 2014-12-05 DIAGNOSIS — G9340 Encephalopathy, unspecified: Secondary | ICD-10-CM

## 2014-12-05 LAB — C DIFFICILE QUICK SCREEN W PCR REFLEX
C DIFFICILE (CDIFF) TOXIN: NEGATIVE
C Diff antigen: NEGATIVE
C Diff interpretation: NEGATIVE

## 2014-12-05 LAB — GLUCOSE, CAPILLARY
GLUCOSE-CAPILLARY: 65 mg/dL (ref 65–99)
GLUCOSE-CAPILLARY: 165 mg/dL — AB (ref 65–99)
GLUCOSE-CAPILLARY: 87 mg/dL (ref 65–99)
GLUCOSE-CAPILLARY: 173 mg/dL — AB (ref 65–99)
GLUCOSE-CAPILLARY: 161 mg/dL — AB (ref 65–99)
GLUCOSE-CAPILLARY: 151 mg/dL — AB (ref 65–99)
Glucose-Capillary: 103 mg/dL — ABNORMAL HIGH (ref 65–99)
Glucose-Capillary: 215 mg/dL — ABNORMAL HIGH (ref 65–99)

## 2014-12-05 LAB — URINE MICROSCOPIC-ADD ON

## 2014-12-05 LAB — URINALYSIS, ROUTINE W REFLEX MICROSCOPIC
Bilirubin Urine: NEGATIVE
Glucose, UA: NEGATIVE mg/dL
Ketones, ur: NEGATIVE mg/dL
NITRITE: NEGATIVE
PH: 5.5 (ref 5.0–8.0)
Protein, ur: 30 mg/dL — AB
SPECIFIC GRAVITY, URINE: 1.006 (ref 1.005–1.030)

## 2014-12-05 LAB — POCT I-STAT 3, ART BLOOD GAS (G3+)
ACID-BASE DEFICIT: 11 mmol/L — AB (ref 0.0–2.0)
BICARBONATE: 11.1 meq/L — AB (ref 20.0–24.0)
O2 Saturation: 99 %
PH ART: 7.42 (ref 7.350–7.450)
Patient temperature: 97.6
TCO2: 12 mmol/L (ref 0–100)
pCO2 arterial: 17.1 mmHg — CL (ref 35.0–45.0)
pO2, Arterial: 135 mmHg — ABNORMAL HIGH (ref 80.0–100.0)

## 2014-12-05 LAB — BASIC METABOLIC PANEL
ANION GAP: 23 — AB (ref 5–15)
ANION GAP: 25 — AB (ref 5–15)
ANION GAP: 21 — AB (ref 5–15)
ANION GAP: 21 — AB (ref 5–15)
BUN: 21 mg/dL — ABNORMAL HIGH (ref 6–20)
BUN: 23 mg/dL — AB (ref 6–20)
BUN: 23 mg/dL — ABNORMAL HIGH (ref 6–20)
BUN: 24 mg/dL — ABNORMAL HIGH (ref 6–20)
CALCIUM: 6.7 mg/dL — AB (ref 8.9–10.3)
CALCIUM: 6.4 mg/dL — AB (ref 8.9–10.3)
CALCIUM: 6.6 mg/dL — AB (ref 8.9–10.3)
CHLORIDE: 109 mmol/L (ref 101–111)
CHLORIDE: 108 mmol/L (ref 101–111)
CHLORIDE: 110 mmol/L (ref 101–111)
CHLORIDE: 107 mmol/L (ref 101–111)
CO2: 9 mmol/L — AB (ref 22–32)
CO2: 8 mmol/L — ABNORMAL LOW (ref 22–32)
CO2: 12 mmol/L — AB (ref 22–32)
CO2: 11 mmol/L — AB (ref 22–32)
Calcium: 6.6 mg/dL — ABNORMAL LOW (ref 8.9–10.3)
Creatinine, Ser: 3.71 mg/dL — ABNORMAL HIGH (ref 0.44–1.00)
Creatinine, Ser: 3.85 mg/dL — ABNORMAL HIGH (ref 0.44–1.00)
Creatinine, Ser: 4.13 mg/dL — ABNORMAL HIGH (ref 0.44–1.00)
Creatinine, Ser: 4.4 mg/dL — ABNORMAL HIGH (ref 0.44–1.00)
GFR calc non Af Amer: 12 mL/min — ABNORMAL LOW (ref >60)
GFR calc non Af Amer: 10 mL/min — ABNORMAL LOW (ref >60)
GFR calc non Af Amer: 10 mL/min — ABNORMAL LOW (ref >60)
GFR, EST AFRICAN AMERICAN: 14 mL/min — AB (ref >60)
GFR, EST AFRICAN AMERICAN: 13 mL/min — AB (ref >60)
GFR, EST AFRICAN AMERICAN: 12 mL/min — AB (ref >60)
GFR, EST AFRICAN AMERICAN: 11 mL/min — AB (ref >60)
GFR, EST NON AFRICAN AMERICAN: 11 mL/min — AB (ref >60)
Glucose, Bld: 176 mg/dL — ABNORMAL HIGH (ref 65–99)
Glucose, Bld: 209 mg/dL — ABNORMAL HIGH (ref 65–99)
Glucose, Bld: 187 mg/dL — ABNORMAL HIGH (ref 65–99)
Glucose, Bld: 172 mg/dL — ABNORMAL HIGH (ref 65–99)
POTASSIUM: 2.5 mmol/L — AB (ref 3.5–5.1)
POTASSIUM: 3.3 mmol/L — AB (ref 3.5–5.1)
Potassium: 2.8 mmol/L — ABNORMAL LOW (ref 3.5–5.1)
Potassium: 3.1 mmol/L — ABNORMAL LOW (ref 3.5–5.1)
SODIUM: 141 mmol/L (ref 135–145)
Sodium: 141 mmol/L (ref 135–145)
Sodium: 143 mmol/L (ref 135–145)
Sodium: 139 mmol/L (ref 135–145)

## 2014-12-05 LAB — CBC
HCT: 39.2 % (ref 36.0–46.0)
HEMOGLOBIN: 12.6 g/dL (ref 12.0–15.0)
MCH: 26 pg (ref 26.0–34.0)
MCHC: 32.1 g/dL (ref 30.0–36.0)
MCV: 80.8 fL (ref 78.0–100.0)
PLATELETS: 157 10*3/uL (ref 150–400)
RBC: 4.85 MIL/uL (ref 3.87–5.11)
RDW: 15.7 % — ABNORMAL HIGH (ref 11.5–15.5)
WBC: 26.7 10*3/uL — ABNORMAL HIGH (ref 4.0–10.5)

## 2014-12-05 LAB — LACTIC ACID, PLASMA: Lactic Acid, Venous: 9.3 mmol/L (ref 0.5–2.0)

## 2014-12-05 LAB — MAGNESIUM: MAGNESIUM: 1.7 mg/dL (ref 1.7–2.4)

## 2014-12-05 LAB — TROPONIN I: Troponin I: 1.95 ng/mL (ref <0.031)

## 2014-12-05 LAB — PHOSPHORUS: PHOSPHORUS: 6 mg/dL — AB (ref 2.5–4.6)

## 2014-12-05 MED ORDER — PERFLUTREN LIPID MICROSPHERE
INTRAVENOUS | Status: AC
  Filled 2014-12-05: qty 10

## 2014-12-05 MED ORDER — SODIUM CHLORIDE 0.9 % IV BOLUS (SEPSIS)
500.0000 mL | Freq: Once | INTRAVENOUS | Status: AC
  Administered 2014-12-05: 500 mL via INTRAVENOUS

## 2014-12-05 MED ORDER — SODIUM CHLORIDE 0.9 % IV SOLN
200.0000 mg | Freq: Once | INTRAVENOUS | Status: AC
  Administered 2014-12-05: 200 mg via INTRAVENOUS
  Filled 2014-12-05: qty 200

## 2014-12-05 MED ORDER — BARIUM SULFATE 2.1 % PO SUSP
900.0000 mL | Freq: Once | ORAL | Status: AC
  Administered 2014-12-05: 900 mL via ORAL

## 2014-12-05 MED ORDER — SODIUM CHLORIDE 0.9 % IV SOLN
INTRAVENOUS | Status: DC
  Administered 2014-12-05: 19:00:00 via INTRAVENOUS

## 2014-12-05 MED ORDER — POTASSIUM CHLORIDE 10 MEQ/50ML IV SOLN
10.0000 meq | INTRAVENOUS | Status: AC
  Administered 2014-12-06 (×3): 10 meq via INTRAVENOUS
  Filled 2014-12-05 (×3): qty 50

## 2014-12-05 MED ORDER — METRONIDAZOLE IN NACL 5-0.79 MG/ML-% IV SOLN
500.0000 mg | Freq: Three times a day (TID) | INTRAVENOUS | Status: DC
Start: 2014-12-05 — End: 2014-12-09
  Administered 2014-12-05 – 2014-12-09 (×11): 500 mg via INTRAVENOUS
  Filled 2014-12-05 (×11): qty 100

## 2014-12-05 MED ORDER — SODIUM CHLORIDE 0.9 % IV SOLN
2.0000 g | Freq: Once | INTRAVENOUS | Status: AC
  Administered 2014-12-05: 2 g via INTRAVENOUS
  Filled 2014-12-05: qty 20

## 2014-12-05 MED ORDER — POTASSIUM CHLORIDE 10 MEQ/50ML IV SOLN
10.0000 meq | INTRAVENOUS | Status: AC
  Administered 2014-12-05 (×4): 10 meq via INTRAVENOUS
  Filled 2014-12-05 (×4): qty 50

## 2014-12-05 MED ORDER — MAGNESIUM SULFATE 2 GM/50ML IV SOLN
2.0000 g | Freq: Once | INTRAVENOUS | Status: AC
  Administered 2014-12-05: 2 g via INTRAVENOUS
  Filled 2014-12-05: qty 50

## 2014-12-05 MED ORDER — SODIUM CHLORIDE 0.9 % IV SOLN
100.0000 mg | INTRAVENOUS | Status: DC
Start: 2014-12-06 — End: 2014-12-06
  Filled 2014-12-05: qty 100

## 2014-12-05 MED ORDER — PERFLUTREN LIPID MICROSPHERE
1.0000 mL | INTRAVENOUS | Status: AC | PRN
  Administered 2014-12-05: 2 mL via INTRAVENOUS
  Filled 2014-12-05: qty 10

## 2014-12-05 MED ORDER — BARIUM SULFATE 2.1 % PO SUSP
ORAL | Status: AC
  Filled 2014-12-05: qty 2

## 2014-12-05 MED ORDER — POTASSIUM CHLORIDE 20 MEQ/15ML (10%) PO SOLN
40.0000 meq | Freq: Once | ORAL | Status: AC
  Administered 2014-12-05: 40 meq
  Filled 2014-12-05: qty 30

## 2014-12-05 MED ORDER — HEPARIN (PORCINE) IN NACL 100-0.45 UNIT/ML-% IJ SOLN
2200.0000 [IU]/h | INTRAMUSCULAR | Status: DC
  Administered 2014-12-05: 1700 [IU]/h via INTRAVENOUS
  Filled 2014-12-05 (×2): qty 250

## 2014-12-05 NOTE — Progress Notes (Signed)
64 yo woman with liver CA and renal insufficiency who presented to the ED after a PEA arrest. The patient was using the bathroom when last normal and awake. She was found without respiration of pulse in the bathroom. CPR in the field restored spontaneous circulation. She was intubated. He initial troponin slightly elevated at 0.5 and potassium very low at 2.0. Also noted to have stage 5 renal failure. Unclear of the time course of this. Her initial ECG did not show any acute STT changes or bundle branch block. She remains hypotensive on pressors. Troponin levels ~2 with rising Cr.   Subjective:  Remains intubated, minimal sedation On Levophed No acute events per RN  Objective:  Vital Signs in the last 24 hours: Temp:  [95.2 F (35.1 C)-98.3 F (36.8 C)] 98.1 F (36.7 C) (11/28 1315) Pulse Rate:  [45-98] 98 (11/28 1500) Resp:  [3-29] 22 (11/28 1549) BP: (88-107)/(51-57) 107/57 mmHg (11/28 0319) SpO2:  [83 %-100 %] 86 % (11/28 1500) Arterial Line BP: (76-111)/(46-67) 88/53 mmHg (11/28 1549) FiO2 (%):  [40 %] 40 % (11/28 1601) Weight:  [289 lb 14.5 oz (131.5 kg)] 289 lb 14.5 oz (131.5 kg) (11/28 0500)  Intake/Output from previous day: 11/27 0701 - 11/28 0700 In: 4400.4 [I.V.:2065.4; NG/GT:75; IV Piggyback:2260] Out: 470 [Urine:20; Emesis/NG output:450] Intake/Output from this shift: Total I/O In: 2129 [I.V.:249; NG/GT:1030; IV Piggyback:850] Out: 0   Physical Exam: General appearance: awake & follows commands. Intubated Neck: no adenopathy, no carotid bruit and no JVD Lungs: diffuse coarse BS bilaterally, but no rales or rhonchi Heart: distant S1 & s2, no M/R/G (possibly due to body habitus) Abdomen: obese, old scars present. + HM & mildly tender to palpation Extremities: mild ~1+ non-pitting edema Pulses: 2+ and symmetric Neurologic: Mental status: alertness: awake & responsive  Lab Results:  Recent Labs  11/10/2014 1437 12/05/14 0600  WBC 22.3* 26.7*  HGB 13.1 12.6    PLT 197 157    Recent Labs  12/05/14 0600 12/05/14 1500  NA 141 143  K 3.3* 2.8*  CL 108 110  CO2 8* 12*  GLUCOSE 209* 187*  BUN 23* 23*  CREATININE 3.85* 4.13*    Recent Labs  11/22/2014 1900 12/05/14 0155  TROPONINI 1.49* 1.95*   Hepatic Function Panel  Recent Labs  11/26/2014 1437  PROT 5.7*  ALBUMIN 1.9*  AST 1173*  ALT 366*  ALKPHOS 384*  BILITOT 2.4*   No results for input(s): CHOL in the last 72 hours. No results for input(s): PROTIME in the last 72 hours.  EKG - NSR, NSST Tele - NSR  Imaging: CXR personally reviewed (11/28) 1. Stable cardiomegaly and low lung volumes. 2. Improved aeration of both lungs with mild bibasilar atelectasis remaining. 3. The support apparatus is stable.  Cardiac Studies:  2 D Echo: - Left ventricle: The cavity size was normal. Mild concentric LVH. Systolic function was normal. EF ~ 50-55% - Severe Apical hypokinesis. Gr 1 Diastolic Dysfunction. Acoustic contrast opacification revealed no evidence ofthrombus. - Mitral valve: Calcified annulus. - Right ventricle: The cavity size was mildly dilated. Systolicfunction was moderately reduced. Hypokinesis of the RV lateral free wall. - Tricuspid valve: There was moderate regurgitation. - Pulmonic valve: There was moderate regurgitation directed centrally. - Pulmonary arteries: PA peak pressure: 40 mm Hg (S). - Severe heterogeneity of the left lobe of the liver, consider neoplasm.  Assessment/Plan:  Principal Problem:   Cardiac arrest ([place]) - PEA Active Problems:   PEA (Pulseless electrical activity) ([place])  Shock ([place])   Demand ischemia ([place])   Renal failure (ARF), acute on chronic (HCC)   Liver cancer ([place])   Hypokalemia   Hypotension   Encephalopathy  Still not clear as to the Etiology of PEA - ? Hypokalemia; No Significant Valvular disease (TR & PR likely related to increased PA pressures).  RV hypokinesis with mild-moderately increased PA pressures - ? PE.  +  Troponin level in the setting of PEA arrest (thought to be primarily respiratory),prolonged down time & hypotension/shock with A on C renal failure --> most consistent with Demand Ischemia.  This is a sign of poor prognosis, but not necessarily c/w ACS/NSTEMI.  Echo with Apical Hypokinesis is concerning for potential Anterior MI (but would expect EF to be worse with LAD related Infarct) -  Unfortunatelyl with worsening renal Fxn & ongoing shock) - no plan for invasive evaluation in the absence of objective data to suggest ongoing coronary ischemia.  Not unreasonable to Rx for 48-72 hrs IV Heparin for ? ACS & ? PE.  With liver Ca - not a great option to add statin BP still too low & on pressors - unable to consider BB No SSx of CHF on exam or CXR.  Shock - remains on pressors per PCCM  Given current multi-organ failure, I agree with DNR status.    LOS: 1 day    HARDING, DAVID W 12/05/2014, 4:57 PM

## 2014-12-05 NOTE — Progress Notes (Signed)
[facility] Progress Note Patient Name: Yesenia Lyons DOB: 01-15-1950 MRN: [id number]   Date of Service  12/05/2014  HPI/Events of Note    eICU Interventions  Hypokalemia -repleted      Intervention Category Intermediate Interventions: Electrolyte abnormality - evaluation and management  Alma Muegge V. 12/05/2014, 2:49 AM

## 2014-12-05 NOTE — Progress Notes (Signed)
Bladder scan=60cc,only 5 cc of urine noted this shift,foley flush and check for placement w/ another RN Jody.

## 2014-12-05 NOTE — Progress Notes (Signed)
[facility] Progress Note Patient Name: Elnora Pulsifer DOB: [birth year] MRN: [id number]   Date of Service  12/05/2014  HPI/Events of Note  Multiple issues: 1. Ca++ = 6.1 and albumin = 1.9. Ca++ corrects to 8.08 and 2. K+ = 2.8 and Creatinine = 4.13.  eICU Interventions  Will order: 1. Replete Ca++ and K+.     Intervention Category Intermediate Interventions: Electrolyte abnormality - evaluation and management  Calinda Stockinger Eugene 12/05/2014, 3:51 PM

## 2014-12-05 NOTE — Progress Notes (Signed)
ANTICOAGULATION CONSULT NOTE - Initial Consult  Pharmacy Consult for Heparin Indication: R/o PE, Elevated D-Dimer  Allergies  Allergen Reactions  . Penicillins Itching and Swelling    Patient Measurements: Height: 6\' 4"  (193 cm) Weight: 289 lb 14.5 oz (131.5 kg) IBW/kg (Calculated) : 82.3 Heparin Dosing Weight: 111 kg  Vital Signs: Temp: 98.1 F (36.7 C) (11/28 1315) Temp Source: Axillary (11/28 1315) BP: 107/57 mmHg (11/28 0319) Pulse Rate: 45 (11/28 0529)  Labs:  Recent Labs  11/21/2014 1150 12/06/2014 1437 11/27/2014 1900 12/05/14 0155 12/05/14 0600  HGB 11.8* 13.1  --   --  12.6  HCT 38.5 43.0  --   --  39.2  PLT 191 197  --   --  157  APTT  --  52*  --   --   --   LABPROT  --  22.3*  --   --   --   INR  --  1.97*  --   --   --   CREATININE 3.87* 3.39* 3.70* 3.71* 3.85*  TROPONINI  --  1.07* 1.49* 1.95*  --     Estimated Creatinine Clearance: 23.5 mL/min (by C-G formula based on Cr of 3.85).   Medical History: Past Medical History  Diagnosis Date  . Diabetes mellitus without complication ([place])   . Hypertension   . Cancer [facility])     Liver cx, diagnosed in July 2013    Medications:  Scheduled:  . [START ON 12/06/2014] anidulafungin  100 mg Intravenous Q24H  . antiseptic oral rinse  7 mL Mouth Rinse 10 times per day  . cefTAZidime (FORTAZ)  IV  2 g Intravenous Q24H  . chlorhexidine gluconate  15 mL Mouth Rinse BID  . insulin aspart  0-9 Units Subcutaneous 6 times per day  . lactulose  30 g Per Tube TID  . metronidazole  500 mg Intravenous Q8H  . pantoprazole (PROTONIX) IV  40 mg Intravenous QHS  . [START ON 12/06/2014] vancomycin  1,750 mg Intravenous Q48H    Assessment: 64 year old female with elevated D-Dimer (> 20) and elevated INR of 1.97 s/p PEA arrest. She is to begin heparin for r/o DVT (Discussed with Dr. Titus Mould; Echo was not convincing for PE). Patient noted with shock liver, with AST/ALT severely elevated. CBC stable. Will omit heparin bolus  due to elevated INR.    Goal of Therapy:  Heparin level 0.3-0.7 units/ml Monitor platelets by anticoagulation protocol: Yes   Plan:  - No heparin bolus (due to elevated INR) - Start heparin drip at 1700 units/hr - Check 8-hr HL - Monitor for s/sx of bleeding  Alfonzo Beers 12/05/2014,2:01 PM  I have reviewed the assessment and agree with the plans.  Hildred Laser, Pharm D 12/05/2014 2:35 PM

## 2014-12-05 NOTE — Progress Notes (Signed)
CRITICAL VALUE ALERT  Critical value received:K=2.5,lactic acid 9  Date of notification:  11/28  Time of notification:  0240  Critical value read back:yes  Nurse who received alert: Deloris Ping   MD notified (1st page): Dr Elsworth Soho  Time of first page:  0250  MD notified (2nd page):  Time of second page:  Responding MD Time MD responded:

## 2014-12-05 NOTE — Progress Notes (Signed)
CRITICAL VALUE ALERT  Critical value received:  Ca 6.4, K 2.8  Date of notification:  11/28  Time of notification:  [id number]  Critical value read back:Yes.    Nurse who received alert:  Dennison Mascot    MD notified (1st page):  Dr. Oletta Darter  Time of first page:  1546  Responding MD:  Dr. Oletta Darter  Time MD responded:  1547. Electrolytes replaced.

## 2014-12-05 NOTE — H&P (Addendum)
PULMONARY / CRITICAL CARE MEDICINE   Name: Yesenia Lyons MRN: [id number] DOB: [birth year]    ADMISSION DATE:  12/05/2014 CONSULTATION DATE: 11/27  REFERRING MD :  EDP, DR Schlossman  CHIEF COMPLAINT:  arrest  HISTORY OF PRESENT ILLNESS:   64 yr old female, h/o carcinoid tumor liver, chronic diarhea, poor intake, coded pea home on toilet 6 min , K noted low  SUBJECTIVE: awakens fc  VITAL SIGNS: BP 107/57 mmHg  Pulse 45  Temp(Src) 98.3 F (36.8 C) (Oral)  Resp 28  Ht 6\' 4"  (1.93 m)  Wt 131.5 kg (289 lb 14.5 oz)  BMI 35.30 kg/m2  SpO2 97%  HEMODYNAMICS: CVP:  [2 mmHg-9 mmHg] 8 mmHg  VENTILATOR SETTINGS: Vent Mode:  [-] PRVC FiO2 (%):  [40 %-100 %] 40 % Set Rate:  [14 bmp-28 bmp] 28 bmp Vt Set:  [500 mL-660 mL] 660 mL PEEP:  [5 cmH20] 5 cmH20 Plateau Pressure:  [23 cmH20-28 cmH20] 28 cmH20  INTAKE / OUTPUT: I/O last 3 completed shifts: In: 4400.4 [I.V.:2065.4; NG/GT:75; IV Piggyback:2260] Out: 470 [Urine:20; Emesis/NG output:450]  PHYSICAL EXAMINATION: General: awakens Neuro:  Moving upper ext, just followed commands, int awaken, rass 1 HEENT:  Obese, dry skin Cardiovascular:  s1 s2 RRR distant Lungs:  coarse Abdomen:  Old scars, obese, Tenderness mild , ND., ro r/g Musculoskeletal:  nonpiut edema Skin:  dry  LABS:  CBC  Recent Labs Lab 11/09/2014 1150 11/29/2014 1437 12/05/14 0600  WBC 13.9* 22.3* 26.7*  HGB 11.8* 13.1 12.6  HCT 38.5 43.0 39.2  PLT 191 197 157   Coag's  Recent Labs Lab 11/18/2014 1437  APTT 52*  INR 1.97*   BMET  Recent Labs Lab 11/26/2014 1900 12/05/14 0155 12/05/14 0600  NA 141 141 141  K 2.4* 2.5* 3.3*  CL 110 109 108  CO2 6* 9* 8*  BUN 22* 21* 23*  CREATININE 3.70* 3.71* 3.85*  GLUCOSE 185* 176* 209*   Electrolytes  Recent Labs Lab 11/20/2014 1437 11/10/2014 1900 12/05/14 0155 12/05/14 0600  CALCIUM 6.5* 6.6* 6.7* 6.6*  MG 1.7  --   --  1.7  PHOS 6.4*  --   --  6.0*   Sepsis Markers  Recent Labs Lab  11/08/2014 1437 11/13/2014 1453 12/03/2014 2305 12/05/14 0155  LATICACIDVEN 9.8* 11.73* 10.9* 9.3*  PROCALCITON 3.91  --   --   --    ABG  Recent Labs Lab 12/03/2014 1321 12/03/2014 2314 12/05/14 0616  PHART 7.280* 7.316* 7.420  PCO2ART 27.8* 18.2* 17.1*  PO2ART 139.0* 144.0* 135.0*   Liver Enzymes  Recent Labs Lab 11/15/2014 1150 11/12/2014 1437  AST 666* 1173*  ALT 156* 366*  ALKPHOS 360* 384*  BILITOT 2.1* 2.4*  ALBUMIN 2.1* 1.9*   Cardiac Enzymes  Recent Labs Lab 11/13/2014 1437 11/12/2014 1900 12/05/14 0155  TROPONINI 1.07* 1.49* 1.95*   Glucose  Recent Labs Lab 12/05/2014 1133 11/20/2014 1636 11/30/2014 2003 11/14/2014 2313 12/05/14 0409  GLUCAP 108* 179* 154* 135* 165*    Imaging Dg Chest Port 1 View  12/05/2014  CLINICAL DATA:  Cardiopulmonary arrest. EXAM: PORTABLE CHEST - 1 VIEW COMPARISON:  One-view chest x-ray 11/27/2014 FINDINGS: Cardiac enlargement is stable. Pulmonary congestion is improved. The the lung volumes remain low. Minimal bibasilar airspace disease likely reflects atelectasis. The endotracheal tube, left IJ line, and NG tube are stable. IMPRESSION: 1. Stable cardiomegaly and low lung volumes. 2. Improved aeration of both lungs with mild bibasilar atelectasis remaining. 3. The support apparatus is stable. Electronically Signed  By: San Morelle M.D.   On: 12/05/2014 06:54   Dg Chest Portable 1 View  2014/12/19  CLINICAL DATA:  Central line placement.  Post CPR. EXAM: PORTABLE CHEST 1 VIEW COMPARISON:  2014/12/19 FINDINGS: Interval placement of a LEFT central venous line with tip in the distal SVC. Endotracheal tube and NG tube unchanged. The low lung volumes similar prior. Enlarged cardiac silhouette. No overt pulmonary edema. Mild central venous congestion. IMPRESSION: 1. Interval placement of LEFT central venous line without pneumothorax. 2. Intubated patient with low lung volumes and central venous congestion. Electronically Signed   By: Suzy Bouchard M.D.   On: 12/19/2014 13:55   Dg Chest Portable 1 View  12-19-2014  CLINICAL DATA:  Found unresponsive on toilet. EXAM: PORTABLE CHEST 1 VIEW COMPARISON:  None. FINDINGS: Patient slightly rotated to the left. Endotracheal tube with tip 3.2 cm above the carina. Nasogastric tube courses through the region of the stomach as tip is not visualized. Lungs are hypoinflated with no definite consolidation or effusion. Mild prominence of the perihilar markings likely due to the degree of hypoinflation. Borderline cardiomegaly. Mild calcified plaque over the thoracic aorta. There are degenerative changes of the spine. IMPRESSION: Hypoinflation without acute cardiopulmonary disease. Borderline cardiomegaly. Tubes and lines as described. Electronically Signed   By: Marin Olp M.D.   On: December 19, 2014 12:40     STUDIES:  Echo 11/27>>>  CULTURES: BC 11/27>>> cdiff neg 12-19-22  ANTIBIOTICS: ceftaz 11/27>>> vanc 11/27>>>  SIGNIFICANT EVENTS: 12-19-2022- arrest 56 min , pea , unwitnessed , low K , electrolytes issue  LINES/TUBES: 12-19-22 left IJ>>> 12/19/22 ett>>> 12-19-2022 rt fem a line>>>  ASSESSMENT / PLAN:  PULMONARY A: Acute resp failure P:  ABg reviewed, reduce rate After rate reduction, SBT likely if levophed reduced in right direction pcxr in am    CARDIOVASCULAR A:  S/p arrest, r/o secondary to hypokalemia, vaso vagal, r/o hypovolemia with underlying valvular dz, refractory shock P:  Levophed to map 60, vasopressin on board for now cvp 8, bolus further Keep stress roids Echo get now, assess rv with dopplers, may need empiric treatment PE Trop  From cpr  RENAL A:   ARF, unknown baseline, lasix diuretic dependent, lactic acidosis post arrest, huge AG remains, r;o dead bowel P:  , hypokalemia, hypomag Chem q8h Some drop lactic No indication bicarb unless K rise, will increase co2 Saline 125 k supp, mag  GASTROINTESTINAL A:   LIver cancer carcinoid, r/o dead bowel, shock  liver P:   LFT in am  For CT stat abdo STAT Npo ppi  HEMATOLOGIC A:   DVT prevention, Doubt PE, coagulapthy on own ( liver carcinoid) P:  Doppler awated D dimer for neg predictive value not utilized No correct coags, until we r/o PE with doppler, and rv on echo scd for now  INFECTIOUS A:  Immunocompromised host, r/o dead bowel  P:   19-Dec-2022 vanc>>> 19-Dec-2022 ceftaz>>>  STAT CT Add flagyl for abdo Add empiric anidulofungin  ENDOCRINE A:   No evidence AI P:   Cortisol 42, dc hydrocortisone tsh 5 ssi  NEUROLOGIC A:   S/p arrest, r/o anoxia P:   RASS goal: -1 fent , wua Avoid benzo with liver   FAMILY  - Updates: daughter Dec 19, 2022, 5   - Inter-disciplinary family meet or Palliative Care meeting due by:  12/1  Ccm time 40 min   Family to be updated Lavon Paganini. Titus Mould, MD, [place] Pgr: [facility]  D/w daughter  I have had extensive discussions with family daughter. We discussed patients current circumstances and organ failures. We also discussed patient's prior wishes under circumstances such as this. Family has decided to NOT perform resuscitation if arrest but to continue current medical support for now. ALl other therpay OKAY, surgery etc

## 2014-12-05 NOTE — Care Management Note (Signed)
Case Management Note  Patient Details  Name: Yesenia Lyons MRN: [id number] Date of Birth: 08-Dec-1950  Subjective/Objective:       Adm w cardiac arrest,vent             Action/Plan: lives w fam   Expected Discharge Date:                  Expected Discharge Plan:     In-House Referral:     Discharge planning Services     Post Acute Care Choice:    Choice offered to:     DME Arranged:    DME Agency:     HH Arranged:    [place] Agency:     Status of Service:     Medicare Important Message Given:    Date Medicare IM Given:    Medicare IM give by:    Date Additional Medicare IM Given:    Additional Medicare Important Message give by:     If discussed at [place] of Stay Meetings, dates discussed:    Additional Comments:ur review done  Lacretia Leigh, RN 12/05/2014, 8:17 AM

## 2014-12-05 NOTE — Progress Notes (Signed)
[place] Progress Note Patient Name: Haliyah Trunk DOB: 24-Jul-1950 MRN: [id number]   Date of Service  12/05/2014  HPI/Events of Note  A-line not functioning.   eICU Interventions  Will order A-line replaced by Respiratory Therapy.      Intervention Category Intermediate Interventions: Hypotension - evaluation and management  Ralyn Stlaurent Eugene 12/05/2014, 6:05 PM

## 2014-12-05 NOTE — Progress Notes (Signed)
Preliminary results by tech  - Venous Duplex Lower Ext. Completed. No evidence of acute deep or superficial vein thrombosis in the veins that were clearly visualized. The calf vein were not visualized due to patient's body habitus.  DVT cannot be excluded at this level. Oda Cogan, BS, RDMS, RVT

## 2014-12-05 NOTE — Progress Notes (Signed)
[place] Progress Note Patient Name: Yesenia Lyons DOB: July 26, 1950 MRN: [id number]   Date of Service  12/05/2014  HPI/Events of Note    eICU Interventions  Hypokalemia -repleted      Intervention Category Intermediate Interventions: Electrolyte abnormality - evaluation and management  [place] Kirchner V. 12/05/2014, 11:51 PM

## 2014-12-05 NOTE — Procedures (Signed)
Arterial Catheter Insertion Procedure Note Kyiah Saca [id number] 12/03/1950  Procedure: Insertion of Arterial Catheter  Indications: Blood pressure monitoring  Procedure Details Consent: Risks of procedure as well as the alternatives and risks of each were explained to the (patient/caregiver).  Consent for procedure obtained. Time Out: Verified patient identification, verified procedure, site/side was marked, verified correct patient position, special equipment/implants available, medications/allergies/relevent history reviewed, required imaging and test results available.  Performed  Maximum sterile technique was used including cap, gloves, gown, hand hygiene, mask and sheet. Skin prep: Chlorhexidine; local anesthetic administered 22 gauge catheter was inserted into right radial artery using the Seldinger technique.  Evaluation Blood flow good; BP tracing good. Complications: No apparent complications.   Gonzella Lex 12/05/2014

## 2014-12-05 NOTE — Progress Notes (Signed)
Initial Nutrition Assessment  DOCUMENTATION CODES:   Obesity unspecified  INTERVENTION:   -If unable to extubate within 48 hours of intubation, recommend:  Initiate Vital High Protein @ 20 ml/hr via OGT and increase by 10 ml every 4 hours to goal rate of 75 ml/hr.   30 ml Prostat daily.    Tube feeding regimen provides 1900 kcal (100% of needs), 173 grams of protein, and 1505 ml of H2O.   NUTRITION DIAGNOSIS:   Inadequate oral intake related to inability to eat as evidenced by NPO status.  GOAL:   Provide needs based on ASPEN/SCCM guidelines  MONITOR:   Vent status, Labs, Weight trends, Skin, I & O's  REASON FOR ASSESSMENT:   Ventilator    ASSESSMENT:   64 yr old female, h/o carcinoid tumor liver, chronic diarhea, poor intake, coded pea home on toilet 6 min , K noted low  Pt admitted with cardiac arrest.   Patient is currently intubated on ventilator support. OGT in place.  MV: 14.3 L/min Temp (24hrs), Avg:97 F (36.1 C), Min:95.2 F (35.1 C), Max:98.4 F (36.9 C)  Propofol: n/a   Pt with persistent diarrhea; rectal tube placed 12/05/14. C-diff negative PCCM ordered STAT CT of abdomen and chest to assess for dead bowel.   Nutrition-Focused physical exam completed. Findings are no fat depletion, no muscle depletion, and mild edema.   Per chart review, pt with decreased appetite for 3-4 days PTA. During that time period, pt was consuming mainly water and Ensure supplements.   Per MD notes, pt with poor prognosis.   Labs reviewed: K: 3.3 (on IV supplementation), Phos: 6.0, Mg WDL. CBG: 215.  Diet Order:  Diet NPO time specified  Skin:  Wound (see comment) (bilateral leg cellulitis)  Last BM:  12/06/2014  Height:   Ht Readings from Last 1 Encounters:  12/07/2014 6\' 4"  (1.93 m)    Weight:   Wt Readings from Last 1 Encounters:  12/05/14 289 lb 14.5 oz (131.5 kg)    Ideal Body Weight:  81.8 kg  BMI:  Body mass index is 35.3 kg/(m^2).  Estimated  Nutritional Needs:   Kcal:  [id number]  Protein:  >164 grams  Fluid:  per MD  EDUCATION NEEDS:   No education needs identified at this time  Yesenia Lyons, RD, LDN, CDE Pager: [phone] After hours Pager: [phone]

## 2014-12-05 NOTE — Progress Notes (Signed)
  Echocardiogram 2D Echocardiogram has been performed.  Darlina Sicilian M 12/05/2014, 9:35 AM

## 2014-12-06 ENCOUNTER — Encounter (HOSPITAL_COMMUNITY): Payer: Self-pay | Admitting: Physician Assistant

## 2014-12-06 ENCOUNTER — Inpatient Hospital Stay (HOSPITAL_COMMUNITY): Payer: Medicare Other

## 2014-12-06 DIAGNOSIS — R933 Abnormal findings on diagnostic imaging of other parts of digestive tract: Secondary | ICD-10-CM

## 2014-12-06 DIAGNOSIS — R197 Diarrhea, unspecified: Secondary | ICD-10-CM

## 2014-12-06 LAB — BASIC METABOLIC PANEL
ANION GAP: 17 — AB (ref 5–15)
Anion gap: 13 (ref 5–15)
BUN: 20 mg/dL (ref 6–20)
BUN: 26 mg/dL — AB (ref 6–20)
CALCIUM: 5.8 mg/dL — AB (ref 8.9–10.3)
CALCIUM: 6.5 mg/dL — AB (ref 8.9–10.3)
CO2: 15 mmol/L — AB (ref 22–32)
CO2: 13 mmol/L — AB (ref 22–32)
CREATININE: 4.41 mg/dL — AB (ref 0.44–1.00)
CREATININE: 4.46 mg/dL — AB (ref 0.44–1.00)
Chloride: 114 mmol/L — ABNORMAL HIGH (ref 101–111)
Chloride: 112 mmol/L — ABNORMAL HIGH (ref 101–111)
GFR calc Af Amer: 11 mL/min — ABNORMAL LOW (ref >60)
GFR calc Af Amer: 11 mL/min — ABNORMAL LOW (ref >60)
GFR calc non Af Amer: 10 mL/min — ABNORMAL LOW (ref >60)
GFR calc non Af Amer: 9 mL/min — ABNORMAL LOW (ref >60)
GLUCOSE: 160 mg/dL — AB (ref 65–99)
GLUCOSE: 143 mg/dL — AB (ref 65–99)
Potassium: 3 mmol/L — ABNORMAL LOW (ref 3.5–5.1)
Potassium: 2.9 mmol/L — ABNORMAL LOW (ref 3.5–5.1)
Sodium: 142 mmol/L (ref 135–145)
Sodium: 142 mmol/L (ref 135–145)

## 2014-12-06 LAB — CBC WITH DIFFERENTIAL/PLATELET
BASOS ABS: 0 10*3/uL (ref 0.0–0.1)
BASOS PCT: 0 %
EOS ABS: 0 10*3/uL (ref 0.0–0.7)
Eosinophils Relative: 0 %
HEMATOCRIT: 35.2 % — AB (ref 36.0–46.0)
HEMOGLOBIN: 11.5 g/dL — AB (ref 12.0–15.0)
Lymphocytes Relative: 6 %
Lymphs Abs: 1.2 10*3/uL (ref 0.7–4.0)
MCH: 26.1 pg (ref 26.0–34.0)
MCHC: 32.7 g/dL (ref 30.0–36.0)
MCV: 80 fL (ref 78.0–100.0)
MONO ABS: 1.4 10*3/uL — AB (ref 0.1–1.0)
Monocytes Relative: 7 %
NEUTROS ABS: 16.2 10*3/uL — AB (ref 1.7–7.7)
NEUTROS PCT: 86 %
Platelets: 75 10*3/uL — ABNORMAL LOW (ref 150–400)
RBC: 4.4 MIL/uL (ref 3.87–5.11)
RDW: 15.8 % — AB (ref 11.5–15.5)
WBC: 18.8 10*3/uL — ABNORMAL HIGH (ref 4.0–10.5)

## 2014-12-06 LAB — COMPREHENSIVE METABOLIC PANEL
ALK PHOS: 693 U/L — AB (ref 38–126)
ALT: 1580 U/L — AB (ref 14–54)
ANION GAP: 21 — AB (ref 5–15)
AST: 6723 U/L — ABNORMAL HIGH (ref 15–41)
Albumin: 1.8 g/dL — ABNORMAL LOW (ref 3.5–5.0)
BILIRUBIN TOTAL: 3.4 mg/dL — AB (ref 0.3–1.2)
BUN: 24 mg/dL — ABNORMAL HIGH (ref 6–20)
CALCIUM: 6.6 mg/dL — AB (ref 8.9–10.3)
CO2: 12 mmol/L — ABNORMAL LOW (ref 22–32)
CREATININE: 4.56 mg/dL — AB (ref 0.44–1.00)
Chloride: 110 mmol/L (ref 101–111)
GFR, EST AFRICAN AMERICAN: 11 mL/min — AB (ref >60)
GFR, EST NON AFRICAN AMERICAN: 9 mL/min — AB (ref >60)
Glucose, Bld: 179 mg/dL — ABNORMAL HIGH (ref 65–99)
Potassium: 3.5 mmol/L (ref 3.5–5.1)
Sodium: 143 mmol/L (ref 135–145)
TOTAL PROTEIN: 5.4 g/dL — AB (ref 6.5–8.1)

## 2014-12-06 LAB — CBC
HEMATOCRIT: 33.4 % — AB (ref 36.0–46.0)
HEMOGLOBIN: 10.7 g/dL — AB (ref 12.0–15.0)
MCH: 25.9 pg — AB (ref 26.0–34.0)
MCHC: 32 g/dL (ref 30.0–36.0)
MCV: 80.9 fL (ref 78.0–100.0)
Platelets: 70 10*3/uL — ABNORMAL LOW (ref 150–400)
RBC: 4.13 MIL/uL (ref 3.87–5.11)
RDW: 16.1 % — ABNORMAL HIGH (ref 11.5–15.5)
WBC: 17.2 10*3/uL — ABNORMAL HIGH (ref 4.0–10.5)

## 2014-12-06 LAB — BLOOD GAS, ARTERIAL
Acid-base deficit: 13.3 mmol/L — ABNORMAL HIGH (ref 0.0–2.0)
Bicarbonate: 11.1 mEq/L — ABNORMAL LOW (ref 20.0–24.0)
DRAWN BY: 39898
FIO2: 0.4
MECHVT: 660 mL
O2 SAT: 98.5 %
PATIENT TEMPERATURE: 98.6
PCO2 ART: 19.6 mmHg — AB (ref 35.0–45.0)
PEEP: 5 cmH2O
PH ART: 7.371 (ref 7.350–7.450)
PO2 ART: 168 mmHg — AB (ref 80.0–100.0)
RATE: 22 resp/min
TCO2: 11.7 mmol/L (ref 0–100)

## 2014-12-06 LAB — GLUCOSE, CAPILLARY
GLUCOSE-CAPILLARY: 122 mg/dL — AB (ref 65–99)
GLUCOSE-CAPILLARY: 137 mg/dL — AB (ref 65–99)
GLUCOSE-CAPILLARY: 153 mg/dL — AB (ref 65–99)
GLUCOSE-CAPILLARY: 156 mg/dL — AB (ref 65–99)
GLUCOSE-CAPILLARY: 160 mg/dL — AB (ref 65–99)
Glucose-Capillary: 138 mg/dL — ABNORMAL HIGH (ref 65–99)
Glucose-Capillary: 158 mg/dL — ABNORMAL HIGH (ref 65–99)

## 2014-12-06 LAB — FIBRINOGEN: Fibrinogen: 401 mg/dL (ref 204–475)

## 2014-12-06 LAB — PROTIME-INR
INR: 2.83 — ABNORMAL HIGH (ref 0.00–1.49)
PROTHROMBIN TIME: 29.3 s — AB (ref 11.6–15.2)

## 2014-12-06 LAB — OCCULT BLOOD X 1 CARD TO LAB, STOOL: Fecal Occult Bld: POSITIVE — AB

## 2014-12-06 LAB — MAGNESIUM: MAGNESIUM: 2.1 mg/dL (ref 1.7–2.4)

## 2014-12-06 LAB — HEPARIN LEVEL (UNFRACTIONATED)

## 2014-12-06 MED ORDER — SODIUM CHLORIDE 0.9 % IV BOLUS (SEPSIS)
2000.0000 mL | Freq: Once | INTRAVENOUS | Status: AC
  Administered 2014-12-06: 2000 mL via INTRAVENOUS

## 2014-12-06 MED ORDER — POTASSIUM CHLORIDE 10 MEQ/50ML IV SOLN
10.0000 meq | INTRAVENOUS | Status: AC
  Administered 2014-12-06 – 2014-12-07 (×6): 10 meq via INTRAVENOUS
  Filled 2014-12-06 (×6): qty 50

## 2014-12-06 MED ORDER — SODIUM CHLORIDE 0.9 % IJ SOLN
10.0000 mL | Freq: Two times a day (BID) | INTRAMUSCULAR | Status: DC
  Administered 2014-12-06: 10 mL
  Administered 2014-12-07: 20 mL
  Administered 2014-12-08 – 2014-12-12 (×8): 10 mL
  Administered 2014-12-12 – 2014-12-13 (×2): 30 mL

## 2014-12-06 MED ORDER — POTASSIUM CHLORIDE 10 MEQ/100ML IV SOLN
10.0000 meq | INTRAVENOUS | Status: AC
  Administered 2014-12-06 (×3): 10 meq via INTRAVENOUS
  Filled 2014-12-06 (×3): qty 100

## 2014-12-06 MED ORDER — LOPERAMIDE HCL 1 MG/5ML PO LIQD
2.0000 mg | Freq: Four times a day (QID) | ORAL | Status: DC | PRN
  Filled 2014-12-06: qty 10

## 2014-12-06 MED ORDER — SODIUM CHLORIDE 0.9 % IV SOLN
2.0000 g | Freq: Once | INTRAVENOUS | Status: AC
  Administered 2014-12-06: 2 g via INTRAVENOUS
  Filled 2014-12-06: qty 20

## 2014-12-06 MED ORDER — SODIUM CHLORIDE 0.45 % IV SOLN
INTRAVENOUS | Status: DC
  Administered 2014-12-06 – 2014-12-07 (×2): via INTRAVENOUS

## 2014-12-06 MED ORDER — SODIUM CHLORIDE 0.9 % IJ SOLN
10.0000 mL | INTRAMUSCULAR | Status: DC | PRN
  Administered 2014-12-11: 10 mL
  Filled 2014-12-06: qty 40

## 2014-12-06 MED ORDER — SODIUM CHLORIDE 0.9 % IV BOLUS (SEPSIS)
1000.0000 mL | Freq: Once | INTRAVENOUS | Status: AC
  Administered 2014-12-06: 1000 mL via INTRAVENOUS

## 2014-12-06 MED ORDER — HEPARIN BOLUS VIA INFUSION
2500.0000 [IU] | Freq: Once | INTRAVENOUS | Status: AC
  Administered 2014-12-06: 2500 [IU] via INTRAVENOUS
  Filled 2014-12-06: qty 2500

## 2014-12-06 NOTE — Progress Notes (Signed)
[facility] Progress Note Patient Name: Yesenia Lyons DOB: [birth year] MRN: [id number]   Date of Service  12/06/2014  HPI/Events of Note  Multiple seizures: 1. Ca++ = 5.8 and Albumin = 1.8. Ca++ corrected for Albumin = 7.56 and 2. K+ = 3.0 and Creatinine - 4.41.  eICU Interventions  Will replete K+ and Ca++.     Intervention Category Intermediate Interventions: Electrolyte abnormality - evaluation and management  Sommer,Steven Eugene 12/06/2014, 3:20 PM

## 2014-12-06 NOTE — Progress Notes (Signed)
eLink Physician-Brief Progress Note Patient Name: Yesenia Lyons DOB: 06-04-1950 MRN: [id number]   Date of Service  12/06/2014  HPI/Events of Note  Hypokalemia in the setting of renal injury  eICU Interventions  Potassium replaced     Intervention Category Major Interventions: Electrolyte abnormality - evaluation and management  DETERDING,ELIZABETH 12/06/2014, 11:43 PM

## 2014-12-06 NOTE — Progress Notes (Signed)
CRITICAL VALUE ALERT  Critical value received:  Ca 5.8  Date of notification:  11/29  Time of notification:  14:30  Critical value read back:Yes.    Nurse who received alert:  San Jetty, RN  MD notified (1st page):  CCM  Time of first page:  14:44  MD notified (2nd page): Elink RN  Time of second page: 15:05  Responding MD:  Dr. Oletta Darter  Time MD responded:  15:20

## 2014-12-06 NOTE — Consult Note (Signed)
Referring Provider: No ref. provider found Primary Care Physician:  No primary care provider on file. Primary Gastroenterologist:  Althia Forts  Reason for Consultation:  Diarrhea  HPI: Yesenia Lyons is a 64 y.o. female who was admitted on November 27 after having a syncopal episode while on the toilet at home in cardiac arrest with PEA.  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 Ernestina Penna last note in October and by recent imaging.  Treatment with Everolimus started 08/2014 as second line treatment.  GI is being consult for diarrhea due to concerns that this is causing electrolyte abnormalities. Also has an elevated lactic acid, but not rechecked since 11/28.  CT san without contrast does not show any bowel necrosis but suggests some proximal colonic wall thickening.  She initially presented with extremely low potassium, but this has been repleted. It appears that this diarrhea has been present since her cholecystectomy, according to Dr. Ernestina Penna note in October.  Has 1.3 Liter stool output documented in past 24 hours.  Patient has Flexi-seal with clear liquid stool, almost transparent.  Was heme positive.  Is on Vanco, Flagyl, and ceftazidime.  Not on any anti-diarrheals.  Has elevated LFT's with AST 6723, ALT 1580, ALP 693, and total bili 3.4.  LFT's were normal in October with exception of elevated ALP, likely due to carcinoid.  Patient is intubated and on pressors.   Past Medical History  Diagnosis Date  . Diabetes mellitus without complication ([place])   . Hypertension   . Cancer [facility])     Liver cx, diagnosed in July 2013    Past Surgical History  Procedure Laterality Date  . Knee surgery    . Abdominal hysterectomy    . Cholecystectomy      Prior to Admission medications   Medication Sig Start Date End Date Taking? Authorizing Provider  albuterol (PROVENTIL HFA;VENTOLIN HFA) 108 (90 BASE) MCG/ACT inhaler Inhale 1 puff into the lungs every 6 (six)  hours as needed for wheezing or shortness of breath.   Yes Historical Provider, MD  amLODipine-benazepril (LOTREL) 5-10 MG capsule Take 1 capsule by mouth daily.   Yes Historical Provider, MD  beclomethasone (QVAR) 80 MCG/ACT inhaler Inhale 1 puff into the lungs daily.   Yes Historical Provider, MD  everolimus (AFINITOR) 10 MG tablet Take 10 mg by mouth daily.   Yes Historical Provider, MD  feeding supplement (ENSURE IMMUNE HEALTH) LIQD Take 237 mLs by mouth 3 (three) times daily with meals.   Yes Historical Provider, MD  ferrous gluconate (FERGON) 324 MG tablet Take 324 mg by mouth 2 (two) times daily with a meal.   Yes Historical Provider, MD  furosemide (LASIX) 40 MG tablet Take 40 mg by mouth daily.   Yes Historical Provider, MD  gabapentin (NEURONTIN) 400 MG capsule Take 400 mg by mouth 3 (three) times daily.   Yes Historical Provider, MD  insulin glargine (LANTUS) 100 UNIT/ML injection Inject 10 Units into the skin 2 (two) times daily.   Yes Historical Provider, MD  methocarbamol (ROBAXIN) 500 MG tablet Take 1,000 mg by mouth every 8 (eight) hours as needed for muscle spasms.   Yes Historical Provider, MD  mirtazapine (REMERON) 15 MG tablet Take 15 mg by mouth at bedtime.   Yes Historical Provider, MD  ondansetron (ZOFRAN-ODT) 4 MG disintegrating tablet Take 4 mg by mouth every 8 (eight) hours as needed for nausea or vomiting.   Yes Historical Provider, MD  oxyCODONE-acetaminophen (PERCOCET) 10-325 MG tablet Take  1 tablet by mouth every 8 (eight) hours as needed for pain.   Yes Historical Provider, MD  potassium chloride SA (K-DUR,KLOR-CON) 20 MEQ tablet Take 20 mEq by mouth daily.   Yes Historical Provider, MD  saccharomyces boulardii (FLORASTOR) 250 MG capsule Take 250 mg by mouth 2 (two) times daily.   Yes Historical Provider, MD  spironolactone (ALDACTONE) 25 MG tablet Take 25 mg by mouth daily.   Yes Historical Provider, MD  vitamin C (ASCORBIC ACID) 500 MG tablet Take 500 mg by mouth  daily.   Yes Historical Provider, MD    Current Facility-Administered Medications  Medication Dose Route Frequency Provider Last Rate Last Dose  . 0.9 %  sodium chloride infusion  250 mL Intravenous PRN Raylene Miyamoto, MD   Stopped at 12/05/14 1441  . 0.9 %  sodium chloride infusion   Intravenous Continuous Raylene Miyamoto, MD 125 mL/hr at 12/05/14 1853    . antiseptic oral rinse solution (CORINZ)  7 mL Mouth Rinse 10 times per day Raylene Miyamoto, MD   7 mL at 12/06/14 0600  . cefTAZidime (FORTAZ) 2 g in dextrose 5 % 50 mL IVPB  2 g Intravenous Q24H Lauren D Bajbus, RPH 100 mL/hr at 12/05/14 1441 2 g at 12/05/14 1441  . chlorhexidine gluconate (PERIDEX) 0.12 % solution 15 mL  15 mL Mouth Rinse BID Raylene Miyamoto, MD   15 mL at 12/06/14 0845  . fentaNYL (SUBLIMAZE) injection 100 mcg  100 mcg Intravenous Q2H PRN Raylene Miyamoto, MD   50 mcg at 12/05/14 1851  . insulin aspart (novoLOG) injection 0-9 Units  0-9 Units Subcutaneous 6 times per day Raylene Miyamoto, MD   2 Units at 12/06/14 0910  . lactulose (CHRONULAC) 10 GM/15ML solution 30 g  30 g Per Tube TID Anders Simmonds, MD   30 g at 12/05/14 2248  . metroNIDAZOLE (FLAGYL) IVPB 500 mg  500 mg Intravenous Q8H Raylene Miyamoto, MD   500 mg at 12/06/14 0206  . norepinephrine (LEVOPHED) 16 mg in dextrose 5 % 250 mL (0.064 mg/mL) infusion  0-40 mcg/min Intravenous Titrated Raylene Miyamoto, MD 15 mL/hr at 12/06/14 0900 16 mcg/min at 12/06/14 0900  . ondansetron (ZOFRAN) injection 4 mg  4 mg Intravenous Q6H PRN Raylene Miyamoto, MD      . pantoprazole (PROTONIX) injection 40 mg  40 mg Intravenous QHS Raylene Miyamoto, MD   40 mg at 12/05/14 2248  . sodium chloride 0.9 % bolus 1,000 mL  1,000 mL Intravenous Once Raylene Miyamoto, MD   1,000 mL at 12/06/14 0959  . vancomycin (VANCOCIN) 1,750 mg in sodium chloride 0.9 % 500 mL IVPB  1,750 mg Intravenous Q48H Lauren D Bajbus, RPH      . vasopressin (PITRESSIN) 40 Units in  sodium chloride 0.9 % 250 mL (0.16 Units/mL) infusion  0.03 Units/min Intravenous Continuous Raylene Miyamoto, MD 11.3 mL/hr at 12/06/14 0700 0.03 Units/min at 12/06/14 0700    Allergies as of 11/30/2014 - Review Complete 11/29/2014  Allergen Reaction Noted  . Penicillins Itching and Swelling 11/19/2014    History reviewed. No pertinent family history.  Social History   Social History  . Marital Status: Single    Spouse Name: N/A  . Number of Children: N/A  . Years of Education: N/A   Occupational History  . Not on file.   Social History Main Topics  . Smoking status: Never Smoker   . Smokeless tobacco:  Not on file  . Alcohol Use: Not on file  . Drug Use: Not on file  . Sexual Activity: Not on file   Other Topics Concern  . Not on file   Social History Narrative  . No narrative on file    Review of Systems: Ten point ROS is O/W negative except as mentioned in HPI.  Physical Exam: Vital signs in last 24 hours: Temp:  [97.5 F (36.4 C)-98.1 F (36.7 C)] 97.6 F (36.4 C) (11/29 0800) Pulse Rate:  [53-100] 68 (11/29 0726) Resp:  [17-31] 19 (11/29 0800) BP: (85)/(75) 85/75 mmHg (11/28 1800) SpO2:  [83 %-100 %] 98 % (11/29 0800) Arterial Line BP: (55-111)/(40-64) 79/48 mmHg (11/29 0800) FiO2 (%):  [30 %-40 %] 30 % (11/29 0800) Weight:  [293 lb 6.9 oz (133.1 kg)] 293 lb 6.9 oz (133.1 kg) (11/29 0400) Last BM Date: 12/05/14 General:  Intubated but opens eyes and acknowledges when addressed. Head:  Normocephalic and atraumatic. Eyes:  Slight scleral icterus. Ears:  Normal auditory acuity. Mouth:  No deformity or lesions.   Lungs:  Clear throughout to auscultation.  No wheezes, crackles, or rhonchi.  Heart:  Regular rate and rhythm; no murmurs, clicks, rubs, or gallops. Abdomen:  Soft, obese, non-distended.  BS present.  Winces with palpation diffusely. Rectal:  Deferred.  Hemoccult positive.  Liquid stool in flexi-seal bad that is almost clear/tranparent.  Msk:   Symmetrical without gross deformities. Pulses:  Normal pulses noted. Extremities:  Without clubbing or edema.  Intake/Output from previous day: 11/28 0701 - 11/29 0700 In: 4691 [I.V.:2191; NG/GT:1030; IV Piggyback:1470] Out: 1450 [Emesis/NG output:150; Stool:1300] Intake/Output this shift: Total I/O In: 29.5 [I.V.:29.5] Out: 0   Lab Results:  Recent Labs  11/22/2014 1437 12/05/14 0600 12/06/14 0400  WBC 22.3* 26.7* 18.8*  HGB 13.1 12.6 11.5*  HCT 43.0 39.2 35.2*  PLT 197 157 75*   BMET  Recent Labs  12/05/14 1500 12/05/14 2250 12/06/14 0400  NA 143 139 143  K 2.8* 3.1* 3.5  CL 110 107 110  CO2 12* 11* 12*  GLUCOSE 187* 172* 179*  BUN 23* 24* 24*  CREATININE 4.13* 4.40* 4.56*  CALCIUM 6.4* 6.6* 6.6*   LFT  Recent Labs  12/06/14 0400  PROT 5.4*  ALBUMIN 1.8*  AST 6723*  ALT 1580*  ALKPHOS 693*  BILITOT 3.4*   PT/INR  Recent Labs  11/10/2014 1437 12/06/14 0400  LABPROT 22.3* 29.3*  INR 1.97* 2.83*   Studies/Results: Ct Abdomen Pelvis Wo Contrast  12/05/2014  CLINICAL DATA:  Shock. Assess for dead bowel. History of liver carcinoid EXAM: CT ABDOMEN AND PELVIS WITHOUT CONTRAST TECHNIQUE: Multidetector CT imaging of the abdomen and pelvis was performed following the standard protocol without IV contrast. COMPARISON:  None. FINDINGS: Lower chest and abdominal wall: Cardiomegaly. Diffuse coronary atherosclerotic calcification. Mild basilar atelectasis with trace left effusion. Hepatobiliary: Enlarged liver with marked hypo density with superimposed numerous and confluent nodules with heterogeneous size and margins. The nodules correlate with liver metastases history, superimposed on a steatotic liver. Enlarged lymph nodes in the deep liver drainage measuring up to 18 mm short axis superior to the pancreas neck. Cholecystectomy. Pancreas: Diffuse atrophy without ductal enlargement. Mildly nodular appearance around the pancreatic body and tail of indeterminate  significance Spleen: Unremarkable. Adrenals/Urinary Tract: 6 cm mass at the left adrenal gland measuring 40 Hounsfield units. This could reflect a solid mass or hematoma, smooth margins favoring the former. The right adrenal is negative. No hydronephrosis or stone. Decompressed  bladder by Foley catheter Reproductive:Hysterectomy with negative adnexa. Stomach/Bowel: No indication of bowel necrosis, including pneumatosis or perforation. The transverse colon and hepatic flexure has a thick walled appearance circumferentially. No bowel obstruction or adynamic ileus. Nasogastric tube tip is near the pylorus. Vascular/Lymphatic: Right femoral arterial catheter with associated soft tissue edema and gas. Extensive adenopathy in the small bowel mesentery where there is a 64 x 33 mm mass with coarse central calcification, correlating with carcinoid history. Adenopathy extends to the mesenteric root and into the upper abdominal retroperitoneal ligaments. Peritoneal: Small non loculated ascites in the pelvis, nonspecific. Musculoskeletal: No acute or aggressive process. Lower lumbar facet arthropathy. IMPRESSION: 1. No evidence of bowel necrosis. 2. Proximal colonic wall thickening which could be ischemic or infectious. 3. 6 cm left adrenal mass, favor metastasis over hematoma. 4. History of carcinoid with extensive intra-abdominal adenopathy and hepatic metastatic disease. Electronically Signed   By: Monte Fantasia M.D.   On: 12/05/2014 13:16   Dg Chest Port 1 View  12/06/2014  CLINICAL DATA:  Status post cardiac arrest, shock, acute and chronic renal failure, liver malignancy. EXAM: PORTABLE CHEST 1 VIEW COMPARISON:  Portable chest x-ray of August 04, 2014 FINDINGS: The lung volumes remain low. The retrocardiac region on the left remains dense. There is no large pleural effusion or evidence of a pneumothorax. The cardiac silhouette is enlarged. The central pulmonary vascularity is engorged. The endotracheal tube lies  approximately 2.7 cm above the carina. The esophagogastric tube tip and proximal port project below the inferior margin of the image. IMPRESSION: Hypoinflation with persistent left lower lobe atelectasis or infiltrate. Stable enlargement of cardiac silhouette without significant pulmonary vascular congestion. Electronically Signed   By: David  Martinique M.D.   On: 12/06/2014 07:33   Dg Chest Port 1 View  12/05/2014  CLINICAL DATA:  Cardiopulmonary arrest. EXAM: PORTABLE CHEST - 1 VIEW COMPARISON:  One-view chest x-ray 11/28/2014 FINDINGS: Cardiac enlargement is stable. Pulmonary congestion is improved. The the lung volumes remain low. Minimal bibasilar airspace disease likely reflects atelectasis. The endotracheal tube, left IJ line, and NG tube are stable. IMPRESSION: 1. Stable cardiomegaly and low lung volumes. 2. Improved aeration of both lungs with mild bibasilar atelectasis remaining. 3. The support apparatus is stable. Electronically Signed   By: San Morelle M.D.   On: 12/05/2014 06:54   Dg Chest Portable 1 View  11/10/2014  CLINICAL DATA:  Central line placement.  Post CPR. EXAM: PORTABLE CHEST 1 VIEW COMPARISON:  11/29/2014 FINDINGS: Interval placement of a LEFT central venous line with tip in the distal SVC. Endotracheal tube and NG tube unchanged. The low lung volumes similar prior. Enlarged cardiac silhouette. No overt pulmonary edema. Mild central venous congestion. IMPRESSION: 1. Interval placement of LEFT central venous line without pneumothorax. 2. Intubated patient with low lung volumes and central venous congestion. Electronically Signed   By: Suzy Bouchard M.D.   On: 11/24/2014 13:55   Dg Chest Portable 1 View  11/19/2014  CLINICAL DATA:  Found unresponsive on toilet. EXAM: PORTABLE CHEST 1 VIEW COMPARISON:  None. FINDINGS: Patient slightly rotated to the left. Endotracheal tube with tip 3.2 cm above the carina. Nasogastric tube courses through the region of the stomach as tip  is not visualized. Lungs are hypoinflated with no definite consolidation or effusion. Mild prominence of the perihilar markings likely due to the degree of hypoinflation. Borderline cardiomegaly. Mild calcified plaque over the thoracic aorta. There are degenerative changes of the spine. IMPRESSION: Hypoinflation without acute cardiopulmonary disease.  Borderline cardiomegaly. Tubes and lines as described. Electronically Signed   By: Marin Olp M.D.   On: 11/20/2014 12:40    IMPRESSION:  -Diarrhea:  Appears to be present since lap chole in 07/2014 per Dr. Ernestina Penna note in September.  CT scan without contrast, but does not suggest dead bowel, but may have some ischemic colitis due to hypotension during code.  Lactic acid has been elevated, but not checked today; metabolic acidosis with anion gap.  Cdiff negative.  ? If diarrhea is side effect of everolimus, which was started in August.  Could also be due to the carcinoid itself. -Significantly elevated LFT's:  Likely shock liver due to hypotension. -Progressive metastatic carcinoid with mets to liver -Coagulopathy with INR 2.83 today -PEA arrest at home:  Unclear etiology per cards. -AKI on CKD:  Baseline Cr 1.2.  Cr continues to increase to 4.56 today and is anuric. -Shock:  On pressors and intubated.  PLAN: -Stool studies are in process including ova and parasites, stool culture, and stool osmolality.  We will follow-up those results. -Does not seem like an appropriate candidate for colonoscopy at this time.  CT scan is usually quite sensitive for dead bowel. -Could try questran, but will likely clog OG tube.  Will start liquid Imodium 2 mg every 6 hours prn.  Could also consider octreotide if needed with carcinoid. -Trend LFT's.  Supportive care O/W for shock liver.  ZEHR, JESSICA D.  12/06/2014, 10:04 AM  Pager number [phone]      Attending physician's note   I have taken a history, examined the patient and reviewed the chart. I agree  with the Advanced Practitioner's note, impression and recommendations.  Chronic diarrhea likely post cholecystectomy or carcinoid related. Awaiting remaining stool studies. Add Imodium for now. Following cardiac arrest she has suffered an acute ischemic event to liver, kidneys, bowel. No evidence for bowel necrosis on CT however has right colon thickening on CT c/w ischemic colitis. Rising INR and critical condition would make colonoscopy at a higher risk of complications. Supportive mgmt for now. Follow CBC, CMP, INR. Dr. Carlean Purl will see tomorrow.    Lucio Edward, MD [facility] [phone] Mon-Fri 8a-5p [phone] after 5p, weekends, holidays

## 2014-12-06 NOTE — Progress Notes (Signed)
ANTICOAGULATION CONSULT NOTE - Initial Consult  Pharmacy Consult for Heparin Indication: R/o PE, Elevated D-Dimer  Allergies  Allergen Reactions  . Penicillins Itching and Swelling    Patient Measurements: Height: 6\' 4"  (193 cm) Weight: 289 lb 14.5 oz (131.5 kg) IBW/kg (Calculated) : 82.3 Heparin Dosing Weight: 111 kg  Vital Signs: Temp: 98.1 F (36.7 C) (11/28 1315) Temp Source: Axillary (11/28 1315) BP: 107/57 mmHg (11/28 0319) Pulse Rate: 45 (11/28 0529)  Labs:  Recent Labs  11/14/2014 1150 11/08/2014 1437 11/08/2014 1900 12/05/14 0155 12/05/14 0600  HGB 11.8* 13.1  --   --  12.6  HCT 38.5 43.0  --   --  39.2  PLT 191 197  --   --  157  APTT  --  52*  --   --   --   LABPROT  --  22.3*  --   --   --   INR  --  1.97*  --   --   --   CREATININE 3.87* 3.39* 3.70* 3.71* 3.85*  TROPONINI  --  1.07* 1.49* 1.95*  --     Estimated Creatinine Clearance: 23.5 mL/min (by C-G formula based on Cr of 3.85).   Medical History: Past Medical History  Diagnosis Date  . Diabetes mellitus without complication ([place])   . Hypertension   . Cancer [facility])     Liver cx, diagnosed in July 2013    Medications:  Scheduled:  . [START ON 12/06/2014] anidulafungin  100 mg Intravenous Q24H  . antiseptic oral rinse  7 mL Mouth Rinse 10 times per day  . cefTAZidime (FORTAZ)  IV  2 g Intravenous Q24H  . chlorhexidine gluconate  15 mL Mouth Rinse BID  . insulin aspart  0-9 Units Subcutaneous 6 times per day  . lactulose  30 g Per Tube TID  . metronidazole  500 mg Intravenous Q8H  . pantoprazole (PROTONIX) IV  40 mg Intravenous QHS  . [START ON 12/06/2014] vancomycin  1,750 mg Intravenous Q48H    Assessment: 64 year old female with elevated D-Dimer (> 20) and elevated INR of 1.97 s/p PEA arrest. She is on heparin for r/o DVT - doppler completed but DVT could not be excluded. INR is 2.83; discusssed with Dr. Titus Mould and will hold of on heparin for now and re-start if INR < 2.5  Goal of  Therapy:  Heparin level 0.3-0.7 units/ml Monitor platelets by anticoagulation protocol: Yes   Plan:  - Re-start heparin if INR < 2.5 -Daily PT/INR   Hildred Laser, Pharm D 12/06/2014 1:36 PM

## 2014-12-06 NOTE — H&P (Addendum)
PULMONARY / CRITICAL CARE MEDICINE   Name: Shanai Vessell MRN: [id number] DOB: 1950-02-20    ADMISSION DATE:  11/20/2014 CONSULTATION DATE: 11/27  REFERRING MD :  EDP, DR Schlossman  CHIEF COMPLAINT:  arrest  HISTORY OF PRESENT ILLNESS:   64 yr old female, h/o carcinoid tumor liver, chronic diarhea, poor intake, coded pea home on toilet 6 min , K noted low  SUBJECTIVE: levo down, low output, stools increased  VITAL SIGNS: BP 85/75 mmHg  Pulse 68  Temp(Src) 97.5 F (36.4 C) (Axillary)  Resp 21  Ht 6\' 4"  (1.93 m)  Wt 133.1 kg (293 lb 6.9 oz)  BMI 35.73 kg/m2  SpO2 98%  HEMODYNAMICS: CVP:  [3 mmHg-11 mmHg] 3 mmHg  VENTILATOR SETTINGS: Vent Mode:  [-] PSV;CPAP FiO2 (%):  [30 %-40 %] 30 % Set Rate:  [22 bmp] 22 bmp Vt Set:  [id number] mL] 660 mL PEEP:  [5 cmH20] 5 cmH20 Pressure Support:  [8 cmH20] 8 cmH20 Plateau Pressure:  [25 cmH20] 25 cmH20  INTAKE / OUTPUT: I/O last 3 completed shifts: In: 7806.5 [I.V.:3721.5; NG/GT:1105; IV [id number] Out: [id number] [Urine:20; Emesis/NG output:400; [id number]  PHYSICAL EXAMINATION: General: rass 0 Neuro:  Moving upper ext, rass 0, no simple commands HEENT:  Obese, line clean Cardiovascular:  s1 s2 RRR distant Lungs:  cta Abdomen:  Old scars, obese, Tenderness mild , ND., ro r/g Musculoskeletal:  Increase edema Skin:  dry  LABS:  CBC  Recent Labs Lab 12/07/2014 1437 12/05/14 0600 12/06/14 0400  WBC 22.3* 26.7* 18.8*  HGB 13.1 12.6 11.5*  HCT 43.0 39.2 35.2*  PLT 197 157 75*   Coag's  Recent Labs Lab 11/28/2014 1437 12/06/14 0400  APTT 52*  --   INR 1.97* 2.83*   BMET  Recent Labs Lab 12/05/14 1500 12/05/14 2250 12/06/14 0400  NA 143 139 143  K 2.8* 3.1* 3.5  CL 110 107 110  CO2 12* 11* 12*  BUN 23* 24* 24*  CREATININE 4.13* 4.40* 4.56*  GLUCOSE 187* 172* 179*   Electrolytes  Recent Labs Lab 11/28/2014 1437  12/05/14 0600 12/05/14 1500 12/05/14 2250 12/06/14 0400  CALCIUM 6.5*  < > 6.6* 6.4* 6.6*  6.6*  MG 1.7  --  1.7  --   --  2.1  PHOS 6.4*  --  6.0*  --   --   --   < > = values in this interval not displayed. Sepsis Markers  Recent Labs Lab 11/26/2014 1437 11/28/2014 1453 12/02/2014 2305 12/05/14 0155  LATICACIDVEN 9.8* 11.73* 10.9* 9.3*  PROCALCITON 3.91  --   --   --    ABG  Recent Labs Lab 11/08/2014 2314 12/05/14 0616 12/06/14 0320  PHART 7.316* 7.420 7.371  PCO2ART 18.2* 17.1* 19.6*  PO2ART 144.0* 135.0* 168*   Liver Enzymes  Recent Labs Lab 11/25/2014 1150 11/25/2014 1437 12/06/14 0400  AST 666* 1173* 6723*  ALT 156* 366* 1580*  ALKPHOS 360* 384* 693*  BILITOT 2.1* 2.4* 3.4*  ALBUMIN 2.1* 1.9* 1.8*   Cardiac Enzymes  Recent Labs Lab 11/30/2014 1437 11/26/2014 1900 12/05/14 0155  TROPONINI 1.07* 1.49* 1.95*   Glucose  Recent Labs Lab 12/05/14 0827 12/05/14 1118 12/05/14 1508 12/05/14 2024 12/06/14 0008 12/06/14 0400  GLUCAP 215* 173* 161* 151* 158* 160*    Imaging Ct Abdomen Pelvis Wo Contrast  12/05/2014  CLINICAL DATA:  Shock. Assess for dead bowel. History of liver carcinoid EXAM: CT ABDOMEN AND PELVIS WITHOUT CONTRAST TECHNIQUE: Multidetector CT imaging of the abdomen and pelvis  was performed following the standard protocol without IV contrast. COMPARISON:  None. FINDINGS: Lower chest and abdominal wall: Cardiomegaly. Diffuse coronary atherosclerotic calcification. Mild basilar atelectasis with trace left effusion. Hepatobiliary: Enlarged liver with marked hypo density with superimposed numerous and confluent nodules with heterogeneous size and margins. The nodules correlate with liver metastases history, superimposed on a steatotic liver. Enlarged lymph nodes in the deep liver drainage measuring up to 18 mm short axis superior to the pancreas neck. Cholecystectomy. Pancreas: Diffuse atrophy without ductal enlargement. Mildly nodular appearance around the pancreatic body and tail of indeterminate significance Spleen: Unremarkable. Adrenals/Urinary  Tract: 6 cm mass at the left adrenal gland measuring 40 Hounsfield units. This could reflect a solid mass or hematoma, smooth margins favoring the former. The right adrenal is negative. No hydronephrosis or stone. Decompressed bladder by Foley catheter Reproductive:Hysterectomy with negative adnexa. Stomach/Bowel: No indication of bowel necrosis, including pneumatosis or perforation. The transverse colon and hepatic flexure has a thick walled appearance circumferentially. No bowel obstruction or adynamic ileus. Nasogastric tube tip is near the pylorus. Vascular/Lymphatic: Right femoral arterial catheter with associated soft tissue edema and gas. Extensive adenopathy in the small bowel mesentery where there is a 64 x 33 mm mass with coarse central calcification, correlating with carcinoid history. Adenopathy extends to the mesenteric root and into the upper abdominal retroperitoneal ligaments. Peritoneal: Small non loculated ascites in the pelvis, nonspecific. Musculoskeletal: No acute or aggressive process. Lower lumbar facet arthropathy. IMPRESSION: 1. No evidence of bowel necrosis. 2. Proximal colonic wall thickening which could be ischemic or infectious. 3. 6 cm left adrenal mass, favor metastasis over hematoma. 4. History of carcinoid with extensive intra-abdominal adenopathy and hepatic metastatic disease. Electronically Signed   By: Monte Fantasia M.D.   On: 12/05/2014 13:16   Dg Chest Port 1 View  12/06/2014  CLINICAL DATA:  Status post cardiac arrest, shock, acute and chronic renal failure, liver malignancy. EXAM: PORTABLE CHEST 1 VIEW COMPARISON:  Portable chest x-ray of August 04, 2014 FINDINGS: The lung volumes remain low. The retrocardiac region on the left remains dense. There is no large pleural effusion or evidence of a pneumothorax. The cardiac silhouette is enlarged. The central pulmonary vascularity is engorged. The endotracheal tube lies approximately 2.7 cm above the carina. The  esophagogastric tube tip and proximal port project below the inferior margin of the image. IMPRESSION: Hypoinflation with persistent left lower lobe atelectasis or infiltrate. Stable enlargement of cardiac silhouette without significant pulmonary vascular congestion. Electronically Signed   By: David  Martinique M.D.   On: 12/06/2014 07:33     STUDIES:  Echo 11/27>>>rv hypokinetic, mod dilated, pa 40, LV 50%-55% CT 11/27>>>No evidence of bowel necrosis. 2. Proximal colonic wall thickening which could be ischemic or infectious.6 cm left adrenal mass, favor metastasis over hematoma. History of carcinoid with extensive intra-abdominal adenopathy and hepatic metastatic disease.  CULTURES: BC 11/27>>> cdiff neg 11/27 O and P 11/28>>> Stool culture / enteric pathogens 11/28>>>  ANTIBIOTICS: ceftaz 11/27>>> vanc 11/27>>> Flagyll 11/27>>>  SIGNIFICANT EVENTS: 11/27- arrest 56 min , pea , unwitnessed , low K , electrolytes issue 11/28- levo down, urine output low  LINES/TUBES: 11/27 left IJ>>> 11/27 ett>>> 11/27 rt fem a line>>>  ASSESSMENT / PLAN:  PULMONARY A: Acute resp failure Left lower lobe ? mass P:  ABG reviewed, requires higher MV, keep rate 22 SBt attempt ok cpap 5 ps 5 , goal 2 hrs Would prefer improved pressors further prior to extubation pcxr in am   CARDIOVASCULAR  A:  S/p arrest, r/o secondary to hypokalemia, vaso vagal and hypovolemia RV on echo (if PE) does not explain shock state) P:  Levophed to map 60, vasopressin cvp down again from diarrhea, consider further aggressive bolus Keep ph greater 7.20 cvp goal 8 if able  RENAL A:   ARF, unknown baseline, lasix diuretic dependent, lactic acidosis post arrest, huge AG remains, r;o dead bowel proximal colon P:  , hypokalemia Chem q8h Will call renal to consider cvvhd now given NO output 24 h if not responding to more volume Ct without hydro  GASTROINTESTINAL A:   LIver cancer carcinoid Extesnive, r/o dead  bowel, shock liver worsening, chronic diahrea from carcinoid, Abdo soft on examination P:   LFT in am CT reviewed, not overwhelming Npo ppi Will define with daughter aggressive measures- surgery etc if warranted Will cal GI , colon? To assess prox colon?  Slow stools?  HEMATOLOGIC A:   DVT prevention, Doubt PE clinically significant, coagulapthy on own ( liver carcinoid), thrombocytopenia ( MODS, liver cancer), r/o dioc  P:  Doppler neg, rv does not explain shock state scd for now Fibrinogen, factor 8 Would favor use of heparin over auto anticoagulation at levels of INR less 2.5, lower hep level goals   INFECTIOUS A:  Immunocompromised host, r/o dead bowel, r;io infectious diarrhea (unloikley)  P:   11/27 vanc>>> 11/27 ceftaz>>> 11/27 flagyl>>> 11/27 anidulo>>>11/28  Stool work up  ENDOCRINE A:   No evidence AI P:   ssi  NEUROLOGIC A:   S/p arrest, r/o anoxia (woke up post code), high risk hepatic encpeh P:   RASS goal: -1 fent min used wua Avoid benzo with liver Until stool output improves, will hold lactulose NO AMMONIA LEVELS INDICATED   FAMILY  - Updates: daughter 11/27, 28, 29  - Inter-disciplinary family meet or Palliative Care meeting due by:  12/1  Ccm time 40 min   Family to be updated Lavon Paganini. Titus Mould, MD, [place] Pgr: [facility]

## 2014-12-06 NOTE — Progress Notes (Signed)
Subjective:  Intubated. Awake but does not follow commands.  Objective:  Vital Signs in the last 24 hours: Temp:  [97.5 F (36.4 C)-98.3 F (36.8 C)] 97.5 F (36.4 C) (11/29 0400) Pulse Rate:  [53-100] 68 (11/29 0301) Resp:  [9-31] 22 (11/29 0500) BP: (85)/(75) 85/75 mmHg (11/28 1800) SpO2:  [83 %-100 %] 100 % (11/29 0400) Arterial Line BP: (55-111)/(40-64) 88/51 mmHg (11/29 0500) FiO2 (%):  [40 %] 40 % (11/29 0301) Weight:  [293 lb 6.9 oz (133.1 kg)] 293 lb 6.9 oz (133.1 kg) (11/29 0400)  Intake/Output from previous day: 11/28 0701 - 11/29 0700 In: 4644.6 [I.V.:2144.6; NG/GT:1030; IV Piggyback:1470] Out: 1450 [Emesis/NG output:150; Stool:1300]  Physical Exam: NAD HEENT: intubated Neck: supple Lungs: CTA bilaterally CV: RRR without murmur or gallop Abd: soft, NT, Positive BS, no hepatomegaly Ext: no C/C/E, distal pulses intact and equal Skin: warm/dry no rash  Lab Results:  Recent Labs  12/05/14 0600 12/06/14 0400  WBC 26.7* 18.8*  HGB 12.6 11.5*  PLT 157 75*    Recent Labs  12/05/14 2250 12/06/14 0400  NA 139 143  K 3.1* 3.5  CL 107 110  CO2 11* 12*  GLUCOSE 172* 179*  BUN 24* 24*  CREATININE 4.40* 4.56*    Recent Labs  11/28/2014 1900 12/05/14 0155  TROPONINI 1.49* 1.95*    Cardiac Studies: Echo 11/28: LV EF 50-55%, severe hypokinesis of the apical myocardium, grade 1 diastolic dysfunction, no evidence of thrombus, hypokinesis of RV lateral free wall, moderate regurg tricuspid and pulmonic valve, PA peak pressure 74mm Hg.  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  PEA arrest: unclear etiology. ?hypokalemia, potassium 2 on presentation. D-dimer >20. Prelim read of venous duplex LE with no  evidence of acute thrombosis in veins. DVT cannot be excluded at calf level, ?PE. Initial EKG with no acute ST changes or bundle branch block. Troponin increased from 1.07 to 1.95 - likely demand ischemia. -Hep gtt -With worsening renal function and shock, poor candidate for cath -DNR  Shock: Remains on Levophed and vasopressin. Multi organ failure with increasing transaminitis and creatinine.  AKI on CKD3: Baseline cr 1.2. On admission, cr 3.87. Increased to 4.56 today and anuric.  Metabolic acidosis with anion gap: Bicarb 12 (11 yesterday) with anion gap of 21. Lactic acid 10.76 on admission down to 9.3. CT abdomen/pelvis 11/28 with proximal colonic wall thickening, no evidence of bowel necrosis.  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 12/06/2014, 7:17 AM   I have seen, examined and evaluated the patient this AM along with Dr. Randell Patient on AM rounds.  Also discussed with Dr. Titus Mould from PCCM.  After reviewing all the available data and chart,  I agree with her findings, examination as well as impression recommendations.  No further arrythmia Remains in shock on pressors with worsening renal & hepatic function  PCCM treating with IV Heparin for ? DVT, but PEA is most likely related to HypoKalemia RV findings on Echo do not point to large enough PE to lead to shock.  At this point, no additional cardiology input available as we are now in watchful waiting.  Will sign off for now.  Pls call with questions.   Leonie Man, M.D., M.S. Interventional Cardiologist   Pager # [phone]

## 2014-12-06 NOTE — Progress Notes (Signed)
[place] for Heparin Indication: possible DVT  Allergies  Allergen Reactions  . Penicillins Itching and Swelling    Has patient had a PCN reaction causing immediate rash, facial/tongue/throat swelling, SOB or lightheadedness with hypotension:  Has patient had a PCN reaction causing severe rash involving mucus membranes or skin necrosis:  Has patient had a PCN reaction that required hospitalization  Has patient had a PCN reaction occurring within the last 10 years:  If all of the above answers are "NO", then may proceed with Cephalosporin use.    Patient Measurements: Height: 6\' 4"  (193 cm) Weight: 289 lb 14.5 oz (131.5 kg) IBW/kg (Calculated) : 82.3 Heparin Dosing Weight: 110 kg  Vital Signs: Temp: 97.6 F (36.4 C) (11/29 0000) Temp Source: Axillary (11/28 2000) BP: 85/75 mmHg (11/28 1800) Pulse Rate: 69 (11/28 2352)  Labs:  Recent Labs  11/11/2014 1150 11/10/2014 1437 11/14/2014 1900 12/05/14 0155 12/05/14 0600 12/05/14 1500 12/05/14 2250  HGB 11.8* 13.1  --   --  12.6  --   --   HCT 38.5 43.0  --   --  39.2  --   --   PLT 191 197  --   --  157  --   --   APTT  --  52*  --   --   --   --   --   LABPROT  --  22.3*  --   --   --   --   --   INR  --  1.97*  --   --   --   --   --   HEPARINUNFRC  --   --   --   --   --   --  <0.10*  CREATININE 3.87* 3.39* 3.70* 3.71* 3.85* 4.13* 4.40*  TROPONINI  --  1.07* 1.49* 1.95*  --   --   --     Estimated Creatinine Clearance: 20.5 mL/min (by C-G formula based on Cr of 4.4).  Assessment: 63 y.o. female with elevated D-Dimer, possible DVT (not able to exclude on Dopplers) for heparin  Goal of Therapy:  Heparin level 0.3-0.7 units/ml Monitor platelets by anticoagulation protocol: Yes   Plan:  Heparin 2500 units IV bolus, then increase heparin 2200 units/hr Follow-up am labs.    Vibhav Waddill, [place]urb 12/06/2014,12:56 AM

## 2014-12-07 ENCOUNTER — Inpatient Hospital Stay (HOSPITAL_COMMUNITY): Payer: Medicare Other

## 2014-12-07 DIAGNOSIS — C7B00 Secondary carcinoid tumors, unspecified site: Secondary | ICD-10-CM

## 2014-12-07 DIAGNOSIS — K529 Noninfective gastroenteritis and colitis, unspecified: Secondary | ICD-10-CM

## 2014-12-07 LAB — CBC WITH DIFFERENTIAL/PLATELET
BASOS ABS: 0 10*3/uL (ref 0.0–0.1)
Basophils Relative: 0 %
EOS ABS: 0 10*3/uL (ref 0.0–0.7)
Eosinophils Relative: 0 %
HEMATOCRIT: 33.6 % — AB (ref 36.0–46.0)
HEMOGLOBIN: 11.1 g/dL — AB (ref 12.0–15.0)
LYMPHS PCT: 9 %
Lymphs Abs: 1.5 10*3/uL (ref 0.7–4.0)
MCH: 26.1 pg (ref 26.0–34.0)
MCHC: 33 g/dL (ref 30.0–36.0)
MCV: 79.1 fL (ref 78.0–100.0)
Monocytes Absolute: 1.2 10*3/uL — ABNORMAL HIGH (ref 0.1–1.0)
Monocytes Relative: 7 %
NEUTROS PCT: 84 %
Neutro Abs: 14.4 10*3/uL — ABNORMAL HIGH (ref 1.7–7.7)
Platelets: 61 10*3/uL — ABNORMAL LOW (ref 150–400)
RBC: 4.25 MIL/uL (ref 3.87–5.11)
RDW: 16.2 % — ABNORMAL HIGH (ref 11.5–15.5)
WBC: 17.1 10*3/uL — ABNORMAL HIGH (ref 4.0–10.5)

## 2014-12-07 LAB — BASIC METABOLIC PANEL
ANION GAP: 14 (ref 5–15)
BUN: 26 mg/dL — ABNORMAL HIGH (ref 6–20)
CHLORIDE: 110 mmol/L (ref 101–111)
CO2: 16 mmol/L — ABNORMAL LOW (ref 22–32)
Calcium: 6.2 mg/dL — CL (ref 8.9–10.3)
Creatinine, Ser: 4.09 mg/dL — ABNORMAL HIGH (ref 0.44–1.00)
GFR calc Af Amer: 12 mL/min — ABNORMAL LOW (ref >60)
GFR, EST NON AFRICAN AMERICAN: 11 mL/min — AB (ref >60)
GLUCOSE: 173 mg/dL — AB (ref 65–99)
POTASSIUM: 2.9 mmol/L — AB (ref 3.5–5.1)
Sodium: 140 mmol/L (ref 135–145)

## 2014-12-07 LAB — COMPREHENSIVE METABOLIC PANEL
ALBUMIN: 1.6 g/dL — AB (ref 3.5–5.0)
ALT: 1209 U/L — ABNORMAL HIGH (ref 14–54)
AST: 3375 U/L — AB (ref 15–41)
Alkaline Phosphatase: 675 U/L — ABNORMAL HIGH (ref 38–126)
Anion gap: 16 — ABNORMAL HIGH (ref 5–15)
BUN: 25 mg/dL — AB (ref 6–20)
CHLORIDE: 112 mmol/L — AB (ref 101–111)
CO2: 13 mmol/L — AB (ref 22–32)
Calcium: 6.5 mg/dL — ABNORMAL LOW (ref 8.9–10.3)
Creatinine, Ser: 4.73 mg/dL — ABNORMAL HIGH (ref 0.44–1.00)
GFR calc Af Amer: 10 mL/min — ABNORMAL LOW (ref >60)
GFR calc non Af Amer: 9 mL/min — ABNORMAL LOW (ref >60)
GLUCOSE: 125 mg/dL — AB (ref 65–99)
POTASSIUM: 3.1 mmol/L — AB (ref 3.5–5.1)
SODIUM: 141 mmol/L (ref 135–145)
Total Bilirubin: 3.5 mg/dL — ABNORMAL HIGH (ref 0.3–1.2)
Total Protein: 5 g/dL — ABNORMAL LOW (ref 6.5–8.1)

## 2014-12-07 LAB — OSMOLALITY, STOOL: OSMOLALITY STL: 327 mosm/kg

## 2014-12-07 LAB — GLUCOSE, CAPILLARY
GLUCOSE-CAPILLARY: 139 mg/dL — AB (ref 65–99)
Glucose-Capillary: 126 mg/dL — ABNORMAL HIGH (ref 65–99)
Glucose-Capillary: 130 mg/dL — ABNORMAL HIGH (ref 65–99)
Glucose-Capillary: 143 mg/dL — ABNORMAL HIGH (ref 65–99)
Glucose-Capillary: 162 mg/dL — ABNORMAL HIGH (ref 65–99)

## 2014-12-07 LAB — BLOOD GAS, ARTERIAL
ACID-BASE DEFICIT: 11.9 mmol/L — AB (ref 0.0–2.0)
BICARBONATE: 12.1 meq/L — AB (ref 20.0–24.0)
Drawn by: 365271
FIO2: 0.4
LHR: 22 {breaths}/min
MECHVT: 660 mL
O2 SAT: 99.4 %
PATIENT TEMPERATURE: 98.3
PCO2 ART: 20.1 mmHg — AB (ref 35.0–45.0)
PEEP/CPAP: 5 cmH2O
PO2 ART: 356 mmHg — AB (ref 80.0–100.0)
TCO2: 12.7 mmol/L (ref 0–100)
pH, Arterial: 7.397 (ref 7.350–7.450)

## 2014-12-07 LAB — PROTIME-INR
INR: 2.28 — ABNORMAL HIGH (ref 0.00–1.49)
Prothrombin Time: 24.9 seconds — ABNORMAL HIGH (ref 11.6–15.2)

## 2014-12-07 LAB — HEPARIN LEVEL (UNFRACTIONATED): Heparin Unfractionated: 0.12 IU/mL — ABNORMAL LOW (ref 0.30–0.70)

## 2014-12-07 MED ORDER — LEVOFLOXACIN IN D5W 750 MG/150ML IV SOLN
750.0000 mg | INTRAVENOUS | Status: DC
  Filled 2014-12-07: qty 150

## 2014-12-07 MED ORDER — SODIUM CHLORIDE 0.9 % IV SOLN
25.0000 ug/h | INTRAVENOUS | Status: DC
  Administered 2014-12-07 – 2014-12-09 (×3): 25 ug/h via INTRAVENOUS
  Filled 2014-12-07 (×6): qty 1

## 2014-12-07 MED ORDER — LOPERAMIDE HCL 1 MG/5ML PO LIQD
2.0000 mg | Freq: Four times a day (QID) | ORAL | Status: DC
  Administered 2014-12-07 – 2014-12-08 (×4): 2 mg
  Filled 2014-12-07 (×8): qty 10

## 2014-12-07 MED ORDER — SODIUM BICARBONATE 8.4 % IV SOLN
INTRAVENOUS | Status: DC
  Administered 2014-12-07 – 2014-12-08 (×3): via INTRAVENOUS
  Filled 2014-12-07 (×7): qty 150

## 2014-12-07 MED ORDER — LEVOFLOXACIN IN D5W 500 MG/100ML IV SOLN
500.0000 mg | INTRAVENOUS | Status: DC
  Filled 2014-12-07: qty 100

## 2014-12-07 MED ORDER — POTASSIUM CHLORIDE 10 MEQ/50ML IV SOLN
10.0000 meq | INTRAVENOUS | Status: AC
  Administered 2014-12-07 (×4): 10 meq via INTRAVENOUS
  Filled 2014-12-07 (×3): qty 50

## 2014-12-07 MED ORDER — LEVOFLOXACIN IN D5W 750 MG/150ML IV SOLN
750.0000 mg | INTRAVENOUS | Status: AC
Start: 2014-12-07 — End: 2014-12-07
  Administered 2014-12-07: 750 mg via INTRAVENOUS
  Filled 2014-12-07: qty 150

## 2014-12-07 MED ORDER — VITAL HIGH PROTEIN PO LIQD
1000.0000 mL | ORAL | Status: DC
  Filled 2014-12-07 (×2): qty 1000

## 2014-12-07 MED ORDER — VITAL HIGH PROTEIN PO LIQD
1000.0000 mL | ORAL | Status: DC
  Administered 2014-12-07: 1000 mL
  Filled 2014-12-07 (×3): qty 1000

## 2014-12-07 MED ORDER — POTASSIUM CHLORIDE 10 MEQ/100ML IV SOLN
10.0000 meq | INTRAVENOUS | Status: AC
  Administered 2014-12-07 (×4): 10 meq via INTRAVENOUS
  Filled 2014-12-07 (×4): qty 100

## 2014-12-07 MED ORDER — HEPARIN (PORCINE) IN NACL 100-0.45 UNIT/ML-% IJ SOLN
2500.0000 [IU]/h | INTRAMUSCULAR | Status: DC
  Administered 2014-12-07: 2500 [IU]/h via INTRAVENOUS
  Administered 2014-12-07: 2200 [IU]/h via INTRAVENOUS
  Administered 2014-12-08: 2500 [IU]/h via INTRAVENOUS
  Filled 2014-12-07 (×3): qty 250

## 2014-12-07 MED ORDER — POTASSIUM CHLORIDE 10 MEQ/50ML IV SOLN
INTRAVENOUS | Status: AC
  Administered 2014-12-07: 10 meq via INTRAVENOUS
  Filled 2014-12-07: qty 50

## 2014-12-07 NOTE — Progress Notes (Signed)
PULMONARY / CRITICAL CARE MEDICINE   Name: Yesenia Lyons MRN: [id number] DOB: July 23, 1950    ADMISSION DATE:  11/10/2014 CONSULTATION DATE: 11/27  REFERRING MD :  EDP, DR Schlossman  CHIEF COMPLAINT:  arrest  HISTORY OF PRESENT ILLNESS:   64 yr old female, h/o carcinoid tumor liver, chronic diarhea, poor intake, coded pea home on toilet 6 min , K noted low  SUBJECTIVE: levo down, low output, stools increased  VITAL SIGNS: BP 88/49 mmHg  Pulse 71  Temp(Src) 98.3 F (36.8 C) (Axillary)  Resp 25  Ht 6\' 4"  (1.93 m)  Wt 137.4 kg (302 lb 14.6 oz)  BMI 36.89 kg/m2  SpO2 100%  HEMODYNAMICS: CVP:  [5 mmHg-9 mmHg] 5 mmHg  VENTILATOR SETTINGS: Vent Mode:  [-] CPAP;PSV FiO2 (%):  [30 %-40 %] 40 % Set Rate:  [22 bmp] 22 bmp Vt Set:  [660 mL] 660 mL PEEP:  [5 cmH20] 5 cmH20 Pressure Support:  [5 cmH20-8 cmH20] 5 cmH20 Plateau Pressure:  [26 cmH20-28 cmH20] 26 cmH20  INTAKE / OUTPUT: I/O last 3 completed shifts: In: 6294.3 [I.V.:2974.3; IV Piggyback:3320] Out: [id number] [Urine:465; Emesis/NG output:200; Stool:3300]  PHYSICAL EXAMINATION: General: rass 0, awake Neuro:  Moving upper ext, rass 0, follows simple commands HEENT:  Obese, line clean Cardiovascular:  s1 s2 RRR distant Lungs:  reduced Abdomen:  Old scars, obese, Tenderness mild , ND., ro r/g Musculoskeletal:  gen 1 edema Skin:  dry  LABS:  CBC  Recent Labs Lab 12/06/14 0400 12/06/14 1800 12/07/14 0630  WBC 18.8* 17.2* 17.1*  HGB 11.5* 10.7* 11.1*  HCT 35.2* 33.4* 33.6*  PLT 75* 70* 61*   Coag's  Recent Labs Lab 12/01/2014 1437 12/06/14 0400 12/07/14 0630  APTT 52*  --   --   INR 1.97* 2.83* 2.28*   BMET  Recent Labs Lab 12/06/14 1350 12/06/14 2200 12/07/14 0630  NA 142 142 141  K 3.0* 2.9* 3.1*  CL 114* 112* 112*  CO2 15* 13* 13*  BUN 20 26* 25*  CREATININE 4.41* 4.46* 4.73*  GLUCOSE 160* 143* 125*   Electrolytes  Recent Labs Lab 11/19/2014 1437  12/05/14 0600  12/06/14 0400  12/06/14 1350 12/06/14 2200 12/07/14 0630  CALCIUM 6.5*  < > 6.6*  < > 6.6* 5.8* 6.5* 6.5*  MG 1.7  --  1.7  --  2.1  --   --   --   PHOS 6.4*  --  6.0*  --   --   --   --   --   < > = values in this interval not displayed. Sepsis Markers  Recent Labs Lab 12/06/2014 1437 11/10/2014 1453 12/02/2014 2305 12/05/14 0155  LATICACIDVEN 9.8* 11.73* 10.9* 9.3*  PROCALCITON 3.91  --   --   --    ABG  Recent Labs Lab 12/05/14 0616 12/06/14 0320 12/07/14 0331  PHART 7.420 7.371 7.397  PCO2ART 17.1* 19.6* 20.1*  PO2ART 135.0* 168* 356*   Liver Enzymes  Recent Labs Lab 12/03/2014 1437 12/06/14 0400 12/07/14 0630  AST 1173* 6723* 3375*  ALT 366* 1580* 1209*  ALKPHOS 384* 693* 675*  BILITOT 2.4* 3.4* 3.5*  ALBUMIN 1.9* 1.8* 1.6*   Cardiac Enzymes  Recent Labs Lab 12/03/2014 1437 11/15/2014 1900 12/05/14 0155  TROPONINI 1.07* 1.49* 1.95*   Glucose  Recent Labs Lab 12/06/14 1143 12/06/14 1608 12/06/14 1945 12/06/14 2356 12/07/14 0337 12/07/14 0737  GLUCAP 156* 137* 138* 122* 126* 130*    Imaging Dg Chest Port 1 View  12/07/2014  CLINICAL DATA:  Cardiac arrest, hypertensive shot, acute on chronic renal failure. EXAM: PORTABLE CHEST 1 VIEW COMPARISON:  Portable chest x-ray of December 06, 2014 FINDINGS: The lungs remain hypoinflated. The retrocardiac region on the left remains dense. The cardiac silhouette remains enlarged and the central pulmonary vascularity engorged. The endotracheal tube tip lies 2.8 cm above the carina. The esophagogastric tube tip projects below the inferior margin of the image. The left internal jugular venous catheter tip projects over the distal SVC. IMPRESSION: Stable appearance of the chest since yesterday's study. Persistent left lower lobe atelectasis or pneumonia. Stable cardiomegaly with central pulmonary vascular congestion but without significant interstitial edema. The support tubes are in reasonable position. Electronically Signed   By: David   Martinique M.D.   On: 12/07/2014 07:11     STUDIES:  Echo 11/27>>>rv hypokinetic, mod dilated, pa 40, LV 50%-55% CT 11/27>>>No evidence of bowel necrosis. 2. Proximal colonic wall thickening which could be ischemic or infectious.6 cm left adrenal mass, favor metastasis over hematoma. History of carcinoid with extensive intra-abdominal adenopathy and hepatic metastatic disease.  CULTURES: BC 11/27>>> cdiff neg 11/27 O and P 11/28>>> Stool culture / enteric pathogens 11/28>>>  ANTIBIOTICS: ceftaz 11/27>>> vanc 11/27>>> Flagyll 11/27>>>  SIGNIFICANT EVENTS: 11/27- arrest 56 min , pea , unwitnessed , low K , electrolytes issue 11/28- levo down, urine output low 11/29- arf, crt rise, cvp 2, volume given   LINES/TUBES: 11/27 left IJ>>> 11/27 ett>>> 11/27 rt fem a line>>>  ASSESSMENT / PLAN:  PULMONARY A: Acute resp failure Left lower lobe ? mass P:  ABg reviewed, keep same MV Wean cpap5 sp5, goal 30 in , consider extubation with DNI pressors are stable pcxr in am, no sig edema Mental tstaus supports extubation  CARDIOVASCULAR A:  S/p arrest, r/o secondary to hypokalemia, vaso vagal and hypovolemia RV on echo (if PE) does not explain shock state) P:  Levophed to map 60, vasopressin cvp 6, bolus again , was only 2.7 positive after such sig volume given Add immodium , need to slow output  RENAL A:   ARF, unknown baseline, lasix diuretic dependent, lactic acidosis post arrest, NONAG increased, r;o dead bowel proximal colon P:  , hypokalemia Chem q8h Add bicarb for NONAG Slow stool output  GASTROINTESTINAL A:   LIver cancer carcinoid Extesnive, r/o dead bowel, shock liver worsening, chronic diahrea from carcinoid, Abdo soft on examination P:   LFT in am CT reviewed, not overwhelming Massive stool volume output Npo ppi Stool studies remain Consider feeding trickle, 10 cc/hr no increase LFt resolving, repeat in am  Add octreotide to inhibit ser iimodium  scheduled  HEMATOLOGIC A:   DVT prevention, Doubt PE clinically significant, coagulapthy on own ( liver carcinoid), thrombocytopenia ( MODS, liver cancer), r/o dioc  P:  Doppler neg, rv does not explain shock state scd for now Fibrinogen, factor 8pending Use hep when inr less 2.0 Plat are diluting , concerning, may need to dc heparin and use liperudine, NOT argatroban with liver  INFECTIOUS A:  Immunocompromised host, r/o dead bowel, r;io infectious diarrhea (unloikley)  P:   11/27 vanc>>>11/30 11/27 ceftaz>>>11/30 11/27 flagyl>>> 11/27 anidulo>>>11/28 11/30 levofloxacin>>>  Stool work up Dc vanc [place] ceftaz to levo fo GI coverage   ENDOCRINE A:   No evidence AI Octreotide can increase glu P:   ssi  NEUROLOGIC A:   S/p arrest, r/o anoxia (woke up post code), high risk hepatic encpeh P:   RASS goal: -1 fent not much needed wua Avoid  benzo with liver Until stool output improves, will hold lactulose    FAMILY  - Updates: daughter 11/27, 28, 29, called 30th  - Inter-disciplinary family meet or Palliative Care meeting due by:  12/1  Ccm time 40 min   Family to be updated Lavon Paganini. Titus Mould, MD, [place] Pgr: [facility]

## 2014-12-07 NOTE — Progress Notes (Signed)
Nutrition Follow-up  DOCUMENTATION CODES:   Obesity unspecified  INTERVENTION:   Vital High Protein @ 10 ml/hr - do not advance  Provides: 240 kcal, 21 grams protein, and 200 ml H2O  If pt tolerated enteral nutrition goal rate would be Vital High Protein at 75 ml/hr with 30 ml Prostat daily (provides: 1900 kcal, 173 grams protein, and 1505 ml H2O).   NUTRITION DIAGNOSIS:   Inadequate oral intake related to inability to eat as evidenced by NPO status. Ongoing  GOAL:   Provide needs based on ASPEN/SCCM guidelines Not met.   MONITOR:   Vent status, Labs, Weight trends, Skin, I & O's  REASON FOR ASSESSMENT:   Consult Enteral/tube feeding initiation and management  ASSESSMENT:   64 yr old female, h/o carcinoid tumor liver, chronic diarhea, poor intake, coded pea home on toilet 6 min , K noted low  Patient is currently intubated on ventilator support MV: 8.80mn Temp (24hrs), Avg:97.9 F (36.6 C), Min:97.2 F (36.2 C), Max:98.3 F (36.8 C)  Medications reviewed and include: imodium, flagyl, octerotide, and KCl. Labs reviewed: potassium low (3.1), alkaline Phosphatase elevated (675) CBG's: 126-130 OG tube in place Concern for bowel ischemia - per 11/28 CT no necrosis/obstruction/or ileus Per MD notes start trickle feedings today @ 10 ml/hr with no advancement.   Diet Order:  Diet NPO time specified  Skin:  Wound (see comment) (bilateral leg cellulitis)  Last BM:  11/21/2014  Height:   Ht Readings from Last 1 Encounters:  12/01/2014 '6\' 4"'  (1.93 m)   Weight:   Wt Readings from Last 1 Encounters:  12/07/14 302 lb 14.6 oz (137.4 kg)   Ideal Body Weight:  81.8 kg  BMI:  Body mass index is 36.89 kg/(m^2).  Estimated Nutritional Needs:   Kcal:  [address] Protein:  >164 grams  Fluid:  per MD  EDUCATION NEEDS:   No education needs identified at this time  [place] LRevloc CStillwaterPager [phone] Hours Pager

## 2014-12-07 NOTE — Progress Notes (Signed)
[facility] Progress Note Patient Name: Ruberta Esmeralda DOB: [birth year] MRN: [id number]   Date of Service  12/07/2014  HPI/Events of Note  K+ = 2.9 and Creatinine = 1.09.  eICU Interventions  Will replete K+.      Intervention Category Intermediate Interventions: Electrolyte abnormality - evaluation and management  Sommer,Steven Eugene 12/07/2014, 6:38 PM

## 2014-12-07 NOTE — Progress Notes (Signed)
ANTICOAGULATION CONSULT NOTE - Follow up  Pharmacy Consult for Heparin Indication: R/o PE, Elevated D-Dimer  Allergies  Allergen Reactions  . Penicillins Itching and Swelling    Patient Measurements: Height: 6\' 4"  (193 cm) Weight: 289 lb 14.5 oz (131.5 kg) IBW/kg (Calculated) : 82.3 Heparin Dosing Weight: 111 kg  Vital Signs: Temp: 98.1 F (36.7 C) (11/28 1315) Temp Source: Axillary (11/28 1315) BP: 107/57 mmHg (11/28 0319) Pulse Rate: 45 (11/28 0529)  Labs:  Recent Labs  12/05/2014 1150 12/06/2014 1437 12/03/2014 1900 12/05/14 0155 12/05/14 0600  HGB 11.8* 13.1  --   --  12.6  HCT 38.5 43.0  --   --  39.2  PLT 191 197  --   --  157  APTT  --  52*  --   --   --   LABPROT  --  22.3*  --   --   --   INR  --  1.97*  --   --   --   CREATININE 3.87* 3.39* 3.70* 3.71* 3.85*  TROPONINI  --  1.07* 1.49* 1.95*  --      Assessment: 64 year old female with elevated D-Dimer (> 20) and elevated INR of 1.97 s/p PEA arrest. She is on heparin for r/o DVT - doppler completed but DVT could not be excluded. INR 2.28 and hep gtt started at 2200 units. HL goal 0.3-0.5 in light of shocked liver (elevated INR and AST/ALT).  HL 0.12 on 2200 units/hr  Goal of Therapy:  Heparin level 0.3-0.5 units/ml Monitor platelets by anticoagulation protocol: Yes   Plan:  1. Increase heparin drip to 2500 units/hr 2. HL in around 6 hours 3. Daily HL 4. INR daily as per MD  Vincenza Hews, PharmD, BCPS 12/07/2014, 5:34 PM Pager: [phone]

## 2014-12-07 NOTE — Progress Notes (Signed)
Daily Rounding Note  12/07/2014, 1:14 PM  LOS: 3 days   SUBJECTIVE:       Note that pt had received Lactulose from 11/27 PM through 2100 on 11/29.  It is not discontinued   Diarrhea persists, watery/brown, no blood.   Pt remains intubated.  Tube feeding initiated this afternoon.  Remains on Levophed, Vasopressin.   OBJECTIVE:         Vital signs in last 24 hours:    Temp:  [97.2 F (36.2 C)-98.5 F (36.9 C)] 98.5 F (36.9 C) (11/30 1223) Pulse Rate:  [55-80] 70 (11/30 1230) Resp:  [0-28] 23 (11/30 1230) SpO2:  [95 %-100 %] 100 % (11/30 1230) Arterial Line BP: (67-124)/(39-65) 78/39 mmHg (11/30 1230) FiO2 (%):  [30 %-40 %] 40 % (11/30 1223) Weight:  [137.4 kg (302 lb 14.6 oz)] 137.4 kg (302 lb 14.6 oz) (11/30 0500) Last BM Date: 12/07/14 Filed Weights   12/05/14 0500 12/06/14 0400 12/07/14 0500  Weight: 131.5 kg (289 lb 14.5 oz) 133.1 kg (293 lb 6.9 oz) 137.4 kg (302 lb 14.6 oz)   General: intubated, unresponsive to voice and exam.    Heart: RRR Chest: clear in front Abdomen: obese, palpable mass/fullness in mid upper abdomen is at least 30 Cm across.  Pt seems to grimmace when upper belly palpated.   Extremities: + LE and UE edema.  Neuro/Psych:  Unable to assess as pt is sedated.   Intake/Output from previous day: 11/29 0701 - 11/30 0700 In: 5448.7 [I.V.:2378.7; IV Piggyback:3070] Out: 2515 [Urine:465; Emesis/NG output:50; Stool:2000]  Intake/Output this shift: Total I/O In: 1060.3 [I.V.:810.3; NG/GT:150; IV Piggyback:100] Out: 1100 [Urine:200; Stool:900]  Lab Results:  Recent Labs  12/06/14 0400 12/06/14 1800 12/07/14 0630  WBC 18.8* 17.2* 17.1*  HGB 11.5* 10.7* 11.1*  HCT 35.2* 33.4* 33.6*  PLT 75* 70* 61*   BMET  Recent Labs  12/06/14 1350 12/06/14 2200 12/07/14 0630  NA 142 142 141  K 3.0* 2.9* 3.1*  CL 114* 112* 112*  CO2 15* 13* 13*  GLUCOSE 160* 143* 125*  BUN 20 26* 25*    CREATININE 4.41* 4.46* 4.73*  CALCIUM 5.8* 6.5* 6.5*   LFT  Recent Labs  11/29/2014 1437 12/06/14 0400 12/07/14 0630  PROT 5.7* 5.4* 5.0*  ALBUMIN 1.9* 1.8* 1.6*  AST 1173* 6723* 3375*  ALT 366* 1580* 1209*  ALKPHOS 384* 693* 675*  BILITOT 2.4* 3.4* 3.5*   PT/INR  Recent Labs  12/06/14 0400 12/07/14 0630  LABPROT 29.3* 24.9*  INR 2.83* 2.28*   Hepatitis Panel No results for input(s): HEPBSAG, HCVAB, HEPAIGM, HEPBIGM in the last 72 hours.  Studies/Results: Dg Chest Port 1 View  12/07/2014  CLINICAL DATA:  Cardiac arrest, hypertensive shot, acute on chronic renal failure. EXAM: PORTABLE CHEST 1 VIEW COMPARISON:  Portable chest x-ray of December 06, 2014 FINDINGS: The lungs remain hypoinflated. The retrocardiac region on the left remains dense. The cardiac silhouette remains enlarged and the central pulmonary vascularity engorged. The endotracheal tube tip lies 2.8 cm above the carina. The esophagogastric tube tip projects below the inferior margin of the image. The left internal jugular venous catheter tip projects over the distal SVC. IMPRESSION: Stable appearance of the chest since yesterday's study. Persistent left lower lobe atelectasis or pneumonia. Stable cardiomegaly with central pulmonary vascular congestion but without significant interstitial edema. The support tubes are in reasonable position. Electronically Signed   By: David  Martinique M.D.   On: 12/07/2014  07:11   Dg Chest Port 1 View  12/06/2014  CLINICAL DATA:  Status post cardiac arrest, shock, acute and chronic renal failure, liver malignancy. EXAM: PORTABLE CHEST 1 VIEW COMPARISON:  Portable chest x-ray of August 04, 2014 FINDINGS: The lung volumes remain low. The retrocardiac region on the left remains dense. There is no large pleural effusion or evidence of a pneumothorax. The cardiac silhouette is enlarged. The central pulmonary vascularity is engorged. The endotracheal tube lies approximately 2.7 cm above the  carina. The esophagogastric tube tip and proximal port project below the inferior margin of the image. IMPRESSION: Hypoinflation with persistent left lower lobe atelectasis or infiltrate. Stable enlargement of cardiac silhouette without significant pulmonary vascular congestion. Electronically Signed   By: David  Martinique M.D.   On: 12/06/2014 07:33   Scheduled Meds: . antiseptic oral rinse  7 mL Mouth Rinse 10 times per day  . chlorhexidine gluconate  15 mL Mouth Rinse BID  . feeding supplement (VITAL HIGH PROTEIN)  1,000 mL Per Tube Q24H  . insulin aspart  0-9 Units Subcutaneous 6 times per day  . lactulose  30 g Per Tube TID  . [START ON 12/09/2014] levofloxacin (LEVAQUIN) IV  500 mg Intravenous Q48H  . levofloxacin (LEVAQUIN) IV  750 mg Intravenous Q24H  . loperamide  2 mg Per Tube Q6H  . metronidazole  500 mg Intravenous Q8H  . pantoprazole (PROTONIX) IV  40 mg Intravenous QHS  . potassium chloride  10 mEq Intravenous Q1 Hr x 4  . sodium chloride  10-40 mL Intracatheter Q12H   Continuous Infusions: . heparin 2,200 Units/hr (12/07/14 0728)  . norepinephrine (LEVOPHED) Adult infusion 19 mcg/min (12/07/14 1230)  . octreotide  (SANDOSTATIN)    IV infusion 25 mcg/hr (12/07/14 1045)  .  sodium bicarbonate  infusion 1000 mL 150 mL/hr at 12/07/14 1036  . 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 C diff negative.  FOBT +. Stool clx ordered CT: thickened prox colon: infectious vs ischemic.   *  Shock liver.  LFTs improved.   *  Hypokalemia improved.   *  Stage 4 CKD  *  Thrombocytopenia.   *  Coagulopathy.   *   S/p PEA cardiac arrest.   PLAN   *  Continue octreotide, if diarrhea continues despite the lactulose being discontinued, ? Add Questran?, RN says will not  be a problem giving this via NGT.     Azucena Freed  12/07/2014, 1:14 PM Pager: [phone]

## 2014-12-07 NOTE — Progress Notes (Signed)
Potassium and calcium called to [place]. Yesenia Lyons

## 2014-12-07 NOTE — Progress Notes (Signed)
[place] for Heparin Indication: possible DVT  Allergies  Allergen Reactions  . Penicillins Itching and Swelling    Patient Measurements: Height: 6\' 4"  (193 cm) Weight: (!) 302 lb 14.6 oz (137.4 kg) IBW/kg (Calculated) : 82.3 Heparin Dosing Weight: 110 kg  Vital Signs: Temp: 98.3 F (36.8 C) (11/30 0000) Temp Source: Axillary (11/30 0000) Pulse Rate: 65 (11/30 0700)  Labs:  Recent Labs  11/21/2014 1437 11/22/2014 1900 12/05/14 0155  12/05/14 2250 12/06/14 0400 12/06/14 1350 12/06/14 1800 12/06/14 2200 12/07/14 0630  HGB 13.1  --   --   < >  --  11.5*  --  10.7*  --  11.1*  HCT 43.0  --   --   < >  --  35.2*  --  33.4*  --  33.6*  PLT 197  --   --   < >  --  75*  --  70*  --  PENDING  APTT 52*  --   --   --   --   --   --   --   --   --   LABPROT 22.3*  --   --   --   --  29.3*  --   --   --  24.9*  INR 1.97*  --   --   --   --  2.83*  --   --   --  2.28*  HEPARINUNFRC  --   --   --   --  <0.10*  --   --   --   --   --   CREATININE 3.39* 3.70* 3.71*  < > 4.40* 4.56* 4.41*  --  4.46*  --   TROPONINI 1.07* 1.49* 1.95*  --   --   --   --   --   --   --   < > = values in this interval not displayed.  Estimated Creatinine Clearance: 20.7 mL/min (by C-G formula based on Cr of 4.46).  Assessment: 64 y.o. female with elevated D-Dimer, possible DVT (not able to exclude on Dopplers) for heparin. INR now back down to 2.28. H/H stable. Plt count pending. Will restart heparin per Dr. Titus Mould since INR < 2.5.  Goal of Therapy:  Heparin level 0.3-0.7 units/ml Monitor platelets by anticoagulation protocol: Yes   Plan:  Restart heparin 2200 units/hr F/u 8 hour heparin level and plt count which is still pending.  Sherlon Handing, PharmD, BCPS Clinical pharmacist, pager [phone] 12/07/2014,7:06 AM

## 2014-12-08 ENCOUNTER — Inpatient Hospital Stay (HOSPITAL_COMMUNITY): Payer: Medicare Other

## 2014-12-08 ENCOUNTER — Encounter (HOSPITAL_COMMUNITY): Payer: Self-pay | Admitting: Physician Assistant

## 2014-12-08 DIAGNOSIS — Z4659 Encounter for fitting and adjustment of other gastrointestinal appliance and device: Secondary | ICD-10-CM | POA: Insufficient documentation

## 2014-12-08 DIAGNOSIS — Z87898 Personal history of other specified conditions: Secondary | ICD-10-CM

## 2014-12-08 DIAGNOSIS — C7B00 Secondary carcinoid tumors, unspecified site: Secondary | ICD-10-CM | POA: Insufficient documentation

## 2014-12-08 DIAGNOSIS — K529 Noninfective gastroenteritis and colitis, unspecified: Secondary | ICD-10-CM | POA: Insufficient documentation

## 2014-12-08 LAB — BLOOD GAS, ARTERIAL
Acid-base deficit: 8.3 mmol/L — ABNORMAL HIGH (ref 0.0–2.0)
BICARBONATE: 14.2 meq/L — AB (ref 20.0–24.0)
Drawn by: 365271
FIO2: 0.4
O2 Saturation: 98.2 %
PATIENT TEMPERATURE: 98.6
PCO2 ART: 17.6 mmHg — AB (ref 35.0–45.0)
PEEP: 5 cmH2O
RATE: 22 resp/min
TCO2: 14.7 mmol/L (ref 0–100)
VT: 660 mL
pH, Arterial: 7.517 — ABNORMAL HIGH (ref 7.350–7.450)
pO2, Arterial: 113 mmHg — ABNORMAL HIGH (ref 80.0–100.0)

## 2014-12-08 LAB — COMPREHENSIVE METABOLIC PANEL
ALT: 910 U/L — AB (ref 14–54)
AST: 1751 U/L — AB (ref 15–41)
Albumin: 1.6 g/dL — ABNORMAL LOW (ref 3.5–5.0)
Alkaline Phosphatase: 676 U/L — ABNORMAL HIGH (ref 38–126)
Anion gap: 19 — ABNORMAL HIGH (ref 5–15)
BILIRUBIN TOTAL: 3.4 mg/dL — AB (ref 0.3–1.2)
BUN: 26 mg/dL — AB (ref 6–20)
CO2: 15 mmol/L — ABNORMAL LOW (ref 22–32)
CREATININE: 4.25 mg/dL — AB (ref 0.44–1.00)
Calcium: 6.5 mg/dL — ABNORMAL LOW (ref 8.9–10.3)
Chloride: 106 mmol/L (ref 101–111)
GFR, EST AFRICAN AMERICAN: 12 mL/min — AB (ref >60)
GFR, EST NON AFRICAN AMERICAN: 10 mL/min — AB (ref >60)
Glucose, Bld: 176 mg/dL — ABNORMAL HIGH (ref 65–99)
Potassium: 2.9 mmol/L — ABNORMAL LOW (ref 3.5–5.1)
Sodium: 140 mmol/L (ref 135–145)
TOTAL PROTEIN: 5.5 g/dL — AB (ref 6.5–8.1)

## 2014-12-08 LAB — CBC WITH DIFFERENTIAL/PLATELET
BASOS ABS: 0 10*3/uL (ref 0.0–0.1)
Basophils Relative: 0 %
EOS ABS: 0.2 10*3/uL (ref 0.0–0.7)
Eosinophils Relative: 1 %
HCT: 34.3 % — ABNORMAL LOW (ref 36.0–46.0)
Hemoglobin: 11.3 g/dL — ABNORMAL LOW (ref 12.0–15.0)
LYMPHS ABS: 2.5 10*3/uL (ref 0.7–4.0)
Lymphocytes Relative: 13 %
MCH: 26.1 pg (ref 26.0–34.0)
MCHC: 32.9 g/dL (ref 30.0–36.0)
MCV: 79.2 fL (ref 78.0–100.0)
MONO ABS: 2.1 10*3/uL — AB (ref 0.1–1.0)
Monocytes Relative: 11 %
NEUTROS PCT: 75 %
Neutro Abs: 14.5 10*3/uL — ABNORMAL HIGH (ref 1.7–7.7)
PLATELETS: 68 10*3/uL — AB (ref 150–400)
RBC: 4.33 MIL/uL (ref 3.87–5.11)
RDW: 16.3 % — AB (ref 11.5–15.5)
WBC Morphology: INCREASED
WBC: 19.3 10*3/uL — AB (ref 4.0–10.5)

## 2014-12-08 LAB — POCT I-STAT 3, ART BLOOD GAS (G3+)
Acid-base deficit: 5 mmol/L — ABNORMAL HIGH (ref 0.0–2.0)
Bicarbonate: 18.9 mEq/L — ABNORMAL LOW (ref 20.0–24.0)
O2 SAT: 94 %
PCO2 ART: 32.6 mmHg — AB (ref 35.0–45.0)
PO2 ART: 75 mmHg — AB (ref 80.0–100.0)
Patient temperature: 99
TCO2: 20 mmol/L (ref 0–100)
pH, Arterial: 7.373 (ref 7.350–7.450)

## 2014-12-08 LAB — OVA + PARASITE EXAM

## 2014-12-08 LAB — GLUCOSE, CAPILLARY
GLUCOSE-CAPILLARY: 156 mg/dL — AB (ref 65–99)
GLUCOSE-CAPILLARY: 165 mg/dL — AB (ref 65–99)
GLUCOSE-CAPILLARY: 180 mg/dL — AB (ref 65–99)
GLUCOSE-CAPILLARY: 202 mg/dL — AB (ref 65–99)
Glucose-Capillary: 183 mg/dL — ABNORMAL HIGH (ref 65–99)
Glucose-Capillary: 124 mg/dL — ABNORMAL HIGH (ref 65–99)
Glucose-Capillary: 162 mg/dL — ABNORMAL HIGH (ref 65–99)

## 2014-12-08 LAB — PROTIME-INR
INR: 2.07 — AB (ref 0.00–1.49)
PROTHROMBIN TIME: 23.1 s — AB (ref 11.6–15.2)

## 2014-12-08 LAB — CORTISOL: Cortisol, Plasma: 32.4 ug/dL

## 2014-12-08 LAB — FACTOR 8 ASSAY: Coagulation Factor VIII: 393 % — ABNORMAL HIGH (ref 57–163)

## 2014-12-08 LAB — HEPARIN LEVEL (UNFRACTIONATED)
Heparin Unfractionated: 0.13 IU/mL — ABNORMAL LOW (ref 0.30–0.70)
Heparin Unfractionated: 0.14 IU/mL — ABNORMAL LOW (ref 0.30–0.70)

## 2014-12-08 LAB — O&P RESULT

## 2014-12-08 MED ORDER — LOPERAMIDE HCL 1 MG/5ML PO LIQD
4.0000 mg | Freq: Four times a day (QID) | ORAL | Status: DC
Start: 2014-12-08 — End: 2014-12-13
  Administered 2014-12-08 – 2014-12-13 (×19): 4 mg
  Filled 2014-12-08 (×29): qty 20

## 2014-12-08 MED ORDER — SODIUM BICARBONATE 8.4 % IV SOLN
INTRAVENOUS | Status: DC
  Administered 2014-12-09: 04:00:00 via INTRAVENOUS
  Filled 2014-12-08 (×2): qty 150

## 2014-12-08 MED ORDER — SODIUM CHLORIDE 0.45 % IV SOLN
INTRAVENOUS | Status: DC
  Administered 2014-12-08 – 2014-12-11 (×6): via INTRAVENOUS

## 2014-12-08 MED ORDER — POTASSIUM CHLORIDE 20 MEQ/15ML (10%) PO SOLN
40.0000 meq | ORAL | Status: AC
  Administered 2014-12-08 (×2): 40 meq
  Filled 2014-12-08 (×2): qty 30

## 2014-12-08 MED ORDER — CETYLPYRIDINIUM CHLORIDE 0.05 % MT LIQD
7.0000 mL | Freq: Two times a day (BID) | OROMUCOSAL | Status: DC
  Administered 2014-12-09 – 2014-12-10 (×3): 7 mL via OROMUCOSAL

## 2014-12-08 NOTE — Progress Notes (Signed)
[facility] Progress Note Patient Name: Yesenia Lyons DOB: [birth year] MRN: [id number]   Date of Service  12/08/2014  HPI/Events of Note  Hypokalemia  eICU Interventions  Potassium replaced     Intervention Category Intermediate Interventions: Electrolyte abnormality - evaluation and management  DETERDING,ELIZABETH 12/08/2014, 5:03 AM

## 2014-12-08 NOTE — Progress Notes (Signed)
CRITICAL VALUE ALERT  Critical value received: blood culture came back with gram positive rods, diphtheroids -- likely contaminated  Date of notification:  12/08/14  Time of notification:  1130  Critical value read back:Yes.    Nurse who received alert:  Alben Deeds, RN  MD notified (1st page):  Titus Mould -- in person  Time MD responded:  1135. Agreed that result was contamination due to time frame of growth.  Will continue to monitor.

## 2014-12-08 NOTE — Care Management Important Message (Signed)
Important Message  Patient Details  Name: Yesenia Lyons MRN: [id number] Date of Birth: 03-08-1950   Medicare Important Message Given:  Yes    [name] P Alyric Parkin 12/08/2014, 1:16 PM

## 2014-12-08 NOTE — Progress Notes (Signed)
Daily Rounding Note  12/08/2014, 11:37 AM  LOS: 4 days   SUBJECTIVE:    Remains on Levophed     flexiseal output ~ 1.6 liters yesterday. C/w 1.3 and 2 liters on previous days.  Output 500 cc in last 24 hours, now has pinkish/bloody tinge to it. Still completely watery. Pt grimmaces with any movement and with abd exam.   Extubated this AM.  Remains encephalopathic   OBJECTIVE:         Vital signs in last 24 hours:    Temp:  [98 F (36.7 C)-99.5 F (37.5 C)] 99 F (37.2 C) (12/01 0800) Pulse Rate:  [63-82] 72 (12/01 1100) Resp:  [14-31] 29 (12/01 1100) BP: (79)/(52) 79/52 mmHg (12/01 0836) SpO2:  [97 %-100 %] 99 % (12/01 1100) Arterial Line BP: (68-136)/(39-86) 115/86 mmHg (12/01 1100) FiO2 (%):  [30 %-40 %] 40 % (12/01 0837) Weight:  [142.4 kg (313 lb 15 oz)] 142.4 kg (313 lb 15 oz) (12/01 0400) Last BM Date: 12/07/14 Filed Weights   12/06/14 0400 12/07/14 0500 12/08/14 0400  Weight: 133.1 kg (293 lb 6.9 oz) 137.4 kg (302 lb 14.6 oz) 142.4 kg (313 lb 15 oz)   General: eyes open but no meaningful interaction.     Heart: RRR  Chest: clear bil.  No labored breathing Abdomen: obese, no BS. No tinkling or tympanitic sounds.  Tender.  Cantelope sized mass in upper mid abdomen  Extremities: + obese and edematous Neuro/Psych:  Unresponsive to voice.  No spontaneous movement or tremor.    Lab Results:  Recent Labs  12/06/14 1800 12/07/14 0630 12/08/14 0200  WBC 17.2* 17.1* 19.3*  HGB 10.7* 11.1* 11.3*  HCT 33.4* 33.6* 34.3*  PLT 70* 61* 68*   BMET  Recent Labs  12/07/14 0630 12/07/14 1742 12/08/14 0200  NA 141 140 140  K 3.1* 2.9* 2.9*  CL 112* 110 106  CO2 13* 16* 15*  GLUCOSE 125* 173* 176*  BUN 25* 26* 26*  CREATININE 4.73* 4.09* 4.25*  CALCIUM 6.5* 6.2* 6.5*   LFT  Recent Labs  12/06/14 0400 12/07/14 0630 12/08/14 0200  PROT 5.4* 5.0* 5.5*  ALBUMIN 1.8* 1.6* 1.6*  AST 6723* 3375* 1751*    ALT 1580* 1209* 910*  ALKPHOS 693* 675* 676*  BILITOT 3.4* 3.5* 3.4*   PT/INR  Recent Labs  12/07/14 0630 12/08/14 0200  LABPROT 24.9* 23.1*  INR 2.28* 2.07*     ASSESMENT:   * 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 C diff negative. FOBT +. Stool O&P negative, clx pending. Stool osmolality 327 Stool clx ordered CT: thickened prox colon: infectious vs ischemic.  Day 4 IV Metronidazole.  Day 2 Octretide drip.  Loperamide via tube on hold now that NGT is out.  Stool volume improved but consistency still watery.  With bleeding noted in tubing, ? Ischemic coitis, ? Trauma/irritation from flexiseal.   * Shock liver. LFTs improved.   * Hypokalemia improved.   * Stage 4 CKD  * Thrombocytopenia.   * Coagulopathy.   * S/p PEA cardiac arrest.  Remains on pressors.   *  LL PNA.  Day 2 Levaquin  *  AMS, likely anoxic brain injury.  Has not had EEG.  Not alert enough for PO  *  (Neurogenic) dysphagia.  Will need feeding tube for oral meds and nutrition.    PLAN   *  Ideally would like to start [place], but  she has no po access and Cholestyramine is likely to bulky for Panda tube (would clog it up).  Continue Octreotide for now  *  Feeding tube placement per CCM    Azucena Freed  12/08/2014, 11:37 AM Pager: [place] Attending  I have taken a history, examined the patient and reviewed the chart. I agree with the Advanced Practitioner's note, impression and recommendations. Any additions are listed below or above in body of note.  Liquid loperamide started after nasoenteric tube placed. Could change to Lomotil liquid if available and could increase octreotide to 50uq/hr if we need to. Would not but cholestyramine down tube. Ischemic colitis very likely and bleeding often delayed but usually not large Will see how much mental status comes back?  Gatha Mayer, MD, [facility] [phone]  (pager) 12/08/2014 6:06 PM

## 2014-12-08 NOTE — Procedures (Signed)
Extubation Procedure Note  Patient Details:   Name: Tailey Washinton DOB: 11-05-1950 MRN: [id number]   Airway Documentation:     Evaluation  O2 sats: stable throughout Complications: No apparent complications Patient did tolerate procedure well. Bilateral Breath Sounds: Clear, Diminished Suctioning: Airway Yes   Patient extubated to [place]. Vital signs stable throughout. No complications. RN at bedside. RT will continue to monitor.  Mcneil Sober 12/08/2014, 10:04 AM

## 2014-12-08 NOTE — Progress Notes (Signed)
Called eLink and spoke with MD Deterding regarding ABG results, new ventilator settings received. Vicie Mutters, RN

## 2014-12-08 NOTE — Progress Notes (Signed)
PULMONARY / CRITICAL CARE MEDICINE   Name: Yesenia Lyons MRN: [id number] DOB: [birth year]    ADMISSION DATE:  11/25/2014 CONSULTATION DATE: 11/27  REFERRING MD :  EDP, DR Schlossman  CHIEF COMPLAINT:  arrest  HISTORY OF PRESENT ILLNESS:   64 yr old female, h/o carcinoid tumor liver, chronic diarhea, poor intake, coded pea home on toilet 6 min , K noted low  SUBJECTIVE: levo back up, volume given  VITAL SIGNS: BP 79/52 mmHg  Pulse 64  Temp(Src) 99 F (37.2 C) (Oral)  Resp 26  Ht 6\' 4"  (1.93 m)  Wt 142.4 kg (313 lb 15 oz)  BMI 38.23 kg/m2  SpO2 100%  HEMODYNAMICS: CVP:  [6 mmHg-11 mmHg] 9 mmHg  VENTILATOR SETTINGS: Vent Mode:  [-] CPAP;PSV FiO2 (%):  [30 %-40 %] 40 % Set Rate:  [18 bmp-22 bmp] 18 bmp Vt Set:  [600 mL-660 mL] 600 mL PEEP:  [5 cmH20] 5 cmH20 Pressure Support:  [5 cmH20] 5 cmH20 Plateau Pressure:  [27 cmH20-34 cmH20] 34 cmH20  INTAKE / OUTPUT: I/O last 3 completed shifts: In: 12071 [I.V.:10201.8; NG/GT:569.2; IV Piggyback:1300] Out: [id number] [Urine:1190; Stool:2800]  PHYSICAL EXAMINATION: General: rass 0 no distress Neuro:  Moving upper ext, rass 0, follows simple commands HEENT:  Obese,, line clean Cardiovascular:  s1 s2 RRR distant Lungs: CTA, reduced Abdomen:  Old scars, obese, Tenderness diffuse mild , ND., ro r/g Musculoskeletal:  gen 1 edema Skin:  dry  LABS:  CBC  Recent Labs Lab 12/06/14 1800 12/07/14 0630 12/08/14 0200  WBC 17.2* 17.1* 19.3*  HGB 10.7* 11.1* 11.3*  HCT 33.4* 33.6* 34.3*  PLT 70* 61* 68*   Coag's  Recent Labs Lab 12/02/2014 1437 12/06/14 0400 12/07/14 0630 12/08/14 0200  APTT 52*  --   --   --   INR 1.97* 2.83* 2.28* 2.07*   BMET  Recent Labs Lab 12/07/14 0630 12/07/14 1742 12/08/14 0200  NA 141 140 140  K 3.1* 2.9* 2.9*  CL 112* 110 106  CO2 13* 16* 15*  BUN 25* 26* 26*  CREATININE 4.73* 4.09* 4.25*  GLUCOSE 125* 173* 176*   Electrolytes  Recent Labs Lab 11/14/2014 1437  12/05/14 0600   12/06/14 0400  12/07/14 0630 12/07/14 1742 12/08/14 0200  CALCIUM 6.5*  < > 6.6*  < > 6.6*  < > 6.5* 6.2* 6.5*  MG 1.7  --  1.7  --  2.1  --   --   --   --   PHOS 6.4*  --  6.0*  --   --   --   --   --   --   < > = values in this interval not displayed. Sepsis Markers  Recent Labs Lab 12/02/2014 1437 12/03/2014 1453 11/19/2014 2305 12/05/14 0155  LATICACIDVEN 9.8* 11.73* 10.9* 9.3*  PROCALCITON 3.91  --   --   --    ABG  Recent Labs Lab 12/06/14 0320 12/07/14 0331 12/08/14 0301  PHART 7.371 7.397 7.517*  PCO2ART 19.6* 20.1* 17.6*  PO2ART 168* 356* 113*   Liver Enzymes  Recent Labs Lab 12/06/14 0400 12/07/14 0630 12/08/14 0200  AST 6723* 3375* 1751*  ALT 1580* 1209* 910*  ALKPHOS 693* 675* 676*  BILITOT 3.4* 3.5* 3.4*  ALBUMIN 1.8* 1.6* 1.6*   Cardiac Enzymes  Recent Labs Lab 11/25/2014 1437 12/05/2014 1900 12/05/14 0155  TROPONINI 1.07* 1.49* 1.95*   Glucose  Recent Labs Lab 12/07/14 0737 12/07/14 1228 12/07/14 1637 12/07/14 1953 12/07/14 2355 12/08/14 0438  GLUCAP 130*  143* 162* 139* 156* 183*    Imaging Dg Chest Port 1 View  12/08/2014  CLINICAL DATA:  Pulmonary edema. EXAM: PORTABLE CHEST 1 VIEW COMPARISON:  12/07/2014. FINDINGS: Endotracheal tube, left IJ line, NG tube noted in stable position. Cardiomegaly with normal pulmonary vascularity. Persistent dense left lower lobe infiltrate consistent with pneumonia or asymmetric pulmonary edema. Low lung volumes. No prominent pleural effusion. No pneumothorax. IMPRESSION: 1. Lines and tubes in stable position. 2. Persistent dense left lower lobe infiltrate consistent with pneumonia or asymmetric pulmonary edema. Low lung volumes. 3. Stable cardiomegaly. No pulmonary venous congestion on today's exam . Electronically Signed   By: Marcello Moores  Register   On: 12/08/2014 07:11     STUDIES:  Echo 11/27>>>rv hypokinetic, mod dilated, pa 40, LV 50%-55% CT 11/27>>>No evidence of bowel necrosis. 2. Proximal colonic  wall thickening which could be ischemic or infectious.6 cm left adrenal mass, favor metastasis over hematoma. History of carcinoid with extensive intra-abdominal adenopathy and hepatic metastatic disease.  CULTURES: BC 11/27>>> cdiff neg 11/27 O and P 11/28>>> Stool culture / enteric pathogens 11/28>>>  ANTIBIOTICS: ceftaz 11/27>>> vanc 11/27>>>11/30 Flagyll 11/27>>>  SIGNIFICANT EVENTS: 11/27- arrest 56 min , pea , unwitnessed , low K , electrolytes issue 11/28- levo down, urine output low 11/29- arf, crt rise, cvp 2, volume given  11/3- 7 liters pos!!, stool output reduced  LINES/TUBES: 11/27 left IJ>>> 11/27 ett>>> 11/27 rt fem a line>>>  ASSESSMENT / PLAN:  PULMONARY A: Acute resp failure Left lower lobe ? Mass alkalotic P:  Reduced TV / rate, repeat abg if not extubated Weaning cpap 5 ps 5, goal 1 hr DNI upon extubation planned  CARDIOVASCULAR A:  S/p arrest, r/o secondary to hypokalemia, vaso vagal and hypovolemia RV on echo (if PE) does not explain shock state) P:  Levophed to MAP 60, sys 90 cvp 3 after 7 liters, accuracy? Dc cvp Imodium, keep, may need to increase Hesitant to re add vaso with bowel ischemia concerns Repeat cortisol Octreotide inducing some of low BP  RENAL A:   ARF, unknown baseline, lasix diuretic dependent, lactic acidosis post arrest, NONAG increased, r;o dead bowel proximal colon P:  , hypokalemia, ATN Chem q8h Maintain bicarb but reduce to 50  Slow stool output- successful Reduce volume overall k supp  GASTROINTESTINAL A:   LIver cancer carcinoid Extesnive, r/o dead bowel, shock liver worsening, chronic diahrea from carcinoid, Abdo soft on examination P:   LFT in am CT reviewed, not overwhelming Massive stool volume output Npo ppi Stool studies remain, neg thus fa Consider increasing TF rate to goal if not extubated LFt resolving, repeat in am  octreotide to inhibit ser to slow stools iimodium scheduled,  increase  HEMATOLOGIC A:   DVT prevention, Doubt PE clinically significant, coagulapthy on own ( liver carcinoid), thrombocytopenia ( MODS, liver cancer) P:  Doppler neg, rv does not explain shock state scd for now Fibrinogen 408 Plat are diluting , concerning, may need to dc heparin, dc  INFECTIOUS A:  Immunocompromised host, r/o dead bowel, r;io infectious diarrhea (unloikley)  P:   11/27 vanc>>>11/30 11/27 ceftaz>>>11/30 11/27 flagyl>>> 11/27 anidulo>>>11/28 11/30 levofloxacin>>>  Stool work up Maintain current regimen  ENDOCRINE A:   No evidence AI Octreotide can increase glu P:   ssi  NEUROLOGIC A:   S/p arrest, r/o anoxia (woke up post code), high risk hepatic encpeh P:   RASS goal: 0 fent not much needed wua Avoid benzo with liver Lactulose consideration if more lethargic  FAMILY  - Updates: daughter 11/27, 28, 29, called 30th  - Inter-disciplinary family meet or Palliative Care meeting due by:  12/1  Ccm time 30 min   Family to be updated Lavon Paganini. Titus Mould, MD, [place] Pgr: [facility]

## 2014-12-08 NOTE — Progress Notes (Signed)
[facility] Progress Note Patient Name: Yesenia Lyons DOB: [birth year] MRN: [id number]   Date of Service  12/08/2014  HPI/Events of Note  Resp alkalosis on vent with pH 7.5/17.6/113/14  eICU Interventions  Plan: Decrease RR to 18 Decrease TV to 600 cc     Intervention Category Major Interventions: Acid-Base disturbance - evaluation and management  DETERDING,ELIZABETH 12/08/2014, 4:48 AM

## 2014-12-08 NOTE — Progress Notes (Signed)
Called eLink, spoke with Elta Guadeloupe RN regarding Potassium level 2.9.  Vicie Mutters, RN

## 2014-12-08 DEATH — deceased

## 2014-12-09 DIAGNOSIS — K759 Inflammatory liver disease, unspecified: Secondary | ICD-10-CM

## 2014-12-09 LAB — CULTURE, BLOOD (ROUTINE X 2): CULTURE: NO GROWTH

## 2014-12-09 LAB — COMPREHENSIVE METABOLIC PANEL
ALT: 602 U/L — ABNORMAL HIGH (ref 14–54)
ANION GAP: 15 (ref 5–15)
AST: 685 U/L — ABNORMAL HIGH (ref 15–41)
Albumin: 1.6 g/dL — ABNORMAL LOW (ref 3.5–5.0)
Alkaline Phosphatase: 646 U/L — ABNORMAL HIGH (ref 38–126)
BUN: 23 mg/dL — ABNORMAL HIGH (ref 6–20)
CALCIUM: 6.5 mg/dL — AB (ref 8.9–10.3)
CHLORIDE: 104 mmol/L (ref 101–111)
CO2: 21 mmol/L — AB (ref 22–32)
Creatinine, Ser: 3.74 mg/dL — ABNORMAL HIGH (ref 0.44–1.00)
GFR, EST AFRICAN AMERICAN: 14 mL/min — AB (ref >60)
GFR, EST NON AFRICAN AMERICAN: 12 mL/min — AB (ref >60)
Glucose, Bld: 171 mg/dL — ABNORMAL HIGH (ref 65–99)
POTASSIUM: 2.8 mmol/L — AB (ref 3.5–5.1)
SODIUM: 140 mmol/L (ref 135–145)
TOTAL PROTEIN: 5.2 g/dL — AB (ref 6.5–8.1)
Total Bilirubin: 3.2 mg/dL — ABNORMAL HIGH (ref 0.3–1.2)

## 2014-12-09 LAB — GLUCOSE, CAPILLARY
GLUCOSE-CAPILLARY: 181 mg/dL — AB (ref 65–99)
GLUCOSE-CAPILLARY: 136 mg/dL — AB (ref 65–99)
GLUCOSE-CAPILLARY: 144 mg/dL — AB (ref 65–99)
Glucose-Capillary: 127 mg/dL — ABNORMAL HIGH (ref 65–99)
Glucose-Capillary: 120 mg/dL — ABNORMAL HIGH (ref 65–99)
Glucose-Capillary: 134 mg/dL — ABNORMAL HIGH (ref 65–99)

## 2014-12-09 LAB — PROTIME-INR
INR: 2 — AB (ref 0.00–1.49)
PROTHROMBIN TIME: 22.6 s — AB (ref 11.6–15.2)

## 2014-12-09 LAB — CBC
HEMATOCRIT: 34.1 % — AB (ref 36.0–46.0)
Hemoglobin: 11.1 g/dL — ABNORMAL LOW (ref 12.0–15.0)
MCH: 25.9 pg — ABNORMAL LOW (ref 26.0–34.0)
MCHC: 32.6 g/dL (ref 30.0–36.0)
MCV: 79.7 fL (ref 78.0–100.0)
PLATELETS: 66 10*3/uL — AB (ref 150–400)
RBC: 4.28 MIL/uL (ref 3.87–5.11)
RDW: 16.8 % — AB (ref 11.5–15.5)
WBC: 19.5 10*3/uL — AB (ref 4.0–10.5)

## 2014-12-09 MED ORDER — HYDROCORTISONE NA SUCCINATE PF 100 MG IJ SOLR
50.0000 mg | Freq: Four times a day (QID) | INTRAMUSCULAR | Status: DC
  Administered 2014-12-09 – 2014-12-13 (×18): 50 mg via INTRAVENOUS
  Filled 2014-12-09 (×18): qty 2

## 2014-12-09 MED ORDER — OPIUM 10 MG/ML (1%) PO TINC: 5.0000 mg | Freq: Two times a day (BID) | ORAL | Status: DC

## 2014-12-09 MED ORDER — METRONIDAZOLE IN NACL 5-0.79 MG/ML-% IV SOLN
500.0000 mg | Freq: Three times a day (TID) | INTRAVENOUS | Status: AC
  Administered 2014-12-09 – 2014-12-11 (×8): 500 mg via INTRAVENOUS
  Filled 2014-12-09 (×8): qty 100

## 2014-12-09 MED ORDER — LEVOFLOXACIN IN D5W 500 MG/100ML IV SOLN: 500.0000 mg | INTRAVENOUS | Status: DC

## 2014-12-09 MED ORDER — VITAL HIGH PROTEIN PO LIQD: 1000.0000 mL | ORAL | Status: DC

## 2014-12-09 MED ORDER — POTASSIUM CHLORIDE 10 MEQ/50ML IV SOLN
10.0000 meq | INTRAVENOUS | Status: AC
  Administered 2014-12-09 (×4): 10 meq via INTRAVENOUS
  Filled 2014-12-09 (×4): qty 50

## 2014-12-09 MED ORDER — LEVOFLOXACIN IN D5W 500 MG/100ML IV SOLN
500.0000 mg | INTRAVENOUS | Status: AC
  Administered 2014-12-09 – 2014-12-11 (×2): 500 mg via INTRAVENOUS
  Filled 2014-12-09 (×2): qty 100

## 2014-12-09 MED ORDER — OPIUM 10 MG/ML (1%) PO TINC
5.0000 mg | Freq: Two times a day (BID) | ORAL | Status: DC
  Administered 2014-12-09 – 2014-12-13 (×9): 5 mg via ORAL
  Filled 2014-12-09 (×6): qty 1
  Filled 2014-12-09: qty 0.5
  Filled 2014-12-09 (×3): qty 1

## 2014-12-09 MED ORDER — POTASSIUM CHLORIDE CRYS ER 20 MEQ PO TBCR
40.0000 meq | EXTENDED_RELEASE_TABLET | Freq: Once | ORAL | Status: AC
  Administered 2014-12-09: 40 meq via ORAL
  Filled 2014-12-09: qty 2

## 2014-12-09 MED ORDER — VITAL AF 1.2 CAL PO LIQD
1000.0000 mL | ORAL | Status: DC
  Administered 2014-12-09 – 2014-12-10 (×2): 1000 mL
  Filled 2014-12-09 (×10): qty 1000

## 2014-12-09 NOTE — Progress Notes (Signed)
Nutrition Follow-up  DOCUMENTATION CODES:   Obesity unspecified  INTERVENTION:   Initiate Vital High Protein @ 20 ml/hr and increase by 10 ml every 8 hours to goal rate of 80 ml/hr  Provides: 2304 kcal, 144 grams protein, and 1557 ml H2O  NUTRITION DIAGNOSIS:   Inadequate oral intake related to inability to eat as evidenced by NPO status. Ongoing.   GOAL:   Patient will meet greater than or equal to 90% of their needs Not yet met.   MONITOR:   Skin, Labs, Weight trends, TF tolerance  ASSESSMENT:   65 yr old female, h/o carcinoid tumor liver, chronic diarhea, poor intake, coded pea home on toilet 6 min , K noted low   Spoke with MD, ok to resume tube feedings. Cortrak placed 12/1. Output decreased to 1 Liter 12/1, down from 2+ Liters per day.  Octreotide d/c'ed due to concerns of hypotension.  GI following - tinture of opium ordered 5 ml every 12 hours, pt remains on imodium (4 ml q 6 hours) Pt extubated 12/1 Prior to extubated pt was tolerating Vital High Protein @ 10 ml/hr Labs reviewed: potassium low (2.8) on supplementation CBG's: 162-181   Diet Order:  Diet NPO time specified  Skin:  Wound (see comment) (Cellulitis on bilateral legs and MASD on abdomen folds)  Last BM:  12/1 1000 ml via rectal pouch, on imodium  Height:   Ht Readings from Last 1 Encounters:  11/08/2014 '6\' 4"'  (1.93 m)   Weight:   Wt Readings from Last 1 Encounters:  12/09/14 320 lb 12.3 oz (145.5 kg)   Ideal Body Weight:  81.8 kg  BMI:  Body mass index is 39.06 kg/(m^2).  Estimated Nutritional Needs:   Kcal:  2100-2300  Protein:  140-160  Fluid:  > 2 L/day  EDUCATION NEEDS:   No education needs identified at this time  [place], [place], [place] Pager [phone] After Hours Pager

## 2014-12-09 NOTE — Progress Notes (Signed)
ANTIBIOTIC CONSULT NOTE - FOLLOW UP  Pharmacy Consult for levaquin Indication: GI infection   Patient Measurements: Height: 6\' 4"  (193 cm) Weight: (!) 320 lb 12.3 oz (145.5 kg) IBW/kg (Calculated) : 82.3  Vital Signs: Temp: 97.3 F (36.3 C) (12/02 1100) Temp Source: Oral (12/02 1100) Pulse Rate: 69 (12/02 1400) Intake/Output from previous day: 12/01 0701 - 12/02 0700 In: 4307.7 [I.V.:3667.7; NG/GT:90; IV Piggyback:550] Out: 1100 [Urine:990; Emesis/NG output:60; Stool:50] Intake/Output from this shift: Total I/O In: 418.1 [I.V.:218.1; IV Piggyback:200] Out: 465 [Urine:465]  Labs:  Recent Labs  12/07/14 0630 12/07/14 1742 12/08/14 0200 12/09/14 0450  WBC 17.1*  --  19.3* 19.5*  HGB 11.1*  --  11.3* 11.1*  PLT 61*  --  68* 66*  CREATININE 4.73* 4.09* 4.25* 3.74*   Estimated Creatinine Clearance: 25.5 mL/min (by C-G formula based on Cr of 3.74). No results for input(s): VANCOTROUGH, VANCOPEAK, VANCORANDOM, GENTTROUGH, GENTPEAK, GENTRANDOM, TOBRATROUGH, TOBRAPEAK, TOBRARND, AMIKACINPEAK, AMIKACINTROU, AMIKACIN in the last 72 hours.   Microbiology: Recent Results (from the past 720 hour(s))  Blood culture (routine x 2)     Status: None (Preliminary result)   Collection Time: 11/25/2014 11:45 AM  Result Value Ref Range Status   Specimen Description BLOOD RIGHT ANTECUBITAL  Final   Special Requests BOTTLES DRAWN AEROBIC AND ANAEROBIC 5CC  Final   Culture  Setup Time   Final    GRAM POSITIVE RODS AEROBIC BOTTLE ONLY CRITICAL RESULT CALLED TO, READ BACK BY AND VERIFIED WITH: S. DABBS RN 12:00 12/08/14 ([email or web address])    Culture CULTURE REINCUBATED FOR BETTER GROWTH  Final   Report Status PENDING  Incomplete  Blood culture (routine x 2)     Status: None   Collection Time: 12/06/2014  1:27 PM  Result Value Ref Range Status   Specimen Description BLOOD LEFT NECK  Final   Special Requests BOTTLES DRAWN AEROBIC AND ANAEROBIC 10CC  Final   Culture NO GROWTH 5 DAYS  Final   Report  Status 12/09/2014 FINAL  Final  MRSA PCR Screening     Status: None   Collection Time: 11/14/2014  4:50 PM  Result Value Ref Range Status   MRSA by PCR NEGATIVE NEGATIVE Final    Comment:        The GeneXpert MRSA Assay (FDA approved for NASAL specimens only), is one component of a comprehensive MRSA colonization surveillance program. It is not intended to diagnose MRSA infection nor to guide or monitor treatment for MRSA infections.   C difficile quick scan w PCR reflex     Status: None   Collection Time: 12/05/2014  9:44 PM  Result Value Ref Range Status   C Diff antigen NEGATIVE NEGATIVE Final   C Diff toxin NEGATIVE NEGATIVE Final   C Diff interpretation Negative for toxigenic C. difficile  Final  Stool culture     Status: None (Preliminary result)   Collection Time: 12/06/14  9:41 AM  Result Value Ref Range Status   Specimen Description STOOL  Final   Special Requests Immunocompromised  Final   Culture   Final    Culture reincubated for better growth Performed at [facility]    Report Status PENDING  Incomplete  OVA + PARASITE EXAM     Status: None   Collection Time: 12/06/14  9:41 AM  Result Value Ref Range Status   OVA + PARASITE EXAM Final report  Final    Comment: (NOTE) These results were obtained using wet preparation(s) and trichrome stained smear. This  test does not include testing for Cryptosporidium parvum, Cyclospora, or Microsporidia. Performed At: [place], [place] [id number] Elwanda Brooklyn R MD [id number]    Source of Sample STOOL  Final    Anti-infectives    Start     Dose/Rate Route Frequency Ordered Stop   12/11/14 0000  levofloxacin (LEVAQUIN) IVPB 500 mg  Status:  Discontinued     500 mg 100 mL/hr over 60 Minutes Intravenous Every 48 hours 12/09/14 0825 12/09/14 0922   12/09/14 1100  levofloxacin (LEVAQUIN) IVPB 500 mg  Status:  Discontinued     500 mg 100 mL/hr over 60 Minutes Intravenous Every 48  hours 12/07/14 1009 12/09/14 0825   12/09/14 1100  levofloxacin (LEVAQUIN) IVPB 500 mg     500 mg 100 mL/hr over 60 Minutes Intravenous Every 48 hours 12/09/14 0922 12/13/14 1059   12/09/14 0930  metroNIDAZOLE (FLAGYL) IVPB 500 mg     500 mg 100 mL/hr over 60 Minutes Intravenous Every 8 hours 12/09/14 0929 12/13/14 0929   12/07/14 1100  levofloxacin (LEVAQUIN) IVPB 750 mg  Status:  Discontinued     750 mg 100 mL/hr over 90 Minutes Intravenous Every 24 hours 12/07/14 1009 12/07/14 1039   12/07/14 1100  levofloxacin (LEVAQUIN) IVPB 750 mg     750 mg 100 mL/hr over 90 Minutes Intravenous Every 24 hours 12/07/14 1039 12/07/14 1318   12/06/14 1000  vancomycin (VANCOCIN) 1,750 mg in sodium chloride 0.9 % 500 mL IVPB  Status:  Discontinued     1,750 mg 250 mL/hr over 120 Minutes Intravenous Every 48 hours 11/28/2014 1338 12/07/14 1009   12/06/14 1000  anidulafungin (ERAXIS) 100 mg in sodium chloride 0.9 % 100 mL IVPB  Status:  Discontinued     100 mg over 90 Minutes Intravenous Every 24 hours 12/05/14 0859 12/06/14 0854   12/05/14 1000  anidulafungin (ERAXIS) 200 mg in sodium chloride 0.9 % 200 mL IVPB     200 mg over 180 Minutes Intravenous  Once 12/05/14 0859 12/05/14 1305   12/05/14 0900  metroNIDAZOLE (FLAGYL) IVPB 500 mg  Status:  Discontinued     500 mg 100 mL/hr over 60 Minutes Intravenous Every 8 hours 12/05/14 0859 12/09/14 0823   11/30/2014 1400  cefTAZidime (FORTAZ) 2 g in dextrose 5 % 50 mL IVPB  Status:  Discontinued     2 g 100 mL/hr over 30 Minutes Intravenous Every 24 hours 11/08/2014 1334 12/07/14 1039   11/27/2014 1345  vancomycin (VANCOCIN) 2,500 mg in sodium chloride 0.9 % 500 mL IVPB     2,500 mg 250 mL/hr over 120 Minutes Intravenous  Once 11/13/2014 1331 12/03/2014 1945   11/11/2014 1245  piperacillin-tazobactam (ZOSYN) IVPB 3.375 g  Status:  Discontinued     3.375 g 100 mL/hr over 30 Minutes Intravenous  Once 11/19/2014 1230 11/25/2014 1333   11/20/2014 1230  vancomycin (VANCOCIN)  2,500 mg in sodium chloride 0.9 % 500 mL IVPB  Status:  Discontinued     2,500 mg 250 mL/hr over 120 Minutes Intravenous  Once 12/06/2014 1228 11/18/2014 1323      Assessment: 64 yo female s/p PEA arrest with possible intra-abdominal on levaquin. WBC= 19.5, afeb, SCr= 3.74 and CrCl ~ 25. Last day of levaquin to be 12/4.   Levaquin 11/30>> (12/4) Flagyl 11/28>> Ceftaz 11/27>> 11/30 Vanc 11/27>> 11/30 Eraxis 11/28>> 11/29  Plan:  -Continue levaquin 500mg  IV q48hr (ends 12/11/14) -Will sign off, please call pharmacy with any other needs.  Thank you, Hildred Laser, Pharm D 12/09/2014 3:13 PM

## 2014-12-09 NOTE — Progress Notes (Signed)
Daily Rounding Note  12/09/2014, 8:24 AM  LOS: 5 days   SUBJECTIVE:       Stool output 1100 ml yesterday,  150 ml so far today.  C/w 2.5 and 2.4 liters on previous 2 days.  Remains off vent.   Remains on Levophed  Dr Titus Mould d/c'd octreotide re concern it is driving/contributing to hypotension, added tincture of Opium if needed.   OBJECTIVE:         Vital signs in last 24 hours:    Temp:  [97.1 F (36.2 C)-97.9 F (36.6 C)] 97.7 F (36.5 C) (12/02 0352) Pulse Rate:  [57-76] 74 (12/02 0700) Resp:  [14-29] 17 (12/02 0700) BP: (79)/(52) 79/52 mmHg (12/01 0836) SpO2:  [92 %-100 %] 98 % (12/02 0700) Arterial Line BP: (88-124)/(53-86) 102/60 mmHg (12/02 0700) FiO2 (%):  [40 %] 40 % (12/01 0837) Weight:  [145.5 kg (320 lb 12.3 oz)] 145.5 kg (320 lb 12.3 oz) (12/02 0500) Last BM Date: 12/08/14 Filed Weights   12/07/14 0500 12/08/14 0400 12/09/14 0500  Weight: 137.4 kg (302 lb 14.6 oz) 142.4 kg (313 lb 15 oz) 145.5 kg (320 lb 12.3 oz)   General: more alert, comfortable, ill, obese   Heart: RRR Chest: clear in front Abdomen: tender diffusely, large upper abdominal firm mass.   Extremities: + obesity.  UE edemea Neuro/Psych:  Follows simple commands, vocalizing softly but unable to understand what she is saying.  Moves all fingers and toes.   Intake/Output from previous day: 12/01 0701 - 12/02 0700 In: 4307.7 [I.V.:3667.7; NG/GT:90; IV Piggyback:550] Out: 1100 [Urine:990; Emesis/NG output:60; Stool:50]  Intake/Output this shift: Total I/O In: 98.4 [I.V.:98.4] Out: 150 [Urine:150]  Lab Results:  Recent Labs  12/07/14 0630 12/08/14 0200 12/09/14 0450  WBC 17.1* 19.3* 19.5*  HGB 11.1* 11.3* 11.1*  HCT 33.6* 34.3* 34.1*  PLT 61* 68* 66*   BMET  Recent Labs  12/07/14 1742 12/08/14 0200 12/09/14 0450  NA 140 140 140  K 2.9* 2.9* 2.8*  CL 110 106 104  CO2 16* 15* 21*  GLUCOSE 173* 176* 171*  BUN 26*  26* 23*  CREATININE 4.09* 4.25* 3.74*  CALCIUM 6.2* 6.5* 6.5*   LFT  Recent Labs  12/07/14 0630 12/08/14 0200 12/09/14 0450  PROT 5.0* 5.5* 5.2*  ALBUMIN 1.6* 1.6* 1.6*  AST 3375* 1751* 685*  ALT 1209* 910* 602*  ALKPHOS 675* 676* 646*  BILITOT 3.5* 3.4* 3.2*   PT/INR  Recent Labs  12/08/14 0200 12/09/14 0450  LABPROT 23.1* 22.6*  INR 2.07* 2.00*   Hepatitis Panel No results for input(s): HEPBSAG, HCVAB, HEPAIGM, HEPBIGM in the last 72 hours.  Studies/Results: Dg Chest Port 1 View  12/08/2014  CLINICAL DATA:  Pulmonary edema. EXAM: PORTABLE CHEST 1 VIEW COMPARISON:  12/07/2014. FINDINGS: Endotracheal tube, left IJ line, NG tube noted in stable position. Cardiomegaly with normal pulmonary vascularity. Persistent dense left lower lobe infiltrate consistent with pneumonia or asymmetric pulmonary edema. Low lung volumes. No prominent pleural effusion. No pneumothorax. IMPRESSION: 1. Lines and tubes in stable position. 2. Persistent dense left lower lobe infiltrate consistent with pneumonia or asymmetric pulmonary edema. Low lung volumes. 3. Stable cardiomegaly. No pulmonary venous congestion on today's exam . Electronically Signed   By: [place]   On: 12/08/2014 07:11   Dg Abd Portable 1v  12/08/2014  CLINICAL DATA:  Feeding tube placement EXAM: PORTABLE ABDOMEN - 1 VIEW COMPARISON:  None. FINDINGS: Feeding tube with the  tip coiled in the fundus of the stomach. There are multiple distended loops of small bowel. There is no evidence of pneumoperitoneum, portal venous gas or pneumatosis. There are no pathologic calcifications along the expected course of the ureters. The osseous structures are unremarkable. IMPRESSION: Feeding tube with the tip coiled in the fundus of the stomach. Electronically Signed   By: Kathreen Devoid   On: 12/08/2014 16:29   Scheduled Meds: . antiseptic oral rinse  7 mL Mouth Rinse BID  . hydrocortisone sod succinate (SOLU-CORTEF) inj  50 mg Intravenous  Q6H  . insulin aspart  0-9 Units Subcutaneous 6 times per day  . [START ON 12/11/2014] levofloxacin (LEVAQUIN) IV  500 mg Intravenous Q48H  . loperamide  4 mg Per Tube 4 times per day  . Opium  5 mg Oral Q12H  . pantoprazole (PROTONIX) IV  40 mg Intravenous QHS  . potassium chloride  10 mEq Intravenous Q1 Hr x 4  . potassium chloride  40 mEq Oral Once  . sodium chloride  10-40 mL Intracatheter Q12H   Continuous Infusions: . sodium chloride 75 mL/hr at 12/09/14 0052  . norepinephrine (LEVOPHED) Adult infusion 23 mcg/min (12/09/14 0800)  . 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 C diff negative. FOBT +. Stool O&P negative, clx pending. Stool osmolality 327 Stool clx ordered CT: thickened prox colon: infectious vs ischemic. Favor later.  Day 5 IV Metronidazole, discontinued today. Day 3 Octreotide drip, discontinued today.  QID Loperamide via tube.  WBC count rising.  Stool volume improved but consistency still watery. With bleeding noted in tubing, ? Ischemic coitis, ? Trauma/irritation from flexiseal.   * Shock liver, liver mets. LFTs continue to improve.   * Hypokalemia   * Stage 4 CKD  * Thrombocytopenia.   * Coagulopathy.  Improved.   *S/p PEA cardiac arrest. Remains on pressors.   * LL PNA. Day 3 Levaquin  * AMS, improved.  ? anoxic brain injury. Has not had EEG. Not alert enough for PO: FT in place.   * (Neurogenic) dysphagia.  FT in place.    PLAN   *  I am going to add back the IV flagyl for another 4 days. D/w Dr Titus Mould.   *  ? When to initiate TF per RD rec of 11/30?    Azucena Freed  12/09/2014, 8:24 AM Pager: [phone]     Attending physician's note   I have taken an interval history, reviewed the  chart and examined the patient. I agree with the Advanced Practitioner's note, impression and recommendations. Metastatic carcinoid. Chronic diarrhea has improved however octreotide was discontinued today. Continue loperamide, tincture of opium added today. Presumed ischemic colitis post cardiac arrest. Shock liver improving. Can try low volume tube feedings but will need to stop if abdominal pain or tenderness worsens.   Lucio Edward, MD [facility] [phone] Mon-Fri 8a-5p [phone] after 5p, weekends, holidays

## 2014-12-09 NOTE — Progress Notes (Signed)
[facility] Progress Note Patient Name: Yesenia Lyons DOB: 1950-08-05 MRN: [id number]   Date of Service  12/09/2014  HPI/Events of Note    eICU Interventions  Hypokalemia -repleted      Intervention Category Intermediate Interventions: Electrolyte abnormality - evaluation and management  Yesenia Lyons V. 12/09/2014, 6:00 AM

## 2014-12-09 NOTE — Progress Notes (Signed)
PT Cancellation Note  Patient Details Name: Yesenia Lyons MRN: [id number] DOB: 1950-10-19   Cancelled Treatment:    Reason Eval/Treat Not Completed: Patient at procedure or test/unavailable (nursing providing bath)   Pilot Prindle 12/09/2014, 3:19 PM  Pager [phone]

## 2014-12-09 NOTE — Evaluation (Signed)
Clinical/Bedside Swallow Evaluation Patient Details  Name: Yesenia Lyons MRN: [id number] Date of Birth: 1950/03/20  Today's Date: 12/09/2014 Time: SLP Start Time (ACUTE ONLY): [id number] SLP Stop Time (ACUTE ONLY): 1542 SLP Time Calculation (min) (ACUTE ONLY): 12 min  Past Medical History:  Past Medical History  Diagnosis Date  . Diabetes mellitus without complication ([place])   . Hypertension   . Carcinoid (except of appendix) ([place]) 08/2013    unknown primary, mets to liver, left adrenal, mesentery  . Morbid obesity ([place])   . SIRS (systemic inflammatory response syndrome) ([place]) 03/2013  . Iron deficiency anemia 03/2013  . Diarrhea 07/2014    ? post cholecystectomy and/or carcinoid etiology   Past Surgical History:  Past Surgical History  Procedure Laterality Date  . Knee surgery    . Abdominal hysterectomy    . Cholecystectomy  07/2014    Dr Excell Seltzer  . Percutaneous cholecystostomy  05/2014   HPI:  63 yr old female, h/o carcinoid tumor liver, chronic diarhea, poor intake, coded pea home on toilet 6 min , K noted low. ETT 11/27-12/1.   Assessment / Plan / Recommendation Clinical Impression  Pt's swallowing is currently impacted by generalized weakness and prolonged intubation. Her vocal quality is low in volume, and immediate coughing s/p ice chip trials is weak and likely ineffective at clearing possible penetrates. Recommend to maintain NPO status. SLP to f/u to determine readiness for PO diet versus need for FEES.    Aspiration Risk  Severe aspiration risk    Diet Recommendation  NPO   Medication Administration: Via alternative means    Other  Recommendations Oral Care Recommendations: Oral care QID   Follow up Recommendations   (tba)    Frequency and Duration min 2x/week  2 weeks       Prognosis Prognosis for Safe Diet Advancement: Good      Swallow Study   General HPI: 64 yr old female, h/o carcinoid tumor liver, chronic diarhea, poor intake, coded pea home on toilet 6 min  , K noted low. ETT 11/27-12/1. Type of Study: Bedside Swallow Evaluation Previous Swallow Assessment: none in chart Diet Prior to this Study: NPO;NG Tube Temperature Spikes Noted: No Respiratory Status: Nasal cannula History of Recent Intubation: Yes Length of Intubations (days): 5 days Date extubated: 12/08/14 Behavior/Cognition: Alert;Cooperative Oral Cavity Assessment: Within Functional Limits Oral Care Completed by SLP: Yes Oral Cavity - Dentition: Edentulous Self-Feeding Abilities: Needs assist Patient Positioning: Upright in bed Baseline Vocal Quality: Low vocal intensity Volitional Cough: Weak    Oral/Motor/Sensory Function     Ice Chips Ice chips: Impaired Presentation: Spoon Pharyngeal Phase Impairments: Suspected delayed Swallow;Cough - Immediate   Thin Liquid Thin Liquid: Not tested    Nectar Thick Nectar Thick Liquid: Not tested   Honey Thick Honey Thick Liquid: Not tested   Puree Puree: Not tested   Solid Solid: Not tested      Yesenia Lyons, M.A. CCC-SLP [phone]  Yesenia Lyons 12/09/2014,4:21 PM

## 2014-12-09 NOTE — Progress Notes (Signed)
PULMONARY / CRITICAL CARE MEDICINE   Name: Yesenia Lyons MRN: [id number] DOB: April 16, 1950    ADMISSION DATE:  12/07/2014 CONSULTATION DATE: 11/27  REFERRING MD :  EDP, DR Schlossman  CHIEF COMPLAINT:  arrest  HISTORY OF PRESENT ILLNESS:   64 yr old female, h/o carcinoid tumor liver, chronic diarhea, poor intake, coded pea home on toilet 6 min , K noted low  SUBJECTIVE: Levo slightly down, doing well off vent. Answers yes/no questions by nodding head.    VITAL SIGNS: BP 79/52 mmHg  Pulse 73  Temp(Src) 97.7 F (36.5 C) (Oral)  Resp 21  Ht 6\' 4"  (1.93 m)  Wt 320 lb 12.3 oz (145.5 kg)  BMI 39.06 kg/m2  SpO2 100%  HEMODYNAMICS: CVP:  [3 mmHg-5 mmHg] 5 mmHg  VENTILATOR SETTINGS: Vent Mode:  [-] CPAP;PSV FiO2 (%):  [40 %] 40 % PEEP:  [5 cmH20] 5 cmH20 Pressure Support:  [5 cmH20] 5 cmH20  INTAKE / OUTPUT: I/O last 3 completed shifts: In: 9711.5 [I.V.:8461.5; NG/GT:450; IV [id number] Out: 2065 [Urine:1455; Emesis/NG output:60; Stool:550]  PHYSICAL EXAMINATION: General: obese woman in NAD, rass 0 Neuro: alert, moving upper ext, rass 0, follows simple commands HEENT: [place]/AT, EOMI, NG tube in place, mucus membranes moist  Cardiovascular: RRR, no m/g/r Lungs: CTA bilaterally, reduced Abdomen:  BS+, old scars, obese, mild diffuse tenderness Musculoskeletal:  gen 1 edema Skin:  Dry, warm  LABS:  CBC  Recent Labs Lab 12/07/14 0630 12/08/14 0200 12/09/14 0450  WBC 17.1* 19.3* 19.5*  HGB 11.1* 11.3* 11.1*  HCT 33.6* 34.3* 34.1*  PLT 61* 68* 66*   Coag's  Recent Labs Lab 11/12/2014 1437  12/07/14 0630 12/08/14 0200 12/09/14 0450  APTT 52*  --   --   --   --   INR 1.97*  < > 2.28* 2.07* 2.00*  < > = values in this interval not displayed. BMET  Recent Labs Lab 12/07/14 1742 12/08/14 0200 12/09/14 0450  NA 140 140 140  K 2.9* 2.9* 2.8*  CL 110 106 104  CO2 16* 15* 21*  BUN 26* 26* 23*  CREATININE 4.09* 4.25* 3.74*  GLUCOSE 173* 176* 171*    Electrolytes  Recent Labs Lab 11/14/2014 1437  12/05/14 0600  12/06/14 0400  12/07/14 1742 12/08/14 0200 12/09/14 0450  CALCIUM 6.5*  < > 6.6*  < > 6.6*  < > 6.2* 6.5* 6.5*  MG 1.7  --  1.7  --  2.1  --   --   --   --   PHOS 6.4*  --  6.0*  --   --   --   --   --   --   < > = values in this interval not displayed. Sepsis Markers  Recent Labs Lab 12/03/2014 1437 12/05/2014 1453 12/07/2014 2305 12/05/14 0155  LATICACIDVEN 9.8* 11.73* 10.9* 9.3*  PROCALCITON 3.91  --   --   --    ABG  Recent Labs Lab 12/07/14 0331 12/08/14 0301 12/08/14 0952  PHART 7.397 7.517* 7.373  PCO2ART 20.1* 17.6* 32.6*  PO2ART 356* 113* 75.0*   Liver Enzymes  Recent Labs Lab 12/07/14 0630 12/08/14 0200 12/09/14 0450  AST 3375* 1751* 685*  ALT 1209* 910* 602*  ALKPHOS 675* 676* 646*  BILITOT 3.5* 3.4* 3.2*  ALBUMIN 1.6* 1.6* 1.6*   Cardiac Enzymes  Recent Labs Lab 11/10/2014 1437 11/20/2014 1900 12/05/14 0155  TROPONINI 1.07* 1.49* 1.95*   Glucose  Recent Labs Lab 12/08/14 0824 12/08/14 1131 12/08/14 1632 12/08/14 2058  12/08/14 2343 12/09/14 0331  GLUCAP 124* 202* 180* 162* 165* 181*    Imaging Dg Abd Portable 1v  12/08/2014  CLINICAL DATA:  Feeding tube placement EXAM: PORTABLE ABDOMEN - 1 VIEW COMPARISON:  None. FINDINGS: Feeding tube with the tip coiled in the fundus of the stomach. There are multiple distended loops of small bowel. There is no evidence of pneumoperitoneum, portal venous gas or pneumatosis. There are no pathologic calcifications along the expected course of the ureters. The osseous structures are unremarkable. IMPRESSION: Feeding tube with the tip coiled in the fundus of the stomach. Electronically Signed   By: Kathreen Devoid   On: 12/08/2014 16:29     STUDIES:  Echo 11/27>>>rv hypokinetic, mod dilated, pa 40, LV 50%-55% CT 11/27>>>No evidence of bowel necrosis. 2. Proximal colonic wall thickening which could be ischemic or infectious.6 cm left adrenal  mass, favor metastasis over hematoma. History of carcinoid with extensive intra-abdominal adenopathy and hepatic metastatic disease.  CULTURES: BC 11/27>>> cdiff neg 11/27 O and P 11/28>>> Stool culture / enteric pathogens 11/28>>>  ANTIBIOTICS: ceftaz 11/27>>>11/29 vanc 11/27>>>11/30 Flagyll 11/27>>>12/2 Levaquin 11/30>>>plan stop 12/4  SIGNIFICANT EVENTS: 11/27- arrest 56 min , pea , unwitnessed , low K , electrolytes issue 11/28- levo down, urine output low 11/29- arf, crt rise, cvp 2, volume given  11/3- 7 liters pos!!, stool output reduced, extubated   LINES/TUBES: 11/27 left IJ>>> 11/27 ett>>> 11/27 rt fem a line>>>  ASSESSMENT / PLAN:  PULMONARY A: Acute resp failure- improved  Left lower lobe ? 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 Imodium increased yesterday to 4 mg Q6H Hesitant to re add vaso with bowel ischemia concerns Octreotide inducing some of low BP - and output better, dc Re add stress steroids  RENAL A:   ARF, unknown baseline, lasix diuretic dependent, lactic acidosis post arrest, NONAG increased, r/o dead bowel proximal colon Hypokalemia, ATN P:   Renal function improving Dc bicarb at 50 Slow stool output- successful Reduce volume overall- positive 3 L today k supp additional  GASTROINTESTINAL A:   LIver cancer carcinoid Extensive, r/o dead bowel, shock liver worsening, chronic diarrhea from carcinoid, Abdo soft on examination P:   LFTs improving, check in am Massive stool volume output- reduced Continue Imodium 4 mg Q6H Feeding tube placed 12/1, no feeds currently PPI Stool studies remain neg thus far Octreotide to inhibit ser to slow stools - dc If needed add add  additional agent oral  HEMATOLOGIC A:   DVT prevention, Doubt PE clinically significant, coagulapthy on own ( liver carcinoid), thrombocytopenia ( MODS, liver cancer) P:  Doppler neg, rv does not explain shock state scd for now Heparin d/c'd 12/1  INFECTIOUS A:  Immunocompromised host, r/o dead bowel,r/o colitis infectious  P:   11/27 vanc>>>11/30 11/27 ceftaz>>>11/30 11/27 flagyl>>>12/2 11/27 anidulo>>>11/28 11/30 levofloxacin>>>plam 8 days  Stool work up Maintain current regimen  ENDOCRINE A:   Adrenal cancer Octreotide can increase glu P:   ssi add stress roids  NEUROLOGIC A:   S/p arrest, r/o anoxia (woke up post code), high risk hepatic enceph P:   RASS goal: 0 PT  Avoid benzo with liver Lactulose consideration if more lethargic    FAMILY  - Updates:  daughter 11/27, 28, 29, called 30th  - Inter-disciplinary family meet or Palliative Care meeting due by:  12/1  Attending note to follow  Albin Felling, MD, MPH Internal Medicine Resident, PGY-II Pager: [phone]   STAFF NOTE: Linwood Dibbles, MD FACP have personally reviewed patient's available data, including medical history, events of note, physical examination and test results as part of my evaluation. I have discussed with resident/NP and other care providers such as pharmacist, RN and RRT. In addition, I personally evaluated patient and elicited key findings of: extubated, CTA, no distress, mild abdo pain tender, remains in shock on levophed although somewhat improved and has been GROSSLY pos overstay, we have successfully slowed stools, dc octreotide and use additional tincture of opium to imodium, dc bicarb, even  Balance goals remain, pt as able, would accept reintubation if needed in future, she ahs carcinoid in adrenals, regardelss of cortisol response, add stress rodis and observe, renal fxn is better The patient is critically ill with multiple organ systems failure and requires high complexity  decision making for assessment and support, frequent evaluation and titration of therapies, application of advanced monitoring technologies and extensive interpretation of multiple databases.   Critical Care Time devoted to patient care services described in this note is30 Minutes. This time reflects time of care of this signee: Merrie Roof, MD FACP. This critical care time does not reflect procedure time, or teaching time or supervisory time of PA/NP/Med student/Med Resident etc but could involve care discussion time. Rest per NP/medical resident whose note is outlined above and that I agree with   Lavon Paganini. Titus Mould, MD, [place] Pgr: [facility] 12/09/2014 8:21 AM

## 2014-12-10 DIAGNOSIS — I9589 Other hypotension: Secondary | ICD-10-CM

## 2014-12-10 LAB — BASIC METABOLIC PANEL
Anion gap: 12 (ref 5–15)
BUN: 29 mg/dL — AB (ref 6–20)
CHLORIDE: 107 mmol/L (ref 101–111)
CO2: 20 mmol/L — ABNORMAL LOW (ref 22–32)
CREATININE: 3.41 mg/dL — AB (ref 0.44–1.00)
Calcium: 6.7 mg/dL — ABNORMAL LOW (ref 8.9–10.3)
GFR calc Af Amer: 15 mL/min — ABNORMAL LOW (ref >60)
GFR calc non Af Amer: 13 mL/min — ABNORMAL LOW (ref >60)
GLUCOSE: 154 mg/dL — AB (ref 65–99)
POTASSIUM: 3.5 mmol/L (ref 3.5–5.1)
SODIUM: 139 mmol/L (ref 135–145)

## 2014-12-10 LAB — STOOL CULTURE

## 2014-12-10 LAB — COMPREHENSIVE METABOLIC PANEL
ALBUMIN: 1.6 g/dL — AB (ref 3.5–5.0)
ALK PHOS: 680 U/L — AB (ref 38–126)
ALT: 441 U/L — AB (ref 14–54)
AST: 377 U/L — AB (ref 15–41)
Anion gap: 13 (ref 5–15)
BILIRUBIN TOTAL: 3.5 mg/dL — AB (ref 0.3–1.2)
BUN: 24 mg/dL — AB (ref 6–20)
CALCIUM: 6.7 mg/dL — AB (ref 8.9–10.3)
CO2: 22 mmol/L (ref 22–32)
CREATININE: 3.36 mg/dL — AB (ref 0.44–1.00)
Chloride: 106 mmol/L (ref 101–111)
GFR calc Af Amer: 15 mL/min — ABNORMAL LOW (ref >60)
GFR calc non Af Amer: 13 mL/min — ABNORMAL LOW (ref >60)
GLUCOSE: 135 mg/dL — AB (ref 65–99)
Potassium: 3.2 mmol/L — ABNORMAL LOW (ref 3.5–5.1)
SODIUM: 141 mmol/L (ref 135–145)
TOTAL PROTEIN: 5.2 g/dL — AB (ref 6.5–8.1)

## 2014-12-10 LAB — CULTURE, BLOOD (ROUTINE X 2)

## 2014-12-10 LAB — CBC
HEMATOCRIT: 33.3 % — AB (ref 36.0–46.0)
HEMOGLOBIN: 10.6 g/dL — AB (ref 12.0–15.0)
MCH: 25.6 pg — ABNORMAL LOW (ref 26.0–34.0)
MCHC: 31.8 g/dL (ref 30.0–36.0)
MCV: 80.4 fL (ref 78.0–100.0)
Platelets: 60 10*3/uL — ABNORMAL LOW (ref 150–400)
RBC: 4.14 MIL/uL (ref 3.87–5.11)
RDW: 17.1 % — ABNORMAL HIGH (ref 11.5–15.5)
WBC: 16.4 10*3/uL — AB (ref 4.0–10.5)

## 2014-12-10 LAB — GLUCOSE, CAPILLARY
GLUCOSE-CAPILLARY: 131 mg/dL — AB (ref 65–99)
Glucose-Capillary: 125 mg/dL — ABNORMAL HIGH (ref 65–99)
Glucose-Capillary: 110 mg/dL — ABNORMAL HIGH (ref 65–99)
Glucose-Capillary: 121 mg/dL — ABNORMAL HIGH (ref 65–99)

## 2014-12-10 MED ORDER — CHLORHEXIDINE GLUCONATE 0.12 % MT SOLN
15.0000 mL | Freq: Two times a day (BID) | OROMUCOSAL | Status: DC
  Administered 2014-12-10 – 2014-12-13 (×7): 15 mL via OROMUCOSAL
  Filled 2014-12-10 (×4): qty 15

## 2014-12-10 MED ORDER — MIDODRINE HCL 5 MG PO TABS
5.0000 mg | ORAL_TABLET | Freq: Three times a day (TID) | ORAL | Status: DC
  Administered 2014-12-10 (×2): 5 mg via ORAL
  Filled 2014-12-10 (×2): qty 1

## 2014-12-10 MED ORDER — POTASSIUM CHLORIDE 10 MEQ/50ML IV SOLN
10.0000 meq | INTRAVENOUS | Status: AC
  Administered 2014-12-10 (×3): 10 meq via INTRAVENOUS
  Filled 2014-12-10 (×4): qty 50

## 2014-12-10 MED ORDER — CETYLPYRIDINIUM CHLORIDE 0.05 % MT LIQD
7.0000 mL | Freq: Two times a day (BID) | OROMUCOSAL | Status: DC
  Administered 2014-12-10 – 2014-12-13 (×7): 7 mL via OROMUCOSAL

## 2014-12-10 MED ORDER — POTASSIUM CHLORIDE 20 MEQ/15ML (10%) PO SOLN
20.0000 meq | Freq: Once | ORAL | Status: AC
  Administered 2014-12-10: 20 meq
  Filled 2014-12-10: qty 15

## 2014-12-10 NOTE — Progress Notes (Signed)
SLP Cancellation Note  Patient Details Name: Memorie Haydon MRN: [id number] DOB: [birth year]   Cancelled treatment:       Reason Eval/Treat Not Completed: Patient declined, no reason specified. ST to continue efforts, RN to page if pt agreeable to PO trials later this date  Arvil Chaco MA, [place], [place] 12/10/2014, 10:35 AM

## 2014-12-10 NOTE — Evaluation (Signed)
Physical Therapy Evaluation Patient Details Name: Yesenia Lyons MRN: [id number] DOB: 03/20/1950 Today's Date: 12/10/2014   History of Present Illness  64 yr old female, h/o carcinoid tumor liver, chronic diarhea, poor intake, coded pea home on toilet 6 min , K noted low. ETT 11/27-12/1.   Clinical Impression  Pt admitted with above diagnosis. Pt currently with functional limitations due to the deficits listed below (see PT Problem List). Pt was able to sit EOB with PT with total assist to get to EOB and min guard assist to sit EOB x 10 minutes.  Pt needs extensive assist to mobilize.  Unable to stand with significant weakness noted.  Will need SNF at d/c to progress to a level that daughter can assist her.  Pt will benefit from skilled PT to increase their independence and safety with mobility to allow discharge to the venue listed below.    Follow Up Recommendations SNF;Supervision/Assistance - 24 hour    Equipment Recommendations  Other (comment) (TBA)    Recommendations for Other Services       Precautions / Restrictions Precautions Precautions: Fall Restrictions Weight Bearing Restrictions: No      Mobility  Bed Mobility Overal bed mobility: Needs Assistance;+2 for physical assistance Bed Mobility: Supine to Sit     Supine to sit: +2 for physical assistance;+2 for safety/equipment;HOB elevated;Total assist     General bed mobility comments: Pt needed total assist to roll left and right as well as total assist to get to EOB with very little initiation of movement.  Used pad to assist to EOB.    Transfers                 General transfer comment: unable  Ambulation/Gait                Stairs            Wheelchair Mobility    Modified Rankin (Stroke Patients Only)       Balance Overall balance assessment: Needs assistance Sitting-balance support: Bilateral upper extremity supported;Feet supported Sitting balance-Leahy Scale: Poor Sitting balance  - Comments: Initially, pt needed total assist to sit EOB.  However once pt cued and assisted to not push with her right UE, pt was able to sit EOB with min guard assist.  Pt sat 10 minutes total.                                       Pertinent Vitals/Pain Pain Assessment: Faces Faces Pain Scale: Hurts little more Pain Location: all over Pain Descriptors / Indicators: Aching;Sore;Grimacing Pain Intervention(s): Limited activity within patient's tolerance;Monitored during session;Repositioned  BP initially 89/69 with HR 80 bpm and 99% O2 on 2LO2.  BP withsittting EOB 87/56.  BP once back in supine 94/77.  Pt asymptomatic with low BP.      Home Living Family/patient expects to be discharged to:: Private residence Living Arrangements: Children Available Help at Discharge: Family;Available 24 hours/day (Daughter and grandson) Type of Home: Apartment Home Access: Level entry     Home Layout: One level Home Equipment: Tub bench;Bedside commode;Walker - 4 wheels      Prior Function Level of Independence: Independent with assistive device(s)         Comments: used rollator PTA, daughter assisted pt with bath     Hand Dominance        Extremity/Trunk Assessment   Upper Extremity Assessment:  Defer to OT evaluation           Lower Extremity Assessment: RLE deficits/detail;LLE deficits/detail RLE Deficits / Details: grossly 2/5 LLE Deficits / Details: grossly 2/5  Cervical / Trunk Assessment: Kyphotic  Communication   Communication: No difficulties  Cognition Arousal/Alertness: Awake/alert Behavior During Therapy: Flat affect Overall Cognitive Status: Impaired/Different from baseline Area of Impairment: Orientation;Following commands;Safety/judgement;Problem solving;Memory Orientation Level: Place;Time;Situation   Memory: Decreased short-term memory Following Commands: Follows one step commands with increased time;Follows one step commands  inconsistently Safety/Judgement: Decreased awareness of deficits;Decreased awareness of safety   Problem Solving: Difficulty sequencing;Requires verbal cues;Decreased initiation;Slow processing;Requires tactile cues General Comments: Delayed response time.    General Comments General comments (skin integrity, edema, etc.): Pt c/o buttock pain sitting EOB with bed deflated.     Exercises General Exercises - Lower Extremity Ankle Circles/Pumps: AROM;Both;5 reps;Supine Heel Slides: Both;5 reps;AAROM;Supine      Assessment/Plan    PT Assessment Patient needs continued PT services  PT Diagnosis Generalized weakness   PT Problem List Decreased activity tolerance;Decreased balance;Decreased strength;Decreased mobility;Decreased knowledge of use of DME;Decreased safety awareness;Decreased knowledge of precautions;Pain  PT Treatment Interventions DME instruction;Functional mobility training;Therapeutic activities;Therapeutic exercise;Balance training;Patient/family education   PT Goals (Current goals can be found in the Care Plan section) Acute Rehab PT Goals Patient Stated Goal: to get better PT Goal Formulation: With patient Time For Goal Achievement: 12/24/14 Potential to Achieve Goals: Good    Frequency Min 3X/week   Barriers to discharge        Co-evaluation               End of Session Equipment Utilized During Treatment: Gait belt;Oxygen Activity Tolerance: Patient limited by fatigue;Patient limited by lethargy Patient left: in bed;with call bell/phone within reach;with bed alarm set;with nursing/sitter in room;with SCD's reapplied Nurse Communication: Mobility status;Need for lift equipment         Time: 0812-0840 PT Time Calculation (min) (ACUTE ONLY): 28 min   Charges:   PT Evaluation $Initial PT Evaluation Tier I: 1 Procedure PT Treatments $Therapeutic Activity: 8-22 mins   PT G CodesDenice Paradise 2014/12/29, 11:12 AM Bernie Ransford,PT Acute  Rehabilitation [phone] (pager)

## 2014-12-10 NOTE — Progress Notes (Signed)
PULMONARY / CRITICAL CARE MEDICINE   Name: Yesenia Lyons MRN: [id number] DOB: 1950-03-17    ADMISSION DATE:  12/07/2014 CONSULTATION DATE: 11/27  REFERRING MD :  EDP, DR Schlossman  CHIEF COMPLAINT:  Cardiac Arrest   BRIEF SUMMARY:   64 yr old female, h/o carcinoid tumor liver, chronic diarhea, poor intake, PEA arrest at home on toilet with 6 min CPR, K noted low  SUBJECTIVE: Off levophed - MAP running in 60's.  Awake, alert, denies pain.  VITAL SIGNS: BP 76/47 mmHg  Pulse 78  Temp(Src) 97 F (36.1 C) (Oral)  Resp 19  Ht 6\' 4"  (1.93 m)  Wt 325 lb 6.4 oz (147.6 kg)  BMI 39.63 kg/m2  SpO2 96%  HEMODYNAMICS: CVP:  [2 mmHg-4 mmHg] 4 mmHg  VENTILATOR SETTINGS:    INTAKE / OUTPUT: I/O last 3 completed shifts: In: 42 [I.V.:4098.9; NG/GT:764.2; IV Piggyback:550] Out: 2030 [Urine:1775; Stool:255]  PHYSICAL EXAMINATION: General: obese woman in NAD, appears deconditioned Neuro: alert, moving upper ext, soft voice / weak but clear  HEENT: [place]/AT, EOMI, NG tube in place, mucus membranes moist  Cardiovascular: RRR, no m/g/r Lungs: CTA bilaterally, reduced Abdomen:  BS+, old scars, obese, mild diffuse tenderness Musculoskeletal:  Generalized edema, 1+ Skin:  Dry, warm  LABS:  CBC  Recent Labs Lab 12/08/14 0200 12/09/14 0450 12/10/14 0447  WBC 19.3* 19.5* 16.4*  HGB 11.3* 11.1* 10.6*  HCT 34.3* 34.1* 33.3*  PLT 68* 66* 60*   Coag's  Recent Labs Lab 11/24/2014 1437  12/07/14 0630 12/08/14 0200 12/09/14 0450  APTT 52*  --   --   --   --   INR 1.97*  < > 2.28* 2.07* 2.00*  < > = values in this interval not displayed.   BMET  Recent Labs Lab 12/08/14 0200 12/09/14 0450 12/10/14 0447  NA 140 140 141  K 2.9* 2.8* 3.2*  CL 106 104 106  CO2 15* 21* 22  BUN 26* 23* 24*  CREATININE 4.25* 3.74* 3.36*  GLUCOSE 176* 171* 135*   Electrolytes  Recent Labs Lab 12/07/2014 1437  12/05/14 0600  12/06/14 0400  12/08/14 0200 12/09/14 0450 12/10/14 0447   CALCIUM 6.5*  < > 6.6*  < > 6.6*  < > 6.5* 6.5* 6.7*  MG 1.7  --  1.7  --  2.1  --   --   --   --   PHOS 6.4*  --  6.0*  --   --   --   --   --   --   < > = values in this interval not displayed.   Sepsis Markers  Recent Labs Lab 12/03/2014 1437 11/30/2014 1453 12/03/2014 2305 12/05/14 0155  LATICACIDVEN 9.8* 11.73* 10.9* 9.3*  PROCALCITON 3.91  --   --   --    ABG  Recent Labs Lab 12/07/14 0331 12/08/14 0301 12/08/14 0952  PHART 7.397 7.517* 7.373  PCO2ART 20.1* 17.6* 32.6*  PO2ART 356* 113* 75.0*   Liver Enzymes  Recent Labs Lab 12/08/14 0200 12/09/14 0450 12/10/14 0447  AST 1751* 685* 377*  ALT 910* 602* 441*  ALKPHOS 676* 646* 680*  BILITOT 3.4* 3.2* 3.5*  ALBUMIN 1.6* 1.6* 1.6*   Cardiac Enzymes  Recent Labs Lab 11/25/2014 1437 11/09/2014 1900 12/05/14 0155  TROPONINI 1.07* 1.49* 1.95*   Glucose  Recent Labs Lab 12/09/14 1321 12/09/14 1740 12/09/14 2031 12/09/14 2329 12/10/14 0411 12/10/14 0811  GLUCAP 136* 120* 134* 144* 125* 110*    Imaging No results found.  STUDIES:  Echo 11/27 >> rv hypokinetic, mod dilated, pa 40, LV 50%-55% CT 11/28 >> No evidence of bowel necrosis. Proximal colonic wall thickening which could be ischemic or infectious .6 cm left adrenal mass, favor metastasis over hematoma. History of carcinoid with extensive intra-abdominal adenopathy & hepatic metastatic disease.  CULTURES: [facility] 11/27 >>  cdiff neg 11/27 >> negative  O and P 11/28 >> Stool culture / enteric pathogens 11/28 >>  ANTIBIOTICS: ceftaz 11/27 >> 11/29 vanc 11/27 >> 11/30 Flagyll 11/27 >> 12/2 Levaquin 11/30 >> plan stop 12/4  SIGNIFICANT EVENTS: 11/27 - arrest 56 min , pea , unwitnessed , low K , electrolytes issue 11/28 - levo down, urine output low 11/29 - arf, crt rise, cvp 2, volume given  12/01 - 7 liters pos!!, stool output reduced, extubated    LINES/TUBES: 11/27 left IJ >>  11/27 ett >> 12/01 11/27 rt fem a line >> out  ASSESSMENT /  PLAN:  PULMONARY A: Acute resp failure - in setting of PEA arrest.  Extubated 12/2 Left lower lobe ? 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 P:  Levophed to MAP 55, sys 90 with good MS Imodium 4 mg Q6H Hesitant to re add vaso with bowel ischemia concerns Stress steroids  Add midodrine 5mg  TID 12/3   RENAL A:   ARF -  unknown baseline, diuretic dependent  Lactic acidosis - post arrest Non-AG  Hypokalemia ATN P:   Renal function improving Slow stool output - improved Reduce volume overall - positive 3 L today Replace electrolytes as indicated   GASTROINTESTINAL A:   Liver cancer carcinoid - extensive, bowel necrosis ruled out Shock Liver Chronic diarrhea from carcinoid P:   Trend LFT's Reduce stool output, continue Imodium 4 mg Q6H + opiurm 5mg  Q12 Feeding tube placed 12/1, TF per nutrition  PPI  HEMATOLOGIC A:   DVT prevention - doubt PE clinically significant Coagulapthy - in setting of liver carcinoid Thrombocytopenia (MODS, liver cancer) - heparin d/c'd 12/1 P:  Doppler neg, RV does not explain shock state SCD's for DVT prophylaxis   INFECTIOUS A:   Immunocompromised host R/O Infectious Colitis   P:   ABX / cultures as above Follow stool work up Maintain current regimen  ENDOCRINE A:   Adrenal cancer P:   SSI Stress dose steroids as above  NEUROLOGIC A:   S/p arrest, r/o anoxia (woke up post code) High risk hepatic encephalopathy P:   RASS goal: 0 PT  Avoid benzo with liver Lactulose consideration if more lethargic    FAMILY  - Updates: No family at bedside 12/3 am.   - Inter-disciplinary family meet or Palliative Care meeting due by:  12/1   Noe Gens, NP-C [facility] Pgr: [phone] or if no  answer [phone] 12/10/2014, 2:06 PM

## 2014-12-11 ENCOUNTER — Inpatient Hospital Stay (HOSPITAL_COMMUNITY): Payer: Medicare Other

## 2014-12-11 DIAGNOSIS — E872 Acidosis, unspecified: Secondary | ICD-10-CM | POA: Insufficient documentation

## 2014-12-11 LAB — POCT I-STAT 3, ART BLOOD GAS (G3+)
ACID-BASE DEFICIT: 8 mmol/L — AB (ref 0.0–2.0)
Bicarbonate: 17 mEq/L — ABNORMAL LOW (ref 20.0–24.0)
O2 Saturation: 89 %
PH ART: 7.35 (ref 7.350–7.450)
PO2 ART: 57 mmHg — AB (ref 80.0–100.0)
Patient temperature: 97.4
TCO2: 18 mmol/L (ref 0–100)
pCO2 arterial: 30.6 mmHg — ABNORMAL LOW (ref 35.0–45.0)

## 2014-12-11 LAB — CBC WITH DIFFERENTIAL/PLATELET
BAND NEUTROPHILS: 10 %
BASOS PCT: 1 %
Basophils Absolute: 0.2 10*3/uL — ABNORMAL HIGH (ref 0.0–0.1)
Blasts: 0 %
EOS ABS: 0.2 10*3/uL (ref 0.0–0.7)
Eosinophils Relative: 1 %
HCT: 32 % — ABNORMAL LOW (ref 36.0–46.0)
HEMOGLOBIN: 10.1 g/dL — AB (ref 12.0–15.0)
LYMPHS PCT: 16 %
Lymphs Abs: 3.6 10*3/uL (ref 0.7–4.0)
MCH: 25.6 pg — AB (ref 26.0–34.0)
MCHC: 31.6 g/dL (ref 30.0–36.0)
MCV: 81.2 fL (ref 78.0–100.0)
MONO ABS: 1.8 10*3/uL — AB (ref 0.1–1.0)
MONOS PCT: 8 %
Metamyelocytes Relative: 3 %
Myelocytes: 1 %
NEUTROS ABS: 16.9 10*3/uL — AB (ref 1.7–7.7)
Neutrophils Relative %: 60 %
OTHER: 0 %
PLATELETS: 73 10*3/uL — AB (ref 150–400)
Promyelocytes Absolute: 0 %
RBC: 3.94 MIL/uL (ref 3.87–5.11)
RDW: 17.4 % — ABNORMAL HIGH (ref 11.5–15.5)
WBC: 22.7 10*3/uL — AB (ref 4.0–10.5)
nRBC: 0 /100 WBC

## 2014-12-11 LAB — AMMONIA: Ammonia: 76 umol/L — ABNORMAL HIGH (ref 9–35)

## 2014-12-11 LAB — GLUCOSE, CAPILLARY
GLUCOSE-CAPILLARY: 157 mg/dL — AB (ref 65–99)
GLUCOSE-CAPILLARY: 189 mg/dL — AB (ref 65–99)
GLUCOSE-CAPILLARY: 194 mg/dL — AB (ref 65–99)
Glucose-Capillary: 167 mg/dL — ABNORMAL HIGH (ref 65–99)
Glucose-Capillary: 155 mg/dL — ABNORMAL HIGH (ref 65–99)
Glucose-Capillary: 219 mg/dL — ABNORMAL HIGH (ref 65–99)
Glucose-Capillary: 222 mg/dL — ABNORMAL HIGH (ref 65–99)

## 2014-12-11 LAB — COMPREHENSIVE METABOLIC PANEL
ALT: 320 U/L — AB (ref 14–54)
AST: 220 U/L — ABNORMAL HIGH (ref 15–41)
Albumin: 1.7 g/dL — ABNORMAL LOW (ref 3.5–5.0)
Alkaline Phosphatase: 687 U/L — ABNORMAL HIGH (ref 38–126)
Anion gap: 11 (ref 5–15)
BUN: 31 mg/dL — AB (ref 6–20)
CALCIUM: 7 mg/dL — AB (ref 8.9–10.3)
CO2: 19 mmol/L — ABNORMAL LOW (ref 22–32)
Chloride: 109 mmol/L (ref 101–111)
Creatinine, Ser: 3.48 mg/dL — ABNORMAL HIGH (ref 0.44–1.00)
GFR calc non Af Amer: 13 mL/min — ABNORMAL LOW (ref >60)
GFR, EST AFRICAN AMERICAN: 15 mL/min — AB (ref >60)
GLUCOSE: 177 mg/dL — AB (ref 65–99)
Potassium: 3.2 mmol/L — ABNORMAL LOW (ref 3.5–5.1)
SODIUM: 139 mmol/L (ref 135–145)
TOTAL PROTEIN: 5.5 g/dL — AB (ref 6.5–8.1)
Total Bilirubin: 3.9 mg/dL — ABNORMAL HIGH (ref 0.3–1.2)

## 2014-12-11 LAB — MAGNESIUM: MAGNESIUM: 1.4 mg/dL — AB (ref 1.7–2.4)

## 2014-12-11 LAB — PHOSPHORUS: Phosphorus: 4.3 mg/dL (ref 2.5–4.6)

## 2014-12-11 MED ORDER — MAGNESIUM SULFATE 2 GM/50ML IV SOLN
2.0000 g | Freq: Once | INTRAVENOUS | Status: AC
  Administered 2014-12-11: 2 g via INTRAVENOUS
  Filled 2014-12-11: qty 50

## 2014-12-11 MED ORDER — LACTULOSE 10 GM/15ML PO SOLN
20.0000 g | Freq: Two times a day (BID) | ORAL | Status: DC
  Administered 2014-12-11 – 2014-12-12 (×3): 20 g
  Filled 2014-12-11 (×3): qty 30

## 2014-12-11 MED ORDER — MIDODRINE HCL 5 MG PO TABS
10.0000 mg | ORAL_TABLET | Freq: Three times a day (TID) | ORAL | Status: DC
  Administered 2014-12-11 – 2014-12-13 (×8): 10 mg via ORAL
  Filled 2014-12-11 (×8): qty 2

## 2014-12-11 MED ORDER — POTASSIUM CHLORIDE 20 MEQ/15ML (10%) PO SOLN
ORAL | Status: AC
  Filled 2014-12-11: qty 15

## 2014-12-11 MED ORDER — POTASSIUM CHLORIDE 20 MEQ/15ML (10%) PO SOLN
20.0000 meq | Freq: Once | ORAL | Status: AC
  Administered 2014-12-11: 20 meq
  Filled 2014-12-11: qty 15

## 2014-12-11 MED ORDER — ALBUTEROL SULFATE (2.5 MG/3ML) 0.083% IN NEBU: 2.5000 mg | INHALATION_SOLUTION | RESPIRATORY_TRACT | Status: DC | PRN

## 2014-12-11 MED ORDER — SODIUM BICARBONATE 8.4 % IV SOLN
INTRAVENOUS | Status: DC
  Administered 2014-12-11 – 2014-12-13 (×2): via INTRAVENOUS
  Filled 2014-12-11 (×4): qty 150

## 2014-12-11 NOTE — Progress Notes (Signed)
PULMONARY / CRITICAL CARE MEDICINE   Name: Yesenia Lyons MRN: [id number] DOB: 06/28/1950    ADMISSION DATE:  11/25/2014 CONSULTATION DATE: 11/27  REFERRING MD :  EDP, DR Billy Fischer  CHIEF COMPLAINT:  Cardiac Arrest   BRIEF SUMMARY:   64 yr old female, h/o carcinoid tumor liver, chronic diarhea, poor intake, PEA arrest at home on toilet with 6 min CPR, hypokalemia on admit.    SUBJECTIVE: RN reports pt less alert today.  Back on levophed at 1 mcg.  BP dropped into the 0000000 systolic overnight.  WBC increased, hypokalemia.  UOP decreased - despite positive balance.   VITAL SIGNS: BP 97/83 mmHg  Pulse 83  Temp(Src) 96.7 F (35.9 C) (Axillary)  Resp 19  Ht 6\' 4"  (1.93 m)  Wt 330 lb 0.5 oz (149.7 kg)  BMI 40.19 kg/m2  SpO2 100%  HEMODYNAMICS: CVP:  [4 mmHg] 4 mmHg  VENTILATOR SETTINGS:    INTAKE / OUTPUT: I/O last 3 completed shifts: In: 5084.4 [I.V.:2824.6; NG/GT:1709.8; IV Piggyback:550] Out: [id number] [Urine:665; Stool:205]  PHYSICAL EXAMINATION: General: obese woman lying in bed, NAD Neuro: eyes open, slow to make eye contact, slow to respond with answers HEENT: [place]/AT, EOMI, NG tube in place, mucus membranes moist / pink Cardiovascular: RRR, no m/g/r, distant tones Lungs: non-labored, wheezing on L, clear on R Abdomen:  BS+, old scars, obese, mild diffuse tenderness Musculoskeletal:  Generalized edema, 1+ Skin:  Dry, warm.  No rashes or lesions.   LABS:  CBC  Recent Labs Lab 12/09/14 0450 12/10/14 0447 12/11/14 0530  WBC 19.5* 16.4* 22.7*  HGB 11.1* 10.6* 10.1*  HCT 34.1* 33.3* 32.0*  PLT 66* 60* 73*   Coag's  Recent Labs Lab 11/10/2014 1437  12/07/14 0630 12/08/14 0200 12/09/14 0450  APTT 52*  --   --   --   --   INR 1.97*  < > 2.28* 2.07* 2.00*  < > = values in this interval not displayed.   BMET  Recent Labs Lab 12/10/14 0447 12/10/14 1541 12/11/14 0530  NA 141 139 139  K 3.2* 3.5 3.2*  CL 106 107 109  CO2 22 20* 19*  BUN 24* 29* 31*   CREATININE 3.36* 3.41* 3.48*  GLUCOSE 135* 154* 177*   Electrolytes  Recent Labs Lab 11/16/2014 1437  12/05/14 0600  12/06/14 0400  12/10/14 0447 12/10/14 1541 12/11/14 0530  CALCIUM 6.5*  < > 6.6*  < > 6.6*  < > 6.7* 6.7* 7.0*  MG 1.7  --  1.7  --  2.1  --   --   --  1.4*  PHOS 6.4*  --  6.0*  --   --   --   --   --  4.3  < > = values in this interval not displayed.   Sepsis Markers  Recent Labs Lab 11/08/2014 1437 11/14/2014 1453 11/18/2014 2305 12/05/14 0155  LATICACIDVEN 9.8* 11.73* 10.9* 9.3*  PROCALCITON 3.91  --   --   --    ABG  Recent Labs Lab 12/07/14 0331 12/08/14 0301 12/08/14 0952  PHART 7.397 7.517* 7.373  PCO2ART 20.1* 17.6* 32.6*  PO2ART 356* 113* 75.0*   Liver Enzymes  Recent Labs Lab 12/09/14 0450 12/10/14 0447 12/11/14 0530  AST 685* 377* 220*  ALT 602* 441* 320*  ALKPHOS 646* 680* 687*  BILITOT 3.2* 3.5* 3.9*  ALBUMIN 1.6* 1.6* 1.7*   Cardiac Enzymes  Recent Labs Lab 12/06/2014 1437 11/22/2014 1900 12/05/14 0155  TROPONINI 1.07* 1.49* 1.95*   Glucose  Recent Labs Lab 12/10/14 0811 12/10/14 1213 12/10/14 1550 12/10/14 2034 12/11/14 0052 12/11/14 0414  GLUCAP 110* 121* 131* 167* 157* 155*    Imaging Dg Chest Port 1 View  12/11/2014  CLINICAL DATA:  Acute respiratory failure. EXAM: PORTABLE CHEST 1 VIEW COMPARISON:  12/07/2014 and 12/08/2014. FINDINGS: 0509 hours. Endotracheal tube no longer visualized. The thoracic inlet is barely included on this image. Feeding tube projects into the gastric fundus. Nasogastric tube has been removed. Left IJ central venous catheter is unchanged at the mid SVC level. There are persistent low lung volumes with unchanged left-greater-than-right basilar airspace opacities. No pneumothorax or significant pleural effusion is seen. The heart size and mediastinal contours are stable. IMPRESSION: Persistent low lung volumes with unchanged left-greater-than-right basilar airspace opacities. Support system as  above. Electronically Signed   By: Richardean Sale M.D.   On: 12/11/2014 07:17     STUDIES:  Echo 11/27 >> rv hypokinetic, mod dilated, pa 40, LV 50%-55% CT 11/28 >> No evidence of bowel necrosis. Proximal colonic wall thickening which could be ischemic or infectious .6 cm left adrenal mass, favor metastasis over hematoma. History of carcinoid with extensive intra-abdominal adenopathy & hepatic metastatic disease.  CULTURES: [facility] 11/27 >> neg  cdiff neg 11/27 >> negative  O and P 11/28 >> neg Stool culture / enteric pathogens 11/28 >> neg  ANTIBIOTICS: ceftaz 11/27 >> 11/29 vanc 11/27 >> 11/30 Flagyl 11/27 >> 12/4 Levaquin 11/30 >> 12/4  SIGNIFICANT EVENTS: 11/27 - arrest 56 min , pea , unwitnessed , low K , electrolytes issue 11/28 - levo down, urine output low 11/29 - arf, crt rise, cvp 2, volume given  12/01 - 7 liters pos!!, stool output reduced, extubated  12/03 - Off levo briefly, midodrine initiated  12/04 - Decreased mental status  LINES/TUBES: 11/27  left IJ >>  11/27  ett >> 12/01 11/27  rt fem a line >> out   ASSESSMENT / PLAN:  PULMONARY A: Acute resp failure - in setting of PEA arrest.  Extubated 12/2.  Low lung volumes, L>R airspace disease.   Left lower lobe ? Mass P:  Pulmonary hygiene:  IS, mobilize  PT efforts  Intermittent CXR  Consider chest CT at some point to evaluate LLL Assess ABG with worsening CO2 on BMP & mental status change PRN albuterol for wheezing - ? 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  Worsening renal failure 12/4. NG Metabolic  Acidosis - suspect diarrhea contributing factor, AG 11  Lactic acidosis - post arrest Non-AG  Hypokalemia ATN P:   Trend BMP / UOP  Slow stool output - improved Replace electrolytes as indicated  Add bicarb gtt 12/4 for NGMA  GASTROINTESTINAL A:   Liver cancer carcinoid - extensive, bowel necrosis ruled out Shock Liver Chronic diarrhea from carcinoid P:   Trend LFT's Reduce stool output, continue Imodium 4 mg Q6H + opium 5mg  Q12 Feeding tube placed 12/1, TF per nutrition  PPI  HEMATOLOGIC A:   DVT prevention - doubt PE clinically significant Coagulapthy - in setting of liver carcinoid Thrombocytopenia (  MODS, liver cancer) - heparin d/c'd 12/1 P:  Doppler neg, RV does not explain shock state SCD's for DVT prophylaxis   INFECTIOUS A:   Leukocytosis - rising WBC 12/4 Immunocompromised host R/O Infectious Colitis  P:   ABX / cultures as above Stool cultures negative  Monitor fever curve / WBC  ENDOCRINE A:   Adrenal cancer P:   SSI Stress dose steroids as above  NEUROLOGIC A:   S/p arrest, r/o anoxia (woke up post code) High risk hepatic encephalopathy P:   RASS goal: 0 PT  Avoid benzo with liver Lactulose 20 gm BID with mental status change 12/4    FAMILY  - Updates: No family at bedside 12/4 am. Concerned that she is declining despite aggressive interventions.  Will need to have further discussions with family.    - Inter-disciplinary family meet or Palliative Care meeting due by:  Consider Palliative Care consult.     Noe Gens, NP-C [facility] Pgr: [phone] or if no answer [phone] 12/11/2014, 7:35 AM

## 2014-12-11 NOTE — Progress Notes (Signed)
[facility] Progress Note    Since last GI note: She is extubated, getting TFs near goal rate.  Loose pinkish watery stool output that is not particularly high.  Objective: Vital signs in last 24 hours: Temp:  [96.2 F (35.7 C)-97.5 F (36.4 C)] 96.7 F (35.9 C) (12/04 0400) Pulse Rate:  [28-83] 83 (12/04 0600) Resp:  [9-33] 19 (12/04 0600) BP: (72-109)/(36-88) 97/83 mmHg (12/04 0600) SpO2:  [50 %-100 %] 100 % (12/04 0600) Weight:  [330 lb 0.5 oz (149.7 kg)] 330 lb 0.5 oz (149.7 kg) (12/04 0200) Last BM Date: 12/09/14 General: alert and oriented times 0 to me this AM Heart: regular rate and rythm Abdomen: soft, non-tender, non-distended, normal bowel sounds   Lab Results:  Recent Labs  12/09/14 0450 12/10/14 0447 12/11/14 0530  WBC 19.5* 16.4* 22.7*  HGB 11.1* 10.6* 10.1*  PLT 66* 60* 73*  MCV 79.7 80.4 81.2    Recent Labs  12/10/14 0447 12/10/14 1541 12/11/14 0530  NA 141 139 139  K 3.2* 3.5 3.2*  CL 106 107 109  CO2 22 20* 19*  GLUCOSE 135* 154* 177*  BUN 24* 29* 31*  CREATININE 3.36* 3.41* 3.48*  CALCIUM 6.7* 6.7* 7.0*    Recent Labs  12/09/14 0450 12/10/14 0447 12/11/14 0530  PROT 5.2* 5.2* 5.5*  ALBUMIN 1.6* 1.6* 1.7*  AST 685* 377* 220*  ALT 602* 441* 320*  ALKPHOS 646* 680* 687*  BILITOT 3.2* 3.5* 3.9*    Recent Labs  12/09/14 0450  INR 2.00*    Medications: Scheduled Meds: . antiseptic oral rinse  7 mL Mouth Rinse q12n4p  . chlorhexidine  15 mL Mouth Rinse BID  . hydrocortisone sod succinate (SOLU-CORTEF) inj  50 mg Intravenous Q6H  . insulin aspart  0-9 Units Subcutaneous 6 times per day  . levofloxacin (LEVAQUIN) IV  500 mg Intravenous Q48H  . loperamide  4 mg Per Tube 4 times per day  . metronidazole  500 mg Intravenous Q8H  . midodrine  5 mg Oral TID WC  . Opium  5 mg Oral Q12H  . pantoprazole (PROTONIX) IV  40 mg Intravenous QHS  . sodium chloride  10-40 mL Intracatheter Q12H   Continuous Infusions: .  sodium chloride 75 mL/hr at 12/11/14 0627  . feeding supplement (VITAL AF 1.2 CAL) 1,000 mL (12/11/14 0200)  . norepinephrine (LEVOPHED) Adult infusion 2 mcg/min (12/11/14 0449)   PRN Meds:.sodium chloride, fentaNYL (SUBLIMAZE) injection, ondansetron (ZOFRAN) IV, sodium chloride    Assessment/Plan: 64 y.o. 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  Transaminases continue to improve in expected fashion after recovering from ischemic hepatitis. Bili remains about the same, this may be her near her baseline (admitting Tbili 2.1) with large tumor burden in her liver.  From GI perspective would continue TFs.  She needs further recovery from a mental status perspective.     Milus Banister, MD  12/11/2014, 7:14 AM [facility] Pager [phone]

## 2014-12-12 ENCOUNTER — Ambulatory Visit (HOSPITAL_COMMUNITY): Admission: RE | Admit: 2014-12-12 | Payer: Medicare Other | Source: Ambulatory Visit

## 2014-12-12 ENCOUNTER — Other Ambulatory Visit: Payer: Medicare Other

## 2014-12-12 ENCOUNTER — Telehealth: Payer: Self-pay | Admitting: *Deleted

## 2014-12-12 ENCOUNTER — Inpatient Hospital Stay (HOSPITAL_COMMUNITY): Payer: Medicare Other

## 2014-12-12 DIAGNOSIS — R112 Nausea with vomiting, unspecified: Secondary | ICD-10-CM

## 2014-12-12 DIAGNOSIS — L8990 Pressure ulcer of unspecified site, unspecified stage: Secondary | ICD-10-CM | POA: Insufficient documentation

## 2014-12-12 LAB — GLUCOSE, CAPILLARY
GLUCOSE-CAPILLARY: 226 mg/dL — AB (ref 65–99)
GLUCOSE-CAPILLARY: 165 mg/dL — AB (ref 65–99)
GLUCOSE-CAPILLARY: 169 mg/dL — AB (ref 65–99)
Glucose-Capillary: 178 mg/dL — ABNORMAL HIGH (ref 65–99)
Glucose-Capillary: 197 mg/dL — ABNORMAL HIGH (ref 65–99)
Glucose-Capillary: 201 mg/dL — ABNORMAL HIGH (ref 65–99)

## 2014-12-12 LAB — CBC WITH DIFFERENTIAL/PLATELET
Basophils Absolute: 0.2 10*3/uL — ABNORMAL HIGH (ref 0.0–0.1)
Basophils Relative: 1 %
EOS ABS: 0 10*3/uL (ref 0.0–0.7)
EOS PCT: 0 %
HEMATOCRIT: 33.6 % — AB (ref 36.0–46.0)
Hemoglobin: 10.2 g/dL — ABNORMAL LOW (ref 12.0–15.0)
LYMPHS ABS: 1.3 10*3/uL (ref 0.7–4.0)
Lymphocytes Relative: 6 %
MCH: 25.3 pg — ABNORMAL LOW (ref 26.0–34.0)
MCHC: 30.4 g/dL (ref 30.0–36.0)
MCV: 83.4 fL (ref 78.0–100.0)
MONO ABS: 3.1 10*3/uL — AB (ref 0.1–1.0)
Monocytes Relative: 14 %
Neutro Abs: 17.3 10*3/uL — ABNORMAL HIGH (ref 1.7–7.7)
Neutrophils Relative %: 79 %
PLATELETS: 60 10*3/uL — AB (ref 150–400)
RBC: 4.03 MIL/uL (ref 3.87–5.11)
RDW: 18.4 % — AB (ref 11.5–15.5)
WBC: 21.9 10*3/uL — AB (ref 4.0–10.5)

## 2014-12-12 LAB — COMPREHENSIVE METABOLIC PANEL
ALK PHOS: 715 U/L — AB (ref 38–126)
ALT: 254 U/L — AB (ref 14–54)
AST: 145 U/L — AB (ref 15–41)
Albumin: 1.8 g/dL — ABNORMAL LOW (ref 3.5–5.0)
Anion gap: 12 (ref 5–15)
BILIRUBIN TOTAL: 3.6 mg/dL — AB (ref 0.3–1.2)
BUN: 35 mg/dL — AB (ref 6–20)
CALCIUM: 7.2 mg/dL — AB (ref 8.9–10.3)
CHLORIDE: 107 mmol/L (ref 101–111)
CO2: 21 mmol/L — ABNORMAL LOW (ref 22–32)
CREATININE: 3.74 mg/dL — AB (ref 0.44–1.00)
GFR, EST AFRICAN AMERICAN: 14 mL/min — AB (ref >60)
GFR, EST NON AFRICAN AMERICAN: 12 mL/min — AB (ref >60)
Glucose, Bld: 237 mg/dL — ABNORMAL HIGH (ref 65–99)
Potassium: 3 mmol/L — ABNORMAL LOW (ref 3.5–5.1)
Sodium: 140 mmol/L (ref 135–145)
Total Protein: 5.3 g/dL — ABNORMAL LOW (ref 6.5–8.1)

## 2014-12-12 LAB — PROCALCITONIN: Procalcitonin: 5.13 ng/mL

## 2014-12-12 LAB — MAGNESIUM: Magnesium: 1.8 mg/dL (ref 1.7–2.4)

## 2014-12-12 LAB — PHOSPHORUS: PHOSPHORUS: 4.5 mg/dL (ref 2.5–4.6)

## 2014-12-12 MED ORDER — POTASSIUM CHLORIDE 10 MEQ/50ML IV SOLN
10.0000 meq | INTRAVENOUS | Status: AC
  Administered 2014-12-12 (×5): 10 meq via INTRAVENOUS
  Filled 2014-12-12 (×5): qty 50

## 2014-12-12 MED ORDER — POTASSIUM CHLORIDE IN NACL 40-0.9 MEQ/L-% IV SOLN
INTRAVENOUS | Status: DC
  Administered 2014-12-12 – 2014-12-13 (×3): 100 mL/h via INTRAVENOUS
  Filled 2014-12-12 (×7): qty 1000

## 2014-12-12 NOTE — Progress Notes (Addendum)
PULMONARY / CRITICAL CARE MEDICINE   Name: Ciclali Decoux MRN: [id number] DOB: 08/12/1950    ADMISSION DATE:  11/25/2014 CONSULTATION DATE: 11/27  REFERRING MD :  EDP, DR Billy Fischer  CHIEF COMPLAINT:  Cardiac Arrest   BRIEF SUMMARY:   64 yr old female, h/o carcinoid tumor liver, chronic diarhea, poor intake, PEA arrest at home on toilet with 6 min CPR, hypokalemia on admit.    SUBJECTIVE: Large emesis. Tube feeds on hold. Continues on Levophed.  VITAL SIGNS: BP 81/62 mmHg  Pulse 81  Temp(Src) 96.9 F (36.1 C) (Axillary)  Resp 12  Ht 6\' 4"  (1.93 m)  Wt 335 lb 12.2 oz (152.3 kg)  BMI 40.89 kg/m2  SpO2 100%  HEMODYNAMICS:   VENTILATOR SETTINGS:   INTAKE / OUTPUT: I/O last 3 completed shifts: In: [id number] [I.V.:2145; Other:120; NG/GT:2000; IV Piggyback:100] Out: 2470 [Urine:220; Emesis/NG output:1500; Stool:750]  PHYSICAL EXAMINATION: General: Awake, nonverbal Neuro: Eyes open, no gross focal deficits, HEENT: Moist mucous membranes Cardiovascular: RRR, no MRG Lungs: Nonlabored, clear. Abdomen:  BS+, old scars, obese, mild diffuse tenderness Musculoskeletal:  Generalized anasarca Skin:  No rash   LABS:  CBC  Recent Labs Lab 12/10/14 0447 12/11/14 0530 12/12/14 0415  WBC 16.4* 22.7* 21.9*  HGB 10.6* 10.1* 10.2*  HCT 33.3* 32.0* 33.6*  PLT 60* 73* 60*   Coag's  Recent Labs Lab 12/07/14 0630 12/08/14 0200 12/09/14 0450  INR 2.28* 2.07* 2.00*     BMET  Recent Labs Lab 12/10/14 1541 12/11/14 0530 12/12/14 0415  NA 139 139 140  K 3.5 3.2* 3.0*  CL 107 109 107  CO2 20* 19* 21*  BUN 29* 31* 35*  CREATININE 3.41* 3.48* 3.74*  GLUCOSE 154* 177* 237*   Electrolytes  Recent Labs Lab 12/06/14 0400  12/10/14 1541 12/11/14 0530 12/12/14 0415  CALCIUM 6.6*  < > 6.7* 7.0* 7.2*  MG 2.1  --   --  1.4* 1.8  PHOS  --   --   --  4.3 4.5  < > = values in this interval not displayed.   Sepsis Markers No results for input(s): LATICACIDVEN, PROCALCITON,  O2SATVEN in the last 168 hours. ABG  Recent Labs Lab 12/08/14 0301 12/08/14 0952 12/11/14 0934  PHART 7.517* 7.373 7.350  PCO2ART 17.6* 32.6* 30.6*  PO2ART 113* 75.0* 57.0*   Liver Enzymes  Recent Labs Lab 12/10/14 0447 12/11/14 0530 12/12/14 0415  AST 377* 220* 145*  ALT 441* 320* 254*  ALKPHOS 680* 687* 715*  BILITOT 3.5* 3.9* 3.6*  ALBUMIN 1.6* 1.7* 1.8*   Cardiac Enzymes No results for input(s): TROPONINI, PROBNP in the last 168 hours. Glucose  Recent Labs Lab 12/11/14 1136 12/11/14 1646 12/11/14 2017 12/12/14 0030 12/12/14 0415 12/12/14 0745  GLUCAP 194* 219* 222* 201* 226* 165*    Imaging Dg Chest Port 1 View  12/12/2014  CLINICAL DATA:  Acute respiratory failure. EXAM: PORTABLE CHEST 1 VIEW COMPARISON:  12/11/2014. FINDINGS: Feeding tube and left IJ line in stable position . Cardiomegaly low lung volumes with persistent bibasilar atelectasis and/or infiltrates. No interim change. No pleural effusion or pneumothorax. IMPRESSION: 1. Feeding tube and left IJ line in stable position. 2. Low lung volumes with bibasilar atelectasis and/or infiltrates. No interim change . Electronically Signed   By: Marcello Moores  Register   On: 12/12/2014 07:02     STUDIES:  Echo 11/27 >> rv hypokinetic, mod dilated, pa 40, LV 50%-55% CT 11/28 >> No evidence of bowel necrosis. Proximal colonic wall thickening which could be  ischemic or infectious .6 cm left adrenal mass, favor metastasis over hematoma. History of carcinoid with extensive intra-abdominal adenopathy & hepatic metastatic disease.  CULTURES: [facility] 11/27 >> Corynebacterium cdiff neg 11/27 >> negative  O and P 11/28 >> neg Stool culture / enteric pathogens 11/28 >> neg  ANTIBIOTICS: ceftaz 11/27 >> 11/29 vanc 11/27 >> 11/30 Flagyl 11/27 >> 12/4 Levaquin 11/30 >> 12/4  SIGNIFICANT EVENTS: 11/27 - arrest 56 min , pea , unwitnessed , low K , electrolytes issue 11/28 - levo down, urine output low 11/29 - arf, crt rise,  cvp 2, volume given  12/01 - 7 liters pos!!, stool output reduced, extubated  12/03 - Off levo briefly, midodrine initiated  12/04 - Decreased mental status  LINES/TUBES: 11/27  left IJ >>  11/27  ett >> 12/01 11/27  rt fem a line >> out   ASSESSMENT / PLAN:  PULMONARY A: Acute resp failure - in setting of PEA arrest.  Extubated 12/2.  Low lung volumes, L>R airspace disease.   Left lower lobe ? Mass P:  Pulmonary hygiene:  IS, mobilize  PT efforts  Intermittent CXR  Consider chest CT at some point to evaluate LLL Assess ABG with worsening CO2 on BMP & mental status change PRN albuterol for wheezing - ? 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 BP continues to be stable Imodium 4 mg Q6H Holding off Vaso secondary to concerns about bowel ischemia Stress steroids  Continue midodrine 10 mg TID 12/4   RENAL A:   ARF -  unknown baseline, diuretic dependent.  Worsening renal failure 12/4. NG Metabolic Acidosis - suspect diarrhea contributing factor, AG 11  Lactic acidosis - post arrest Non-AG  Hypokalemia ATN P:   Trend BMP / UOP  Replace electrolytes as indicated  On bicarb gtt 50cc/hour for NGMA Add normal saline at 100cc/hour to keep up with GI loss  GASTROINTESTINAL A:   Liver cancer carcinoid - extensive, bowel necrosis ruled out Shock Liver Chronic diarrhea from carcinoid P:   Trend LFT's Reduce stool output, continue Imodium 4 mg Q6H + opium 5mg  Q12 Tube feeds on hold secondary to large emesis. Check abdominal x-ray PPI  HEMATOLOGIC A:   DVT prevention - doubt PE clinically significant Coagulapthy - in setting of liver carcinoid Thrombocytopenia (MODS, liver cancer) - heparin d/c'd 12/1 P:  Doppler neg, RV does not explain shock state SCD's for DVT prophylaxis    INFECTIOUS A:   Leukocytosis - elevated WBC Immunocompromised host R/O Infectious Colitis  P:   Off all antibiotics now Stool cultures negative  Monitor fever curve / WBC Check C. Difficile Check pro calcitonin  ENDOCRINE A:   Adrenal cancer P:   SSI Stress dose steroids as above  NEUROLOGIC A:   S/p arrest, r/o anoxia (woke up post code) High risk hepatic encephalopathy P:   RASS goal: 0 PT  Avoid benzo with liver Lactulose 20 gm BID with mental status change 12/4 Follow-up ammonia levels.  FAMILY  - Updates: Family updated by Dr. Ashok Cordia on 12/4. Patient declining despite maximal medical therapy. - Inter-disciplinary family meet or Palliative Care meeting due by:  Consider Palliative Care consult.  Critical care time-35 minutes  Marshell Garfinkel MD [facility] Pager [phone] If no answer or after 3pm call: [phone] 12/12/2014,  9:39 AM

## 2014-12-12 NOTE — Progress Notes (Signed)
Dr. Vaughan Browner (CCM MD) made aware of trending low BPs 50s-70s.  Will order and restart Levophed and continue to monitor.

## 2014-12-12 NOTE — Progress Notes (Signed)
SLP Cancellation Note  Patient Details Name: Yesenia Lyons MRN: [id number] DOB: 1950/01/19   Cancelled treatment:        RN reports pt is not appropriate to attempt po trials due to decrease awareness and ability to follow commands. Has NGT. Will continue efforts next date.    [name] 12/12/2014, 12:31 PM  [name].Ed [employer] [phone]

## 2014-12-12 NOTE — Progress Notes (Signed)
Physical Therapy Treatment Patient Details Name: Yesenia Lyons MRN: [id number] DOB: [birth year] Today's Date: 12/12/2014    History of Present Illness 64 yr old female, h/o carcinoid tumor liver, chronic diarhea, poor intake, coded pea home on toilet 6 min , K noted low. ETT 11/27-12/1.     PT Comments    Pt admitted with above diagnosis. Pt currently with functional limitations due to balance, strength and endurance deficits. Pt with poor responsiveness and hypotensive.  Incr edema full body today.  Unable to arouse pt withexercise.  PROM only.  Will continue PT as able.   Pt will benefit from skilled PT to increase their independence and safety with mobility to allow discharge to the venue listed below.    Follow Up Recommendations  SNF;Supervision/Assistance - 24 hour     Equipment Recommendations  Other (comment) (TBA)    Recommendations for Other Services       Precautions / Restrictions Precautions Precautions: Fall Restrictions Weight Bearing Restrictions: No    Mobility  Bed Mobility               General bed mobility comments: unable to roll pt as she is not responding to commands.   Transfers                    Ambulation/Gait                 Stairs            Wheelchair Mobility    Modified Rankin (Stroke Patients Only)       Balance                                    Cognition Arousal/Alertness: Lethargic Behavior During Therapy: Flat affect Overall Cognitive Status: Impaired/Different from baseline Area of Impairment: Orientation;Following commands;Safety/judgement;Problem solving;Memory Orientation Level: Disoriented to;Person;Place;Time;Situation   Memory: Decreased short-term memory         General Comments: Not following commands today at all.    Exercises General Exercises - Upper Extremity Shoulder Flexion: PROM;Both;10 reps;Supine Shoulder ABduction: PROM;Both;10 reps;Supine Elbow Flexion:  PROM;Both;10 reps;Supine General Exercises - Lower Extremity Ankle Circles/Pumps: PROM;Both;10 reps;Supine Heel Slides: PROM;Both;5 reps;Supine    General Comments General comments (skin integrity, edema, etc.): Pt with full body incr edema. LEs much more swollen than previous treatment.       Pertinent Vitals/Pain Pain Assessment:  (unable to assess due to decr responsiveness)  HR 80 bpm, 100% on 4LO2, BP 77/60 on arrival.     Home Living                      Prior Function            PT Goals (current goals can now be found in the care plan section) Progress towards PT goals: Not progressing toward goals - comment (decr responsiveness, attempted to incr responsiveness)    Frequency  Min 3X/week    PT Plan Current plan remains appropriate    Co-evaluation             End of Session Equipment Utilized During Treatment: Oxygen Activity Tolerance: Patient limited by fatigue;Patient limited by lethargy Patient left: in bed;with call bell/phone within reach;with SCD's reapplied     Time: 1201-1209 PT Time Calculation (min) (ACUTE ONLY): 8 min  Charges:  $Therapeutic Exercise: 8-22 mins  G CodesDenice Paradise 12/12/2014, 3:30 PM  [employer] [facility] [phone] (pager)

## 2014-12-12 NOTE — Progress Notes (Signed)
Inpatient Diabetes Program Recommendations  AACE/ADA: New Consensus Statement on Inpatient Glycemic Control (2015)  Target Ranges:  Prepandial:   less than 140 mg/dL      Peak postprandial:   less than 180 mg/dL (1-2 hours)      Critically ill patients:  140 - 180 mg/dL   Review of Glycemic Control  Inpatient Diabetes Program Recommendations:  Correction (SSI): consider increasing to moderate scale during steroid therapy Thank you  Raoul Pitch BSN, RN,CDE Inpatient Diabetes Coordinator [phone] (team pager)

## 2014-12-12 NOTE — Progress Notes (Signed)
Patient vomited large amount of yellow vomitus and bile during a bath. Tube feeding on hold, Dr. Lake Bells notified. Zofran administered, patient appears to be comfortable at this time. O2 sats in upper 90s to 100% on 4L [place]. Lung sounds unchanged, VSS.

## 2014-12-12 NOTE — Telephone Encounter (Signed)
Personnel called to inform RN that patient is in [facility] ICU and will be sent to palliative care. Message sent to MD [facility].

## 2014-12-12 NOTE — Progress Notes (Signed)
Notified Dr. Nelda Marseille regarding no UOP and intermittent LOC and that Levophed was restarted d/t low BP.  Informed of elevated ammonia level on 12/4 and elevated BUN and Creatinine.  Will keep SBP >90 and MAP >65.  Continue to titrate Levophed as needed for pressure support.  Will monitor for any further changes.

## 2014-12-12 NOTE — Care Management Important Message (Signed)
Important Message  Patient Details  Name: Yesenia Lyons MRN: [id number] Date of Birth: 12-13-1950   Medicare Important Message Given:  Yes    [name] [name] 12/12/2014, 11:37 AM

## 2014-12-12 NOTE — Progress Notes (Signed)
Daily Rounding Note  12/12/2014, 10:32 AM  LOS: 8 days   SUBJECTIVE:       stol output 113ml on 12/3, 650 ml on 12/4: significantly decreased from max levels of 2 liters last week.  CCM restarted Lactulose yesterday.  Tube feeds discontinued yesterday due to large volume of yellow/bilious emesis.    OBJECTIVE:         Vital signs in last 24 hours:    Temp:  [96.1 F (35.6 C)-97.4 F (36.3 C)] 96.9 F (36.1 C) (12/05 0747) Pulse Rate:  [73-93] 81 (12/05 0800) Resp:  [7-22] 12 (12/05 0800) BP: (77-112)/(48-86) 81/62 mmHg (12/05 0800) SpO2:  [98 %-100 %] 100 % (12/05 0800) Weight:  [152.3 kg (335 lb 12.2 oz)] 152.3 kg (335 lb 12.2 oz) (12/05 0418) Last BM Date: 12/12/14 Filed Weights   12/10/14 0602 12/11/14 0200 12/12/14 0418  Weight: 147.6 kg (325 lb 6.4 oz) 149.7 kg (330 lb 0.5 oz) 152.3 kg (335 lb 12.2 oz)   General: eyes open, not tracking, not following commands   Heart: RRR Chest: clear bil in front.   Abdomen: obese, NT, flexiseal effluent in watery/brown with sediment at bottom.  Blood seen and abd tenderness observed last week has resolved  Extremities: + bil upper and lower ext edema.  Neuro/Psych:  Not following commands or speaking does reveal some response to her name.   Intake/Output from previous day: 12/04 0701 - 12/05 0700 In: 2527 [I.V.:1217; NG/GT:1190] Out: 2270 [Urine:120; Emesis/NG output:1500; Stool:650]  Intake/Output this shift: Total I/O In: 30 [I.V.:30] Out: -   Lab Results:  Recent Labs  12/10/14 0447 12/11/14 0530 12/12/14 0415  WBC 16.4* 22.7* 21.9*  HGB 10.6* 10.1* 10.2*  HCT 33.3* 32.0* 33.6*  PLT 60* 73* 60*   BMET  Recent Labs  12/10/14 1541 12/11/14 0530 12/12/14 0415  NA 139 139 140  K 3.5 3.2* 3.0*  CL 107 109 107  CO2 20* 19* 21*  GLUCOSE 154* 177* 237*  BUN 29* 31* 35*  CREATININE 3.41* 3.48* 3.74*  CALCIUM 6.7* 7.0* 7.2*   LFT  Recent Labs  12/10/14 0447 12/11/14 0530 12/12/14 0415  PROT 5.2* 5.5* 5.3*  ALBUMIN 1.6* 1.7* 1.8*  AST 377* 220* 145*  ALT 441* 320* 254*  ALKPHOS 680* 687* 715*  BILITOT 3.5* 3.9* 3.6*   PT/INR No results for input(s): LABPROT, INR in the last 72 hours. Hepatitis Panel No results for input(s): HEPBSAG, HCVAB, HEPAIGM, HEPBIGM in the last 72 hours.  Studies/Results: Dg Chest Port 1 View  12/12/2014  CLINICAL DATA:  Acute respiratory failure. EXAM: PORTABLE CHEST 1 VIEW COMPARISON:  12/11/2014. FINDINGS: Feeding tube and left IJ line in stable position . Cardiomegaly low lung volumes with persistent bibasilar atelectasis and/or infiltrates. No interim change. No pleural effusion or pneumothorax. IMPRESSION: 1. Feeding tube and left IJ line in stable position. 2. Low lung volumes with bibasilar atelectasis and/or infiltrates. No interim change . Electronically Signed   By: Marcello Moores  Register   On: 12/12/2014 07:02   Dg Chest Port 1 View  12/11/2014  CLINICAL DATA:  Acute respiratory failure. EXAM: PORTABLE CHEST 1 VIEW COMPARISON:  12/07/2014 and 12/08/2014. FINDINGS: 0509 hours. Endotracheal tube no longer visualized. The thoracic inlet is barely included on this image. Feeding tube projects into the gastric fundus. Nasogastric tube has been removed. Left IJ central venous catheter is unchanged at the mid SVC level. There are persistent low lung volumes with  unchanged left-greater-than-right basilar airspace opacities. No pneumothorax or significant pleural effusion is seen. The heart size and mediastinal contours are stable. IMPRESSION: Persistent low lung volumes with unchanged left-greater-than-right basilar airspace opacities. Support system as above. Electronically Signed   By: Richardean Sale M.D.   On: 12/11/2014 07:17   Scheduled Meds: . antiseptic oral rinse  7 mL Mouth Rinse q12n4p  . chlorhexidine  15 mL Mouth Rinse BID  . hydrocortisone sod succinate (SOLU-CORTEF) inj  50 mg Intravenous Q6H    . insulin aspart  0-9 Units Subcutaneous 6 times per day  . lactulose  20 g Per Tube BID  . loperamide  4 mg Per Tube 4 times per day  . midodrine  10 mg Oral TID WC  . Opium  5 mg Oral Q12H  . pantoprazole (PROTONIX) IV  40 mg Intravenous QHS  . potassium chloride  10 mEq Intravenous Q1 Hr x 5  . sodium chloride  10-40 mL Intracatheter Q12H   Continuous Infusions: . 0.9 % NaCl with KCl 40 mEq / L    . feeding supplement (VITAL AF 1.2 CAL) Stopped (12/11/14 2345)  . norepinephrine (LEVOPHED) Adult infusion Stopped (12/11/14 1100)  .  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   Presumed ischemic colitis post cardiac arrest.  On tincture of opium, qid imodium.  Completed 1 week empiric Flagyl 12/4.   stool volume has diminished.   *  Vomiting. Bilious.  TF stopped.   *  Neurogenic dysphagia.  Remains too obtunded for PO.   *  Shock liver. LFTs steadily improving.  Ammonia level 76.  Lactulose was stopped last week but restarted yesterday.   *  AKI  PLAN   *  Stopped Lactulose.  If concern is that HE is contributing to mental status changes, would use Rifaximin which does not cause diarrhea.   *  ? rechallenge with low volume TF?  May want to get KUB before doing that.  *  Any thought of obtaining EEG/neuro consult?     Azucena Freed  12/12/2014, 10:32 AM Pager: [phone]     Attending physician's note   I have taken an interval history, reviewed the chart and examined the patient. I agree with the Advanced Practitioner's note, impression and recommendations.  New N/V. R/O ileus. Check KUB. Resume TF at low rate if KUB is ok.  Resolving ischemic colitis and resolving shock liver.  Neurogenic dysphagia post arrest. Consider Neuro evaluation for persistent  mental status changes, per CCM.  Chronic diarrhea in setting of prior cholecystectomy and metastatic carcinoid. Diarrhea controlled with loperamide and tincture of opium. DC lactulose as it will exacerbate diarrhea and not convinced she has HE. Can start Xifaxan if HE is a concern.   Lucio Edward, MD [facility] [phone] Mon-Fri 8a-5p [phone] after 5p, weekends, holidays

## 2014-12-12 NOTE — Progress Notes (Signed)
C-Diff result negative from 11/27 labwork

## 2014-12-13 LAB — CBC WITH DIFFERENTIAL/PLATELET
BAND NEUTROPHILS: 12 %
BASOS PCT: 0 %
Basophils Absolute: 0 10*3/uL (ref 0.0–0.1)
Blasts: 0 %
EOS PCT: 0 %
Eosinophils Absolute: 0 10*3/uL (ref 0.0–0.7)
HEMATOCRIT: 34.6 % — AB (ref 36.0–46.0)
Hemoglobin: 11 g/dL — ABNORMAL LOW (ref 12.0–15.0)
LYMPHS ABS: 2.6 10*3/uL (ref 0.7–4.0)
LYMPHS PCT: 7 %
MCH: 26.6 pg (ref 26.0–34.0)
MCHC: 31.8 g/dL (ref 30.0–36.0)
MCV: 83.6 fL (ref 78.0–100.0)
MONO ABS: 3 10*3/uL — AB (ref 0.1–1.0)
Metamyelocytes Relative: 3 %
Monocytes Relative: 8 %
Myelocytes: 2 %
NEUTROS ABS: 31.7 10*3/uL — AB (ref 1.7–7.7)
NEUTROS PCT: 68 %
NRBC: 19 /100{WBCs} — AB
OTHER: 0 %
Platelets: 114 10*3/uL — ABNORMAL LOW (ref 150–400)
Promyelocytes Absolute: 0 %
RBC: 4.14 MIL/uL (ref 3.87–5.11)
RDW: 19.7 % — AB (ref 11.5–15.5)
WBC: 37.3 10*3/uL — ABNORMAL HIGH (ref 4.0–10.5)

## 2014-12-13 LAB — COMPREHENSIVE METABOLIC PANEL
ALBUMIN: 1.8 g/dL — AB (ref 3.5–5.0)
ALT: 204 U/L — AB (ref 14–54)
ANION GAP: 10 (ref 5–15)
AST: 121 U/L — ABNORMAL HIGH (ref 15–41)
Alkaline Phosphatase: 734 U/L — ABNORMAL HIGH (ref 38–126)
BUN: 37 mg/dL — ABNORMAL HIGH (ref 6–20)
CHLORIDE: 109 mmol/L (ref 101–111)
CO2: 21 mmol/L — AB (ref 22–32)
Calcium: 7.3 mg/dL — ABNORMAL LOW (ref 8.9–10.3)
Creatinine, Ser: 3.87 mg/dL — ABNORMAL HIGH (ref 0.44–1.00)
GFR calc non Af Amer: 11 mL/min — ABNORMAL LOW (ref >60)
GFR, EST AFRICAN AMERICAN: 13 mL/min — AB (ref >60)
GLUCOSE: 269 mg/dL — AB (ref 65–99)
Potassium: 4.1 mmol/L (ref 3.5–5.1)
SODIUM: 140 mmol/L (ref 135–145)
Total Bilirubin: 3.9 mg/dL — ABNORMAL HIGH (ref 0.3–1.2)
Total Protein: 5.6 g/dL — ABNORMAL LOW (ref 6.5–8.1)

## 2014-12-13 LAB — MAGNESIUM: Magnesium: 1.8 mg/dL (ref 1.7–2.4)

## 2014-12-13 LAB — GLUCOSE, CAPILLARY
GLUCOSE-CAPILLARY: 226 mg/dL — AB (ref 65–99)
GLUCOSE-CAPILLARY: 240 mg/dL — AB (ref 65–99)
GLUCOSE-CAPILLARY: 243 mg/dL — AB (ref 65–99)
Glucose-Capillary: 228 mg/dL — ABNORMAL HIGH (ref 65–99)

## 2014-12-13 LAB — PHOSPHORUS: Phosphorus: 5 mg/dL — ABNORMAL HIGH (ref 2.5–4.6)

## 2014-12-13 LAB — PROCALCITONIN: PROCALCITONIN: 4.44 ng/mL

## 2014-12-13 LAB — C DIFFICILE QUICK SCREEN W PCR REFLEX
C DIFFICILE (CDIFF) INTERP: NEGATIVE
C Diff antigen: NEGATIVE
C Diff toxin: NEGATIVE

## 2014-12-13 LAB — AMMONIA: Ammonia: 124 umol/L — ABNORMAL HIGH (ref 9–35)

## 2014-12-13 MED ORDER — GLYCOPYRROLATE 1 MG PO TABS
1.0000 mg | ORAL_TABLET | ORAL | Status: DC | PRN
  Filled 2014-12-13: qty 1

## 2014-12-13 MED ORDER — POLYVINYL ALCOHOL 1.4 % OP SOLN
1.0000 [drp] | Freq: Four times a day (QID) | OPHTHALMIC | Status: DC | PRN
  Filled 2014-12-13: qty 15

## 2014-12-13 MED ORDER — ACETAMINOPHEN 650 MG RE SUPP: 650.0000 mg | Freq: Four times a day (QID) | RECTAL | Status: DC | PRN

## 2014-12-13 MED ORDER — ONDANSETRON HCL 4 MG/2ML IJ SOLN: 4.0000 mg | Freq: Four times a day (QID) | INTRAMUSCULAR | Status: DC | PRN

## 2014-12-13 MED ORDER — LORAZEPAM 2 MG/ML IJ SOLN: 1.0000 mg | INTRAMUSCULAR | Status: DC | PRN

## 2014-12-13 MED ORDER — SODIUM CHLORIDE 0.9 % IV SOLN
INTRAVENOUS | Status: DC
  Administered 2014-12-13: 19:00:00 via INTRAVENOUS

## 2014-12-13 MED ORDER — GLYCOPYRROLATE 0.2 MG/ML IJ SOLN
0.2000 mg | INTRAMUSCULAR | Status: DC | PRN
  Administered 2014-12-14: 0.2 mg via INTRAVENOUS
  Filled 2014-12-13 (×2): qty 1

## 2014-12-13 MED ORDER — HALOPERIDOL LACTATE 2 MG/ML PO CONC
0.5000 mg | ORAL | Status: DC | PRN
  Filled 2014-12-13: qty 0.3

## 2014-12-13 MED ORDER — HALOPERIDOL 1 MG PO TABS
0.5000 mg | ORAL_TABLET | ORAL | Status: DC | PRN
  Filled 2014-12-13: qty 1

## 2014-12-13 MED ORDER — ACETAMINOPHEN 325 MG PO TABS: 650.0000 mg | ORAL_TABLET | Freq: Four times a day (QID) | ORAL | Status: DC | PRN

## 2014-12-13 MED ORDER — LORAZEPAM 2 MG/ML PO CONC: 1.0000 mg | ORAL | Status: DC | PRN

## 2014-12-13 MED ORDER — HALOPERIDOL LACTATE 5 MG/ML IJ SOLN: 0.5000 mg | INTRAMUSCULAR | Status: DC | PRN

## 2014-12-13 MED ORDER — MORPHINE SULFATE (PF) 2 MG/ML IV SOLN: 1.0000 mg | INTRAVENOUS | Status: DC | PRN

## 2014-12-13 MED ORDER — LORAZEPAM 1 MG PO TABS: 1.0000 mg | ORAL_TABLET | ORAL | Status: DC | PRN

## 2014-12-13 MED ORDER — GLYCOPYRROLATE 0.2 MG/ML IJ SOLN
0.2000 mg | INTRAMUSCULAR | Status: DC | PRN
  Filled 2014-12-13: qty 1

## 2014-12-13 MED ORDER — ONDANSETRON 4 MG PO TBDP: 4.0000 mg | ORAL_TABLET | Freq: Four times a day (QID) | ORAL | Status: DC | PRN

## 2014-12-13 MED ORDER — BIOTENE DRY MOUTH MT LIQD: 15.0000 mL | OROMUCOSAL | Status: DC | PRN

## 2014-12-13 NOTE — Progress Notes (Signed)
PULMONARY / CRITICAL CARE MEDICINE   Name: Yesenia Lyons MRN: [id number] DOB: 06/10/1950    ADMISSION DATE:  12/01/2014 CONSULTATION DATE: 11/27  REFERRING MD :  EDP, DR Billy Fischer  CHIEF COMPLAINT:  Cardiac Arrest   BRIEF SUMMARY:   64 yr old female, h/o carcinoid tumor liver, chronic diarhea, poor intake, PEA arrest at home on toilet with 6 min CPR, hypokalemia on admit.    SUBJECTIVE: Feeds on hold. Restart her on Levophed. She is now anuric  VITAL SIGNS: BP 86/67 mmHg  Pulse 77  Temp(Src) 96.5 F (35.8 C) (Axillary)  Resp 13  Ht 6\' 4"  (1.93 m)  Wt 334 lb 10.5 oz (151.8 kg)  BMI 40.75 kg/m2  SpO2 99%  HEMODYNAMICS:   VENTILATOR SETTINGS:   INTAKE / OUTPUT: I/O last 3 completed shifts: In: 4814.7 [I.V.:4024.7; NG/GT:590; IV Piggyback:200] Out: 2705 [Urine:130; Emesis/NG output:1500; [id number]  PHYSICAL EXAMINATION: General: Awake, nonverbal Neuro: Eyes open, no gross focal deficits, HEENT: Moist mucous membranes Cardiovascular: RRR, no MRG Lungs: Nonlabored, clear. Abdomen:  BS+, old scars, obese, mild diffuse tenderness Musculoskeletal:  Generalized anasarca Skin:  No rash   LABS:  CBC  Recent Labs Lab 12/11/14 0530 12/12/14 0415 12/13/14 0330  WBC 22.7* 21.9* 37.3*  HGB 10.1* 10.2* 11.0*  HCT 32.0* 33.6* 34.6*  PLT 73* 60* 114*   Coag's  Recent Labs Lab 12/07/14 0630 12/08/14 0200 12/09/14 0450  INR 2.28* 2.07* 2.00*     BMET  Recent Labs Lab 12/11/14 0530 12/12/14 0415 12/13/14 0330  NA 139 140 140  K 3.2* 3.0* 4.1  CL 109 107 109  CO2 19* 21* 21*  BUN 31* 35* 37*  CREATININE 3.48* 3.74* 3.87*  GLUCOSE 177* 237* 269*   Electrolytes  Recent Labs Lab 12/11/14 0530 12/12/14 0415 12/13/14 0330  CALCIUM 7.0* 7.2* 7.3*  MG 1.4* 1.8 1.8  PHOS 4.3 4.5 5.0*     Sepsis Markers  Recent Labs Lab 12/12/14 1027 12/13/14 0330  PROCALCITON 5.13 4.44   ABG  Recent Labs Lab 12/08/14 0301 12/08/14 0952 12/11/14 0934   PHART 7.517* 7.373 7.350  PCO2ART 17.6* 32.6* 30.6*  PO2ART 113* 75.0* 57.0*   Liver Enzymes  Recent Labs Lab 12/11/14 0530 12/12/14 0415 12/13/14 0330  AST 220* 145* 121*  ALT 320* 254* 204*  ALKPHOS 687* 715* 734*  BILITOT 3.9* 3.6* 3.9*  ALBUMIN 1.7* 1.8* 1.8*   Cardiac Enzymes No results for input(s): TROPONINI, PROBNP in the last 168 hours. Glucose  Recent Labs Lab 12/12/14 1253 12/12/14 1601 12/12/14 2039 12/13/14 0024 12/13/14 0333 12/13/14 0744  GLUCAP 178* 169* 197* 228* 226* 240*    Imaging Dg Abd Portable 1v  12/12/2014  CLINICAL DATA:  Loose stools EXAM: PORTABLE ABDOMEN - 1 VIEW COMPARISON:  None. FINDINGS: Mild gaseous distension of transverse colon. Mild gaseous distended small bowel loops in mid abdomen suspicious for ileus or enteritis. Postcholecystectomy surgical clips are noted. IMPRESSION: Mild gaseous distension of transverse colon. Mild gaseous distended small bowel loops mid abdomen suspicious for ileus or enteritis. Electronically Signed   By: Lahoma Crocker M.D.   On: 12/12/2014 12:44     STUDIES:  Echo 11/27 >> rv hypokinetic, mod dilated, pa 40, LV 50%-55% CT 11/28 >> No evidence of bowel necrosis. Proximal colonic wall thickening which could be ischemic or infectious .6 cm left adrenal mass, favor metastasis over hematoma. History of carcinoid with extensive intra-abdominal adenopathy & hepatic metastatic disease.  CULTURES: [facility] 11/27 >> Corynebacterium cdiff neg 11/27 >>  negative  O and P 11/28 >> neg Stool culture / enteric pathogens 11/28 >> neg  ANTIBIOTICS: ceftaz 11/27 >> 11/29 vanc 11/27 >> 11/30 Flagyl 11/27 >> 12/4 Levaquin 11/30 >> 12/4  SIGNIFICANT EVENTS: 11/27 - arrest 56 min , pea , unwitnessed , low K , electrolytes issue 11/28 - levo down, urine output low 11/29 - arf, crt rise, cvp 2, volume given  12/01 - 7 liters pos!!, stool output reduced, extubated  12/03 - Off levo briefly, midodrine initiated  12/04 -  Decreased mental status  LINES/TUBES: 11/27  left IJ >>  11/27  ett >> 12/01 11/27  rt fem a line >> out   ASSESSMENT / PLAN:  PULMONARY A: Acute resp failure - in setting of PEA arrest.  Extubated 12/2.  Low lung volumes, L>R airspace disease.   Left lower lobe ? Mass P:  Pulmonary hygiene:  IS, mobilize  PT efforts  Intermittent CXR  Consider chest CT at some point to evaluate LLL Assess ABG with worsening CO2 on BMP & mental status change PRN albuterol for wheezing - ? 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 Imodium 4 mg Q6H Holding off Vaso secondary to concerns about bowel ischemia Stress steroids  Continue midodrine 10 mg TID 12/4   RENAL A:   ARF -  unknown baseline, diuretic dependent.  Worsening renal failure 12/4. NG Metabolic Acidosis - suspect diarrhea contributing factor, AG 11  Lactic acidosis - post arrest Non-AG  Hypokalemia ATN Now anuric with worsening creatinine. I don't believe she is a good candidate for dialysis. P:   Trend BMP / UOP  Replace electrolytes as indicated  On bicarb gtt 50cc/hour for NGMA Normal saline at 100cc/hour to keep up with GI loss  GASTROINTESTINAL A:   Liver cancer carcinoid - extensive, bowel necrosis ruled out Shock Liver Chronic diarrhea from carcinoid P:   Trend LFT's Reduce stool output, continue Imodium 4 mg Q6H + opium 5mg  Q12 Abdominal x-ray consistent with ileus. NG tube to suction. Hold tube feeds. PPI  HEMATOLOGIC A:   DVT prevention - doubt PE clinically significant Coagulapthy - in setting of liver carcinoid Thrombocytopenia (MODS, liver cancer) - heparin d/c'd 12/1 P:  Doppler neg, RV does not explain shock state SCD's for DVT prophylaxis   INFECTIOUS A:   Leukocytosis - elevated WBC Immunocompromised  host R/O Infectious Colitis  P:   Off all antibiotics now Stool cultures negative  Monitor fever curve / WBC Check C. Difficile Follow pro calcitonin  ENDOCRINE A:   Adrenal cancer P:   SSI Stress dose steroids as above  NEUROLOGIC A:   S/p arrest, r/o anoxia (woke up post code) High risk hepatic encephalopathy P:   RASS goal: 0 PT  Avoid benzo with liver Follow-up ammonia levels.  FAMILY  - Updates: I spoke with her daughter Adonis Huguenin today [phone]. She agrees that her CODE STATUS needs to be DNR/DNI. She is agreeable to palliative care consult and consider hospice. - Inter-disciplinary family meet or Palliative Care meeting due by:  Consider Palliative Care consult.  Critical care time-35 minutes  Marshell Garfinkel MD [facility] Pager [phone] If no answer or after 3pm call: [phone] 12/13/2014, 11:05 AM

## 2014-12-13 NOTE — Progress Notes (Signed)
Speech Language Pathology Discharge Patient Details Name: Yesenia Lyons MRN: [id number] DOB: June 17, 1950 Today's Date: 12/13/2014 Time:  -     Patient discharged from SLP services secondary to: RN reported MD is suspicious pt may have an ileus and Palliative Care consult. ST will sign off. Please reconsult if pt becomes appropriate.   Please see latest therapy progress note for current level of functioning and progress toward goals.    Progress and discharge plan discussed with patient and/or caregiver: Patient unable to participate in discharge planning and no caregivers available  GO     [name] 12/13/2014, 2:25 PM

## 2014-12-13 NOTE — Progress Notes (Signed)
Inpatient Diabetes Program Recommendations  AACE/ADA: New Consensus Statement on Inpatient Glycemic Control (2015)  Target Ranges:  Prepandial:   less than 140 mg/dL      Peak postprandial:   less than 180 mg/dL (1-2 hours)      Critically ill patients:  140 - 180 mg/dL   Review of Glycemic Control  Patient takes lantus 10 units bid at home Please consider adding lantus 1/2 home dose here of 10 units daily in addition to correction q 4 hrs.  Thank you Rosita Kea, RN, MSN, CDE  Diabetes Inpatient Program Office: [phone] Pager: [phone] 8:00 am to 5:00 pm
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
## 2014-12-13 NOTE — Progress Notes (Addendum)
Daily Rounding Note  12/13/2014, 8:27 AM  LOS: 9 days   SUBJECTIVE:       Stool volume: 475 ml 12/5.  Remains npo with no tube feeds.   OBJECTIVE:         Vital signs in last 24 hours:    Temp:  [96.5 F (35.8 C)-99.5 F (37.5 C)] 96.5 F (35.8 C) (12/06 0745) Pulse Rate:  [65-156] 68 (12/06 0700) Resp:  [4-21] 14 (12/06 0700) BP: (57-115)/(40-81) 114/78 mmHg (12/06 0700) SpO2:  [94 %-100 %] 100 % (12/06 0700) Weight:  [151.8 kg (334 lb 10.5 oz)] 151.8 kg (334 lb 10.5 oz) (12/06 0330) Last BM Date: 12/13/14 Filed Weights   12/11/14 0200 12/12/14 0418 12/13/14 0330  Weight: 149.7 kg (330 lb 0.5 oz) 152.3 kg (335 lb 12.2 oz) 151.8 kg (334 lb 10.5 oz)   General: ill, morbidly obese.    Heart: RRR Chest: clear in front.  No labored breathing Abdomen: obese, NT, BS present, no tinkling or tympanitic sounds. Stool is brown/bilious watery, no blood.   Extremities: + edema in all 4 limbs.  Neuro/Psych:  Unable to elicit asterixis.  Eyes directed to left, do not track to voice or exam.  Unresponsive to voice.   Intake/Output from previous day: 12/05 0701 - 12/06 0700 In: 3834.7 [I.V.:3424.7; NG/GT:210; IV Piggyback:200] Out: 545 [Urine:70; Stool:475]  Intake/Output this shift:    Lab Results:  Recent Labs  12/11/14 0530 12/12/14 0415 12/13/14 0330  WBC 22.7* 21.9* 37.3*  HGB 10.1* 10.2* 11.0*  HCT 32.0* 33.6* 34.6*  PLT 73* 60* 114*   BMET  Recent Labs  12/11/14 0530 12/12/14 0415 12/13/14 0330  NA 139 140 140  K 3.2* 3.0* 4.1  CL 109 107 109  CO2 19* 21* 21*  GLUCOSE 177* 237* 269*  BUN 31* 35* 37*  CREATININE 3.48* 3.74* 3.87*  CALCIUM 7.0* 7.2* 7.3*   LFT  Recent Labs  12/11/14 0530 12/12/14 0415 12/13/14 0330  PROT 5.5* 5.3* 5.6*  ALBUMIN 1.7* 1.8* 1.8*  AST 220* 145* 121*  ALT 320* 254* 204*  ALKPHOS 687* 715* 734*  BILITOT 3.9* 3.6* 3.9*    Studies/Results: Dg Chest Port 1  View  12/12/2014  CLINICAL DATA:  Acute respiratory failure. EXAM: PORTABLE CHEST 1 VIEW COMPARISON:  12/11/2014. FINDINGS: Feeding tube and left IJ line in stable position . Cardiomegaly low lung volumes with persistent bibasilar atelectasis and/or infiltrates. No interim change. No pleural effusion or pneumothorax. IMPRESSION: 1. Feeding tube and left IJ line in stable position. 2. Low lung volumes with bibasilar atelectasis and/or infiltrates. No interim change . Electronically Signed   By: Marcello Moores  Register   On: 12/12/2014 07:02   Dg Abd Portable 1v  12/12/2014  CLINICAL DATA:  Loose stools EXAM: PORTABLE ABDOMEN - 1 VIEW COMPARISON:  None. FINDINGS: Mild gaseous distension of transverse colon. Mild gaseous distended small bowel loops in mid abdomen suspicious for ileus or enteritis. Postcholecystectomy surgical clips are noted. IMPRESSION: Mild gaseous distension of transverse colon. Mild gaseous distended small bowel loops mid abdomen suspicious for ileus or enteritis. Electronically Signed   By: Lahoma Crocker M.D.   On: 12/12/2014 12:44   Scheduled Meds: . antiseptic oral rinse  7 mL Mouth Rinse q12n4p  . chlorhexidine  15 mL Mouth Rinse BID  . hydrocortisone sod succinate (SOLU-CORTEF) inj  50 mg Intravenous Q6H  . insulin aspart  0-9 Units Subcutaneous 6 times per day  .  loperamide  4 mg Per Tube 4 times per day  . midodrine  10 mg Oral TID WC  . Opium  5 mg Oral Q12H  . pantoprazole (PROTONIX) IV  40 mg Intravenous QHS  . sodium chloride  10-40 mL Intracatheter Q12H   Continuous Infusions: . 0.9 % NaCl with KCl 40 mEq / L 100 mL/hr (12/13/14 0656)  . feeding supplement (VITAL AF 1.2 CAL) Stopped (12/11/14 2345)  . norepinephrine (LEVOPHED) Adult infusion 8 mcg/min (12/13/14 0717)  .  sodium bicarbonate  infusion 1000 mL 50 mL/hr at 12/13/14 0401   PRN Meds:.sodium chloride, albuterol, fentaNYL (SUBLIMAZE) injection, ondansetron (ZOFRAN) IV, sodium chloride   ASSESMENT:   * Acute  on chronic diarrhea in pt with metastatic carcinoid.  c diff and stool cultures negative.  Stool osmolality 327.  Presumed ischemic colitis post cardiac arrest. Bloody diarrhea last week, has resolved.  On tincture of opium, qid imodium.  Completed 1 week empiric Flagyl 12/4.  Stool volume has diminished.   *  Leukocytosis, temps intermittently hypothermic.   * Vomiting. Bilious. TF stopped. Ileus vs enteritis per KUB 12/5.  Opium/antidiarrheals could be contributing.   * Neurogenic dysphagia. Feeding tube in place.   * Shock liver. Transaminases steadily improving. alk phos rising, T bili stable.  GB removed 07/2014.    * AKI, progressing.    PLAN   *  If/when able to take po, add questran if stools are still loose.   *  Retrial trickle tube feeds?  Defer to CCM.    *  Consider neuro eval, head CT, EEG for MS changes.     Yesenia Lyons  12/13/2014, 8:27 AM Pager: [phone]      Attending physician's note   I have taken an interval history, reviewed the chart and examined the patient. I agree with the Advanced Practitioner's note, impression and recommendations. Will discontinue tincture of opium as it could be contributing to ileus. Will leave on loperamide for now. Can try resuming TFs at a low rate, defer to CCM.   Lucio Edward, MD [facility] [phone] Mon-Fri 8a-5p [phone] after 5p, weekends, holidays

## 2014-12-13 NOTE — Telephone Encounter (Signed)
I checked EPIC, no records showed she was in [facility] hospital yesterday.   Myrtle, please call pt or her daughter to see how she is doing and what's going on.  Thanks.  Truitt Merle

## 2014-12-14 ENCOUNTER — Telehealth: Payer: Self-pay | Admitting: *Deleted

## 2014-12-14 DIAGNOSIS — I469 Cardiac arrest, cause unspecified: Secondary | ICD-10-CM

## 2014-12-14 NOTE — Progress Notes (Signed)
PULMONARY / CRITICAL CARE MEDICINE   Name: Yesenia Lyons MRN: [id number] DOB: [birth year]    ADMISSION DATE:  12/03/2014 CONSULTATION DATE: 11/27  REFERRING MD :  EDP, DR Billy Fischer  CHIEF COMPLAINT:  Cardiac Arrest   BRIEF SUMMARY:   64 yr old female, h/o carcinoid tumor liver, chronic diarhea, poor intake, PEA arrest at home on toilet with 6 min CPR, hypokalemia on admit.    SUBJECTIVE: Continues to be anuric.Made comfort measures yesterday.  VITAL SIGNS: BP 60/37 mmHg  Pulse 77  Temp(Src) 96.9 F (36.1 C) (Axillary)  Resp 7  Ht 6\' 4"  (1.93 m)  Wt 334 lb 10.5 oz (151.8 kg)  BMI 40.75 kg/m2  SpO2 100%  HEMODYNAMICS:   VENTILATOR SETTINGS:   INTAKE / OUTPUT: I/O last 3 completed shifts: In: 2748.2 [I.V.:2598.2; NG/GT:150] Out: 383 [Urine:183; Stool:200]  PHYSICAL EXAMINATION: General: Awake, nonverbal Neuro: Eyes open, no gross focal deficits, HEENT: Moist mucous membranes Cardiovascular: RRR, no MRG Lungs: Nonlabored, clear. Abdomen:  BS+, old scars, obese, mild diffuse tenderness Musculoskeletal:  Generalized anasarca Skin:  No rash   LABS: No new labs   STUDIES:  Echo 11/27 >> rv hypokinetic, mod dilated, pa 40, LV 50%-55% CT 11/28 >> No evidence of bowel necrosis. Proximal colonic wall thickening which could be ischemic or infectious .6 cm left adrenal mass, favor metastasis over hematoma. History of carcinoid with extensive intra-abdominal adenopathy & hepatic metastatic disease.  CULTURES: [facility] 11/27 >> Corynebacterium cdiff neg 11/27 >> negative  O and P 11/28 >> neg Stool culture / enteric pathogens 11/28 >> neg  ANTIBIOTICS: ceftaz 11/27 >> 11/29 vanc 11/27 >> 11/30 Flagyl 11/27 >> 12/4 Levaquin 11/30 >> 12/4  SIGNIFICANT EVENTS: 11/27 - arrest 56 min , pea , unwitnessed , low K , electrolytes issue 11/28 - levo down, urine output low 11/29 - arf, crt rise, cvp 2, volume given  12/01 - 7 liters pos!!, stool output reduced, extubated  12/03 -  Off levo briefly, midodrine initiated  12/04 - Decreased mental status  LINES/TUBES: 11/27  left IJ >>  11/27  ett >> 12/01 11/27  rt fem a line >> out   ASSESSMENT / PLAN:  PULMONARY A: Acute resp failure - in setting of PEA arrest.  Extubated 12/2.  Low lung volumes, L>R airspace disease.   Left lower lobe ? Mass P:  Supplemental O2 as needed for comfort.   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  Worsening renal failure 12/4. NG Metabolic Acidosis - suspect diarrhea contributing factor, AG 11  Lactic acidosis - post arrest Non-AG  Hypokalemia ATN Now anuric with worsening creatinine. I don't believe she is a good candidate for dialysis. 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 She will be transferred to [facility] today.  Marshell Garfinkel MD [facility] Pager [phone] If no answer or after 3pm call: [phone] 12/14/2014, 9:05 AM

## 2014-12-14 NOTE — Telephone Encounter (Signed)
Message left yest for call back to check on [place] to daughter.  Daughter, [name] called back today & reports that mother was found 12/02/2014 unresponsive & 911 was called & she was resuscitated twice & was doing better in the hospital until the weekend & her BP dropped & kidney function worse.  The plan is to send her to palliative care.  She is presently in room 1 @ [facility].  Informed daughter to call us with any concerns.  She denied any needs at this time.  Information given to Dr Burr Medico.

## 2014-12-14 NOTE — Progress Notes (Signed)
Nutrition Brief Note  Chart reviewed. Pt now transitioning to comfort care.  No further nutrition interventions warranted at this time.  Please re-consult as needed.   Doctor Sheahan RD, LDN, CNSC [phone] Pager [phone] After Hours Pager    

## 2014-12-14 NOTE — Telephone Encounter (Signed)
Call received from patient's family.  "Returning call from [facility] about medical information she found out about my mom."  Call transfered, voicemail received, family agreed to leave message.  This nurse found second MRN: [id number].  Called Service desk to notify of two MRN.  MRN information provided to collaborative nurse.

## 2014-12-15 NOTE — Care Management Important Message (Signed)
Important Message  Patient Details  Name: Yesenia Lyons MRN: [id number] Date of Birth: 04/18/1950   Medicare Important Message Given:  Yes    Lawrence Mitch P Areonna Bran 12/15/2014, 11:44 AM

## 2014-12-15 NOTE — Progress Notes (Signed)
Patient ID: Yesenia Lyons, female   DOB: [birth year], 64 y.o.   MRN: [id number] [facility] PROGRESS NOTE  Amandeep Funkhouser [id number] DOB: 22-Jul-1950 DOA: 11/26/2014 PCP: No primary care provider on file.   Brief narrative:    64 yr old female, h/o carcinoid tumor liver, chronic diarhea, poor intake, PEA arrest at home on toilet with 6 min CPR, hypokalemia on admit.  SIGNIFICANT EVENTS: 11/27 - arrest 56 min, PEA , unwitnessed, low K, electrolytes issue 11/28 - levo down, urine output low 11/29 - arf, crt rise, cvp 2, volume given  12/01 - 7 liters pos!!, stool output reduced, extubated  12/03 - Off levo briefly, midodrine initiated  12/04 - Decreased mental status 12/07 - Transition to comfort care  12/09 - [facility] assumed care from PCCM   Assessment/Plan:    Principal Problem:   Cardiac arrest ([place]) - PEA - S/p PEA arrest - ? 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 Low lung volumes, L>R airspace disease.  - Left lower lobe ? Mass - no further intervention to ensure comfort  - continue supplemental O2 as needed for comfort.     Sepsis with septic shock, unclear source, ? Infectious colitis, present on admission  - in pt who is immunocompromised  - required intubation and pressors, broad spectrum ABX and ICU level of care - with no response to medical therapy and after [place] discussions, pt made DNR - currently off ABX to ensure comfort     Liver cancer carcinoid, metastatic  - extensive, bowel necrosis ruled out - also with transaminitis  - Chronic diarrhea from carcinoid - unable to take anything PO     Acute encephalopathy with elevated ammonia level  - secondary to multiple and complex medical  conditions and complicated hospital course, ? Hepatic encephalopathy  - comfort care as noted above    Hypokalemia with metabolic acidosis  - suspect that diarrhea was contributing and lactic acidosis post cardiac arrest  - no further blood work to ensure comfort     Acute on chronic renal failure, stage III - with progression to ATN post cardiac arrest and septic shock - has remained anuric and determined not to be a candidate for HD - comfort desired  - foley out     Coagulapthy  - in setting of liver carcinoid    Thrombocytopenia  - MODS, liver cancer)- heparin d/c'd 12/1    Morbid obesity  - Body mass index is 42.52 kg/(m^2).    Severe PCM - in the setting of progressive illness - still NPO   DVT prophylaxis - no prophylaxis in an effort to ensure comfort for pt   Code Status: DNR Family Communication:  No family at bedside  Disposition Plan: ? For PCT if we can transfer to residential hospice   IV access:  11/27 left IJ >>  11/27 ett >> 12/01 11/27 rt fem a line >> out  Procedures and diagnostic studies:    Ct Abdomen Pelvis Wo Contrast 12/05/2014  No evidence of bowel necrosis. 2. Proximal colonic wall thickening which could be ischemic or infectious. 3. 6 cm left adrenal mass, favor metastasis over hematoma. 4. History of carcinoid with extensive intra-abdominal adenopathy and hepatic metastatic disease.  Dg Chest Port 1 View 12/07/2014  Stable appearance of the chest since yesterday's study. Persistent left lower lobe atelectasis or pneumonia. Stable cardiomegaly with central pulmonary vascular congestion but without significant interstitial edema.   Dg Chest Port 1 View 12/06/2014 Hypoinflation with persistent left lower lobe atelectasis or infiltrate. Stable enlargement of cardiac silhouette without significant pulmonary vascular congestion.  Dg Chest Port 1 View 12/05/2014  Stable cardiomegaly and low lung volumes. 2. Improved aeration of both lungs  with mild bibasilar atelectasis remaining. 3. The support apparatus is stable.   Dg Chest Portable 1 View 11/20/2014  Interval placement of LEFT central venous line without pneumothorax. 2. Intubated patient with low lung volumes and central venous congestion.   Dg Chest Portable 1 View 11/21/2014  Hypoinflation without acute cardiopulmonary disease. Borderline cardiomegaly. Tubes and lines as described.   Dg Abd Portable 1v 12/12/2014  Mild gaseous distended small bowel loops mid abdomen ? ileus or enteritis.   Dg Abd Portable 1v 12/08/2014  Feeding tube with the tip coiled in the fundus of the stomach.   Medical Consultants:  PCT  Other Consultants:  Nutritionist   IAnti-Infectives:   ceftaz 11/27 >> 11/29 vanc 11/27 >> 11/30 Flagyl 11/27 >> 12/4 Levaquin 11/30 >> 12/4  CULTURES: BC 11/27 >> Corynebacterium cdiff neg 11/27 >> negative  O and P 11/28 >> neg Stool culture / enteric pathogens 11/28 >> neg  Faye Ramsay, MD  [facility] Pager [phone]  If 7PM-7AM, please contact night-coverage [email or web address] Password [facility] 12/15/2014, 8:54 AM   LOS: 11 days   HPI/Subjective: No events overnight.   Objective: Filed Vitals:   12/14/14 1500 12/14/14 1600 12/14/14 1630 12/15/14 0500  BP: 76/56 71/39  71/37  Pulse:      Temp:      TempSrc:      Resp: 7 7 9 6   Height:      Weight:    158.4 kg (349 lb 3.3 oz)  SpO2:   100% 100%    Intake/Output Summary (Last 24 hours) at 12/15/14 0854 Last data filed at 12/15/14 0500  Gross per 24 hour  Intake     70 ml  Output     70 ml  Net      0 ml    Exam:   General:  Pt is non verbal, opens eyes, NAD  Cardiovascular: Regular rate and rhythm  Respiratory: diminished breath sounds at bases   Abdomen: Soft, non tender, non distended  Extremities: diffuse anasarca   Data Reviewed: Basic Metabolic Panel:  Recent Labs Lab 12/10/14 0447 12/10/14 1541 12/11/14 0530 12/12/14 0415 12/13/14 0330  NA 141 139 139  140 140  K 3.2* 3.5 3.2* 3.0* 4.1  CL 106 107 109 107 109  CO2 22 20* 19* 21* 21*  GLUCOSE 135* 154* 177* 237* 269*  BUN 24* 29* 31* 35* 37*  CREATININE 3.36* 3.41* 3.48* 3.74* 3.87*  CALCIUM 6.7* 6.7* 7.0* 7.2* 7.3*  MG  --   --  1.4* 1.8 1.8  PHOS  --   --  4.3 4.5 5.0*   Liver Function Tests:  Recent Labs Lab 12/09/14 0450 12/10/14 0447 12/11/14 0530 12/12/14 0415 12/13/14 0330  AST 685* 377* 220* 145* 121*  ALT 602* 441* 320* 254* 204*  ALKPHOS 646* 680* 687* 715* 734*  BILITOT 3.2* 3.5* 3.9* 3.6* 3.9*  PROT 5.2* 5.2* 5.5* 5.3* 5.6*  ALBUMIN 1.6* 1.6* 1.7* 1.8* 1.8*   No results for input(s): LIPASE, AMYLASE in the last 168  hours.  Recent Labs Lab 12/11/14 1154 12/13/14 0330  AMMONIA 76* 124*   CBC:  Recent Labs Lab 12/09/14 0450 12/10/14 0447 12/11/14 0530 12/12/14 0415 12/13/14 0330  WBC 19.5* 16.4* 22.7* 21.9* 37.3*  NEUTROABS  --   --  16.9* 17.3* 31.7*  HGB 11.1* 10.6* 10.1* 10.2* 11.0*  HCT 34.1* 33.3* 32.0* 33.6* 34.6*  MCV 79.7 80.4 81.2 83.4 83.6  PLT 66* 60* 73* 60* 114*   CBG:  Recent Labs Lab 12/12/14 2039 12/13/14 0024 12/13/14 0333 12/13/14 0744 12/13/14 1137  GLUCAP 197* 228* 226* 240* 243*    Recent Results (from the past 240 hour(s))  Stool culture     Status: None   Collection Time: 12/06/14  9:41 AM  Result Value Ref Range Status   Specimen Description STOOL  Final   Special Requests Immunocompromised  Final   Culture   Final    NO SALMONELLA, SHIGELLA, CAMPYLOBACTER, YERSINIA, OR E.COLI 0157:H7 ISOLATED Performed at [facility]    Report Status 12/10/2014 FINAL  Final  OVA + PARASITE EXAM     Status: None   Collection Time: 12/06/14  9:41 AM  Result Value Ref Range Status   OVA + PARASITE EXAM Final report  Final   Source of Sample STOOL  Final  C difficile quick scan w PCR reflex     Status: None   Collection Time: 12/13/14  2:40 PM  Result Value Ref Range Status   C Diff antigen NEGATIVE NEGATIVE  Final   C Diff toxin NEGATIVE NEGATIVE Final   C Diff interpretation Negative for toxigenic C. difficile  Final   Scheduled Meds:  Continuous Infusions: . sodium chloride 10 mL/hr at 12/14/14 0800

## 2014-12-15 NOTE — Consult Note (Signed)
Palliative care follow-up.  Ms. Walters appears comfortable in bed.  No verbal or non-verbal indications of pain or suffering. Primary RN reports patient as the same.  Daughter in early this AM.  Vital signs lowering, anuric, eminent decline, hospital death expected soon perhaps even hours.  Would not recommend transport out of facility, patient is to fragile and close to death to move.  Kizzie Fantasia, RN-BC, MSN, [facility]

## 2014-12-16 ENCOUNTER — Telehealth: Payer: Self-pay | Admitting: Hematology

## 2014-12-16 NOTE — Discharge Summary (Addendum)
Death Summary  Yesenia Lyons [id number] DOB: 31-Dec-1950 DOA: December 21, 2014  PCP: No primary care provider on file. PCP/Office notified:   Admit date: December 21, 2014 Date of Death: 02-Jan-2015  Brief narrative:    64 yr old female, h/o carcinoid tumor liver, chronic diarhea, poor intake, PEA arrest at home on toilet with 6 min CPR, hypokalemia on admit.  SIGNIFICANT EVENTS: 12/21/22 - arrest 56 min, PEA , unwitnessed, low K, electrolytes issue 11/28 - levo down, urine output low 11/29 - arf, crt rise, cvp 2, volume given  12/01 - 7 liters pos!!, stool output reduced, extubated  12/03 - Off levo briefly, midodrine initiated  12/04 - Decreased mental status 12/07 - Transition to comfort care  01/02/23 - [facility] assumed care from PCCM   Assessment/Plan:    Principal Problem:  Cardiac arrest ([place]) - PEA - S/p PEA arrest - ? 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 Low lung volumes, L>R airspace disease.  - Left lower lobe ? Mass - no further intervention done to ensure comfort    Sepsis with septic shock, unclear source, ? Infectious and ischemic colitis, present on admission - in pt who is immunocompromised  - required intubation and pressors, broad spectrum ABX and ICU level of care - with no response to medical therapy and after [place] discussions, pt made DNR with focus on comfort     Ischemic colitis present and confirmed by CT abd - GI team was consulted   Liver cancer carcinoid, metastatic  - extensive, bowel necrosis ruled out - also with transaminitis  - Chronic diarrhea from carcinoid   Acute encephalopathy with elevated ammonia level  - secondary to multiple and complex medical conditions and complicated hospital course, ? Hepatic  encephalopathy    Hypokalemia with metabolic acidosis  - suspect that diarrhea was contributing and lactic acidosis post cardiac arrest  - no further blood work done to ensure comfort    Acute on chronic renal failure, stage III - with progression to ATN post cardiac arrest and septic shock - has remained anuric and determined not to be a candidate for HD   Coagulapthy  - in setting of liver carcinoid   Thrombocytopenia  - MODS, liver cancer - heparin d/c'd 12/1   Morbid obesity  - Body mass index is 42.52 kg/(m^2).   Severe PCM - in the setting of progressive illness  DVT prophylaxis - no prophylaxis in an effort to ensure comfort for pt   Code Status: DNR  IV access:  2022/12/21 left IJ >>  12/21/22 ett >> 12/01 12/21/2022 rt fem a line >> out  Procedures and diagnostic studies:   Ct Abdomen Pelvis Wo Contrast 12/05/2014 No evidence of bowel necrosis. 2. Proximal colonic wall thickening which could be ischemic or infectious. 3. 6 cm left adrenal mass, favor metastasis over hematoma. 4. History of carcinoid with extensive intra-abdominal adenopathy and hepatic metastatic disease.   Dg Chest Port 1 View 12/07/2014 Stable appearance of the chest since yesterday's study. Persistent left lower lobe atelectasis or pneumonia. Stable cardiomegaly with central pulmonary vascular congestion but without significant interstitial edema.   Dg Chest Port 1 View 12/06/2014 Hypoinflation with persistent left lower lobe atelectasis or infiltrate. Stable enlargement of cardiac silhouette without significant pulmonary vascular congestion.  Dg Chest Port 1 View 12/05/2014 Stable cardiomegaly and low lung volumes. 2. Improved aeration of both lungs with mild bibasilar atelectasis remaining. 3. The support apparatus is stable.   Dg Chest Portable 1 View 11/29/2014 Interval placement of LEFT central venous line without pneumothorax. 2. Intubated patient with low lung volumes and  central venous congestion.   Dg Chest Portable 1 View 12/01/2014 Hypoinflation without acute cardiopulmonary disease. Borderline cardiomegaly. Tubes and lines as described.   Dg Abd Portable 1v 12/12/2014 Mild gaseous distended small bowel loops mid abdomen ? ileus or enteritis.   Dg Abd Portable 1v 12/08/2014 Feeding tube with the tip coiled in the fundus of the stomach.   Medical Consultants:  PCT  Other Consultants:  Nutritionist   IAnti-Infectives:   ceftaz 11/27 >> 11/29 vanc 11/27 >> 11/30 Flagyl 11/27 >> 12/4 Levaquin 11/30 >> 12/4  CULTURES: BC 11/27 >> Corynebacterium cdiff neg 11/27 >> negative  O and P 11/28 >> neg Stool culture / enteric pathogens 11/28 >> neg       Signed:  Faye Ramsay  [facility] 12/22/2014, 12:01 PM  Pager [phone]  If 7PM-7AM, please contact night-coverage [email or web address] Password [email or web address]

## 2014-12-16 NOTE — Progress Notes (Signed)
Patient pronounced dead by 2 RN's Ludwig Clarks and [employer] at [phone]

## 2014-12-16 NOTE — Telephone Encounter (Signed)
Pt's death was noticed. I called her daughter to express my condolences. She knows to call me if there are anything I can do for her.  Truitt Merle 12/09/2014

## 2014-12-16 NOTE — Clinical Social Work Note (Signed)
CSW consult acknowledged:  Clinical Social Worker received a consult for residential hospice placement. CSW has reviewed chart and noted, hospital death is anticipated and palliative does NOT recommend that patient is transferred outside of [facility].  CSW remains available as needed.    Glendon Axe, MSW, [place] [phone] 12/24/2014 12:35 PM

## 2014-12-19 ENCOUNTER — Encounter: Payer: Self-pay | Admitting: Hematology

## 2014-12-19 ENCOUNTER — Ambulatory Visit: Payer: Medicare Other | Admitting: Hematology

## 2015-01-08 DEATH — deceased

## 2015-06-17 ENCOUNTER — Other Ambulatory Visit: Payer: Self-pay | Admitting: Nurse Practitioner

## 2015-11-15 IMAGING — CR DG CHEST 2V
2 series · 2 of 2 positions shown · non-contrast
Comparison: June 03, 2014.

CLINICAL DATA: Sepsis.

EXAM:
CHEST  2 VIEW

[w chest pa]
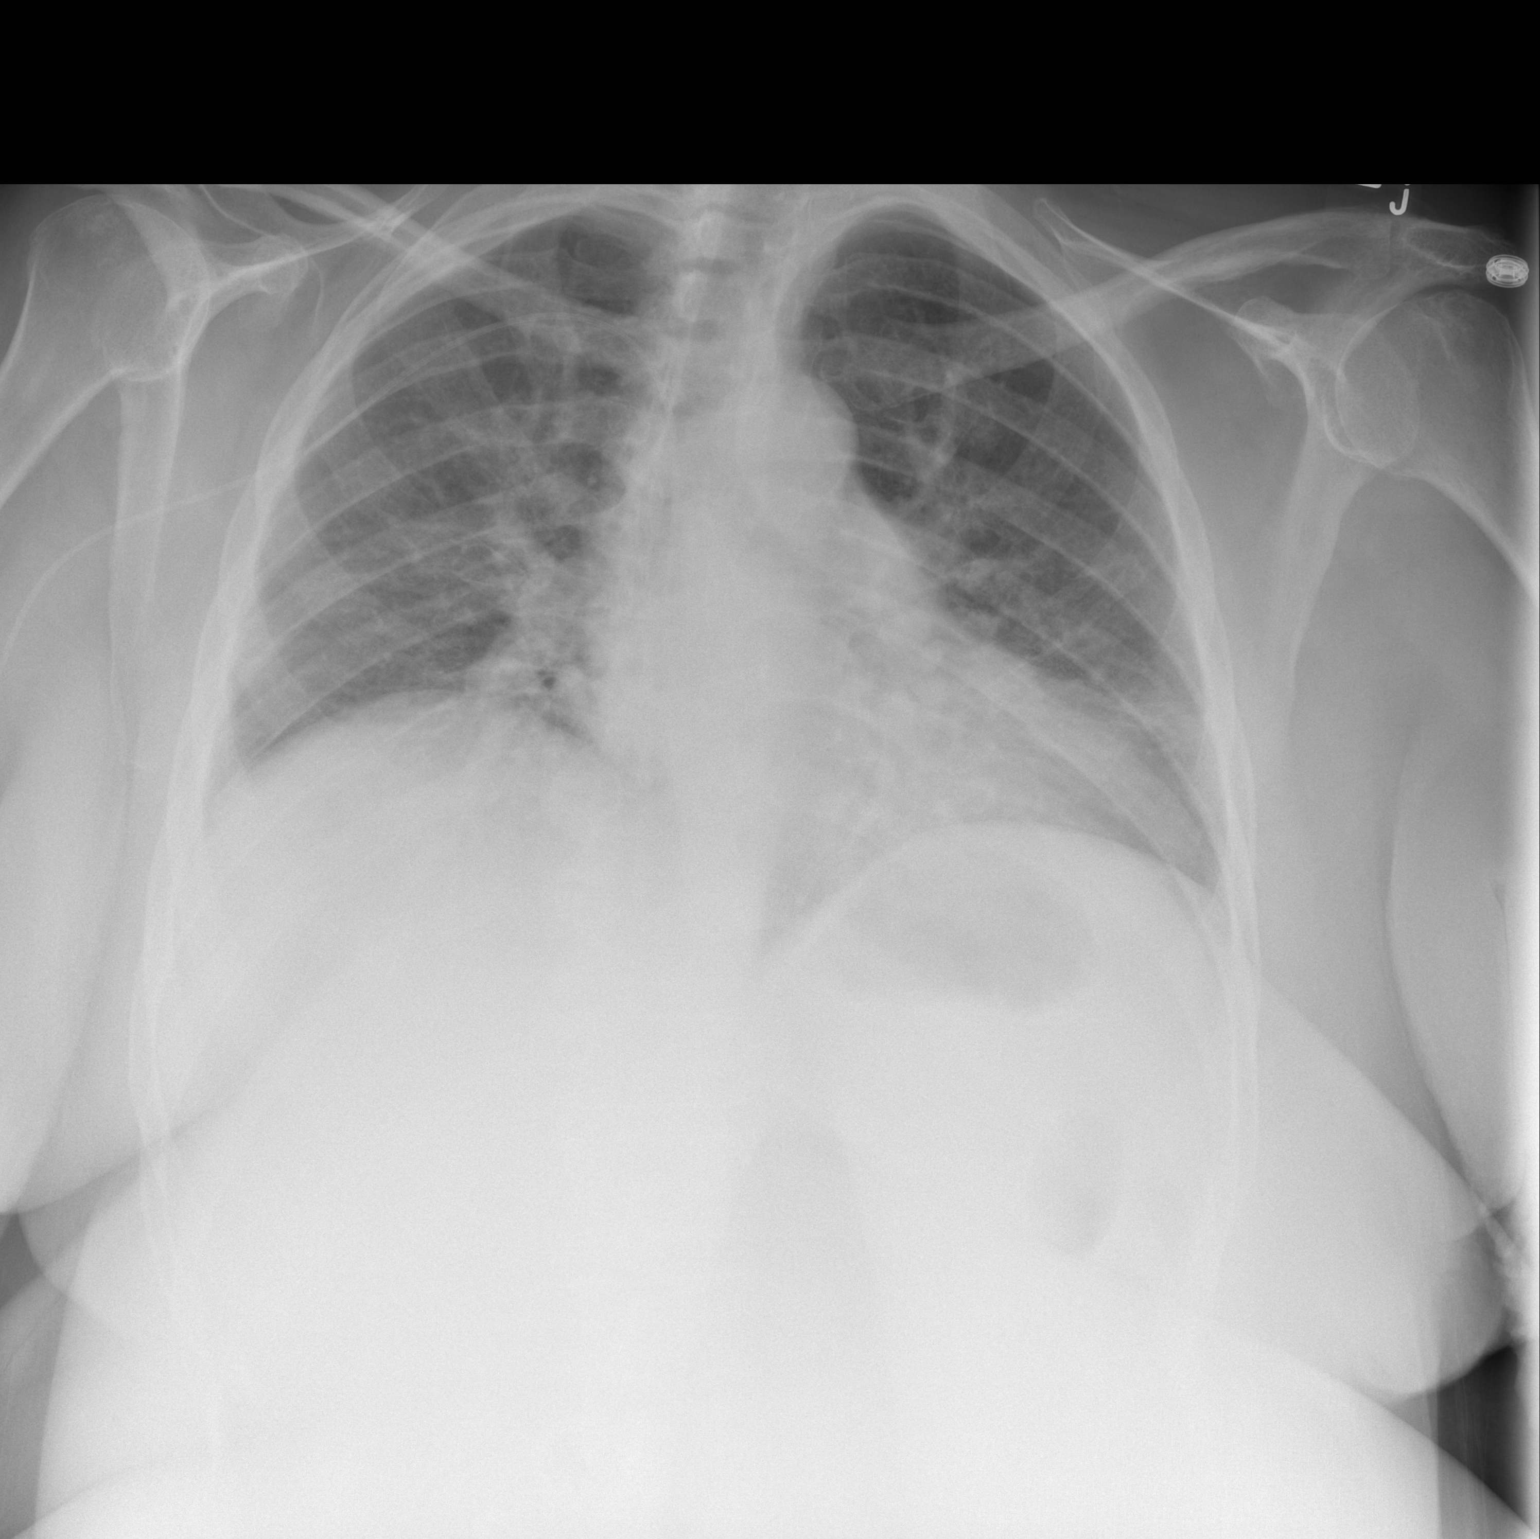

[w chest lat]
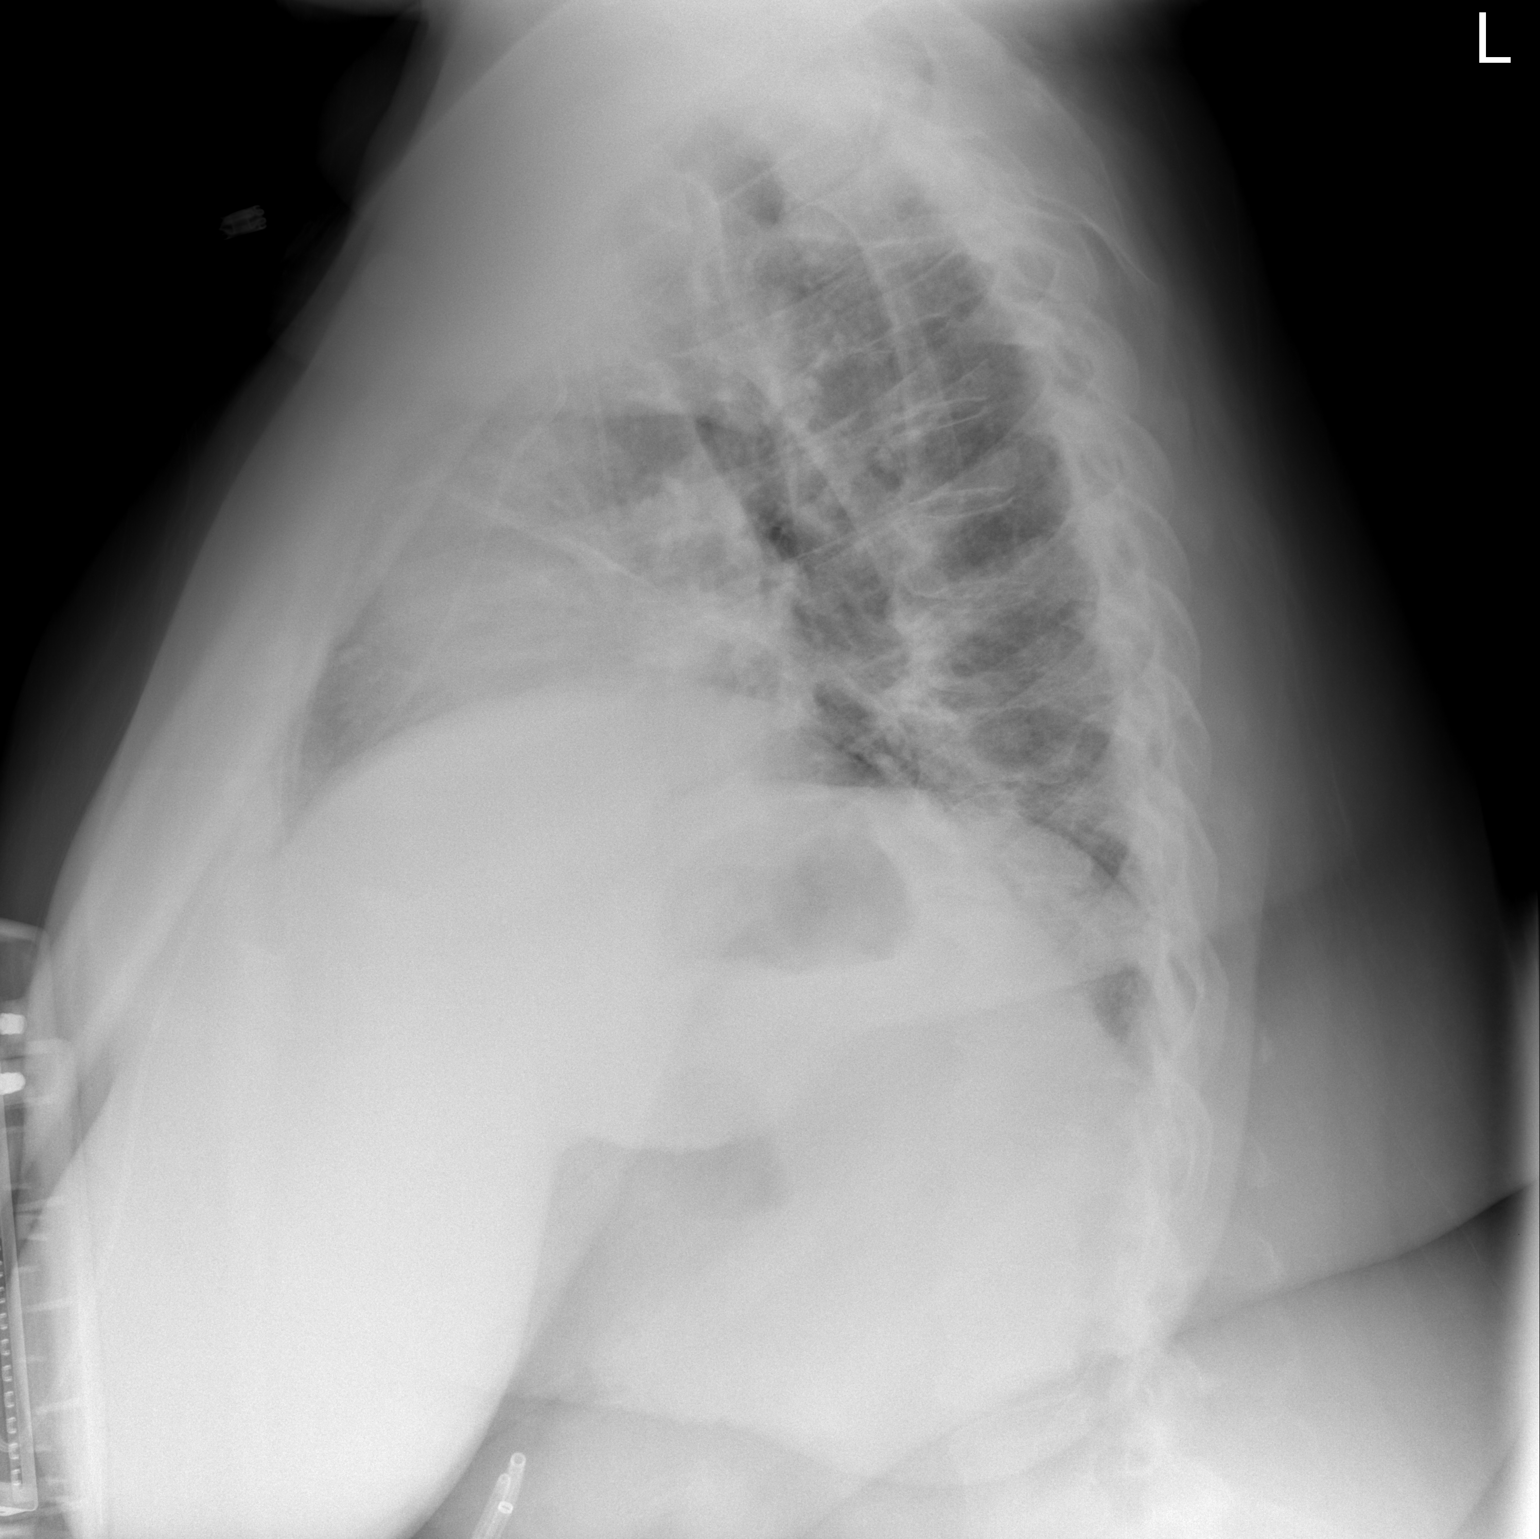

[2 of 2 positions shown; findings below may reference images not displayed]

FINDINGS: Stable cardiomediastinal silhouette. Hypoinflation of the lungs is
noted. No pneumothorax or pleural effusion is noted. Right-sided
PICC line is noted with distal tip in the expected position of the
SVC. Mild central pulmonary vascular congestion is noted. Probable
minimal bibasilar subsegmental atelectasis. Bony thorax is intact.
IMPRESSION: Hypoinflation of the lungs. Mild central pulmonary vascular
congestion. Probable minimal bibasilar subsegmental atelectasis.

## 2016-02-28 ENCOUNTER — Other Ambulatory Visit: Payer: Self-pay | Admitting: Hematology

## 2016-03-03 IMAGING — CR DG CHEST 1V PORT
1 series · 1 of 1 positions shown · non-contrast
Comparison: Portable chest x-ray August 04, 2014

CLINICAL DATA: Status post cardiac arrest, shock, acute and chronic
renal failure, liver malignancy.

EXAM:
PORTABLE CHEST 1 VIEW

[AP]
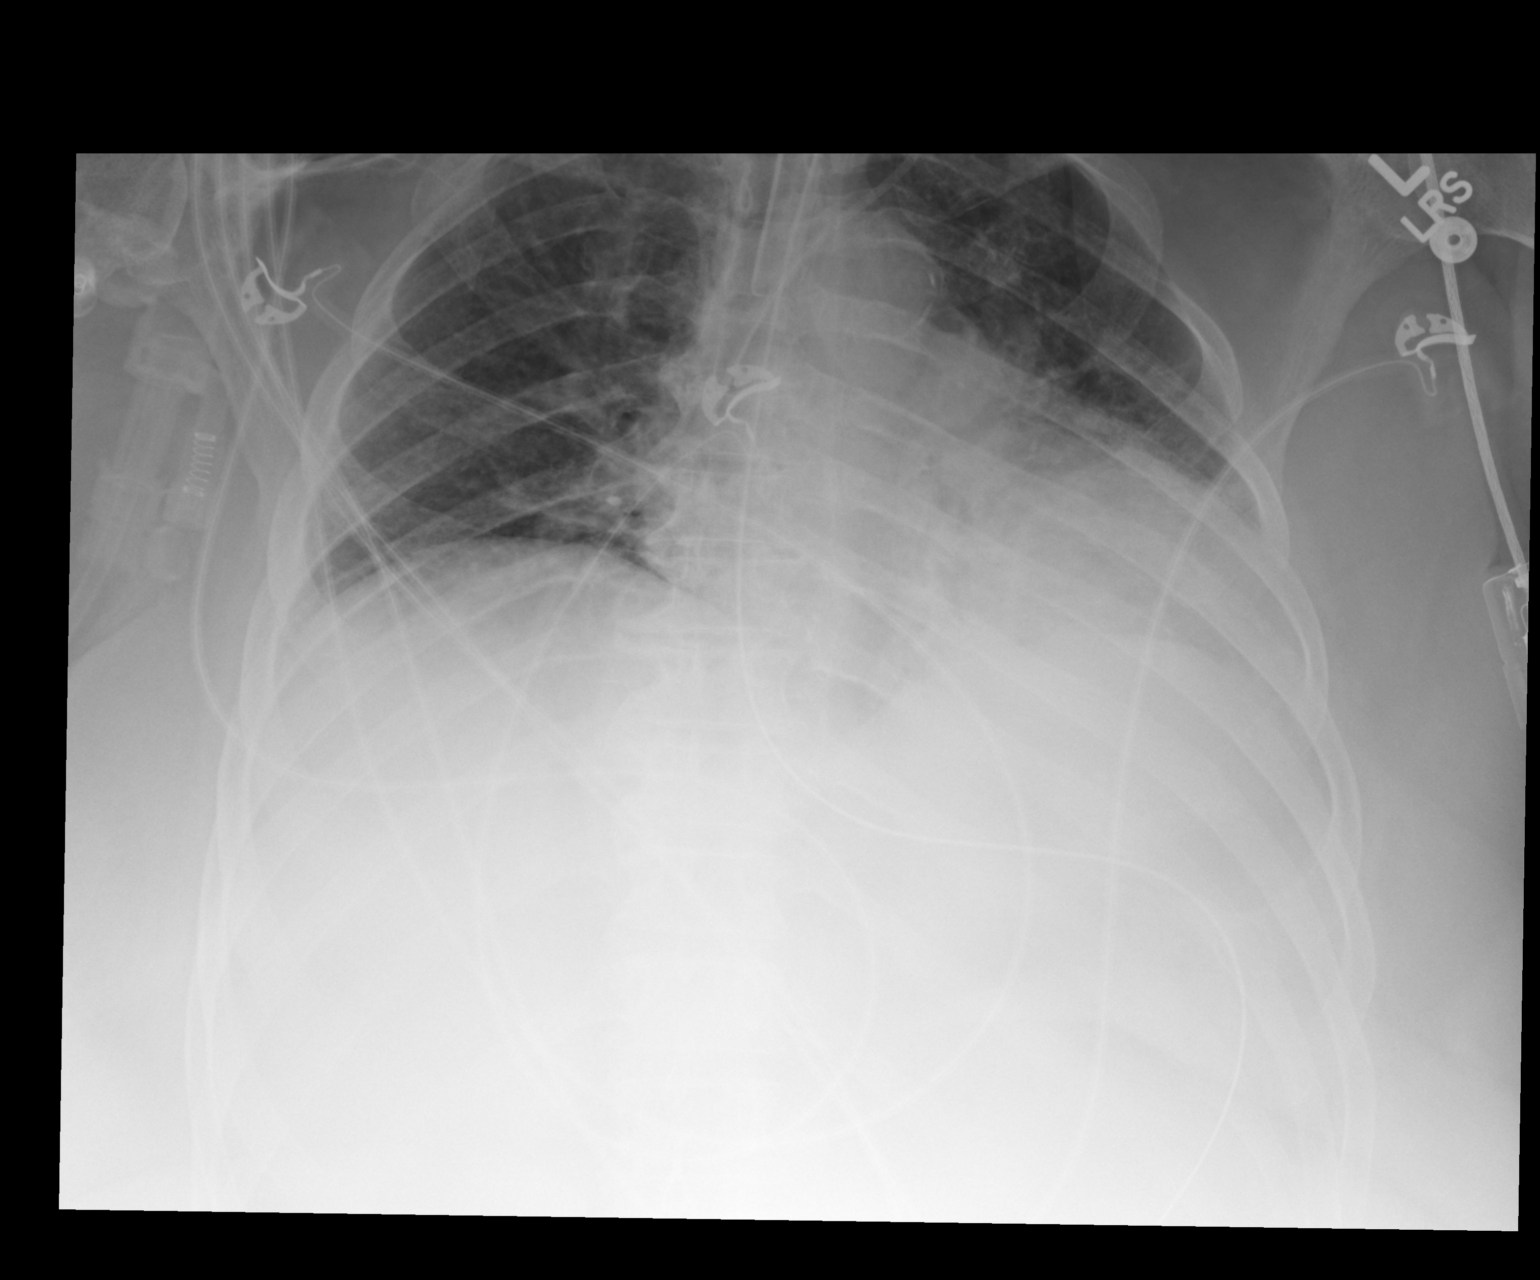

[1 of 1 positions shown; findings below may reference images not displayed]

FINDINGS: The lung volumes remain low. The retrocardiac region on the left
remains dense. There is no large pleural effusion or evidence of a
pneumothorax. The cardiac silhouette is enlarged. The central
pulmonary vascularity is engorged.

The endotracheal tube lies approximately 2.7 cm above the carina.
The esophagogastric tube tip and proximal port project below the
inferior margin of the image.
IMPRESSION: Hypoinflation with persistent left lower lobe atelectasis or
infiltrate. Stable enlargement of cardiac silhouette without
significant pulmonary vascular congestion.

## 2016-03-04 IMAGING — CR DG CHEST 1V PORT
1 series · 1 of 1 positions shown · non-contrast
Comparison: Portable chest x-ray December 06, 2014

CLINICAL DATA: Cardiac arrest, hypertensive shot, acute on chronic
renal failure.

EXAM:
PORTABLE CHEST 1 VIEW

[AP]
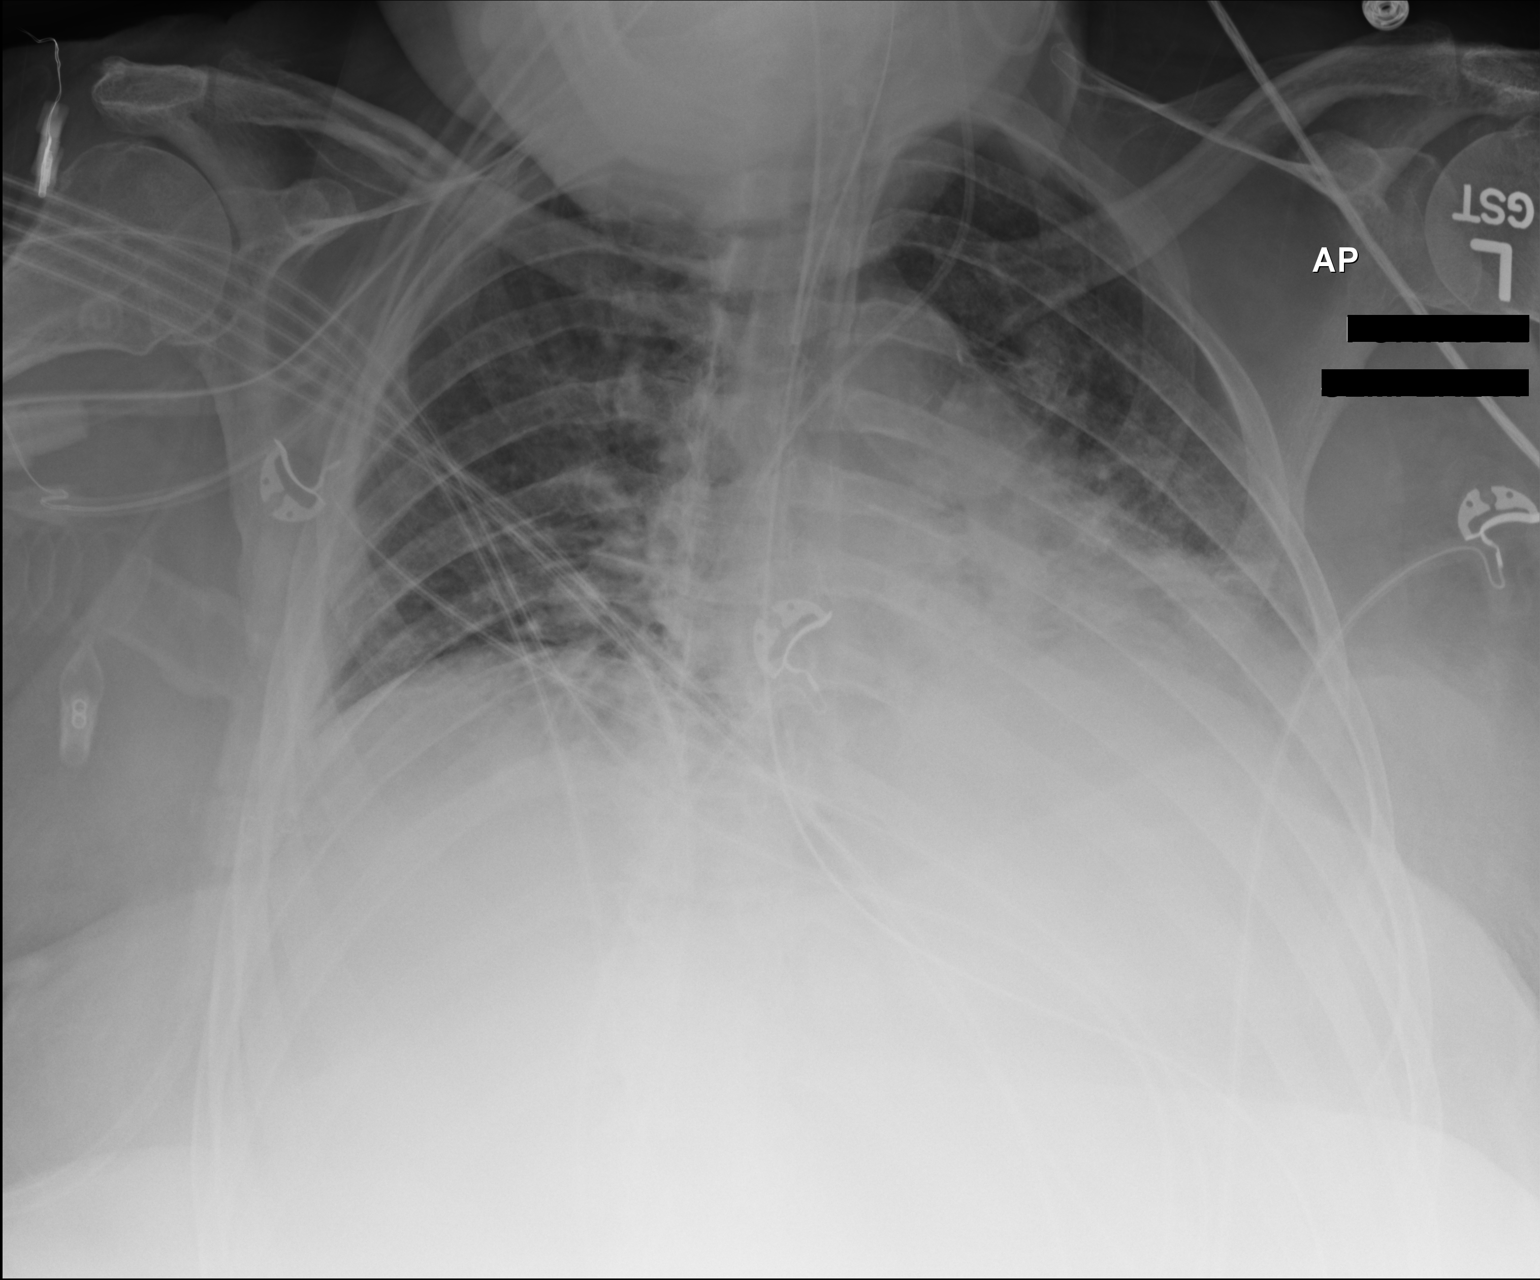

[1 of 1 positions shown; findings below may reference images not displayed]

FINDINGS: The lungs remain hypoinflated. The retrocardiac region on the left
remains dense. The cardiac silhouette remains enlarged and the
central pulmonary vascularity engorged. The endotracheal tube tip
lies 2.8 cm above the carina. The esophagogastric tube tip projects
below the inferior margin of the image. The left internal jugular
venous catheter tip projects over the distal SVC.
IMPRESSION: Stable appearance of the chest since yesterday's study. Persistent
left lower lobe atelectasis or pneumonia. Stable cardiomegaly with
central pulmonary vascular congestion but without significant
interstitial edema. The support tubes are in reasonable position.

## 2016-03-05 IMAGING — CR DG ABD PORTABLE 1V
1 series · 1 of 1 positions shown · non-contrast
Comparison: None.

CLINICAL DATA: Feeding tube placement

EXAM:
PORTABLE ABDOMEN - 1 VIEW

[AP]
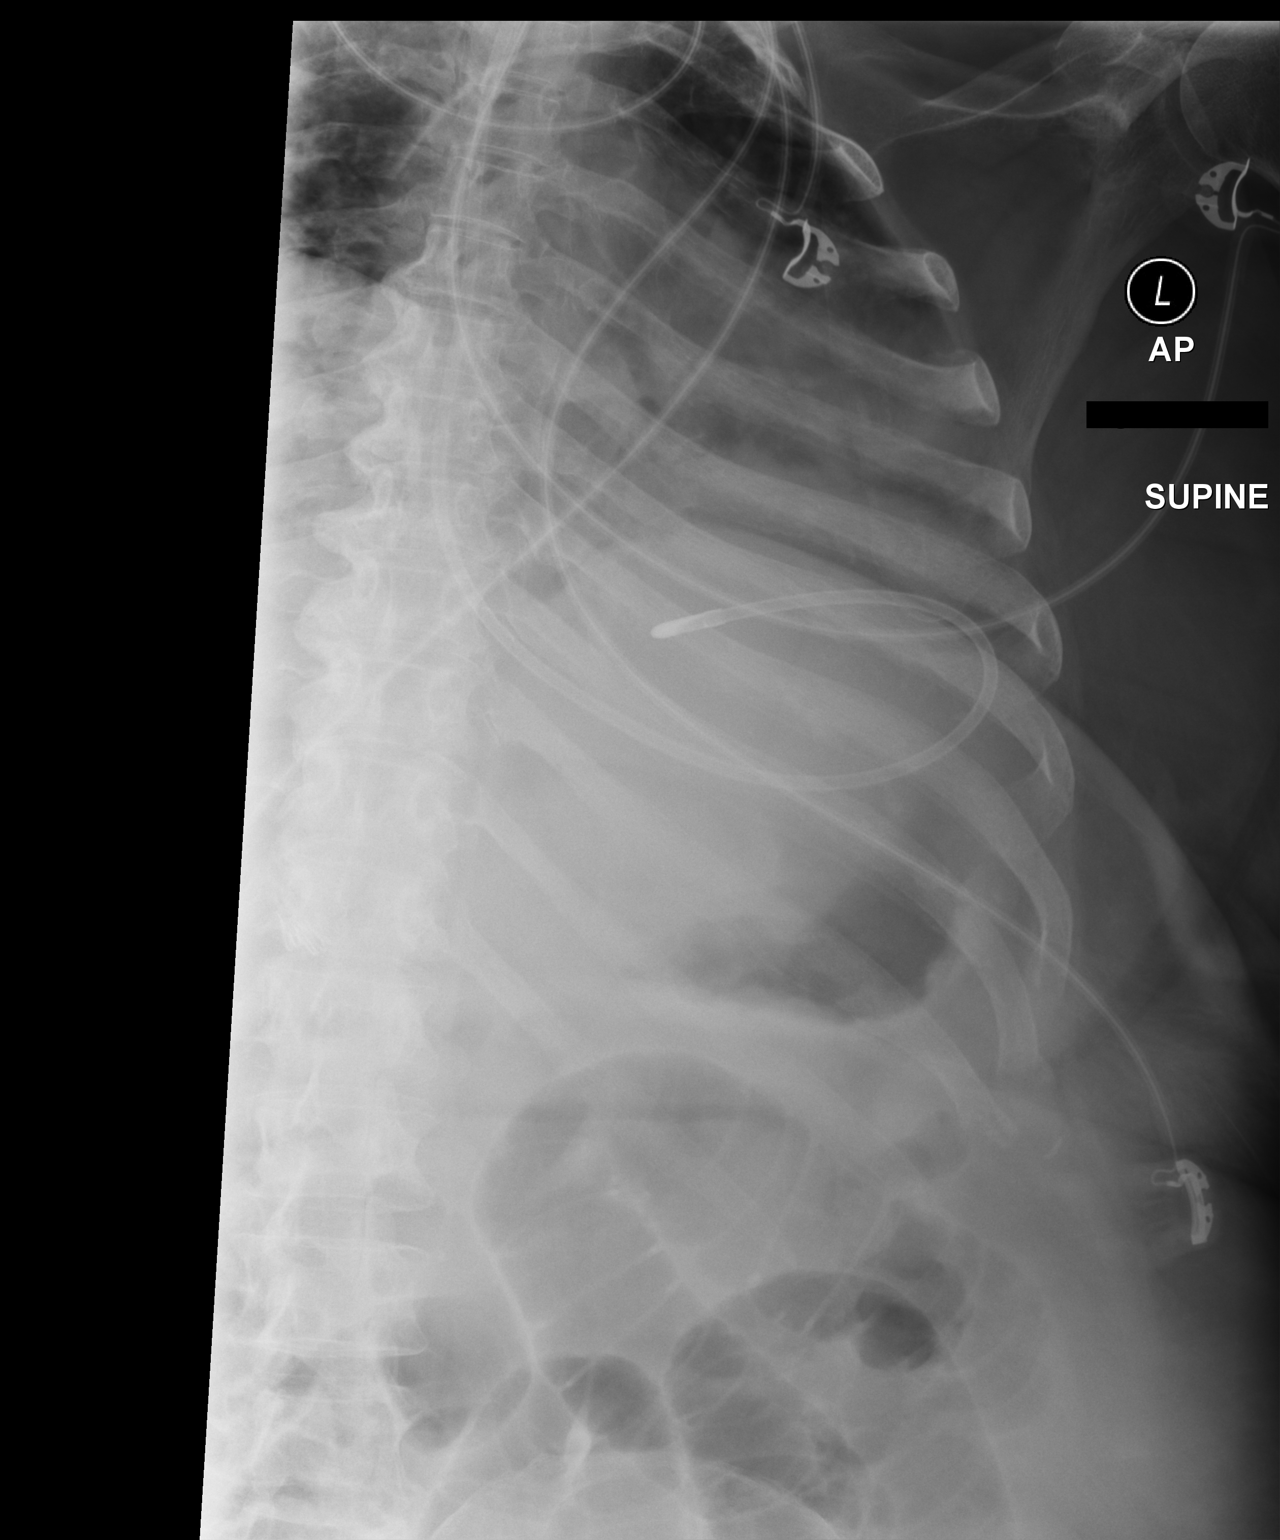

[1 of 1 positions shown; findings below may reference images not displayed]

FINDINGS: Feeding tube with the tip coiled in the fundus of the stomach. There
are multiple distended loops of small bowel. There is no evidence of
pneumoperitoneum, portal venous gas or pneumatosis. There are no
pathologic calcifications along the expected course of the ureters.
The osseous structures are unremarkable.
IMPRESSION: Feeding tube with the tip coiled in the fundus of the stomach.
